# Patient Record
Sex: Female | Born: 1995
Health system: Southern US, Community
[De-identification: ages and names within clinical notes are randomized; demographics above are authoritative.]

## PROBLEM LIST (undated history)

## (undated) DIAGNOSIS — L509 Urticaria, unspecified: Secondary | ICD-10-CM

## (undated) DIAGNOSIS — Z91018 Allergy to other foods: Secondary | ICD-10-CM

## (undated) DIAGNOSIS — E739 Lactose intolerance, unspecified: Secondary | ICD-10-CM

## (undated) DIAGNOSIS — F419 Anxiety disorder, unspecified: Secondary | ICD-10-CM

## (undated) DIAGNOSIS — M549 Dorsalgia, unspecified: Secondary | ICD-10-CM

## (undated) DIAGNOSIS — L709 Acne, unspecified: Secondary | ICD-10-CM

## (undated) DIAGNOSIS — Z8349 Family history of other endocrine, nutritional and metabolic diseases: Secondary | ICD-10-CM

## (undated) DIAGNOSIS — Z803 Family history of malignant neoplasm of breast: Secondary | ICD-10-CM

## (undated) DIAGNOSIS — T783XXA Angioneurotic edema, initial encounter: Secondary | ICD-10-CM

## (undated) DIAGNOSIS — Z8489 Family history of other specified conditions: Secondary | ICD-10-CM

## (undated) DIAGNOSIS — G43909 Migraine, unspecified, not intractable, without status migrainosus: Secondary | ICD-10-CM

## (undated) DIAGNOSIS — Z800 Family history of malignant neoplasm of digestive organs: Secondary | ICD-10-CM

## (undated) DIAGNOSIS — K219 Gastro-esophageal reflux disease without esophagitis: Secondary | ICD-10-CM

## (undated) HISTORY — DX: Lactose intolerance, unspecified: E73.9

## (undated) HISTORY — PX: TONSILLECTOMY: SUR1361

## (undated) HISTORY — DX: Family history of malignant neoplasm of digestive organs: Z80.0

## (undated) HISTORY — DX: Anxiety disorder, unspecified: F41.9

## (undated) HISTORY — DX: Allergy to other foods: Z91.018

## (undated) HISTORY — DX: Dorsalgia, unspecified: M54.9

## (undated) HISTORY — DX: Urticaria, unspecified: L50.9

## (undated) HISTORY — DX: Migraine, unspecified, not intractable, without status migrainosus: G43.909

## (undated) HISTORY — DX: Acne, unspecified: L70.9

## (undated) HISTORY — DX: Family history of other endocrine, nutritional and metabolic diseases: Z83.49

## (undated) HISTORY — DX: Angioneurotic edema, initial encounter: T78.3XXA

## (undated) HISTORY — PX: NO PAST SURGERIES: SHX2092

## (undated) HISTORY — PX: WISDOM TOOTH EXTRACTION: SHX21

---
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
## 2017-01-17 ENCOUNTER — Ambulatory Visit (INDEPENDENT_AMBULATORY_CARE_PROVIDER_SITE_OTHER): Payer: Commercial Managed Care - PPO | Admitting: Physician Assistant

## 2017-01-17 ENCOUNTER — Encounter: Payer: Self-pay | Admitting: Physician Assistant

## 2017-01-17 VITALS — BP 119/76 | HR 103 | Ht 64.0 in | Wt 121.0 lb

## 2017-01-17 DIAGNOSIS — G44229 Chronic tension-type headache, not intractable: Secondary | ICD-10-CM | POA: Diagnosis not present

## 2017-01-17 DIAGNOSIS — H5462 Unqualified visual loss, left eye, normal vision right eye: Secondary | ICD-10-CM | POA: Insufficient documentation

## 2017-01-17 DIAGNOSIS — G43001 Migraine without aura, not intractable, with status migrainosus: Secondary | ICD-10-CM

## 2017-01-17 MED ORDER — RIZATRIPTAN BENZOATE 10 MG PO TABS: 10.0000 mg | ORAL_TABLET | ORAL | 0 refills | Status: DC | PRN

## 2017-01-17 NOTE — Progress Notes (Addendum)
Subjective:    Patient ID: Amanda [name], female    DOB: Dec 29, 1995, 21 y.o.   MRN: [id number]  Headache   Pertinent negatives include no eye pain, numbness, rhinorrhea, sinus pressure or weakness.    Patient is a 21yo female who presents to the clinic for recurrent and persistent headaches 7/7 days a week worsening of the past 1 month.  Patient states the pain is across her forehead and worse over both temples.  Patient denies a bandlike sensation. Patient states her headaches begin around 8am (hour after she wakes up) and continues until she goes to bed, worsening as the day goes on from a 2/10 to 9/10 pain.  She states she has tried ibuprofen and tylenol with minimal relief.  Patient states as the pain increases she experiences lightheadedness.  Patient denies waking up with a headache.  Patient states she has always had trouble sleeping and take melatonin occasionally, which does not seem to effect her headaches.   She states she has been experiencing nausea, no vomiting, with her daily headaches.  She said her appetite has decreased and she is not eating as much as she used to.  Patient denies any sinus congestion, rhinorrhea, PND, fever, or sinus pressure.  Patient states she has been becoming increasingly sensitive to noise and smells.  Denies photophobia.    Patient is concerned because 3 days ago when she was shopping she experienced an episode lasting 5 minutes where she experienced complete loss of vision in her left eye and blurred vision in her right eye.  The episode completely resolved and she felt dizzy for 30 minutes afterward.  Patient states this is the first time something like this has happened.          Review of Systems  HENT: Negative for congestion, postnasal drip, rhinorrhea, sinus pain and sinus pressure.   Eyes: Negative for pain.  Neurological: Positive for headaches. Negative for speech difficulty, weakness, light-headedness and numbness.  All other systems reviewed and  are negative.      Objective:   Physical Exam  Constitutional: She is oriented to person, place, and time. She appears well-developed and well-nourished.  HENT:  Head: Normocephalic and atraumatic.  Eyes: Conjunctivae and EOM are normal. Pupils are equal, round, and reactive to light. Right eye exhibits no discharge. Left eye exhibits no discharge.  Cardiovascular: Normal rate, regular rhythm and normal heart sounds.   Pulmonary/Chest: Effort normal and breath sounds normal.  Neurological: She is alert and oriented to person, place, and time. She has normal strength. She displays normal reflexes. No cranial nerve deficit or sensory deficit. Coordination normal.  Skin: Skin is warm and dry.  Psychiatric: She has a normal mood and affect. Her behavior is normal.  Nursing note and vitals reviewed.         Assessment & Plan:  Marland KitchenMarland KitchenLabarbara was seen today for headache.  Diagnoses and all orders for this visit:  Migraine without aura and with status migrainosus, not intractable -     rizatriptan (MAXALT) 10 MG tablet; Take 1 tablet (10 mg total) by mouth as needed for migraine. May repeat in 2 hours if needed  Chronic tension-type headache, not intractable -     rizatriptan (MAXALT) 10 MG tablet; Take 1 tablet (10 mg total) by mouth as needed for migraine. May repeat in 2 hours if needed    This patient will be referred to neurology due to the persistence of daily headaches and new monocular blindness episode.  Patient given a prescription for a rizatriptan 10mg  to take for severe headaches.  Instructed patient to not take these for daily tension headaches.  Side effects explained.    An order was made for brain MRI with and without contrast due to her persistent daily headaches and new temporary monocular blindness.  Patient can follow-up as needed.

## 2017-01-17 NOTE — Addendum Note (Signed)
Addended by: Donella Stade on: 01/17/2017 04:44 PM   Modules accepted: Orders, Level of Service

## 2017-01-29 ENCOUNTER — Ambulatory Visit (INDEPENDENT_AMBULATORY_CARE_PROVIDER_SITE_OTHER): Payer: Commercial Managed Care - PPO

## 2017-01-29 DIAGNOSIS — G43001 Migraine without aura, not intractable, with status migrainosus: Secondary | ICD-10-CM

## 2017-01-29 MED ORDER — GADOBENATE DIMEGLUMINE 529 MG/ML IV SOLN
10.0000 mL | Freq: Once | INTRAVENOUS | Status: AC | PRN
  Administered 2017-01-29: 10 mL via INTRAVENOUS

## 2017-01-29 NOTE — Progress Notes (Signed)
Call pt: MRI looks great. Do you have neurologist appt yet?

## 2017-02-09 ENCOUNTER — Encounter: Payer: Self-pay | Admitting: Neurology

## 2017-02-09 ENCOUNTER — Ambulatory Visit (INDEPENDENT_AMBULATORY_CARE_PROVIDER_SITE_OTHER): Payer: Commercial Managed Care - PPO | Admitting: Neurology

## 2017-02-09 VITALS — BP 114/66 | HR 93 | Resp 20 | Ht 64.0 in | Wt 121.0 lb

## 2017-02-09 DIAGNOSIS — R51 Headache: Secondary | ICD-10-CM | POA: Diagnosis not present

## 2017-02-09 DIAGNOSIS — G43709 Chronic migraine without aura, not intractable, without status migrainosus: Secondary | ICD-10-CM

## 2017-02-09 DIAGNOSIS — R519 Headache, unspecified: Secondary | ICD-10-CM

## 2017-02-09 MED ORDER — AMITRIPTYLINE HCL 10 MG PO TABS: 10.0000 mg | ORAL_TABLET | Freq: Every day | ORAL | 6 refills | Status: DC

## 2017-02-09 NOTE — Progress Notes (Signed)
[name]    Provider:  Dr Jaynee Eagles Referring Provider: Lavada Mesi Primary Care Physician:  Iran Planas, PA-C  CC: headache  HPI:  Amanda [name] is a 21 y.o. female here as a referral from Dr. Alden Hipp for headaches. She has had them for about a year. No inciting event or head trauma. Mother with chronic migraines. She wakes with headaches which are mild and get worse throughout the day. They are behind the eyes and in the frontal areas and radiates to the temples, shooting pain which is aggravated by activity. They are pulsating, throbbing, with light sensitivity and moreso sound sensitivity. Nause no vomiting. They last all day long. They have been daily for 3-4 months, worsened several months ago. She has tightness and neck pain. She has insomnia, troubling initiating sleep. She tries to drink water during the day but it is difficult because she is so busy. Stress makes the headaches worse. Ibuprofen and OTC meds don't work. No medication overuse. No other focal neurologic deficits, associated symptoms, inciting events or modifiable factors.  Reviewed notes, labs and imaging from outside physicians, which showed:  Personally reviewed MRI of the brain images, normal MRI of the brain with contrast.  Review of Systems: Patient complains of symptoms per HPI as well as the following symptoms: Weight loss,t pain, headache, dizziness, sleepiness,. Pertinent negatives per HPI. All others negative.   Social History   Social History  . Marital status: Single    Spouse name: N/A  . Number of children: N/A  . Years of education: N/A   Occupational History  . Not on file.   Social History Main Topics  . Smoking status: Never Smoker  . Smokeless tobacco: Never Used  . Alcohol use No  . Drug use: No  . Sexual activity: No   Other Topics Concern  . Not on file   Social History Narrative  . No narrative on file    Family History  Problem Relation Age of  Onset  . Migraines Mother   . Skin cancer Father   . Asthma Maternal Grandmother   . Hypertension Maternal Grandmother   . Thyroid disease Maternal Grandmother   . Hypertension Maternal Grandfather   . Hypertension Paternal Grandmother   . Diabetes Paternal Grandmother   . Hypertension Paternal Grandfather   . Diabetes Paternal Grandfather   . Thyroid disease Maternal Aunt     Past Medical History:  Diagnosis Date  . Migraine     Past Surgical History:  Procedure Laterality Date  . NO PAST SURGERIES      Current Outpatient Prescriptions  Medication Sig Dispense Refill  . Adapalene-Benzoyl Peroxide 0.1-2.5 % gel Apply one application to affected areas at bedtime. 45 g 1  . drospirenone-ethinyl estradiol (YAZ,GIANVI,LORYNA) 3-0.02 MG tablet Take 1 tablet by mouth daily. 3 Package 4  . rizatriptan (MAXALT) 10 MG tablet Take 1 tablet (10 mg total) by mouth as needed for migraine. May repeat in 2 hours if needed 10 tablet 0  . amitriptyline (ELAVIL) 10 MG tablet Take 1 tablet (10 mg total) by mouth at bedtime. 30 tablet 6   No current facility-administered medications for this visit.     Allergies as of 02/09/2017 - Review Complete 02/09/2017  Allergen Reaction Noted  . Imitrex [sumatriptan] Swelling 08/15/2016  . Penicillins  03/09/2011    Vitals: BP 114/66   Pulse 93   Resp 20   Ht 5\' 4"  (1.626 m)   Wt 54.9 kg (121 lb)  LMP 01/29/2017   BMI 20.77 kg/m  Last Weight:  Wt Readings from Last 1 Encounters:  02/09/17 54.9 kg (121 lb) (39 %, Z= -0.27)*   * Growth percentiles are based on CDC 2-20 Years data.   Last Height:   Ht Readings from Last 1 Encounters:  02/09/17 5\' 4"  (1.626 m) (46 %, Z= -0.11)*   * Growth percentiles are based on CDC 2-20 Years data.     Physical exam: Exam: Gen: NAD, conversant, well nourised, well groomed                     CV: RRR, no MRG. No Carotid Bruits. No peripheral edema, warm, nontender Eyes: Conjunctivae clear without  exudates or hemorrhage  Neuro: Detailed Neurologic Exam  Speech:    Speech is normal; fluent and spontaneous with normal comprehension.  Cognition:    The patient is oriented to person, place, and time;     recent and remote memory intact;     language fluent;     normal attention, concentration,     fund of knowledge Cranial Nerves:    The pupils are equal, round, and reactive to light. The fundi are normal and spontaneous venous pulsations are present. Visual fields are full to finger confrontation. Extraocular movements are intact. Trigeminal sensation is intact and the muscles of mastication are normal. The face is symmetric. The palate elevates in the midline. Hearing intact. Voice is normal. Shoulder shrug is normal. The tongue has normal motion without fasciculations.   Coordination:    Normal finger to nose and heel to shin. Normal rapid alternating movements.   Gait:    Heel-toe and tandem gait are normal.   Motor Observation:    No asymmetry, no atrophy, and no involuntary movements noted. Tone:    Normal muscle tone.    Posture:    Posture is normal. normal erect    Strength:    Strength is V/V in the upper and lower limbs.      Sensation: intact to LT     Reflex Exam:  DTR's:    Deep tendon reflexes in the upper and lower extremities are normal bilaterally.   Toes:    The toes are downgoing bilaterally.   Clonus:    Clonus is absent.     Assessment/Plan:  Patient with daily migraines and insomnia.   Remember to drink plenty of fluid, eat healthy meals and do not skip any meals. Try to eat protein with a every meal and eat a healthy snack such as fruit or nuts in between meals. Try to keep a regular sleep-wake schedule and try to exercise daily, particularly in the form of walking, 20-30 minutes a day, if you can.   As far as your medications are concerned, I would like to suggest: Amitriptyline at bedtime for migraines and insomnia. We can adjust,  instructed patient to email me in 4 weeks for progress.   Massage and heat for neck pain (she is in school to become a massage therapist)  As far as diagnostic testing: Labs today  Discussed the following:   To prevent or relieve headaches, try the following: Cool Compress. Lie down and place a cool compress on your head.  Avoid headache triggers. If certain foods or odors seem to have triggered your migraines in the past, avoid them. A headache diary might help you identify triggers.  Include physical activity in your daily routine. Try a daily walk or other moderate aerobic exercise.  Manage stress. Find healthy ways to cope with the stressors, such as delegating tasks on your to-do list.  Practice relaxation techniques. Try deep breathing, yoga, massage and visualization.  Eat regularly. Eating regularly scheduled meals and maintaining a healthy diet might help prevent headaches. Also, drink plenty of fluids.  Follow a regular sleep schedule. Sleep deprivation might contribute to headaches Consider biofeedback. With this mind-body technique, you learn to control certain bodily functions - such as muscle tension, heart rate and blood pressure - to prevent headaches or reduce headache pain.    Proceed to emergency room if you experience new or worsening symptoms or symptoms do not resolve, if you have new neurologic symptoms or if headache is severe, or for any concerning symptom.   Cc: Iran Planas, PA-C  Sarina Ill, MD  [facility] [address] [place], [place] [address]  Phone [phone] Fax [phone]

## 2017-02-09 NOTE — Patient Instructions (Signed)
Remember to drink plenty of fluid, eat healthy meals and do not skip any meals. Try to eat protein with a every meal and eat a healthy snack such as fruit or nuts in between meals. Try to keep a regular sleep-wake schedule and try to exercise daily, particularly in the form of walking, 20-30 minutes a day, if you can.   As far as your medications are concerned, I would like to suggest: Amitriptyline at bedtime  As far as diagnostic testing: Labs  I would like to see you back in 4 months, sooner if we need to. Please call us with any interim questions, concerns, problems, updates or refill requests.   Our phone number is [phone]. We also have an after hours call service for urgent matters and there is a physician on-call for urgent questions. For any emergencies you know to call 911 or go to the nearest emergency room  Amitriptyline tablets What is this medicine? AMITRIPTYLINE (a mee TRIP ti leen) is used to treat migraines and headache. This medicine may be used for other purposes; ask your health care provider or pharmacist if you have questions. COMMON BRAND NAME(S): Elavil, Vanatrip What should I tell my health care provider before I take this medicine? They need to know if you have any of these conditions: -an alcohol problem -asthma, difficulty breathing -bipolar disorder or schizophrenia -difficulty passing urine, prostate trouble -glaucoma -heart disease or previous heart attack -liver disease -over active thyroid -seizures -thoughts or plans of suicide, a previous suicide attempt, or family history of suicide attempt -an unusual or allergic reaction to amitriptyline, other medicines, foods, dyes, or preservatives -pregnant or trying to get pregnant -breast-feeding How should I use this medicine? Take this medicine by mouth with a drink of water. Follow the directions on the prescription label. You can take the tablets with or without food. Take your medicine at regular  intervals. Do not take it more often than directed. Do not stop taking this medicine suddenly except upon the advice of your doctor. Stopping this medicine too quickly may cause serious side effects or your condition may worsen. A special MedGuide will be given to you by the pharmacist with each prescription and refill. Be sure to read this information carefully each time. Talk to your pediatrician regarding the use of this medicine in children. Special care may be needed. Overdosage: If you think you have taken too much of this medicine contact a poison control center or emergency room at once. NOTE: This medicine is only for you. Do not share this medicine with others. What if I miss a dose? If you miss a dose, take it as soon as you can. If it is almost time for your next dose, take only that dose. Do not take double or extra doses. What may interact with this medicine? Do not take this medicine with any of the following medications: -arsenic trioxide -certain medicines used to regulate abnormal heartbeat or to treat other heart conditions -cisapride -droperidol -halofantrine -linezolid -MAOIs like Carbex, Eldepryl, Marplan, Nardil, and Parnate -methylene blue -other medicines for mental depression -phenothiazines like perphenazine, thioridazine and chlorpromazine -pimozide -probucol -procarbazine -sparfloxacin -St. John's Wort -ziprasidone This medicine may also interact with the following medications: -atropine and related drugs like hyoscyamine, scopolamine, tolterodine and others -barbiturate medicines for inducing sleep or treating seizures, like phenobarbital -cimetidine -disulfiram -ethchlorvynol -thyroid hormones such as levothyroxine This list may not describe all possible interactions. Give your health care provider a list of all the medicines,  herbs, non-prescription drugs, or dietary supplements you use. Also tell them if you smoke, drink alcohol, or use illegal drugs.  Some items may interact with your medicine. What should I watch for while using this medicine? Tell your doctor if your symptoms do not get better or if they get worse. Visit your doctor or health care professional for regular checks on your progress. Because it may take several weeks to see the full effects of this medicine, it is important to continue your treatment as prescribed by your doctor. Patients and their families should watch out for new or worsening thoughts of suicide or depression. Also watch out for sudden changes in feelings such as feeling anxious, agitated, panicky, irritable, hostile, aggressive, impulsive, severely restless, overly excited and hyperactive, or not being able to sleep. If this happens, especially at the beginning of treatment or after a change in dose, call your health care professional. Dennis Bast may get drowsy or dizzy. Do not drive, use machinery, or do anything that needs mental alertness until you know how this medicine affects you. Do not stand or sit up quickly, especially if you are an older patient. This reduces the risk of dizzy or fainting spells. Alcohol may interfere with the effect of this medicine. Avoid alcoholic drinks. Do not treat yourself for coughs, colds, or allergies without asking your doctor or health care professional for advice. Some ingredients can increase possible side effects. Your mouth may get dry. Chewing sugarless gum or sucking hard candy, and drinking plenty of water will help. Contact your doctor if the problem does not go away or is severe. This medicine may cause dry eyes and blurred vision. If you wear contact lenses you may feel some discomfort. Lubricating drops may help. See your eye doctor if the problem does not go away or is severe. This medicine can cause constipation. Try to have a bowel movement at least every 2 to 3 days. If you do not have a bowel movement for 3 days, call your doctor or health care professional. This medicine  can make you more sensitive to the sun. Keep out of the sun. If you cannot avoid being in the sun, wear protective clothing and use sunscreen. Do not use sun lamps or tanning beds/booths. What side effects may I notice from receiving this medicine? Side effects that you should report to your doctor or health care professional as soon as possible: -allergic reactions like skin rash, itching or hives, swelling of the face, lips, or tongue -anxious -breathing problems -changes in vision -confusion -elevated mood, decreased need for sleep, racing thoughts, impulsive behavior -eye pain -fast, irregular heartbeat -feeling faint or lightheaded, falls -feeling agitated, angry, or irritable -fever with increased sweating -hallucination, loss of contact with reality -seizures -stiff muscles -suicidal thoughts or other mood changes -tingling, pain, or numbness in the feet or hands -trouble passing urine or change in the amount of urine -trouble sleeping -unusually weak or tired -vomiting -yellowing of the eyes or skin Side effects that usually do not require medical attention (report to your doctor or health care professional if they continue or are bothersome): -change in sex drive or performance -change in appetite or weight -constipation -dizziness -dry mouth -nausea -tired -tremors -upset stomach This list may not describe all possible side effects. Call your doctor for medical advice about side effects. You may report side effects to FDA at 1-800-FDA-1088. Where should I keep my medicine? Keep out of the reach of children. Store at room temperature between  20 and 25 degrees C (68 and 77 degrees F). Throw away any unused medicine after the expiration date. NOTE: This sheet is a summary. It may not cover all possible information. If you have questions about this medicine, talk to your doctor, pharmacist, or health care provider.  2017 Elsevier/Gold Standard (2016-05-05 12:14:15)

## 2017-02-10 LAB — COMPREHENSIVE METABOLIC PANEL
A/G RATIO: 1.9 (ref 1.2–2.2)
ALT: 6 IU/L (ref 0–32)
AST: 12 IU/L (ref 0–40)
Albumin: 4.3 g/dL (ref 3.5–5.5)
Alkaline Phosphatase: 39 IU/L — ABNORMAL LOW (ref 43–101)
BUN/Creatinine Ratio: 15 (ref 9–23)
BUN: 10 mg/dL (ref 6–20)
Bilirubin Total: 0.4 mg/dL (ref 0.0–1.2)
CALCIUM: 9.3 mg/dL (ref 8.7–10.2)
CO2: 24 mmol/L (ref 18–29)
Chloride: 104 mmol/L (ref 96–106)
Creatinine, Ser: 0.68 mg/dL (ref 0.57–1.00)
GFR, EST AFRICAN AMERICAN: 148 mL/min/{1.73_m2} (ref >59)
GFR, EST NON AFRICAN AMERICAN: 128 mL/min/{1.73_m2} (ref >59)
GLOBULIN, TOTAL: 2.3 g/dL (ref 1.5–4.5)
Glucose: 78 mg/dL (ref 65–99)
POTASSIUM: 4.4 mmol/L (ref 3.5–5.2)
SODIUM: 142 mmol/L (ref 134–144)
TOTAL PROTEIN: 6.6 g/dL (ref 6.0–8.5)

## 2017-02-10 LAB — CBC
HEMOGLOBIN: 12.9 g/dL (ref 11.1–15.9)
Hematocrit: 38.7 % (ref 34.0–46.6)
MCH: 29.5 pg (ref 26.6–33.0)
MCHC: 33.3 g/dL (ref 31.5–35.7)
MCV: 88 fL (ref 79–97)
PLATELETS: 216 10*3/uL (ref 150–379)
RBC: 4.38 x10E6/uL (ref 3.77–5.28)
RDW: 13 % (ref 12.3–15.4)
WBC: 4.1 10*3/uL (ref 3.4–10.8)

## 2017-02-11 ENCOUNTER — Encounter: Payer: Self-pay | Admitting: Neurology

## 2017-02-11 DIAGNOSIS — G43709 Chronic migraine without aura, not intractable, without status migrainosus: Secondary | ICD-10-CM | POA: Insufficient documentation

## 2017-02-11 DIAGNOSIS — G43711 Chronic migraine without aura, intractable, with status migrainosus: Secondary | ICD-10-CM | POA: Insufficient documentation

## 2017-02-13 ENCOUNTER — Ambulatory Visit: Payer: Commercial Managed Care - PPO | Admitting: Neurology

## 2017-02-14 ENCOUNTER — Telehealth: Payer: Self-pay | Admitting: *Deleted

## 2017-02-14 NOTE — Telephone Encounter (Signed)
Per Dr Jaynee Eagles, LVM informing patient her labs look fine. Left number for any questions.

## 2017-04-16 ENCOUNTER — Encounter: Payer: Self-pay | Admitting: Physician Assistant

## 2017-04-16 ENCOUNTER — Ambulatory Visit (INDEPENDENT_AMBULATORY_CARE_PROVIDER_SITE_OTHER): Payer: Commercial Managed Care - PPO

## 2017-04-16 ENCOUNTER — Ambulatory Visit (INDEPENDENT_AMBULATORY_CARE_PROVIDER_SITE_OTHER): Payer: Commercial Managed Care - PPO | Admitting: Physician Assistant

## 2017-04-16 VITALS — BP 115/75 | HR 103 | Ht 64.0 in | Wt 128.0 lb

## 2017-04-16 DIAGNOSIS — R229 Localized swelling, mass and lump, unspecified: Secondary | ICD-10-CM

## 2017-04-16 DIAGNOSIS — R221 Localized swelling, mass and lump, neck: Secondary | ICD-10-CM

## 2017-04-16 NOTE — Progress Notes (Signed)
   Subjective:    Patient ID: Amanda [name], female    DOB: 21-Jun-1996, 21 y.o.   MRN: [id number]  HPI Pt is a 21 yo female who presents to the clinic with lump on the back of her neck left side. Noticed for last month. Seemed to be bigger when she first noticed it. Lately seems to be causing more pain with ROM of neck and laying down. No cough, fever, chills, n/v/d. Thought it was a pimple at first but then never came to a head.    Review of Systems  All other systems reviewed and are negative.      Objective:   Physical Exam  Constitutional: She appears well-developed and well-nourished.  Skin:  Pea sized nodule left side of neck just under hairline. Slightly tender with deep palpation.  No swelling, redness, warmth. Can not see lump only feel it right under the skin. Very mobile.   Psychiatric: She has a normal mood and affect. Her behavior is normal.          Assessment & Plan:  Marland KitchenMarland KitchenDiagnoses and all orders for this visit:  Skin nodule -     US SOFT TISSUE HEAD AND NECK; Future -     US SOFT TISSUE HEAD AND NECK   Unclear etiology lymph node vs cyst vs lipoma. Reassured patient I do not feel like anything pathological. Encouraged cool compresses, ibuprofen and keeping her hands off it.  Follow up as needed.

## 2017-05-10 ENCOUNTER — Ambulatory Visit (INDEPENDENT_AMBULATORY_CARE_PROVIDER_SITE_OTHER): Payer: Commercial Managed Care - PPO | Admitting: Neurology

## 2017-05-10 ENCOUNTER — Encounter: Payer: Self-pay | Admitting: Neurology

## 2017-05-10 VITALS — BP 103/56 | HR 88 | Ht 64.0 in | Wt 128.4 lb

## 2017-05-10 DIAGNOSIS — G43009 Migraine without aura, not intractable, without status migrainosus: Secondary | ICD-10-CM

## 2017-05-10 MED ORDER — ONDANSETRON 4 MG PO TBDP: 4.0000 mg | ORAL_TABLET | Freq: Three times a day (TID) | ORAL | 6 refills | Status: DC | PRN

## 2017-05-10 MED ORDER — AMITRIPTYLINE HCL 25 MG PO TABS: 25.0000 mg | ORAL_TABLET | Freq: Every day | ORAL | 12 refills | Status: DC

## 2017-05-10 MED ORDER — KETOROLAC TROMETHAMINE 60 MG/2ML IM SOLN
60.0000 mg | Freq: Once | INTRAMUSCULAR | Status: AC
  Administered 2017-05-10: 60 mg via INTRAMUSCULAR

## 2017-05-10 NOTE — Patient Instructions (Addendum)
Overall you are doing fairly well but I do want to suggest a few things today:   Remember to drink plenty of fluid, eat healthy meals and do not skip any meals. Try to eat protein with a every meal and eat a healthy snack such as fruit or nuts in between meals. Try to keep a regular sleep-wake schedule and try to exercise daily, particularly in the form of walking, 20-30 minutes a day, if you can.   As far as your medications are concerned, I would like to suggest:  Increase amitriptyline to 25 mg at bedtime At onset of headache take Maxalt and Zofran. May repeat once in 2 hours if needed.  Our phone number is [phone]. We also have an after hours call service for urgent matters and there is a physician on-call for urgent questions. For any emergencies you know to call 911 or go to the nearest emergency room Amitriptyline tablets What is this medicine? AMITRIPTYLINE (a mee TRIP ti leen) is used to treat depression. This medicine may be used for other purposes; ask your health care provider or pharmacist if you have questions. COMMON BRAND NAME(S): Elavil, Vanatrip What should I tell my health care provider before I take this medicine? They need to know if you have any of these conditions: -an alcohol problem -asthma, difficulty breathing -bipolar disorder or schizophrenia -difficulty passing urine, prostate trouble -glaucoma -heart disease or previous heart attack -liver disease -over active thyroid -seizures -thoughts or plans of suicide, a previous suicide attempt, or family history of suicide attempt -an unusual or allergic reaction to amitriptyline, other medicines, foods, dyes, or preservatives -pregnant or trying to get pregnant -breast-feeding How should I use this medicine? Take this medicine by mouth with a drink of water. Follow the directions on the prescription label. You can take the tablets with or without food. Take your medicine at regular intervals. Do not take it  more often than directed. Do not stop taking this medicine suddenly except upon the advice of your doctor. Stopping this medicine too quickly may cause serious side effects or your condition may worsen. A special MedGuide will be given to you by the pharmacist with each prescription and refill. Be sure to read this information carefully each time. Talk to your pediatrician regarding the use of this medicine in children. Special care may be needed. Overdosage: If you think you have taken too much of this medicine contact a poison control center or emergency room at once. NOTE: This medicine is only for you. Do not share this medicine with others. What if I miss a dose? If you miss a dose, take it as soon as you can. If it is almost time for your next dose, take only that dose. Do not take double or extra doses. What may interact with this medicine? Do not take this medicine with any of the following medications: -arsenic trioxide -certain medicines used to regulate abnormal heartbeat or to treat other heart conditions -cisapride -droperidol -halofantrine -linezolid -MAOIs like Carbex, Eldepryl, Marplan, Nardil, and Parnate -methylene blue -other medicines for mental depression -phenothiazines like perphenazine, thioridazine and chlorpromazine -pimozide -probucol -procarbazine -sparfloxacin -St. John's Wort -ziprasidone This medicine may also interact with the following medications: -atropine and related drugs like hyoscyamine, scopolamine, tolterodine and others -barbiturate medicines for inducing sleep or treating seizures, like phenobarbital -cimetidine -disulfiram -ethchlorvynol -thyroid hormones such as levothyroxine This list may not describe all possible interactions. Give your health care provider a list of all the medicines, herbs,  non-prescription drugs, or dietary supplements you use. Also tell them if you smoke, drink alcohol, or use illegal drugs. Some items may interact  with your medicine. What should I watch for while using this medicine? Tell your doctor if your symptoms do not get better or if they get worse. Visit your doctor or health care professional for regular checks on your progress. Because it may take several weeks to see the full effects of this medicine, it is important to continue your treatment as prescribed by your doctor. Patients and their families should watch out for new or worsening thoughts of suicide or depression. Also watch out for sudden changes in feelings such as feeling anxious, agitated, panicky, irritable, hostile, aggressive, impulsive, severely restless, overly excited and hyperactive, or not being able to sleep. If this happens, especially at the beginning of treatment or after a change in dose, call your health care professional. Dennis Bast may get drowsy or dizzy. Do not drive, use machinery, or do anything that needs mental alertness until you know how this medicine affects you. Do not stand or sit up quickly, especially if you are an older patient. This reduces the risk of dizzy or fainting spells. Alcohol may interfere with the effect of this medicine. Avoid alcoholic drinks. Do not treat yourself for coughs, colds, or allergies without asking your doctor or health care professional for advice. Some ingredients can increase possible side effects. Your mouth may get dry. Chewing sugarless gum or sucking hard candy, and drinking plenty of water will help. Contact your doctor if the problem does not go away or is severe. This medicine may cause dry eyes and blurred vision. If you wear contact lenses you may feel some discomfort. Lubricating drops may help. See your eye doctor if the problem does not go away or is severe. This medicine can cause constipation. Try to have a bowel movement at least every 2 to 3 days. If you do not have a bowel movement for 3 days, call your doctor or health care professional. This medicine can make you more  sensitive to the sun. Keep out of the sun. If you cannot avoid being in the sun, wear protective clothing and use sunscreen. Do not use sun lamps or tanning beds/booths. What side effects may I notice from receiving this medicine? Side effects that you should report to your doctor or health care professional as soon as possible: -allergic reactions like skin rash, itching or hives, swelling of the face, lips, or tongue -anxious -breathing problems -changes in vision -confusion -elevated mood, decreased need for sleep, racing thoughts, impulsive behavior -eye pain -fast, irregular heartbeat -feeling faint or lightheaded, falls -feeling agitated, angry, or irritable -fever with increased sweating -hallucination, loss of contact with reality -seizures -stiff muscles -suicidal thoughts or other mood changes -tingling, pain, or numbness in the feet or hands -trouble passing urine or change in the amount of urine -trouble sleeping -unusually weak or tired -vomiting -yellowing of the eyes or skin Side effects that usually do not require medical attention (report to your doctor or health care professional if they continue or are bothersome): -change in sex drive or performance -change in appetite or weight -constipation -dizziness -dry mouth -nausea -tired -tremors -upset stomach This list may not describe all possible side effects. Call your doctor for medical advice about side effects. You may report side effects to FDA at 1-800-FDA-1088. Where should I keep my medicine? Keep out of the reach of children. Store at room temperature between 20  and 25 degrees C (68 and 77 degrees F). Throw away any unused medicine after the expiration date. NOTE: This sheet is a summary. It may not cover all possible information. If you have questions about this medicine, talk to your doctor, pharmacist, or health care provider.  2018 [employer] Standard (2016-05-05 12:14:15)  Ondansetron  tablets What is this medicine? ONDANSETRON (on DAN se tron) is used to treat nausea and vomiting caused by chemotherapy. It is also used to prevent or treat nausea and vomiting after surgery. This medicine may be used for other purposes; ask your health care provider or pharmacist if you have questions. COMMON BRAND NAME(S): Zofran What should I tell my health care provider before I take this medicine? They need to know if you have any of these conditions: -heart disease -history of irregular heartbeat -liver disease -low levels of magnesium or potassium in the blood -an unusual or allergic reaction to ondansetron, granisetron, other medicines, foods, dyes, or preservatives -pregnant or trying to get pregnant -breast-feeding How should I use this medicine? Take this medicine by mouth with a glass of water. Follow the directions on your prescription label. Take your doses at regular intervals. Do not take your medicine more often than directed. Talk to your pediatrician regarding the use of this medicine in children. Special care may be needed. Overdosage: If you think you have taken too much of this medicine contact a poison control center or emergency room at once. NOTE: This medicine is only for you. Do not share this medicine with others. What if I miss a dose? If you miss a dose, take it as soon as you can. If it is almost time for your next dose, take only that dose. Do not take double or extra doses. What may interact with this medicine? Do not take this medicine with any of the following medications: -apomorphine -certain medicines for fungal infections like fluconazole, itraconazole, ketoconazole, posaconazole, voriconazole -cisapride -dofetilide -dronedarone -pimozide -thioridazine -ziprasidone This medicine may also interact with the following medications: -carbamazepine -certain medicines for depression, anxiety, or psychotic disturbances -fentanyl -linezolid -MAOIs  like Carbex, Eldepryl, Marplan, Nardil, and Parnate -methylene blue (injected into a vein) -other medicines that prolong the QT interval (cause an abnormal heart rhythm) -phenytoin -rifampicin -tramadol This list may not describe all possible interactions. Give your health care provider a list of all the medicines, herbs, non-prescription drugs, or dietary supplements you use. Also tell them if you smoke, drink alcohol, or use illegal drugs. Some items may interact with your medicine. What should I watch for while using this medicine? Check with your doctor or health care professional right away if you have any sign of an allergic reaction. What side effects may I notice from receiving this medicine? Side effects that you should report to your doctor or health care professional as soon as possible: -allergic reactions like skin rash, itching or hives, swelling of the face, lips or tongue -breathing problems -confusion -dizziness -fast or irregular heartbeat -feeling faint or lightheaded, falls -fever and chills -loss of balance or coordination -seizures -sweating -swelling of the hands or feet -tightness in the chest -tremors -unusually weak or tired Side effects that usually do not require medical attention (report to your doctor or health care professional if they continue or are bothersome): -constipation or diarrhea -headache This list may not describe all possible side effects. Call your doctor for medical advice about side effects. You may report side effects to FDA at 1-800-FDA-1088. Where should I keep  my medicine? Keep out of the reach of children. Store between 2 and 30 degrees C (36 and 86 degrees F). Throw away any unused medicine after the expiration date. NOTE: This sheet is a summary. It may not cover all possible information. If you have questions about this medicine, talk to your doctor, pharmacist, or health care provider.  2018 [employer]/Gold Standard (2013-09-10  16:27:45)  Rizatriptan tablets What is this medicine? RIZATRIPTAN (rye za TRIP tan) is used to treat migraines with or without aura. An aura is a strange feeling or visual disturbance that warns you of an attack. It is not used to prevent migraines. This medicine may be used for other purposes; ask your health care provider or pharmacist if you have questions. COMMON BRAND NAME(S): Maxalt What should I tell my health care provider before I take this medicine? They need to know if you have any of these conditions: -bowel disease or colitis -diabetes -family history of heart disease -fast or irregular heart beat -heart or blood vessel disease, angina (chest pain), or previous heart attack -high blood pressure -high cholesterol -history of stroke, transient ischemic attacks (TIAs or mini-strokes), or intracranial bleeding -kidney or liver disease -overweight -poor circulation -postmenopausal or surgical removal of uterus and ovaries -Raynaud's disease -seizure disorder -an unusual or allergic reaction to rizatriptan, other medicines, foods, dyes, or preservatives -pregnant or trying to get pregnant -breast-feeding How should I use this medicine? This medicine is taken by mouth with a glass of water. Follow the directions on the prescription label. This medicine is taken at the first symptoms of a migraine. It is not for everyday use. If your migraine headache returns after one dose, you can take another dose as directed. You must leave at least 2 hours between doses, and do not take more than 30 mg total in 24 hours. If there is no improvement at all after the first dose, do not take a second dose without talking to your doctor or health care professional. Do not take your medicine more often than directed. Talk to your pediatrician regarding the use of this medicine in children. While this drug may be prescribed for children as young as 6 years for selected conditions, precautions do  apply. Overdosage: If you think you have taken too much of this medicine contact a poison control center or emergency room at once. NOTE: This medicine is only for you. Do not share this medicine with others. What if I miss a dose? This does not apply; this medicine is not for regular use. What may interact with this medicine? Do not take this medicine with any of the following medicines: -amphetamine, dextroamphetamine or cocaine -dihydroergotamine, ergotamine, ergoloid mesylates, methysergide, or ergot-type medication - do not take within 24 hours of taking rizatriptan -feverfew -MAOIs like Carbex, Eldepryl, Marplan, Nardil, and Parnate - do not take rizatriptan within 2 weeks of stopping MAOI therapy. -other migraine medicines like almotriptan, eletriptan, naratriptan, sumatriptan, zolmitriptan - do not take within 24 hours of taking rizatriptan -tryptophan This medicine may also interact with the following medications: -medicines for mental depression, anxiety or mood problems -propranolol This list may not describe all possible interactions. Give your health care provider a list of all the medicines, herbs, non-prescription drugs, or dietary supplements you use. Also tell them if you smoke, drink alcohol, or use illegal drugs. Some items may interact with your medicine. What should I watch for while using this medicine? Only take this medicine for a migraine headache. Take it if  you get warning symptoms or at the start of a migraine attack. It is not for regular use to prevent migraine attacks. You may get drowsy or dizzy. Do not drive, use machinery, or do anything that needs mental alertness until you know how this medicine affects you. To reduce dizzy or fainting spells, do not sit or stand up quickly, especially if you are an older patient. Alcohol can increase drowsiness, dizziness and flushing. Avoid alcoholic drinks. Smoking cigarettes may increase the risk of heart-related side  effects from using this medicine. If you take migraine medicines for 10 or more days a month, your migraines may get worse. Keep a diary of headache days and medicine use. Contact your healthcare professional if your migraine attacks occur more frequently. What side effects may I notice from receiving this medicine? Side effects that you should report to your doctor or health care professional as soon as possible: -allergic reactions like skin rash, itching or hives, swelling of the face, lips, or tongue -fast, slow, or irregular heart beat -increased or decreased blood pressure -seizures -severe stomach pain and cramping, bloody diarrhea -signs and symptoms of a blood clot such as breathing problems; changes in vision; chest pain; severe, sudden headache; pain, swelling, warmth in the leg; trouble speaking; sudden numbness or weakness of the face, arm or leg -tingling, pain, or numbness in the face, hands, or feet Side effects that usually do not require medical attention (report to your doctor or health care professional if they continue or are bothersome): -drowsiness -dry mouth -feeling warm, flushing, or redness of the face -headache -muscle cramps, pain -nausea, vomiting -unusually weak or tired This list may not describe all possible side effects. Call your doctor for medical advice about side effects. You may report side effects to FDA at 1-800-FDA-1088. Where should I keep my medicine? Keep out of the reach of children. Store at room temperature between 15 and 30 degrees C (59 and 86 degrees F). Keep container tightly closed. Throw away any unused medicine after the expiration date. NOTE: This sheet is a summary. It may not cover all possible information. If you have questions about this medicine, talk to your doctor, pharmacist, or health care provider.  2018 [employer] Standard (2013-08-05 10:16:39)

## 2017-05-10 NOTE — Addendum Note (Signed)
Addended by: Monte Fantasia on: 05/10/2017 09:24 AM   Modules accepted: Orders

## 2017-05-10 NOTE — Progress Notes (Signed)
[name]    Provider:  Dr Jaynee Eagles Referring Provider: Lavada Mesi Primary Care Physician:  Donella Stade, PA-C  CC: headache  Interval history 05/10/2017: Patient is here for follow up.  She was started on amitriptyline at last appointment for migraines as well as insomnia.Amitriptyline helped her sleep. Got better in February and March. Back to daily headaches. Discussed increasing Amitriptyline. Discussed not to take OTC medication. She has daily headaches and 12 are migrainous. There is stress in her life per boyfriend. Happen more in the late afternoon. Discussed medication overuse, do not take Tylenol or ibuprofen or Maxalt more than 10 times in a month or 2 days in a week. Also discussed other treatment such as topiramate. At this point we'll increase the amitriptyline. Discussed lifestyle and nonpharmacologic methods of treating migraines.  HPI 02/09/2017:  Amanda [name] is a 21 y.o. female here as a referral from Dr. Alden Hipp for headaches. She has had them for about a year. No inciting event or head trauma. Mother with chronic migraines. She wakes with headaches which are mild and get worse throughout the day. They are behind the eyes and in the frontal areas and radiates to the temples, shooting pain which is aggravated by activity. They are pulsating, throbbing, with light sensitivity and moreso sound sensitivity. Nause no vomiting. They last all day long. They have been daily for 3-4 months, worsened several months ago. She has tightness and neck pain. She has insomnia, troubling initiating sleep. She tries to drink water during the day but it is difficult because she is so busy. Stress makes the headaches worse. Ibuprofen and OTC meds don't work. No medication overuse. No other focal neurologic deficits, associated symptoms, inciting events or modifiable factors.  Reviewed notes, labs and imaging from outside physicians, which showed:  Personally  reviewed MRI of the brain images, normal MRI of the brain with contrast.  Review of Systems: Patient complains of symptoms per HPI as well as the following symptoms: Weight loss,t pain, headache, dizziness, sleepiness,. Pertinent negatives per HPI. All others negative.    Social History   Social History  . Marital status: Single    Spouse name: N/A  . Number of children: N/A  . Years of education: N/A   Occupational History  . Not on file.   Social History Main Topics  . Smoking status: Never Smoker  . Smokeless tobacco: Never Used  . Alcohol use No  . Drug use: No  . Sexual activity: No   Other Topics Concern  . Not on file   Social History Narrative   Lives at home w/ her parents   Right-handed   Caffeine: occasional coffee    Family History  Problem Relation Age of Onset  . Migraines Mother   . Skin cancer Father   . Asthma Maternal Grandmother   . Hypertension Maternal Grandmother   . Thyroid disease Maternal Grandmother   . Hypertension Maternal Grandfather   . Hypertension Paternal Grandmother   . Diabetes Paternal Grandmother   . Hypertension Paternal Grandfather   . Diabetes Paternal Grandfather   . Thyroid disease Maternal Aunt     Past Medical History:  Diagnosis Date  . Migraine     Past Surgical History:  Procedure Laterality Date  . NO PAST SURGERIES      Current Outpatient Prescriptions  Medication Sig Dispense Refill  . Adapalene-Benzoyl Peroxide 0.1-2.5 % gel Apply one application to affected areas at bedtime. 45 g 1  .  amitriptyline (ELAVIL) 25 MG tablet Take 1 tablet (25 mg total) by mouth at bedtime. 30 tablet 12  . drospirenone-ethinyl estradiol (YAZ,GIANVI,LORYNA) 3-0.02 MG tablet Take 1 tablet by mouth daily. 3 Package 4  . ondansetron (ZOFRAN ODT) 4 MG disintegrating tablet Take 1 tablet (4 mg total) by mouth every 8 (eight) hours as needed for nausea or vomiting. 30 tablet 6  . rizatriptan (MAXALT) 10 MG tablet Take 1 tablet  (10 mg total) by mouth as needed for migraine. May repeat in 2 hours if needed (Patient not taking: Reported on 05/10/2017) 10 tablet 0   No current facility-administered medications for this visit.     Allergies as of 05/10/2017 - Review Complete 05/10/2017  Allergen Reaction Noted  . Imitrex [sumatriptan] Swelling 08/15/2016  . Penicillins  03/09/2011    Vitals: BP (!) 103/56   Pulse 88   Ht 5\' 4"  (1.626 m)   Wt 128 lb 6.4 oz (58.2 kg)   LMP  (LMP Unknown)   BMI 22.04 kg/m  Last Weight:  Wt Readings from Last 1 Encounters:  05/10/17 128 lb 6.4 oz (58.2 kg) (53 %, Z= 0.07)*   * Growth percentiles are based on CDC 2-20 Years data.   Last Height:   Ht Readings from Last 1 Encounters:  05/10/17 5\' 4"  (1.626 m) (46 %, Z= -0.11)*   * Growth percentiles are based on CDC 2-20 Years data.     Physical exam: Exam: Gen: NAD, conversant, well nourised, well groomed                     CV: RRR, no MRG. No Carotid Bruits. No peripheral edema, warm, nontender Eyes: Conjunctivae clear without exudates or hemorrhage  Neuro: Detailed Neurologic Exam  Speech:    Speech is normal; fluent and spontaneous with normal comprehension.  Cognition:    The patient is oriented to person, place, and time;     recent and remote memory intact;     language fluent;     normal attention, concentration,     fund of knowledge Cranial Nerves:    The pupils are equal, round, and reactive to light. The fundi are normal and spontaneous venous pulsations are present. Visual fields are full to finger confrontation. Extraocular movements are intact. Trigeminal sensation is intact and the muscles of mastication are normal. The face is symmetric. The palate elevates in the midline. Hearing intact. Voice is normal. Shoulder shrug is normal. The tongue has normal motion without fasciculations.   Coordination:    Normal finger to nose and heel to shin. Normal rapid alternating movements.   Gait:     Heel-toe and tandem gait are normal.   Motor Observation:    No asymmetry, no atrophy, and no involuntary movements noted. Tone:    Normal muscle tone.    Posture:    Posture is normal. normal erect    Strength:    Strength is V/V in the upper and lower limbs.      Sensation: intact to LT     Reflex Exam:  DTR's:    Deep tendon reflexes in the upper and lower extremities are normal bilaterally.   Toes:    The toes are downgoing bilaterally.   Clonus:    Clonus is absent.     Assessment/Plan:  Patient with daily migraines and insomnia. MRI of the brain was normal. Routine labs CBC and CMP unremarkable.  Remember to drink plenty of fluid, eat healthy meals and do  not skip any meals. Try to eat protein with a every meal and eat a healthy snack such as fruit or nuts in between meals. Try to keep a regular sleep-wake schedule and try to exercise daily, particularly in the form of walking, 20-30 minutes a day, if you can.   As far as your medications are concerned, I would like to suggest: Amitriptyline at bedtime for migraines and insomnia. We can adjust, instructed patient to email me in 4 weeks for progress. Increased to 25 mg at bedtime. We also discussed topiramate and other options..   Massage and heat for neck pain (she is in school to become a massage therapist)  Discussed: To prevent or relieve headaches, try the following: Cool Compress. Lie down and place a cool compress on your head.  Avoid headache triggers. If certain foods or odors seem to have triggered your migraines in the past, avoid them. A headache diary might help you identify triggers.  Include physical activity in your daily routine. Try a daily walk or other moderate aerobic exercise.  Manage stress. Find healthy ways to cope with the stressors, such as delegating tasks on your to-do list.  Practice relaxation techniques. Try deep breathing, yoga, massage and visualization.  Eat regularly. Eating  regularly scheduled meals and maintaining a healthy diet might help prevent headaches. Also, drink plenty of fluids.  Follow a regular sleep schedule. Sleep deprivation might contribute to headaches Consider biofeedback. With this mind-body technique, you learn to control certain bodily functions - such as muscle tension, heart rate and blood pressure - to prevent headaches or reduce headache pain.    Proceed to emergency room if you experience new or worsening symptoms or symptoms do not resolve, if you have new neurologic symptoms or if headache is severe, or for any concerning symptom.    Cc: Donella Stade, PA-C   Sarina Ill, MD  [facility] [address] [place], [place] [address]  Phone [phone] Fax [phone]  A total of 30 minutes was spent face-to-face with this patient. Over half this time was spent on counseling patient on the migraine diagnosis and different diagnostic and therapeutic options available.

## 2017-05-10 NOTE — Progress Notes (Signed)
Toradol 60 mg/2 ml administered IM to L deltoid using aseptic technique. Pt tolerated well.Bandaid applied.

## 2017-05-23 ENCOUNTER — Other Ambulatory Visit: Payer: Self-pay | Admitting: Physician Assistant

## 2017-05-25 ENCOUNTER — Encounter: Payer: Self-pay | Admitting: Neurology

## 2017-05-25 DIAGNOSIS — G43001 Migraine without aura, not intractable, with status migrainosus: Secondary | ICD-10-CM

## 2017-05-25 DIAGNOSIS — G44229 Chronic tension-type headache, not intractable: Secondary | ICD-10-CM

## 2017-05-25 MED ORDER — RIZATRIPTAN BENZOATE 10 MG PO TABS: 10.0000 mg | ORAL_TABLET | ORAL | 11 refills | Status: DC | PRN

## 2017-11-12 ENCOUNTER — Ambulatory Visit: Payer: Commercial Managed Care - PPO | Admitting: Neurology

## 2017-12-20 ENCOUNTER — Telehealth: Payer: Self-pay | Admitting: *Deleted

## 2017-12-20 ENCOUNTER — Ambulatory Visit: Payer: Commercial Managed Care - PPO | Admitting: Neurology

## 2017-12-20 DIAGNOSIS — Z0289 Encounter for other administrative examinations: Secondary | ICD-10-CM

## 2017-12-20 NOTE — Telephone Encounter (Signed)
Pt no show f/u appt on 12/20/2017 @ 08:30.

## 2017-12-21 ENCOUNTER — Encounter: Payer: Self-pay | Admitting: Neurology

## 2018-02-04 ENCOUNTER — Ambulatory Visit: Payer: Commercial Managed Care - PPO | Admitting: Neurology

## 2018-03-28 ENCOUNTER — Encounter: Payer: Self-pay | Admitting: Physician Assistant

## 2018-03-28 ENCOUNTER — Ambulatory Visit (INDEPENDENT_AMBULATORY_CARE_PROVIDER_SITE_OTHER): Payer: Commercial Managed Care - PPO | Admitting: Physician Assistant

## 2018-03-28 VITALS — BP 101/56 | HR 77 | Ht 64.0 in | Wt 140.0 lb

## 2018-03-28 DIAGNOSIS — N926 Irregular menstruation, unspecified: Secondary | ICD-10-CM | POA: Diagnosis not present

## 2018-03-28 DIAGNOSIS — G43001 Migraine without aura, not intractable, with status migrainosus: Secondary | ICD-10-CM | POA: Diagnosis not present

## 2018-03-28 DIAGNOSIS — Z113 Encounter for screening for infections with a predominantly sexual mode of transmission: Secondary | ICD-10-CM | POA: Diagnosis not present

## 2018-03-28 MED ORDER — RIZATRIPTAN BENZOATE 10 MG PO TABS
10.0000 mg | ORAL_TABLET | ORAL | 11 refills | Status: DC | PRN
Start: 2018-03-28 — End: 2019-05-20

## 2018-03-28 MED ORDER — NORGESTIM-ETH ESTRAD TRIPHASIC 0.18/0.215/0.25 MG-35 MCG PO TABS: 1.0000 | ORAL_TABLET | Freq: Every day | ORAL | 11 refills | Status: DC
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
## 2018-03-29 ENCOUNTER — Encounter: Payer: Self-pay | Admitting: Physician Assistant

## 2018-03-29 LAB — RPR: RPR Ser Ql: NONREACTIVE

## 2018-03-29 LAB — C. TRACHOMATIS/N. GONORRHOEAE RNA
C. TRACHOMATIS RNA, TMA: NOT DETECTED
N. gonorrhoeae RNA, TMA: NOT DETECTED

## 2018-03-29 LAB — HIV ANTIBODY (ROUTINE TESTING W REFLEX): HIV 1&2 Ab, 4th Generation: NONREACTIVE

## 2018-03-29 LAB — TSH: TSH: 1.06 mIU/L

## 2018-03-29 NOTE — Progress Notes (Signed)
Call pt:STD are negative.

## 2018-03-29 NOTE — Progress Notes (Signed)
Call pt: normal TSH.

## 2018-04-24 ENCOUNTER — Encounter: Payer: Self-pay | Admitting: Physician Assistant

## 2018-05-28 ENCOUNTER — Encounter: Payer: Self-pay | Admitting: Physician Assistant

## 2018-06-25 ENCOUNTER — Other Ambulatory Visit: Payer: Self-pay

## 2018-06-25 DIAGNOSIS — G43001 Migraine without aura, not intractable, with status migrainosus: Secondary | ICD-10-CM

## 2018-06-25 DIAGNOSIS — N926 Irregular menstruation, unspecified: Secondary | ICD-10-CM

## 2018-06-25 MED ORDER — NORGESTIM-ETH ESTRAD TRIPHASIC 0.18/0.215/0.25 MG-35 MCG PO TABS: 1.0000 | ORAL_TABLET | Freq: Every day | ORAL | 11 refills | Status: DC

## 2018-09-01 ENCOUNTER — Other Ambulatory Visit: Payer: Self-pay

## 2018-09-01 ENCOUNTER — Encounter: Payer: Self-pay | Admitting: Emergency Medicine

## 2018-09-01 ENCOUNTER — Emergency Department (INDEPENDENT_AMBULATORY_CARE_PROVIDER_SITE_OTHER)
Admission: EM | Admit: 2018-09-01 | Discharge: 2018-09-01 | Disposition: A | Discharge status: Home / Self Care | Payer: Commercial Managed Care - PPO | Source: Home / Self Care | Attending: Emergency Medicine | Admitting: Emergency Medicine

## 2018-09-01 DIAGNOSIS — J209 Acute bronchitis, unspecified: Secondary | ICD-10-CM

## 2018-09-01 DIAGNOSIS — J039 Acute tonsillitis, unspecified: Secondary | ICD-10-CM | POA: Diagnosis not present

## 2018-09-01 LAB — POCT RAPID STREP A (OFFICE): Rapid Strep A Screen: NEGATIVE

## 2018-09-01 MED ORDER — AZITHROMYCIN 250 MG PO TABS: ORAL_TABLET | ORAL | 0 refills | Status: DC

## 2018-09-01 MED ORDER — BENZONATATE 200 MG PO CAPS: ORAL_CAPSULE | ORAL | 0 refills | Status: DC

## 2018-09-01 NOTE — ED Triage Notes (Signed)
Patient reports sore throat, cough and raspy voice for past 2 days; no known fever; no OTC today.

## 2018-09-01 NOTE — Discharge Instructions (Addendum)
Although quick strep test negative, you have throat infection and mild tonsillitis.  Also bronchitis. We are sending off strep culture. 2 prescriptions already sent to your pharmacy, [facility]: -prescription for Zithromax Z-Pak -Tessalon Perles, 1 by mouth every 8 hours as needed for cough. Rest, push fluids. Note written to excuse from work tomorrow Follow-up with PCP if not improved in 5 to 7 days, sooner if worse or new symptoms.
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
## 2018-09-03 ENCOUNTER — Telehealth: Payer: Self-pay

## 2018-09-03 LAB — STREP A DNA PROBE: Group A Strep Probe: NOT DETECTED

## 2018-09-03 NOTE — Telephone Encounter (Signed)
Left msg with neg tcx results. Advised to call should she have any questions or concerns.

## 2018-09-16 ENCOUNTER — Emergency Department (INDEPENDENT_AMBULATORY_CARE_PROVIDER_SITE_OTHER)
Admission: EM | Admit: 2018-09-16 | Discharge: 2018-09-16 | Disposition: A | Discharge status: Home / Self Care | Payer: Commercial Managed Care - PPO | Source: Home / Self Care | Attending: Family Medicine | Admitting: Family Medicine

## 2018-09-16 ENCOUNTER — Other Ambulatory Visit: Payer: Self-pay

## 2018-09-16 ENCOUNTER — Other Ambulatory Visit (HOSPITAL_COMMUNITY)
Admission: RE | Admit: 2018-09-16 | Discharge: 2018-09-16 | Disposition: A | Discharge status: Home / Self Care | Payer: Commercial Managed Care - PPO | Source: Ambulatory Visit | Attending: Family Medicine | Admitting: Family Medicine

## 2018-09-16 DIAGNOSIS — N898 Other specified noninflammatory disorders of vagina: Secondary | ICD-10-CM | POA: Diagnosis present

## 2018-09-16 DIAGNOSIS — M545 Low back pain, unspecified: Secondary | ICD-10-CM

## 2018-09-16 LAB — POCT URINALYSIS DIP (MANUAL ENTRY)
Bilirubin, UA: NEGATIVE
Blood, UA: NEGATIVE
Glucose, UA: NEGATIVE mg/dL
Ketones, POC UA: NEGATIVE mg/dL
Leukocytes, UA: NEGATIVE
Nitrite, UA: NEGATIVE
Protein Ur, POC: NEGATIVE mg/dL
Spec Grav, UA: 1.02 (ref 1.010–1.025)
Urobilinogen, UA: 0.2 E.U./dL
pH, UA: 7 (ref 5.0–8.0)

## 2018-09-16 MED ORDER — FLUCONAZOLE 150 MG PO TABS: 150.0000 mg | ORAL_TABLET | Freq: Once | ORAL | 0 refills | Status: AC

## 2018-09-16 NOTE — ED Triage Notes (Signed)
Pt has had vaginal discharge for the last few days.  Last couple of weeks has had pain occasionally when urinating, as well as itching and odor.

## 2018-09-16 NOTE — Discharge Instructions (Signed)
°  You will be notified of your test results in 2 days. If additional medication is needed, it may be called into your preferred pharmacy.  Please follow up with family medicine or an OB/GYN if not improving in 1 week.

## 2018-09-16 NOTE — ED Provider Notes (Signed)
[facility]    CSN: [id number] Arrival date & time: 09/16/18  1755     History   Chief Complaint Chief Complaint  Patient presents with  . Vaginal odor and itching    HPI Amanda [name] is a 22 y.o. female.   HPI Amanda [name] is a 23 y.o. female presenting to UC with c/o vaginal discharge, itching and burning for a few days.  Occasional pain with urination the last few weeks and odor. Denies abdominal pain or back pain. Denies fever, chills, n/v/d. Not concerned for STIs but does not mind being checked for them today. Hx of yeast infections in the past but normally she has more discharge.    Past Medical History:  Diagnosis Date  . Migraine     Patient Active Problem List   Diagnosis Date Noted  . Chronic migraine without aura 02/11/2017  . Vision loss of left eye 01/17/2017  . Chronic tension-type headache, not intractable 08/29/2016  . Burn of face, first degree 08/29/2016  . Chronic frontal sinusitis 08/15/2016  . Migraine without aura and with status migrainosus, not intractable 05/17/2016  . Abscess, ear canal 12/06/2015  . Eustachian tube dysfunction 08/20/2015  . Acne 11/18/2014  . Irregular periods/menstrual cycles 11/18/2014  . Dysmenorrhea 11/18/2014  . Left peritonsillar abscess 01/02/2013    Past Surgical History:  Procedure Laterality Date  . NO PAST SURGERIES      OB History    Gravida  0   Para  0   Term  0   Preterm  0   AB  0   Living  0     SAB  0   TAB  0   Ectopic  0   Multiple  0   Live Births  0            Home Medications    Prior to Admission medications   Medication Sig Start Date End Date Taking? Authorizing Provider  azithromycin (ZITHROMAX Z-PAK) 250 MG tablet Take 2 tablets on day one, then 1 tablet daily on days 2 through 5 09/01/18   Jacqulyn Cane, MD  benzonatate (TESSALON) 200 MG capsule Take 1 every 8 hours as needed for cough. 09/01/18   Jacqulyn Cane, MD  Norgestimate-Ethinyl Estradiol  Triphasic 0.18/0.215/0.25 MG-35 MCG tablet Take 1 tablet by mouth daily. 06/25/18   Breeback, Jade L, PA-C  rizatriptan (MAXALT) 10 MG tablet Take 1 tablet (10 mg total) by mouth as needed for migraine. May repeat in 2 hours if needed 03/28/18   Donella Stade, PA-C    Family History Family History  Problem Relation Age of Onset  . Migraines Mother   . Skin cancer Father   . Asthma Maternal Grandmother   . Hypertension Maternal Grandmother   . Thyroid disease Maternal Grandmother   . Hypertension Maternal Grandfather   . Hypertension Paternal Grandmother   . Diabetes Paternal Grandmother   . Hypertension Paternal Grandfather   . Diabetes Paternal Grandfather   . Thyroid disease Maternal Aunt     Social History Social History   Tobacco Use  . Smoking status: Never Smoker  . Smokeless tobacco: Never Used  Substance Use Topics  . Alcohol use: No  . Drug use: No     Allergies   Imitrex [sumatriptan] and Penicillins   Review of Systems Review of Systems  Constitutional: Negative for chills and fever.  Gastrointestinal: Negative for abdominal pain, nausea and vomiting.  Genitourinary: Positive for dysuria, vaginal discharge and vaginal  pain. Negative for frequency, pelvic pain and urgency.  Musculoskeletal: Positive for back pain (Left lower). Negative for myalgias.     Physical Exam Triage Vital Signs ED Triage Vitals  Enc Vitals Group     BP 09/16/18 1821 107/70     Pulse Rate 09/16/18 1821 65     Resp --      Temp 09/16/18 1821 98.5 F (36.9 C)     Temp Source 09/16/18 1821 Oral     SpO2 09/16/18 1821 100 %     Weight 09/16/18 1822 133 lb (60.3 kg)     Height 09/16/18 1822 5\' 4"  (1.626 m)     Head Circumference --      Peak Flow --      Pain Score 09/16/18 1822 0     Pain Loc --      Pain Edu? --      Excl. in [place]? --    No data found.  Updated Vital Signs BP 107/70 (BP Location: Right Arm)   Pulse 65   Temp 98.5 F (36.9 C) (Oral)   Ht 5\' 4"  (1.626  m)   Wt 133 lb (60.3 kg)   LMP 09/09/2018   SpO2 100%   BMI 22.83 kg/m   Visual Acuity Right Eye Distance:   Left Eye Distance:   Bilateral Distance:    Right Eye Near:   Left Eye Near:    Bilateral Near:     Physical Exam  Constitutional: She is oriented to person, place, and time. She appears well-developed and well-nourished. No distress.  HENT:  Head: Normocephalic and atraumatic.  Eyes: EOM are normal.  Neck: Normal range of motion.  Cardiovascular: Normal rate and regular rhythm.  Pulmonary/Chest: Effort normal and breath sounds normal. No stridor. No respiratory distress. She has no wheezes. She has no rales.  Abdominal: Soft. She exhibits no distension. There is no tenderness. There is no CVA tenderness.  Genitourinary:  Genitourinary Comments: Chaperoned exam. Normal external genitalia. Vaginal canal: moderate amount of white-yellow thin discharge. No bleeding. No CMT or adnexal tenderness or masses.   Musculoskeletal: Normal range of motion.  Neurological: She is alert and oriented to person, place, and time.  Skin: Skin is warm and dry. She is not diaphoretic.  Psychiatric: She has a normal mood and affect. Her behavior is normal.  Nursing note and vitals reviewed.    UC Treatments / Results  Labs (all labs ordered are listed, but only abnormal results are displayed) Labs Reviewed  POCT URINALYSIS DIP (MANUAL ENTRY) - Abnormal; Notable for the following components:      Result Value   Clarity, UA cloudy (*)    All other components within normal limits  CERVICOVAGINAL ANCILLARY ONLY    EKG None  Radiology No results found.  Procedures Procedures (including critical care time)  Medications Ordered in UC Medications - No data to display  Initial Impression / Assessment and Plan / UC Course  I have reviewed the triage vital signs and the nursing notes.  Pertinent labs & imaging results that were available during my care of the patient were reviewed  by me and considered in my medical decision making (see chart for details).     Hx and exam c/w vaginal yeast infection Vaginal swab sent for definitive dx   Final Clinical Impressions(s) / UC Diagnoses   Final diagnoses:  Vaginal itching  Vaginal odor  Acute left-sided low back pain without sciatica     Discharge Instructions  You will be notified of your test results in 2 days. If additional medication is needed, it may be called into your preferred pharmacy.  Please follow up with family medicine or an OB/GYN if not improving in 1 week.     ED Prescriptions    Medication Sig Dispense Auth. Provider   fluconazole (DIFLUCAN) 150 MG tablet Take 1 tablet (150 mg total) by mouth once for 1 dose. 1 tablet Noe Gens, PA-C     Controlled Substance Prescriptions [place] Controlled Substance Registry consulted? Not Applicable   Tyrell Antonio 09/17/18 1138

## 2018-09-18 LAB — CERVICOVAGINAL ANCILLARY ONLY
Bacterial vaginitis: POSITIVE — AB
Candida vaginitis: NEGATIVE
Chlamydia: NEGATIVE
Neisseria Gonorrhea: NEGATIVE
Trichomonas: NEGATIVE

## 2018-09-19 ENCOUNTER — Encounter: Payer: Self-pay | Admitting: Physician Assistant

## 2018-09-19 ENCOUNTER — Telehealth: Payer: Self-pay

## 2018-09-19 MED ORDER — METRONIDAZOLE 500 MG PO TABS: 500.0000 mg | ORAL_TABLET | Freq: Two times a day (BID) | ORAL | 0 refills | Status: AC

## 2018-09-19 NOTE — Telephone Encounter (Signed)
Pt called regarding positive lab results.  Spoke with Dr. Assunta Found, Flagyl 500 mg 1 bidx 7 days called to [facility]

## 2018-10-25 ENCOUNTER — Ambulatory Visit (INDEPENDENT_AMBULATORY_CARE_PROVIDER_SITE_OTHER): Payer: Commercial Managed Care - PPO | Admitting: Family Medicine

## 2018-10-25 ENCOUNTER — Encounter: Payer: Self-pay | Admitting: Family Medicine

## 2018-10-25 VITALS — BP 130/73 | HR 95 | Ht 63.0 in | Wt 130.0 lb

## 2018-10-25 DIAGNOSIS — N926 Irregular menstruation, unspecified: Secondary | ICD-10-CM | POA: Diagnosis not present

## 2018-10-25 LAB — POCT URINE PREGNANCY: Preg Test, Ur: NEGATIVE

## 2018-10-25 MED ORDER — NORGESTIMATE-ETH ESTRADIOL 0.25-35 MG-MCG PO TABS: 1.0000 | ORAL_TABLET | Freq: Every day | ORAL | 3 refills | Status: DC

## 2018-10-25 NOTE — Progress Notes (Signed)
Irregular periods Aug28-31 Sept 9-12 Sept 20-23 OCt 14-26 HQN0- currently still on period.  Patient has been on Tri-Sprintec since Jan. 2019

## 2018-10-25 NOTE — Progress Notes (Signed)
   Subjective:    Patient ID: Amanda [name], female    DOB: [birth year], 22 y.o.   MRN: [id number]  HPI Patient having irregular bleeding on tri-sprintec. Has been taking daily with no missed pills. Currently on period that started Nov 3rd and is on second week of tabs. Other cycles in nursing notes, which I reviewed. Has been on tri-sprintec since January. Having some dizziness when massaging for long periods. Fatigued, mood swings.   Review of Systems     Objective:   Physical Exam  Constitutional: She is oriented to person, place, and time. She appears well-developed and well-nourished.  Cardiovascular: Normal rate and regular rhythm.  Pulmonary/Chest: Effort normal and breath sounds normal.  Abdominal: Soft. She exhibits no distension. There is no tenderness.  Neurological: She is alert and oriented to person, place, and time.  Skin: Skin is warm and dry.      Assessment & Plan:  1. Abnormal menstrual periods Check CBC. Likely secondary to triphasic OCPs. Change to monophasic - start with taper, then as prescribed. F/u in 3 months. Patient to call or message if any problems in interim. - POCT urine pregnancy - CBC

## 2018-10-25 NOTE — Patient Instructions (Signed)
For birth control taper:  Take 3 tabs daily x 3 days, then 2 tabs daily for 3 days, then 1 tab daily. Skip placebo week and start new pack

## 2018-10-26 LAB — CBC
Hematocrit: 39.3 % (ref 34.0–46.6)
Hemoglobin: 13.3 g/dL (ref 11.1–15.9)
MCH: 30 pg (ref 26.6–33.0)
MCHC: 33.8 g/dL (ref 31.5–35.7)
MCV: 89 fL (ref 79–97)
PLATELETS: 218 10*3/uL (ref 150–450)
RBC: 4.44 x10E6/uL (ref 3.77–5.28)
RDW: 11.7 % — AB (ref 12.3–15.4)
WBC: 3.7 10*3/uL (ref 3.4–10.8)

## 2018-11-26 ENCOUNTER — Emergency Department (INDEPENDENT_AMBULATORY_CARE_PROVIDER_SITE_OTHER)
Admission: EM | Admit: 2018-11-26 | Discharge: 2018-11-26 | Disposition: A | Discharge status: Home / Self Care | Payer: Commercial Managed Care - PPO | Source: Home / Self Care

## 2018-11-26 ENCOUNTER — Encounter: Payer: Self-pay | Admitting: Physician Assistant

## 2018-11-26 ENCOUNTER — Other Ambulatory Visit: Payer: Self-pay

## 2018-11-26 DIAGNOSIS — N3001 Acute cystitis with hematuria: Secondary | ICD-10-CM

## 2018-11-26 LAB — POCT URINALYSIS DIP (MANUAL ENTRY)
Bilirubin, UA: NEGATIVE
Glucose, UA: NEGATIVE mg/dL
Ketones, POC UA: NEGATIVE mg/dL
Nitrite, UA: POSITIVE — AB
Protein Ur, POC: 100 mg/dL — AB
Spec Grav, UA: 1.025 (ref 1.010–1.025)
Urobilinogen, UA: 0.2 E.U./dL
pH, UA: 7 (ref 5.0–8.0)

## 2018-11-26 MED ORDER — CEPHALEXIN 500 MG PO CAPS: 500.0000 mg | ORAL_CAPSULE | Freq: Two times a day (BID) | ORAL | 0 refills | Status: DC

## 2018-11-26 NOTE — ED Triage Notes (Signed)
Pt stated that she had some back pain last weekend, but burning and frequency started yesterday.  She stated that she is passing blood, and when she does, it is very painful.

## 2018-11-26 NOTE — ED Provider Notes (Signed)
[facility]    CSN: [id number] Arrival date & time: 11/26/18  1123     History   Chief Complaint Chief Complaint  Patient presents with  . Dysuria    HPI Amanda [name] is a 22 y.o. female.   HPI Amanda [name] is a 22 y.o. female presenting to UC with c/o low back pain that started 2-3 days ago, associated worsening dysuria with burning, frequency and hematuria since yesterday. She has not taken anything for her symptoms. Hx of UTIs, last one was 1.5 years ago, symptoms feel similar. Denies fever, chills, n/v/d.    Past Medical History:  Diagnosis Date  . Migraine     Patient Active Problem List   Diagnosis Date Noted  . Chronic migraine without aura 02/11/2017  . Vision loss of left eye 01/17/2017  . Chronic tension-type headache, not intractable 08/29/2016  . Burn of face, first degree 08/29/2016  . Chronic frontal sinusitis 08/15/2016  . Migraine without aura and with status migrainosus, not intractable 05/17/2016  . Abscess, ear canal 12/06/2015  . Eustachian tube dysfunction 08/20/2015  . Acne 11/18/2014  . Irregular periods/menstrual cycles 11/18/2014  . Dysmenorrhea 11/18/2014  . Left peritonsillar abscess 01/02/2013    Past Surgical History:  Procedure Laterality Date  . NO PAST SURGERIES      OB History    Gravida  0   Para  0   Term  0   Preterm  0   AB  0   Living  0     SAB  0   TAB  0   Ectopic  0   Multiple  0   Live Births  0            Home Medications    Prior to Admission medications   Medication Sig Start Date End Date Taking? Authorizing Provider  azithromycin (ZITHROMAX Z-PAK) 250 MG tablet Take 2 tablets on day one, then 1 tablet daily on days 2 through 5 Patient not taking: Reported on 10/25/2018 09/01/18   Jacqulyn Cane, MD  benzonatate (TESSALON) 200 MG capsule Take 1 every 8 hours as needed for cough. Patient not taking: Reported on 10/25/2018 09/01/18   Jacqulyn Cane, MD  cephALEXin (KEFLEX) 500 MG  capsule Take 1 capsule (500 mg total) by mouth 2 (two) times daily. 11/26/18   Noe Gens, PA-C  norgestimate-ethinyl estradiol (ORTHO-CYCLEN,SPRINTEC,PREVIFEM) 0.25-35 MG-MCG tablet Take 1 tablet by mouth daily. 10/25/18   Truett Mainland, DO  rizatriptan (MAXALT) 10 MG tablet Take 1 tablet (10 mg total) by mouth as needed for migraine. May repeat in 2 hours if needed Patient not taking: Reported on 10/25/2018 03/28/18   Donella Stade, PA-C    Family History Family History  Problem Relation Age of Onset  . Migraines Mother   . Skin cancer Father   . Asthma Maternal Grandmother   . Hypertension Maternal Grandmother   . Thyroid disease Maternal Grandmother   . Hypertension Maternal Grandfather   . Hypertension Paternal Grandmother   . Diabetes Paternal Grandmother   . Hypertension Paternal Grandfather   . Diabetes Paternal Grandfather   . Thyroid disease Maternal Aunt     Social History Social History   Tobacco Use  . Smoking status: Never Smoker  . Smokeless tobacco: Never Used  Substance Use Topics  . Alcohol use: No  . Drug use: No     Allergies   Imitrex [sumatriptan] and Penicillins   Review of Systems Review of Systems  Constitutional: Negative for chills and fever.  Gastrointestinal: Negative for diarrhea, nausea and vomiting.  Genitourinary: Positive for dysuria, frequency, hematuria and pelvic pain ( pressure). Negative for urgency.  Musculoskeletal: Negative for back pain.     Physical Exam Triage Vital Signs ED Triage Vitals  Enc Vitals Group     BP 11/26/18 1215 105/68     Pulse Rate 11/26/18 1215 86     Resp 11/26/18 1215 18     Temp 11/26/18 1215 98 F (36.7 C)     Temp Source 11/26/18 1215 Oral     SpO2 11/26/18 1215 100 %     Weight 11/26/18 1217 127 lb (57.6 kg)     Height 11/26/18 1217 5\' 4"  (1.626 m)     Head Circumference --      Peak Flow --      Pain Score 11/26/18 1216 8     Pain Loc --      Pain Edu? --      Excl. in [place]? --     No data found.  Updated Vital Signs BP 105/68 (BP Location: Right Arm)   Pulse 86   Temp 98 F (36.7 C) (Oral)   Resp 18   Ht 5\' 4"  (1.626 m)   Wt 127 lb (57.6 kg)   LMP 11/19/2018   SpO2 100%   BMI 21.80 kg/m   Visual Acuity Right Eye Distance:   Left Eye Distance:   Bilateral Distance:    Right Eye Near:   Left Eye Near:    Bilateral Near:     Physical Exam  Constitutional: She is oriented to person, place, and time. She appears well-developed and well-nourished.  HENT:  Head: Normocephalic and atraumatic.  Mouth/Throat: Oropharynx is clear and moist.  Eyes: EOM are normal.  Neck: Normal range of motion.  Cardiovascular: Normal rate and regular rhythm.  Pulmonary/Chest: Effort normal. No respiratory distress.  Abdominal: Soft. She exhibits no distension. There is no tenderness. There is no CVA tenderness.  Musculoskeletal: Normal range of motion.  Neurological: She is alert and oriented to person, place, and time.  Skin: Skin is warm and dry.  Psychiatric: She has a normal mood and affect. Her behavior is normal.  Nursing note and vitals reviewed.    UC Treatments / Results  Labs (all labs ordered are listed, but only abnormal results are displayed) Labs Reviewed  POCT URINALYSIS DIP (MANUAL ENTRY) - Abnormal; Notable for the following components:      Result Value   Clarity, UA cloudy (*)    Blood, UA large (*)    Protein Ur, POC =100 (*)    Nitrite, UA Positive (*)    Leukocytes, UA Small (1+) (*)    All other components within normal limits  URINE CULTURE    EKG None  Radiology No results found.  Procedures Procedures (including critical care time)  Medications Ordered in UC Medications - No data to display  Initial Impression / Assessment and Plan / UC Course  I have reviewed the triage vital signs and the nursing notes.  Pertinent labs & imaging results that were available during my care of the patient were reviewed by me and  considered in my medical decision making (see chart for details).     UA c/w UTI Culture sent Will start on keflex Home care info provided  Final Clinical Impressions(s) / UC Diagnoses   Final diagnoses:  Acute cystitis with hematuria     Discharge Instructions  You may try over the counter medication called Azo to help with bladder spasms.  This medication can make your urine orange, which is normal.    Please take antibiotics as prescribed and be sure to complete entire course even if you start to feel better to ensure infection does not come back.     ED Prescriptions    Medication Sig Dispense Auth. Provider   cephALEXin (KEFLEX) 500 MG capsule Take 1 capsule (500 mg total) by mouth 2 (two) times daily. 14 capsule Noe Gens, PA-C     Controlled Substance Prescriptions [place] Controlled Substance Registry consulted? Not Applicable   Noe Gens, PA-C 11/26/18 1356

## 2018-11-26 NOTE — Discharge Instructions (Signed)
°  You may try over the counter medication called Azo to help with bladder spasms.  This medication can make your urine orange, which is normal.    Please take antibiotics as prescribed and be sure to complete entire course even if you start to feel better to ensure infection does not come back.

## 2018-11-28 ENCOUNTER — Telehealth: Payer: Self-pay | Admitting: *Deleted

## 2018-11-28 LAB — URINE CULTURE
MICRO NUMBER:: 91477437
SPECIMEN QUALITY:: ADEQUATE

## 2018-11-28 NOTE — Telephone Encounter (Signed)
Spoke to pt given Ucx results and advised to call back if any questions or concerns. Charna Archer, LPN

## 2018-12-13 ENCOUNTER — Encounter: Payer: Self-pay | Admitting: Physician Assistant

## 2018-12-16 ENCOUNTER — Other Ambulatory Visit: Payer: Self-pay

## 2018-12-16 DIAGNOSIS — N926 Irregular menstruation, unspecified: Secondary | ICD-10-CM

## 2018-12-16 MED ORDER — NORGESTIMATE-ETH ESTRADIOL 0.25-35 MG-MCG PO TABS: 1.0000 | ORAL_TABLET | Freq: Every day | ORAL | 3 refills | Status: DC

## 2018-12-18 NOTE — L&D Delivery Note (Signed)
Delivery Note   Yanara, Schenk [id number]  At  a viable FEMALE adult was delivered via Vertex presentation at 2048.  APGAR:8, 9; weight 7 lbs 8 oz.   Placenta status: spon , mono-di twin.  Cord: 3V,  with the following complications:none "Kinsley"  Anesthesia:  Epidural Episiotomy:  none Lacerations:  2nd degree Suture Repair: 2.0 vicryl Est. Blood Loss (mL):  300 mL    Dalay, Venecia [id number]  At  a viable FEMALE adult was delivered via Breech Extraction at 2053.  After Twin A delivery examination revealed complete breech (exam and [place]).  Unable to rotate or vert to vertex.  Feet grasped and AROM done.  Gentle downward traction to level of axilla.  Arms swept across chest.  Head atraumatically delivered.  APGAR: 6,7 ; weight  4 lbs 2 oz.   Placenta status:spontaneous.  Cord:3V;  with the following complications: none.   Placenta to pathology.  Cords clamped to signify A (one) and B (two clamps) "Kailey"  Anesthesia:  Epidural Episiotomy:  None Lacerations: 25md degree  Suture Repair: 2.0 vicryl Est. Blood Loss (mL):  300 mL   Mom to postpartum.   Baby A to Couplet care / Skin to Skin.   Baby B to Special Care Nursery.  Hoyt Koch 12/06/2019, 9:17 PM

## 2019-01-09 ENCOUNTER — Ambulatory Visit: Payer: Commercial Managed Care - PPO | Admitting: Family Medicine

## 2019-01-15 ENCOUNTER — Ambulatory Visit (INDEPENDENT_AMBULATORY_CARE_PROVIDER_SITE_OTHER): Payer: Commercial Managed Care - PPO | Admitting: Obstetrics & Gynecology

## 2019-01-15 ENCOUNTER — Encounter: Payer: Self-pay | Admitting: Obstetrics & Gynecology

## 2019-01-15 VITALS — BP 134/71 | HR 88 | Ht 63.0 in | Wt 128.0 lb

## 2019-01-15 DIAGNOSIS — N921 Excessive and frequent menstruation with irregular cycle: Secondary | ICD-10-CM

## 2019-01-15 DIAGNOSIS — Z113 Encounter for screening for infections with a predominantly sexual mode of transmission: Secondary | ICD-10-CM | POA: Diagnosis not present

## 2019-01-15 MED ORDER — NORETHIN ACE-ETH ESTRAD-FE 1-20 MG-MCG(24) PO TABS: 1.0000 | ORAL_TABLET | Freq: Every day | ORAL | 11 refills | Status: DC

## 2019-01-15 NOTE — Progress Notes (Signed)
Patient reports bleeding for last three weeks. Patient states that bleeding does NOT happen throughout the day. She has various "big gush" of bleeding randomly. Kathrene Alu RN

## 2019-01-15 NOTE — Progress Notes (Signed)
History:  23 y.o. G0P0000 here today for eval of AUB. Pt reports cycles that were controlled with OCPs. She reports that she was on OCPs at [facility] and was taken off on the suspicion of atrophic endometrium . She is now on Sprinted and has been on it for 5 months and started bleeding irreg 1/6 to present. No new sexual partner. Pt reports cramping with the bleeding as well. Pt reports pain ONLY with menses. Pt was prev on TripSprintec. Was also prev on [place] (from 16 years on).        The following portions of the patient's history were reviewed and updated as appropriate: allergies, current medications, past family history, past medical history, past social history, past surgical history and problem list.  Review of Systems:  Pertinent items are noted in HPI.    Objective:  Physical Exam Blood pressure 134/71, pulse 88, height 5\' 3"  (1.6 m), weight 128 lb (58.1 kg).  CONSTITUTIONAL: Well-developed, well-nourished female in no acute distress.  HENT:  Normocephalic, atraumatic EYES: Conjunctivae and EOM are normal. No scleral icterus.  NECK: Normal range of motion SKIN: Skin is warm and dry. No rash noted. Not diaphoretic.No pallor. [place]: Alert and oriented to person, place, and time. Normal coordination.  Abd; soft, Nt, ND GU: EGBUS: no lesions Vagina: no blood in vault Cervix: no lesion; no mucopurulent d/c; no CMT Uterus: small, mobile Adnexa: no masses; sl tender bilaterally   Assessment & Plan:  Diagnoses and all orders for this visit:  Breakthrough bleeding on birth control pills -     Norethindrone Acetate-Ethinyl Estrad-FE (LOESTRIN 24 FE) 1-20 MG-MCG(24) tablet; Take 1 tablet by mouth daily. -     Cervicovaginal ancillary only( [place])  Screening for STD (sexually transmitted disease) -     Cervicovaginal ancillary only( )  f/u in 3 months or sooner prn Condom use at all times. Educated to latex free condoms.     Total face-to-face time with  patient was 30 min.  Greater than 50% was spent in counseling and coordination of care with the patient.   Sharia Averitt L. Harraway-Smith, M.D., Cherlynn June

## 2019-01-17 LAB — CERVICOVAGINAL ANCILLARY ONLY
CHLAMYDIA, DNA PROBE: NEGATIVE
NEISSERIA GONORRHEA: NEGATIVE
Trichomonas: NEGATIVE

## 2019-04-18 ENCOUNTER — Encounter

## 2019-05-13 ENCOUNTER — Encounter: Payer: Commercial Managed Care - PPO | Admitting: Advanced Practice Midwife

## 2019-05-20 ENCOUNTER — Ambulatory Visit (INDEPENDENT_AMBULATORY_CARE_PROVIDER_SITE_OTHER): Payer: Commercial Managed Care - PPO

## 2019-05-20 ENCOUNTER — Other Ambulatory Visit: Payer: Self-pay | Admitting: Obstetrics and Gynecology

## 2019-05-20 ENCOUNTER — Other Ambulatory Visit (HOSPITAL_COMMUNITY)
Admission: RE | Admit: 2019-05-20 | Discharge: 2019-05-20 | Disposition: A | Discharge status: Home / Self Care | Payer: Commercial Managed Care - PPO | Source: Ambulatory Visit | Attending: Obstetrics and Gynecology | Admitting: Obstetrics and Gynecology

## 2019-05-20 ENCOUNTER — Ambulatory Visit (INDEPENDENT_AMBULATORY_CARE_PROVIDER_SITE_OTHER): Payer: Commercial Managed Care - PPO | Admitting: Obstetrics and Gynecology

## 2019-05-20 ENCOUNTER — Other Ambulatory Visit: Payer: Self-pay

## 2019-05-20 ENCOUNTER — Encounter: Payer: Self-pay | Admitting: Obstetrics and Gynecology

## 2019-05-20 ENCOUNTER — Telehealth: Payer: Self-pay

## 2019-05-20 VITALS — BP 102/54 | Wt 128.0 lb

## 2019-05-20 DIAGNOSIS — Z3401 Encounter for supervision of normal first pregnancy, first trimester: Secondary | ICD-10-CM

## 2019-05-20 DIAGNOSIS — Z3A01 Less than 8 weeks gestation of pregnancy: Secondary | ICD-10-CM

## 2019-05-20 DIAGNOSIS — Z113 Encounter for screening for infections with a predominantly sexual mode of transmission: Secondary | ICD-10-CM

## 2019-05-20 DIAGNOSIS — Z124 Encounter for screening for malignant neoplasm of cervix: Secondary | ICD-10-CM

## 2019-05-20 DIAGNOSIS — O30031 Twin pregnancy, monochorionic/diamniotic, first trimester: Secondary | ICD-10-CM

## 2019-05-20 DIAGNOSIS — O30091 Twin pregnancy, unable to determine number of placenta and number of amniotic sacs, first trimester: Secondary | ICD-10-CM

## 2019-05-20 DIAGNOSIS — O0990 Supervision of high risk pregnancy, unspecified, unspecified trimester: Secondary | ICD-10-CM | POA: Insufficient documentation

## 2019-05-20 DIAGNOSIS — O30039 Twin pregnancy, monochorionic/diamniotic, unspecified trimester: Secondary | ICD-10-CM | POA: Insufficient documentation

## 2019-05-20 NOTE — Telephone Encounter (Signed)
Please schedule telephone visit. Lots to discuss

## 2019-05-20 NOTE — Telephone Encounter (Signed)
Please advise. Thank you

## 2019-05-20 NOTE — Progress Notes (Signed)
NOB C/o nausea, not vomiting just drive heaving. Possibly wanting genetic screening  Denies vb, no cramping   Got flu shot this season

## 2019-05-20 NOTE — Telephone Encounter (Signed)
Pt calling; had to rush out of appt this am; CRS wanted to talk to her about the shape of her uterus and the twins.  [phone]

## 2019-05-20 NOTE — Patient Instructions (Signed)
 Hello,  Given the current COVID-19 pandemic, our practice is making changes in how we are providing care to our patients. We are limiting in-person visits for the safety of all of our patients.   As a practice, we have met to discuss the best way to minimize visits, but still provide excellent care to our expecting mothers.  We have decided on the following visit structure for low-risk pregnancies.  Initial Pregnancy visit will be conducted as a telephone or web visit.  Between 10-14 weeks  there will be one in-person visit for an ultrasound, lab work, and genetic screening. 20 weeks in-person visit with an anatomy ultrasound  28 weeks in-person office visit for a 1-hour glucose test and a TDAP vaccination 32 weeks in-person office visit 34 weeks telephone visit 36 weeks in-person office visit for GBS, chlamydia, and gonorrhea testing 38 weeks in-person office visit 40 weeks in-person office visit  Understandably, some patients will require more visits than what is outlined above. Additional visits will be determined on a case-by-case basis.   We will, as always, be available for emergencies or to address concerns that might arise between in-person visits. We ask that you allow us the opportunity to address any concerns over the phone or through a virtual visit first. We will be available to return your phone calls throughout the day.   If you are able to purchase a scale, a blood pressure machine, and a home fetal doppler visits could be limited further. This will help decrease your exposure risks, but these purchases are not a necessity.   Things seem to change daily and there is the possibility that this structure could change, please be patient as we adapt to a new way of caring for patients.   Thank you for trusting us with your prenatal care. Our practice values you and looks forward to providing you with excellent care.   Sincerely,   [facility], [facility]      COVID-19 and Your Pregnancy FAQ  How can I prevent infection with COVID-19 during my pregnancy? Social distancing is key. Please limit any interactions in public. Try and work from home if possible. Frequently wash your hands after touching possibly contaminated surfaces. Avoid touching your face.  Minimize trips to the store. Consider online ordering when possible.   Should I wear a mask? YES. It is recommended by the CDC that all people wear a cloth mask or facial covering in public. You should wear a mask to your visits in the office. This will help reduce transmission as well as your risk or acquiring COVID-19. New studies are showing that even asymptomatic individuals can spread the virus from talking.   Where can I get a mask? [place] and the city of [place] are partnering to provide masks to community members. You can pick up a mask from several locations. This website also has instructions about how to make a mask by sewing or without sewing by using a t-shirt or bandana.  https://www.[place].gov/i-want-to/learn-about/covid-19-information-and-updates/covid-19-face-mask-project  Studies have shown that if you were a tube or nylon stocking from pantyhose over a cloth mask it makes the cloth mask almost as effective as a N95 mask.  https://www.npr.org/sections/goatsandsoda/2019/04/09/840146830/adding-a-nylon-stocking-layer-could-boost-protection-from-cloth-masks-study-find  What are the symptoms of COVID-19? Fever (greater than 100.4 F), dry cough, shortness of breath.  Am I more at risk for COVID-19 since I am pregnant? There is not currently data showing that pregnant women are more adversely impacted by COVID-19 than the general population. However,   we know that pregnant women tend to have worse respiratory complications from similar diseases such as the flu and SARS and for this reason should be considered an at-risk population.  What do I do if I am  experiencing the symptoms of COVID-19? Testing is being limited because of test availability. If you are experiencing symptoms you should quarantine yourself, and the members of your family, for at least 2 weeks at home.   Please visit this website for more information: https://www.cdc.gov/coronavirus/2019-ncov/if-you-are-sick/steps-when-sick.html  When should I go to the Emergency Room? Please go to the emergency room if you are experiencing ANY of these symptoms*:  1.    Difficulty breathing or shortness of breath 2.    Persistent pain or pressure in the chest 3.    Confusion or difficulty being aroused (or awakened) 4.    Bluish lips or face  *This list is not all inclusive. Please consult our office for any other symptoms that are severe or concerning.  What do I do if I am having difficulty breathing? You should go to the Emergency Room for evaluation. At this time they have a tent set up for evaluating patients with COVID-19 symptoms.   How will my prenatal care be different because of the COVID-19 pandemic? It has been recommended to reduce the frequency of face-to-face visits and use resources such as telephone and virtual visits when possible. Using a scale, blood pressure machine and fetal doppler at home can further help reduce face-to-face visits. You will be provided with additional information on this topic.  We ask that you come to your visits alone to minimize potential exposures to  COVID-19.  How can I receive childbirth education? At this time in-person classes have been cancelled. You can register for online childbirth education, breastfeeding, and newborn care classes.  Please visit:  [email or web address] for more information  How will my hospital birth experience be different? The hospital is currently limiting visitors. This means that while you are in labor you can only have one person at the hospital with you. Additional family members will not be allowed  to wait in the building or outside your room. Your one support person can be the father of the baby, a relative, a doula, or a friend. Once one support person is designated that person will wear a band. This band cannot be shared with multiple people.  Nitrous Gas is not being offered for pain relief since the tubing and filter for the machine can not be sanitized in a way to guarantee prevention of transmission of COVID-19.  Nasal cannula use of oxygen for fetal indications has also been discontinued.  Currently a clear plastic sheet is being hung between mom and the delivering provider during pushing and delivery to help prevent transmission of COVID-19.      How long will I stay in the hospital for after giving birth? It is also recommended that discharge home be expedited during the COVID-19 outbreak. This means staying for 1 day after a vaginal delivery and 2 days after a cesarean section. Patients who need to stay longer for medical reasons are allowed to do so, but the goal will be for expedited discharge home.   What if I have COVID-19 and I am in labor? We ask that you wear a mask while on labor and delivery. We will try and accommodate you being placed in a room that is capable of filtering the air. Please call ahead if you are in labor and on your   way to the hospital. The phone number for labor and delivery at [facility] is [phone].  If I have COVID-19 when my baby is born how can I prevent my baby from contracting COVID-19? This is an issue that will have to be discussed on a case-by-case basis. Current recommendations suggest providing separate isolation rooms for both the mother and new infant as well as limiting visitors. However, there are practical challenges to this recommendation. The situation will assuredly change and decisions will be influenced by the desires of the mother and availability of space.  Some suggestions are the use of a curtain or  physical barrier between mom and infant, hand hygiene, mom wearing a mask, or 6 feet of spacing between a mom and infant.   Can I breastfeed during the COVID-19 pandemic?   Yes, breastfeeding is encouraged.  Can I breastfeed if I have COVID-19? Yes. Covid-19 has not been found in breast milk. This means you cannot give COVID-19 to your child through breast milk. Breast feeding will also help pass antibodies to fight infection to your baby.   What precautions should I take when breastfeeding if I have COVID-19? If a mother and newborn do room-in and the mother wishes to feed at the breast, she should put on a facemask and practice hand hygiene before each feeding.  What precautions should I take when pumping if I have COVID-19? Prior to expressing breast milk, mothers should practice hand hygiene. After each pumping session, all parts that come into contact with breast milk should be thoroughly washed and the entire pump should be appropriately disinfected per the manufacturer's instructions. This expressed breast milk should be fed to the newborn by a healthy caregiver.  What if I am pregnant and work in healthcare? Based on limited data regarding COVID-19 and pregnancy, ACOG currently does not propose creating additional restrictions on pregnant health care personnel because of COVID-19 alone. Pregnant women do not appear to be at higher risk of severe disease related to COVID-19. Pregnant health care personnel should follow CDC risk assessment and infection control guidelines for health care personnel exposed to patients with suspected or confirmed COVID-19. Adherence to recommended infection prevention and control practices is an important part of protecting all health care personnel in health care settings.    Information on COVID-19 in pregnancy is very limited; however, facilities may want to consider limiting exposure of pregnant health care personnel to patients with confirmed or suspected  COVID-19 infection, especially during higher-risk procedures (eg, aerosol-generating procedures), if feasible, based on staffing availability.    

## 2019-05-20 NOTE — Progress Notes (Signed)
05/20/2019    Chief Complaint: Missed period  Transfer of Care Patient: no  History of Present Illness: Amanda [name] is a 23 y.o. G1P0000 [redacted]w[redacted]d based on Patient's last menstrual period was 04/04/2019 (exact date). with an Estimated Date of Delivery: 01/09/20, with the above CC.   Her periods were: regular periods every 28 days She was using no method when she conceived.  She has Positive signs or symptoms of nausea/vomiting of pregnancy. She has Negative signs or symptoms of miscarriage or preterm labor She was not taking different medications around the time she conceived/early pregnancy. Since her LMP, she has not used alcohol Since her LMP, she has not used tobacco products Since her LMP, she has not used illegal drugs.    She claims she has gained   3-4 pounds since the start of her pregnancy.  Current or past history of domestic violence. no  Infection History:  1. Since her LMP, she has not had a viral illness.  2. She denies close contact with children on a regular basis  no  3. She denies a history of chicken pox, or vaccination for chicken pox in the past. 4. Patient or partner has history of genital herpes  no 5. History of STI (GC, CT, HPV, syphilis, HIV)  no   6.  She does not live with someone with TB or TB exposed. 7. History of recent travel :  no 8. She identifies Negative Zika risk factors for her and her partner 56. There are cats in the home in the home  no  She understands that while pregnant she should not change cat litter.   Genetic Screening Questions: (Includes patient, baby's father, or anyone in either family)   1. Patient's age >/= 21 at [facility]  no 2. Thalassemia ([place], [place], [place], or Asian background): AVW<97  not applicable 3. Neural tube defect (meningomyelocele, spina bifida, anencephaly)  no 4. Congenital heart defect  no  5. Down syndrome  no 6. Tay-Sachs (Jewish, [place])  no 7. Canavan's Disease  no 8. Sickle cell disease or  trait (African)  no  9. Hemophilia or other blood disorders  no  10. Muscular dystrophy  no  11. Cystic fibrosis  no  12. Huntington's Chorea  no  13. Mental retardation/autism  no 14. Other inherited genetic or chromosomal disorder  no 15. Maternal metabolic disorder (DM, PKU, etc)  no 16. Patient or FOB with a child with a birth defect not listed above no  16a. Patient or FOB with a birth defect themselves no 17. Recurrent pregnancy loss, or stillbirth  no  18. Any medications since LMP other than prenatal vitamins (include vitamins, supplements, OTC meds, drugs, alcohol)  no 19. Any other genetic/environmental exposure to discuss  no  ROS:  ROS  OBGYN History: As per HPI. OB History  Gravida Para Term Preterm AB Living  1 0 0 0 0 0  SAB TAB Ectopic Multiple Live Births  0 0 0 0 0    # Outcome Date GA Lbr Len/2nd Weight Sex Delivery Anes PTL Lv  1 Current             Any issues with any prior pregnancies: no Any prior children are healthy, doing well, without any problems or issues: yes  Past Medical History: Past Medical History:  Diagnosis Date  . Migraine     Past Surgical History: Past Surgical History:  Procedure Laterality Date  . NO PAST SURGERIES  Family History:  Family History  Problem Relation Age of Onset  . Migraines Mother   . Skin cancer Father   . Asthma Maternal Grandmother   . Hypertension Maternal Grandmother   . Thyroid disease Maternal Grandmother   . Hypertension Maternal Grandfather   . Hypertension Paternal Grandmother   . Diabetes Paternal Grandmother   . Hypertension Paternal Grandfather   . Diabetes Paternal Grandfather   . Thyroid disease Maternal Aunt    She denies any female cancers, bleeding or blood clotting disorders.  She denies any history of mental retardation, birth defects or genetic disorders in her or the FOB's history  Social History:  Social History   Socioeconomic History  . Marital status: Significant  Other    Spouse name: Not on file  . Number of children: Not on file  . Years of education: Not on file  . Highest education level: Not on file  Occupational History  . Not on file  Social Needs  . Financial resource strain: Not on file  . Food insecurity:    Worry: Not on file    Inability: Not on file  . Transportation needs:    Medical: Not on file    Non-medical: Not on file  Tobacco Use  . Smoking status: Never Smoker  . Smokeless tobacco: Never Used  Substance and Sexual Activity  . Alcohol use: No  . Drug use: No  . Sexual activity: Yes    Birth control/protection: None  Lifestyle  . Physical activity:    Days per week: Not on file    Minutes per session: Not on file  . Stress: Not on file  Relationships  . Social connections:    Talks on phone: Not on file    Gets together: Not on file    Attends religious service: Not on file    Active member of club or organization: Not on file    Attends meetings of clubs or organizations: Not on file    Relationship status: Not on file  . Intimate partner violence:    Fear of current or ex partner: Not on file    Emotionally abused: Not on file    Physically abused: Not on file    Forced sexual activity: Not on file  Other Topics Concern  . Not on file  Social History Narrative   Lives at home w/ her parents   Right-handed   Caffeine: occasional coffee    Allergy: Allergies  Allergen Reactions  . Imitrex [Sumatriptan] Swelling    Facial swelling and burning lips  . Penicillins Rash    Current Outpatient Medications:  Current Outpatient Medications:  .  Prenatal MV & Min w/FA-DHA (ONE A DAY PRENATAL) 0.4-25 MG CHEW, Chew by mouth., Disp: , Rfl:    Physical Exam: Physical Exam could not be performed. Because of the COVID-19 outbreak this visit was performed over the phone and not in person.    Assessment: Amanda [name] is a 23 y.o. G1P0000 [redacted]w[redacted]d based on Patient's last menstrual period was 04/04/2019 (exact  date). with an Estimated Date of Delivery: 01/09/20,  for prenatal care.  Plan:  1) Avoid alcoholic beverages. 2) Patient encouraged not to smoke.  3) Discontinue the use of all non-medicinal drugs and chemicals.  4) Take prenatal vitamins daily.  5) Seatbelt use advised 6) Nutrition, food safety (fish, cheese advisories, and high nitrite foods) and exercise discussed. 7) Hospital and practice style delivering at [facility] discussed  8) Patient is asked about  travel to areas at risk for the Benjamin virus, and counseled to avoid travel and exposure to mosquitoes or sexual partners who may have themselves been exposed to the virus. Testing is discussed, and will be ordered as appropriate.  9) Childbirth classes at [facility] advised 10) Genetic Screening, such as with 1st Trimester Screening, cell free fetal DNA, AFP testing, and Ultrasound, as well as with amniocentesis and CVS as appropriate, is discussed with patient. She plans to have genetic testing this pregnancy.    Found to have a monochorionic pregnancy. Not able to determine amnionisity today. Will need to follow up with [place] in 1-2 weeks.  Arcuate versus subseptate uterus, discussed with patient that sometimes this can be associated with increased risk of miscarriage or preterm delivery.     Problem list reviewed and updated.  I discussed the assessment and treatment plan with the patient. The patient was provided an opportunity to ask questions and all were answered. The patient agreed with the plan and demonstrated an understanding of the instructions.   The patient was advised to call back or seek an in-person evaluation if the symptoms worsen or if the condition fails to improve as anticipated.  I provided 60 minutes of  face-to-face time during this encounter.  Adrian Prows MD [facility], [facility] 05/20/2019 9:39 AM

## 2019-05-21 ENCOUNTER — Encounter: Payer: Commercial Managed Care - PPO | Admitting: Obstetrics and Gynecology

## 2019-05-21 ENCOUNTER — Other Ambulatory Visit: Payer: Self-pay | Admitting: Obstetrics and Gynecology

## 2019-05-21 DIAGNOSIS — O210 Mild hyperemesis gravidarum: Secondary | ICD-10-CM

## 2019-05-21 LAB — RPR+RH+ABO+RUB AB+AB SCR+CB...
Antibody Screen: NEGATIVE
HIV Screen 4th Generation wRfx: NONREACTIVE
Hematocrit: 40.9 % (ref 34.0–46.6)
Hemoglobin: 13.9 g/dL (ref 11.1–15.9)
Hepatitis B Surface Ag: NEGATIVE
MCH: 29.7 pg (ref 26.6–33.0)
MCHC: 34 g/dL (ref 31.5–35.7)
MCV: 87 fL (ref 79–97)
Platelets: 221 10*3/uL (ref 150–450)
RBC: 4.68 x10E6/uL (ref 3.77–5.28)
RDW: 12.3 % (ref 11.7–15.4)
RPR Ser Ql: NONREACTIVE
Rh Factor: POSITIVE
Rubella Antibodies, IGG: 12.1 index (ref >0.99)
Varicella zoster IgG: 610 index (ref >165)
WBC: 6.5 10*3/uL (ref 3.4–10.8)

## 2019-05-21 LAB — MONITOR DRUG PROFILE 10(MW)
Amphetamine Scrn, Ur: NEGATIVE ng/mL
BARBITURATE SCREEN URINE: NEGATIVE ng/mL
BENZODIAZEPINE SCREEN, URINE: NEGATIVE ng/mL
CANNABINOIDS UR QL SCN: NEGATIVE ng/mL
Cocaine (Metab) Scrn, Ur: NEGATIVE ng/mL
Creatinine(Crt), U: 70.9 mg/dL (ref 20.0–300.0)
Methadone Screen, Urine: NEGATIVE ng/mL
OXYCODONE+OXYMORPHONE UR QL SCN: NEGATIVE ng/mL
Opiate Scrn, Ur: NEGATIVE ng/mL
Ph of Urine: 6.4 (ref 4.5–8.9)
Phencyclidine Qn, Ur: NEGATIVE ng/mL
Propoxyphene Scrn, Ur: NEGATIVE ng/mL

## 2019-05-21 MED ORDER — DOXYLAMINE-PYRIDOXINE 10-10 MG PO TBEC: 2.0000 | DELAYED_RELEASE_TABLET | Freq: Every day | ORAL | 5 refills | Status: DC

## 2019-05-21 NOTE — Progress Notes (Signed)
Normal, released to mychart

## 2019-05-22 LAB — URINE CULTURE: Organism ID, Bacteria: NO GROWTH

## 2019-05-23 LAB — CERVICOVAGINAL ANCILLARY ONLY
Chlamydia: NEGATIVE
Neisseria Gonorrhea: NEGATIVE

## 2019-05-26 LAB — CYTOLOGY - PAP
Diagnosis: NEGATIVE
Diagnosis: REACTIVE

## 2019-05-26 NOTE — Progress Notes (Signed)
NIL

## 2019-05-27 ENCOUNTER — Ambulatory Visit (INDEPENDENT_AMBULATORY_CARE_PROVIDER_SITE_OTHER): Payer: Commercial Managed Care - PPO | Admitting: Obstetrics and Gynecology

## 2019-05-27 ENCOUNTER — Ambulatory Visit (INDEPENDENT_AMBULATORY_CARE_PROVIDER_SITE_OTHER): Payer: Commercial Managed Care - PPO

## 2019-05-27 ENCOUNTER — Other Ambulatory Visit: Payer: Self-pay

## 2019-05-27 ENCOUNTER — Encounter: Payer: Self-pay | Admitting: Obstetrics and Gynecology

## 2019-05-27 ENCOUNTER — Other Ambulatory Visit: Payer: Self-pay | Admitting: Obstetrics and Gynecology

## 2019-05-27 VITALS — BP 114/70 | Wt 129.0 lb

## 2019-05-27 DIAGNOSIS — Z3401 Encounter for supervision of normal first pregnancy, first trimester: Secondary | ICD-10-CM

## 2019-05-27 DIAGNOSIS — O30031 Twin pregnancy, monochorionic/diamniotic, first trimester: Secondary | ICD-10-CM | POA: Diagnosis not present

## 2019-05-27 DIAGNOSIS — O30091 Twin pregnancy, unable to determine number of placenta and number of amniotic sacs, first trimester: Secondary | ICD-10-CM

## 2019-05-27 DIAGNOSIS — O3481 Maternal care for other abnormalities of pelvic organs, first trimester: Secondary | ICD-10-CM | POA: Diagnosis not present

## 2019-05-27 DIAGNOSIS — O0990 Supervision of high risk pregnancy, unspecified, unspecified trimester: Secondary | ICD-10-CM

## 2019-05-27 DIAGNOSIS — Z3A01 Less than 8 weeks gestation of pregnancy: Secondary | ICD-10-CM

## 2019-05-27 NOTE — Progress Notes (Signed)
You called it a singleton gestation in your auto phrase
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
## 2019-05-27 NOTE — Patient Instructions (Addendum)
For nausea (these may be purchased over-the-counter): -Vitamin B6 (pyridoxine):  25 mg three times each day (may buy 100 mg tablet and take twice per day or try to cut into 4 equal pieces and take 1 piece three times each day).  - doxylamine (found in [employer] and other sleep agents that can be bought in the store): take 25 - 50 mg at bedtime.  May take up to 25 mg three time each day.  However, keep in mind that this might make you sleepy.

## 2019-06-03 ENCOUNTER — Encounter: Payer: Self-pay | Admitting: Obstetrics and Gynecology

## 2019-06-16 ENCOUNTER — Other Ambulatory Visit: Payer: Self-pay

## 2019-06-16 ENCOUNTER — Emergency Department
Admission: EM | Admit: 2019-06-16 | Discharge: 2019-06-17 | Disposition: A | Discharge status: Home / Self Care | Payer: Commercial Managed Care - PPO | Attending: Emergency Medicine | Admitting: Emergency Medicine

## 2019-06-16 DIAGNOSIS — N949 Unspecified condition associated with female genital organs and menstrual cycle: Secondary | ICD-10-CM

## 2019-06-16 DIAGNOSIS — O30031 Twin pregnancy, monochorionic/diamniotic, first trimester: Secondary | ICD-10-CM | POA: Insufficient documentation

## 2019-06-16 DIAGNOSIS — O26891 Other specified pregnancy related conditions, first trimester: Secondary | ICD-10-CM | POA: Diagnosis not present

## 2019-06-16 DIAGNOSIS — Z3A10 10 weeks gestation of pregnancy: Secondary | ICD-10-CM | POA: Diagnosis not present

## 2019-06-16 DIAGNOSIS — R102 Pelvic and perineal pain: Secondary | ICD-10-CM | POA: Diagnosis not present

## 2019-06-16 DIAGNOSIS — O9989 Other specified diseases and conditions complicating pregnancy, childbirth and the puerperium: Secondary | ICD-10-CM | POA: Diagnosis present

## 2019-06-16 LAB — URINALYSIS, ROUTINE W REFLEX MICROSCOPIC
Bilirubin Urine: NEGATIVE
Glucose, UA: NEGATIVE mg/dL
Hgb urine dipstick: NEGATIVE
Ketones, ur: NEGATIVE mg/dL
Leukocytes,Ua: NEGATIVE
Nitrite: NEGATIVE
Protein, ur: NEGATIVE mg/dL
Specific Gravity, Urine: 1.005 (ref 1.005–1.030)
pH: 7 (ref 5.0–8.0)

## 2019-06-16 LAB — COMPREHENSIVE METABOLIC PANEL
ALT: 8 U/L (ref 0–44)
AST: 14 U/L — ABNORMAL LOW (ref 15–41)
Albumin: 4 g/dL (ref 3.5–5.0)
Alkaline Phosphatase: 37 U/L — ABNORMAL LOW (ref 38–126)
Anion gap: 6 (ref 5–15)
BUN: 8 mg/dL (ref 6–20)
CO2: 26 mmol/L (ref 22–32)
Calcium: 8.6 mg/dL — ABNORMAL LOW (ref 8.9–10.3)
Chloride: 104 mmol/L (ref 98–111)
Creatinine, Ser: 0.57 mg/dL (ref 0.44–1.00)
GFR calc Af Amer: >60 mL/min (ref >60)
GFR calc non Af Amer: >60 mL/min (ref >60)
Glucose, Bld: 117 mg/dL — ABNORMAL HIGH (ref 70–99)
Potassium: 3.2 mmol/L — ABNORMAL LOW (ref 3.5–5.1)
Sodium: 136 mmol/L (ref 135–145)
Total Bilirubin: 0.6 mg/dL (ref 0.3–1.2)
Total Protein: 6.9 g/dL (ref 6.5–8.1)

## 2019-06-16 LAB — CBC
HCT: 36.1 % (ref 36.0–46.0)
Hemoglobin: 12.6 g/dL (ref 12.0–15.0)
MCH: 30.1 pg (ref 26.0–34.0)
MCHC: 34.9 g/dL (ref 30.0–36.0)
MCV: 86.2 fL (ref 80.0–100.0)
Platelets: 225 10*3/uL (ref 150–400)
RBC: 4.19 MIL/uL (ref 3.87–5.11)
RDW: 11.6 % (ref 11.5–15.5)
WBC: 8.3 10*3/uL (ref 4.0–10.5)
nRBC: 0 % (ref 0.0–0.2)

## 2019-06-16 LAB — HCG, QUANTITATIVE, PREGNANCY: hCG, Beta Chain, Quant, S: 184293 m[IU]/mL — ABNORMAL HIGH (ref <5)

## 2019-06-16 LAB — PREGNANCY, URINE: Preg Test, Ur: POSITIVE — AB

## 2019-06-16 NOTE — ED Triage Notes (Signed)
Pt c/o pelvic pain that is intermittent and shooting. Pt states she has had this pain for 2 wks. Pt called OB/GYN and was told to come to ED if she could not tolerate the pain. Pt states she thought the pain was r/t returning to work. 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 Pt denies fluid leaking or vaginal bleeding. Pt is G1P0A0. Pt has taken no meds for pain, states she was unsure of how much or what she could take.

## 2019-06-17 ENCOUNTER — Encounter: Payer: Self-pay | Admitting: Obstetrics and Gynecology

## 2019-06-17 ENCOUNTER — Ambulatory Visit (INDEPENDENT_AMBULATORY_CARE_PROVIDER_SITE_OTHER): Payer: Commercial Managed Care - PPO | Admitting: Obstetrics and Gynecology

## 2019-06-17 VITALS — BP 110/70 | Wt 126.0 lb

## 2019-06-17 DIAGNOSIS — O0991 Supervision of high risk pregnancy, unspecified, first trimester: Secondary | ICD-10-CM

## 2019-06-17 DIAGNOSIS — O30031 Twin pregnancy, monochorionic/diamniotic, first trimester: Secondary | ICD-10-CM

## 2019-06-17 DIAGNOSIS — N926 Irregular menstruation, unspecified: Secondary | ICD-10-CM

## 2019-06-17 DIAGNOSIS — Z3A10 10 weeks gestation of pregnancy: Secondary | ICD-10-CM

## 2019-06-17 DIAGNOSIS — O0990 Supervision of high risk pregnancy, unspecified, unspecified trimester: Secondary | ICD-10-CM

## 2019-06-17 DIAGNOSIS — O210 Mild hyperemesis gravidarum: Secondary | ICD-10-CM

## 2019-06-17 DIAGNOSIS — Z1379 Encounter for other screening for genetic and chromosomal anomalies: Secondary | ICD-10-CM

## 2019-06-17 DIAGNOSIS — Z5320 Procedure and treatment not carried out because of patient's decision for unspecified reasons: Secondary | ICD-10-CM

## 2019-06-17 MED ORDER — PROMETHAZINE HCL 25 MG PO TABS: 25.0000 mg | ORAL_TABLET | Freq: Four times a day (QID) | ORAL | 6 refills | Status: DC | PRN

## 2019-06-17 MED ORDER — MECLIZINE HCL 25 MG PO TABS: 25.0000 mg | ORAL_TABLET | Freq: Four times a day (QID) | ORAL | 6 refills | Status: DC

## 2019-06-17 NOTE — Discharge Instructions (Signed)
Take tylenol 650mg  every 4 hours, or 1000mg  every 6-8 hours as needed for pain.

## 2019-06-17 NOTE — Progress Notes (Signed)
ROB C/o nausea and no appetite
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
## 2019-06-17 NOTE — Patient Instructions (Signed)
Monochorionic Diamniotic Twin Pregnancy Plan  Mom should take 60-120mg  of elemental iron a day as well as 1mg  of folic acid  47-34 wks: Evaluate for early growth discordance between  16 wks: Begin Ultrasounds for TTTS checks every 2 weeks (monitor MVP* for each twin and visualize the bladder) 18-20 wks:  Anatomy Ultrasound 22 wks: Fetal Echo 20-Delivery: Growth Ultrasounds every 2-4 weeks** 32 wks: Initiate NSTs  Delivery No complications: 34 0/7 - 37 6/7 Isolated IUGR: 32 0/7- 34 6/7  Genetic screening offered with first trimester screen (11-14 wks) or CVS sampling (10-12 wks)
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
## 2019-06-22 LAB — MATERNIT 21 PLUS CORE, BLOOD
Fetal Fraction: 14
Result (T21): NEGATIVE
Trisomy 13 (Patau syndrome): NEGATIVE
Trisomy 18 (Edwards syndrome): NEGATIVE
Trisomy 21 (Down syndrome): NEGATIVE

## 2019-07-01 ENCOUNTER — Ambulatory Visit (INDEPENDENT_AMBULATORY_CARE_PROVIDER_SITE_OTHER): Payer: Medicaid Other

## 2019-07-01 ENCOUNTER — Ambulatory Visit (INDEPENDENT_AMBULATORY_CARE_PROVIDER_SITE_OTHER): Payer: Medicaid Other | Admitting: Maternal Newborn

## 2019-07-01 ENCOUNTER — Encounter: Payer: Self-pay | Admitting: Maternal Newborn

## 2019-07-01 ENCOUNTER — Other Ambulatory Visit: Payer: Self-pay

## 2019-07-01 ENCOUNTER — Other Ambulatory Visit: Payer: Self-pay | Admitting: Maternal Newborn

## 2019-07-01 VITALS — BP 96/54 | Wt 127.0 lb

## 2019-07-01 DIAGNOSIS — Z1379 Encounter for other screening for genetic and chromosomal anomalies: Secondary | ICD-10-CM

## 2019-07-01 DIAGNOSIS — Z3682 Encounter for antenatal screening for nuchal translucency: Secondary | ICD-10-CM | POA: Diagnosis not present

## 2019-07-01 DIAGNOSIS — O0990 Supervision of high risk pregnancy, unspecified, unspecified trimester: Secondary | ICD-10-CM

## 2019-07-01 DIAGNOSIS — O30031 Twin pregnancy, monochorionic/diamniotic, first trimester: Secondary | ICD-10-CM

## 2019-07-01 DIAGNOSIS — O30009 Twin pregnancy, unspecified number of placenta and unspecified number of amniotic sacs, unspecified trimester: Secondary | ICD-10-CM

## 2019-07-01 DIAGNOSIS — Z3A12 12 weeks gestation of pregnancy: Secondary | ICD-10-CM

## 2019-07-01 DIAGNOSIS — O0991 Supervision of high risk pregnancy, unspecified, first trimester: Secondary | ICD-10-CM

## 2019-07-01 DIAGNOSIS — Z5320 Procedure and treatment not carried out because of patient's decision for unspecified reasons: Secondary | ICD-10-CM
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
## 2019-07-01 NOTE — Progress Notes (Signed)
ROB Ultrasound

## 2019-07-01 NOTE — Patient Instructions (Signed)
First Trimester of Pregnancy The first trimester of pregnancy is from week 1 until the end of week 13 (months 1 through 3). A week after a sperm fertilizes an egg, the egg will implant on the wall of the uterus. This embryo will begin to develop into a baby. Genes from you and your partner will form the baby. The female genes will determine whether the baby will be a boy or a girl. At 6-8 weeks, the eyes and face will be formed, and the heartbeat can be seen on ultrasound. At the end of 12 weeks, all the baby's organs will be formed. Now that you are pregnant, you will want to do everything you can to have a healthy baby. Two of the most important things are to get good prenatal care and to follow your health care provider's instructions. Prenatal care is all the medical care you receive before the baby's birth. This care will help prevent, find, and treat any problems during the pregnancy and childbirth. Body changes during your first trimester Your body goes through many changes during pregnancy. The changes vary from woman to woman.  You may gain or lose a couple of pounds at first.  You may feel sick to your stomach (nauseous) and you may throw up (vomit). If the vomiting is uncontrollable, call your health care provider.  You may tire easily.  You may develop headaches that can be relieved by medicines. All medicines should be approved by your health care provider.  You may urinate more often. Painful urination may mean you have a bladder infection.  You may develop heartburn as a result of your pregnancy.  You may develop constipation because certain hormones are causing the muscles that push stool through your intestines to slow down.  You may develop hemorrhoids or swollen veins (varicose veins).  Your breasts may begin to grow larger and become tender. Your nipples may stick out more, and the tissue that surrounds them (areola) may become darker.  Your gums may bleed and may be  sensitive to brushing and flossing.  Dark spots or blotches (chloasma, mask of pregnancy) may develop on your face. This will likely fade after the baby is born.  Your menstrual periods will stop.  You may have a loss of appetite.  You may develop cravings for certain kinds of food.  You may have changes in your emotions from day to day, such as being excited to be pregnant or being concerned that something may go wrong with the pregnancy and baby.  You may have more vivid and strange dreams.  You may have changes in your hair. These can include thickening of your hair, rapid growth, and changes in texture. Some women also have hair loss during or after pregnancy, or hair that feels dry or thin. Your hair will most likely return to normal after your baby is born. What to expect at prenatal visits During a routine prenatal visit:  You will be weighed to make sure you and the baby are growing normally.  Your blood pressure will be taken.  Your abdomen will be measured to track your baby's growth.  The fetal heartbeat will be listened to between weeks 10 and 14 of your pregnancy.  Test results from any previous visits will be discussed. Your health care provider may ask you:  How you are feeling.  If you are feeling the baby move.  If you have had any abnormal symptoms, such as leaking fluid, bleeding, severe headaches, or abdominal   cramping.  If you are using any tobacco products, including cigarettes, chewing tobacco, and electronic cigarettes.  If you have any questions. Other tests that may be performed during your first trimester include:  Blood tests to find your blood type and to check for the presence of any previous infections. The tests will also be used to check for low iron levels (anemia) and protein on red blood cells (Rh antibodies). Depending on your risk factors, or if you previously had diabetes during pregnancy, you may have tests to check for high blood sugar  that affects pregnant women (gestational diabetes).  Urine tests to check for infections, diabetes, or protein in the urine.  An ultrasound to confirm the proper growth and development of the baby.  Fetal screens for spinal cord problems (spina bifida) and Down syndrome.  HIV (human immunodeficiency virus) testing. Routine prenatal testing includes screening for HIV, unless you choose not to have this test.  You may need other tests to make sure you and the baby are doing well. Follow these instructions at home: Medicines  Follow your health care provider's instructions regarding medicine use. Specific medicines may be either safe or unsafe to take during pregnancy.  Take a prenatal vitamin that contains at least 600 micrograms (mcg) of folic acid.  If you develop constipation, try taking a stool softener if your health care provider approves. Eating and drinking   Eat a balanced diet that includes fresh fruits and vegetables, whole grains, good sources of protein such as meat, eggs, or tofu, and low-fat dairy. Your health care provider will help you determine the amount of weight gain that is right for you.  Avoid raw meat and uncooked cheese. These carry germs that can cause birth defects in the baby.  Eating four or five small meals rather than three large meals a day may help relieve nausea and vomiting. If you start to feel nauseous, eating a few soda crackers can be helpful. Drinking liquids between meals, instead of during meals, also seems to help ease nausea and vomiting.  Limit foods that are high in fat and processed sugars, such as fried and sweet foods.  To prevent constipation: ? Eat foods that are high in fiber, such as fresh fruits and vegetables, whole grains, and beans. ? Drink enough fluid to keep your urine clear or pale yellow. Activity  Exercise only as directed by your health care provider. Most women can continue their usual exercise routine during  pregnancy. Try to exercise for 30 minutes at least 5 days a week. Exercising will help you: ? Control your weight. ? Stay in shape. ? Be prepared for labor and delivery.  Experiencing pain or cramping in the lower abdomen or lower back is a good sign that you should stop exercising. Check with your health care provider before continuing with normal exercises.  Try to avoid standing for long periods of time. Move your legs often if you must stand in one place for a long time.  Avoid heavy lifting.  Wear low-heeled shoes and practice good posture.  You may continue to have sex unless your health care provider tells you not to. Relieving pain and discomfort  Wear a good support bra to relieve breast tenderness.  Take warm sitz baths to soothe any pain or discomfort caused by hemorrhoids. Use hemorrhoid cream if your health care provider approves.  Rest with your legs elevated if you have leg cramps or low back pain.  If you develop varicose veins in   your legs, wear support hose. Elevate your feet for 15 minutes, 3-4 times a day. Limit salt in your diet. Prenatal care  Schedule your prenatal visits by the twelfth week of pregnancy. They are usually scheduled monthly at first, then more often in the last 2 months before delivery.  Write down your questions. Take them to your prenatal visits.  Keep all your prenatal visits as told by your health care provider. This is important. Safety  Wear your seat belt at all times when driving.  Make a list of emergency phone numbers, including numbers for family, friends, the hospital, and police and fire departments. General instructions  Ask your health care provider for a referral to a local prenatal education class. Begin classes no later than the beginning of month 6 of your pregnancy.  Ask for help if you have counseling or nutritional needs during pregnancy. Your health care provider can offer advice or refer you to specialists for help  with various needs.  Do not use hot tubs, steam rooms, or saunas.  Do not douche or use tampons or scented sanitary pads.  Do not cross your legs for long periods of time.  Avoid cat litter boxes and soil used by cats. These carry germs that can cause birth defects in the baby and possibly loss of the fetus by miscarriage or stillbirth.  Avoid all smoking, herbs, alcohol, and medicines not prescribed by your health care provider. Chemicals in these products affect the formation and growth of the baby.  Do not use any products that contain nicotine or tobacco, such as cigarettes and e-cigarettes. If you need help quitting, ask your health care provider. You may receive counseling support and other resources to help you quit.  Schedule a dentist appointment. At home, brush your teeth with a soft toothbrush and be gentle when you floss. Contact a health care provider if:  You have dizziness.  You have mild pelvic cramps, pelvic pressure, or nagging pain in the abdominal area.  You have persistent nausea, vomiting, or diarrhea.  You have a bad smelling vaginal discharge.  You have pain when you urinate.  You notice increased swelling in your face, hands, legs, or ankles.  You are exposed to fifth disease or chickenpox.  You are exposed to German measles (rubella) and have never had it. Get help right away if:  You have a fever.  You are leaking fluid from your vagina.  You have spotting or bleeding from your vagina.  You have severe abdominal cramping or pain.  You have rapid weight gain or loss.  You vomit blood or material that looks like coffee grounds.  You develop a severe headache.  You have shortness of breath.  You have any kind of trauma, such as from a fall or a car accident. Summary  The first trimester of pregnancy is from week 1 until the end of week 13 (months 1 through 3).  Your body goes through many changes during pregnancy. The changes vary from  woman to woman.  You will have routine prenatal visits. During those visits, your health care provider will examine you, discuss any test results you may have, and talk with you about how you are feeling. This information is not intended to replace advice given to you by your health care provider. Make sure you discuss any questions you have with your health care provider. Document Released: 11/28/2001 Document Revised: 11/16/2017 Document Reviewed: 11/15/2016 Elsevier Patient Education  2020 [employer]  

## 2019-07-03 ENCOUNTER — Telehealth: Payer: Self-pay

## 2019-07-03 LAB — FIRST TRIMESTER SCREEN W/NT
CRL: 62.1 mm
Crown Rump Length Twin B: 61.8 mm
DIA MoM: 2.07
DIA Value: 544.8 pg/mL
Gest Age-Collect: 12.4 weeks
Maternal Age At EDD: 21.9 yr
NT MoM Twin B: 0.97
NT Twin B: 1.4 mm
Nuchal Translucency MoM: 1.31
Nuchal Translucency: 1.9 mm
Number of Fetuses: 2
PAPP-A MoM: 0.96
PAPP-A Value: 1203 ng/mL
Sonographer ID#: 83416
Weight: 127 [lb_av]
hCG MoM: 1.37
hCG Value: 142.3 IU/mL

## 2019-07-03 NOTE — Telephone Encounter (Signed)
Vivien Rota from [facility] calling for ultrasonographers NTQR or MFM number.  Mardene Celeste aware Tye Savoy MFM # [phone].  They will get this added and get report of First Trimester Screen to [place].

## 2019-07-07 ENCOUNTER — Other Ambulatory Visit: Payer: Self-pay

## 2019-07-07 ENCOUNTER — Other Ambulatory Visit: Payer: Self-pay | Admitting: Maternal & Fetal Medicine

## 2019-07-07 DIAGNOSIS — O30031 Twin pregnancy, monochorionic/diamniotic, first trimester: Secondary | ICD-10-CM

## 2019-07-10 ENCOUNTER — Ambulatory Visit: Payer: Medicaid Other

## 2019-07-10 ENCOUNTER — Inpatient Hospital Stay
Admission: RE | Admit: 2019-07-10 | Discharge: 2019-07-10 | Disposition: A | Discharge status: Home / Self Care | Payer: Medicaid Other | Source: Ambulatory Visit

## 2019-07-22 ENCOUNTER — Encounter: Payer: Self-pay | Admitting: Obstetrics and Gynecology

## 2019-07-22 ENCOUNTER — Other Ambulatory Visit: Payer: Self-pay

## 2019-07-22 ENCOUNTER — Ambulatory Visit (INDEPENDENT_AMBULATORY_CARE_PROVIDER_SITE_OTHER): Payer: Medicaid Other | Admitting: Obstetrics and Gynecology

## 2019-07-22 VITALS — BP 104/74 | Wt 134.0 lb

## 2019-07-22 DIAGNOSIS — Z3A15 15 weeks gestation of pregnancy: Secondary | ICD-10-CM

## 2019-07-22 DIAGNOSIS — R300 Dysuria: Secondary | ICD-10-CM

## 2019-07-22 DIAGNOSIS — O30032 Twin pregnancy, monochorionic/diamniotic, second trimester: Secondary | ICD-10-CM

## 2019-07-22 DIAGNOSIS — O9989 Other specified diseases and conditions complicating pregnancy, childbirth and the puerperium: Secondary | ICD-10-CM

## 2019-07-22 DIAGNOSIS — O0990 Supervision of high risk pregnancy, unspecified, unspecified trimester: Secondary | ICD-10-CM

## 2019-07-22 LAB — POCT URINALYSIS DIPSTICK
Bilirubin, UA: NEGATIVE
Blood, UA: NEGATIVE
Glucose, UA: NEGATIVE
Ketones, UA: NEGATIVE
Nitrite, UA: NEGATIVE
Protein, UA: NEGATIVE
Spec Grav, UA: 1.025 (ref 1.010–1.025)
Urobilinogen, UA: NEGATIVE E.U./dL — AB
pH, UA: 6.5 (ref 5.0–8.0)
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
## 2019-07-23 ENCOUNTER — Other Ambulatory Visit: Payer: Self-pay | Admitting: Obstetrics and Gynecology

## 2019-07-23 DIAGNOSIS — N3000 Acute cystitis without hematuria: Secondary | ICD-10-CM

## 2019-07-23 MED ORDER — NITROFURANTOIN MONOHYD MACRO 100 MG PO CAPS: 100.0000 mg | ORAL_CAPSULE | Freq: Two times a day (BID) | ORAL | 0 refills | Status: DC

## 2019-07-23 NOTE — Telephone Encounter (Signed)
Just saw this now

## 2019-07-24 ENCOUNTER — Other Ambulatory Visit: Payer: Self-pay

## 2019-07-24 DIAGNOSIS — O30031 Twin pregnancy, monochorionic/diamniotic, first trimester: Secondary | ICD-10-CM

## 2019-07-24 LAB — URINE CULTURE

## 2019-07-28 ENCOUNTER — Other Ambulatory Visit: Payer: Self-pay | Admitting: Obstetrics and Gynecology

## 2019-07-28 ENCOUNTER — Ambulatory Visit
Admission: RE | Admit: 2019-07-28 | Discharge: 2019-07-28 | Disposition: A | Discharge status: Home / Self Care | Payer: Medicaid Other | Source: Ambulatory Visit | Attending: Maternal & Fetal Medicine | Admitting: Maternal & Fetal Medicine

## 2019-07-28 ENCOUNTER — Other Ambulatory Visit: Payer: Self-pay

## 2019-07-28 DIAGNOSIS — Z3A16 16 weeks gestation of pregnancy: Secondary | ICD-10-CM | POA: Diagnosis not present

## 2019-07-28 DIAGNOSIS — O30031 Twin pregnancy, monochorionic/diamniotic, first trimester: Secondary | ICD-10-CM

## 2019-07-31 ENCOUNTER — Other Ambulatory Visit: Payer: Self-pay

## 2019-08-04 ENCOUNTER — Encounter: Payer: Self-pay | Admitting: Maternal Newborn

## 2019-08-04 ENCOUNTER — Other Ambulatory Visit: Payer: Self-pay

## 2019-08-04 ENCOUNTER — Ambulatory Visit (INDEPENDENT_AMBULATORY_CARE_PROVIDER_SITE_OTHER): Payer: Medicaid Other | Admitting: Maternal Newborn

## 2019-08-04 VITALS — BP 100/60 | Wt 133.0 lb

## 2019-08-04 DIAGNOSIS — O26892 Other specified pregnancy related conditions, second trimester: Secondary | ICD-10-CM

## 2019-08-04 DIAGNOSIS — Z3A17 17 weeks gestation of pregnancy: Secondary | ICD-10-CM

## 2019-08-04 DIAGNOSIS — R519 Headache, unspecified: Secondary | ICD-10-CM

## 2019-08-04 DIAGNOSIS — R51 Headache: Secondary | ICD-10-CM

## 2019-08-04 DIAGNOSIS — O26899 Other specified pregnancy related conditions, unspecified trimester: Secondary | ICD-10-CM

## 2019-08-04 MED ORDER — PROCHLORPERAZINE MALEATE 10 MG PO TABS: 10.0000 mg | ORAL_TABLET | Freq: Four times a day (QID) | ORAL | 0 refills | Status: DC | PRN
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
## 2019-08-11 ENCOUNTER — Telehealth: Payer: Self-pay

## 2019-08-11 NOTE — Telephone Encounter (Signed)
Patient inquiring if Avel Sensor, CNM had sent in rx for headaches. [phone]

## 2019-08-11 NOTE — Telephone Encounter (Signed)
Notified patient per chart review rx was sent & received by pharmacy on date she was seen 08/04/2019

## 2019-08-13 ENCOUNTER — Encounter: Payer: Self-pay | Admitting: Obstetrics and Gynecology

## 2019-08-13 ENCOUNTER — Ambulatory Visit (INDEPENDENT_AMBULATORY_CARE_PROVIDER_SITE_OTHER): Payer: Medicaid Other | Admitting: Obstetrics and Gynecology

## 2019-08-13 ENCOUNTER — Other Ambulatory Visit: Payer: Self-pay

## 2019-08-13 VITALS — BP 108/60 | Wt 138.0 lb

## 2019-08-13 DIAGNOSIS — O0990 Supervision of high risk pregnancy, unspecified, unspecified trimester: Secondary | ICD-10-CM

## 2019-08-13 DIAGNOSIS — Z3A18 18 weeks gestation of pregnancy: Secondary | ICD-10-CM

## 2019-08-13 DIAGNOSIS — Z23 Encounter for immunization: Secondary | ICD-10-CM | POA: Diagnosis not present

## 2019-08-13 DIAGNOSIS — O30032 Twin pregnancy, monochorionic/diamniotic, second trimester: Secondary | ICD-10-CM
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
## 2019-08-14 ENCOUNTER — Other Ambulatory Visit: Payer: Self-pay

## 2019-08-14 DIAGNOSIS — O30031 Twin pregnancy, monochorionic/diamniotic, first trimester: Secondary | ICD-10-CM

## 2019-08-18 ENCOUNTER — Other Ambulatory Visit: Payer: Self-pay

## 2019-08-18 ENCOUNTER — Ambulatory Visit
Admission: RE | Admit: 2019-08-18 | Discharge: 2019-08-18 | Disposition: A | Discharge status: Home / Self Care | Payer: Medicaid Other | Source: Ambulatory Visit | Attending: Obstetrics and Gynecology | Admitting: Obstetrics and Gynecology

## 2019-08-18 DIAGNOSIS — O30031 Twin pregnancy, monochorionic/diamniotic, first trimester: Secondary | ICD-10-CM

## 2019-08-18 DIAGNOSIS — Z3A19 19 weeks gestation of pregnancy: Secondary | ICD-10-CM | POA: Diagnosis not present

## 2019-08-18 DIAGNOSIS — O30032 Twin pregnancy, monochorionic/diamniotic, second trimester: Secondary | ICD-10-CM | POA: Insufficient documentation

## 2019-08-28 ENCOUNTER — Other Ambulatory Visit: Payer: Self-pay

## 2019-08-28 DIAGNOSIS — O30032 Twin pregnancy, monochorionic/diamniotic, second trimester: Secondary | ICD-10-CM

## 2019-09-01 ENCOUNTER — Ambulatory Visit
Admission: RE | Admit: 2019-09-01 | Discharge: 2019-09-01 | Disposition: A | Discharge status: Home / Self Care | Payer: Medicaid Other | Source: Ambulatory Visit | Attending: Maternal & Fetal Medicine | Admitting: Maternal & Fetal Medicine

## 2019-09-01 ENCOUNTER — Other Ambulatory Visit: Payer: Self-pay

## 2019-09-01 DIAGNOSIS — O321XX1 Maternal care for breech presentation, fetus 1: Secondary | ICD-10-CM | POA: Diagnosis not present

## 2019-09-01 DIAGNOSIS — O30032 Twin pregnancy, monochorionic/diamniotic, second trimester: Secondary | ICD-10-CM | POA: Insufficient documentation

## 2019-09-01 DIAGNOSIS — O43022 Fetus-to-fetus placental transfusion syndrome, second trimester: Secondary | ICD-10-CM | POA: Insufficient documentation

## 2019-09-01 DIAGNOSIS — Z3A21 21 weeks gestation of pregnancy: Secondary | ICD-10-CM | POA: Diagnosis not present

## 2019-09-01 DIAGNOSIS — O321XX2 Maternal care for breech presentation, fetus 2: Secondary | ICD-10-CM | POA: Insufficient documentation

## 2019-09-08 DIAGNOSIS — O30032 Twin pregnancy, monochorionic/diamniotic, second trimester: Secondary | ICD-10-CM | POA: Diagnosis not present

## 2019-09-08 DIAGNOSIS — O359XX2 Maternal care for (suspected) fetal abnormality and damage, unspecified, fetus 2: Secondary | ICD-10-CM | POA: Diagnosis not present

## 2019-09-08 DIAGNOSIS — Z3A22 22 weeks gestation of pregnancy: Secondary | ICD-10-CM | POA: Diagnosis not present

## 2019-09-08 DIAGNOSIS — O359XX1 Maternal care for (suspected) fetal abnormality and damage, unspecified, fetus 1: Secondary | ICD-10-CM | POA: Diagnosis not present

## 2019-09-08 DIAGNOSIS — Z3689 Encounter for other specified antenatal screening: Secondary | ICD-10-CM | POA: Diagnosis not present

## 2019-09-09 ENCOUNTER — Other Ambulatory Visit: Payer: Self-pay

## 2019-09-09 ENCOUNTER — Encounter: Payer: Self-pay | Admitting: Obstetrics and Gynecology

## 2019-09-09 ENCOUNTER — Ambulatory Visit (INDEPENDENT_AMBULATORY_CARE_PROVIDER_SITE_OTHER): Payer: Medicaid Other | Admitting: Obstetrics and Gynecology

## 2019-09-09 VITALS — BP 110/64 | Wt 144.0 lb

## 2019-09-09 DIAGNOSIS — Z23 Encounter for immunization: Secondary | ICD-10-CM

## 2019-09-09 DIAGNOSIS — O0990 Supervision of high risk pregnancy, unspecified, unspecified trimester: Secondary | ICD-10-CM

## 2019-09-09 DIAGNOSIS — Z3A22 22 weeks gestation of pregnancy: Secondary | ICD-10-CM

## 2019-09-09 DIAGNOSIS — Z131 Encounter for screening for diabetes mellitus: Secondary | ICD-10-CM

## 2019-09-09 DIAGNOSIS — Z113 Encounter for screening for infections with a predominantly sexual mode of transmission: Secondary | ICD-10-CM

## 2019-09-09 DIAGNOSIS — O30032 Twin pregnancy, monochorionic/diamniotic, second trimester: Secondary | ICD-10-CM
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
## 2019-09-11 ENCOUNTER — Other Ambulatory Visit: Payer: Self-pay

## 2019-09-11 DIAGNOSIS — O30032 Twin pregnancy, monochorionic/diamniotic, second trimester: Secondary | ICD-10-CM

## 2019-09-15 ENCOUNTER — Ambulatory Visit
Admission: RE | Admit: 2019-09-15 | Discharge: 2019-09-15 | Disposition: A | Discharge status: Home / Self Care | Payer: Medicaid Other | Source: Ambulatory Visit | Attending: Maternal & Fetal Medicine | Admitting: Maternal & Fetal Medicine

## 2019-09-15 ENCOUNTER — Other Ambulatory Visit: Payer: Self-pay

## 2019-09-15 DIAGNOSIS — Z3A23 23 weeks gestation of pregnancy: Secondary | ICD-10-CM | POA: Insufficient documentation

## 2019-09-15 DIAGNOSIS — O30032 Twin pregnancy, monochorionic/diamniotic, second trimester: Secondary | ICD-10-CM | POA: Diagnosis present

## 2019-09-25 ENCOUNTER — Other Ambulatory Visit: Payer: Self-pay

## 2019-09-25 ENCOUNTER — Other Ambulatory Visit: Payer: Self-pay | Admitting: Maternal & Fetal Medicine

## 2019-09-25 DIAGNOSIS — O30032 Twin pregnancy, monochorionic/diamniotic, second trimester: Secondary | ICD-10-CM

## 2019-09-29 ENCOUNTER — Ambulatory Visit
Admission: RE | Admit: 2019-09-29 | Discharge: 2019-09-29 | Disposition: A | Discharge status: Home / Self Care | Payer: Medicaid Other | Source: Ambulatory Visit | Attending: Obstetrics and Gynecology | Admitting: Obstetrics and Gynecology

## 2019-09-29 ENCOUNTER — Other Ambulatory Visit: Payer: Self-pay

## 2019-09-29 DIAGNOSIS — Z3A25 25 weeks gestation of pregnancy: Secondary | ICD-10-CM | POA: Diagnosis not present

## 2019-09-29 DIAGNOSIS — O30032 Twin pregnancy, monochorionic/diamniotic, second trimester: Secondary | ICD-10-CM | POA: Diagnosis not present

## 2019-10-07 ENCOUNTER — Other Ambulatory Visit: Payer: Medicaid Other

## 2019-10-07 ENCOUNTER — Ambulatory Visit (INDEPENDENT_AMBULATORY_CARE_PROVIDER_SITE_OTHER): Payer: Medicaid Other | Admitting: Obstetrics and Gynecology

## 2019-10-07 ENCOUNTER — Other Ambulatory Visit: Payer: Self-pay

## 2019-10-07 VITALS — BP 122/62 | Wt 151.0 lb

## 2019-10-07 DIAGNOSIS — Z131 Encounter for screening for diabetes mellitus: Secondary | ICD-10-CM | POA: Diagnosis not present

## 2019-10-07 DIAGNOSIS — Z113 Encounter for screening for infections with a predominantly sexual mode of transmission: Secondary | ICD-10-CM | POA: Diagnosis not present

## 2019-10-07 DIAGNOSIS — Z3A26 26 weeks gestation of pregnancy: Secondary | ICD-10-CM

## 2019-10-07 DIAGNOSIS — O0992 Supervision of high risk pregnancy, unspecified, second trimester: Secondary | ICD-10-CM

## 2019-10-07 DIAGNOSIS — O0990 Supervision of high risk pregnancy, unspecified, unspecified trimester: Secondary | ICD-10-CM

## 2019-10-07 DIAGNOSIS — O30032 Twin pregnancy, monochorionic/diamniotic, second trimester: Secondary | ICD-10-CM

## 2019-10-07 LAB — POCT URINALYSIS DIPSTICK OB
Glucose, UA: NEGATIVE
POC,PROTEIN,UA: NEGATIVE

## 2019-10-07 NOTE — Progress Notes (Signed)
ROB GTT today
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
## 2019-10-08 LAB — 28 WEEK RH+PANEL
Basophils Absolute: 0 10*3/uL (ref 0.0–0.2)
Basos: 0 %
EOS (ABSOLUTE): 0.1 10*3/uL (ref 0.0–0.4)
Eos: 1 %
Gestational Diabetes Screen: 91 mg/dL (ref 65–139)
HIV Screen 4th Generation wRfx: NONREACTIVE
Hematocrit: 36.2 % (ref 34.0–46.6)
Hemoglobin: 12.3 g/dL (ref 11.1–15.9)
Immature Grans (Abs): 0.1 10*3/uL (ref 0.0–0.1)
Immature Granulocytes: 1 %
Lymphocytes Absolute: 1.5 10*3/uL (ref 0.7–3.1)
Lymphs: 17 %
MCH: 30.7 pg (ref 26.6–33.0)
MCHC: 34 g/dL (ref 31.5–35.7)
MCV: 90 fL (ref 79–97)
Monocytes Absolute: 0.6 10*3/uL (ref 0.1–0.9)
Monocytes: 7 %
Neutrophils Absolute: 6.2 10*3/uL (ref 1.4–7.0)
Neutrophils: 74 %
Platelets: 193 10*3/uL (ref 150–450)
RBC: 4.01 x10E6/uL (ref 3.77–5.28)
RDW: 12.5 % (ref 11.7–15.4)
RPR Ser Ql: NONREACTIVE
WBC: 8.4 10*3/uL (ref 3.4–10.8)

## 2019-10-09 ENCOUNTER — Other Ambulatory Visit: Payer: Self-pay

## 2019-10-09 DIAGNOSIS — O30033 Twin pregnancy, monochorionic/diamniotic, third trimester: Secondary | ICD-10-CM

## 2019-10-13 ENCOUNTER — Other Ambulatory Visit: Payer: Self-pay

## 2019-10-13 ENCOUNTER — Ambulatory Visit
Admission: RE | Admit: 2019-10-13 | Discharge: 2019-10-13 | Disposition: A | Discharge status: Home / Self Care | Payer: Medicaid Other | Source: Ambulatory Visit | Attending: Obstetrics and Gynecology | Admitting: Obstetrics and Gynecology

## 2019-10-13 DIAGNOSIS — Z3A27 27 weeks gestation of pregnancy: Secondary | ICD-10-CM | POA: Insufficient documentation

## 2019-10-13 DIAGNOSIS — O30039 Twin pregnancy, monochorionic/diamniotic, unspecified trimester: Secondary | ICD-10-CM | POA: Diagnosis present

## 2019-10-13 DIAGNOSIS — O30033 Twin pregnancy, monochorionic/diamniotic, third trimester: Secondary | ICD-10-CM | POA: Diagnosis not present

## 2019-10-13 NOTE — Progress Notes (Signed)
See ultrasound reports appropriately grown at 27 w3d  F/u 2 weeks TTTS check  4 weeks growth  Then weekly NST AFI or BPP until delivery around 37 weeks   Gatha Mayer

## 2019-10-17 ENCOUNTER — Ambulatory Visit (INDEPENDENT_AMBULATORY_CARE_PROVIDER_SITE_OTHER): Payer: Medicaid Other

## 2019-10-17 ENCOUNTER — Telehealth: Payer: Self-pay

## 2019-10-17 ENCOUNTER — Other Ambulatory Visit: Payer: Self-pay

## 2019-10-17 DIAGNOSIS — R300 Dysuria: Secondary | ICD-10-CM | POA: Diagnosis not present

## 2019-10-17 LAB — POCT URINALYSIS DIPSTICK
Bilirubin, UA: NEGATIVE
Blood, UA: NEGATIVE
Glucose, UA: NEGATIVE
Ketones, UA: NEGATIVE
Leukocytes, UA: NEGATIVE
Nitrite, UA: NEGATIVE
Protein, UA: NEGATIVE
Spec Grav, UA: 1.02 (ref 1.010–1.025)
Urobilinogen, UA: NEGATIVE E.U./dL — AB
pH, UA: 6.5 (ref 5.0–8.0)

## 2019-10-17 NOTE — Telephone Encounter (Signed)
Pt aware that urine looked normal, but I am sending for a culture. Once results come back I will let her know.

## 2019-10-19 LAB — URINE CULTURE

## 2019-10-21 ENCOUNTER — Ambulatory Visit (INDEPENDENT_AMBULATORY_CARE_PROVIDER_SITE_OTHER): Payer: Medicaid Other | Admitting: Maternal Newborn

## 2019-10-21 ENCOUNTER — Encounter: Payer: Self-pay | Admitting: Maternal Newborn

## 2019-10-21 ENCOUNTER — Other Ambulatory Visit: Payer: Self-pay

## 2019-10-21 VITALS — BP 104/68 | Wt 153.0 lb

## 2019-10-21 DIAGNOSIS — O30032 Twin pregnancy, monochorionic/diamniotic, second trimester: Secondary | ICD-10-CM

## 2019-10-21 DIAGNOSIS — O0993 Supervision of high risk pregnancy, unspecified, third trimester: Secondary | ICD-10-CM

## 2019-10-21 DIAGNOSIS — Z23 Encounter for immunization: Secondary | ICD-10-CM

## 2019-10-21 DIAGNOSIS — Z3A28 28 weeks gestation of pregnancy: Secondary | ICD-10-CM

## 2019-10-21 DIAGNOSIS — O30033 Twin pregnancy, monochorionic/diamniotic, third trimester: Secondary | ICD-10-CM

## 2019-10-21 DIAGNOSIS — O0990 Supervision of high risk pregnancy, unspecified, unspecified trimester: Secondary | ICD-10-CM

## 2019-10-21 NOTE — Progress Notes (Signed)
ROB Tdap/BT consent today  C/o some braxton hicks  Denies lof, no vb Good FM in both

## 2019-10-21 NOTE — Patient Instructions (Signed)
Third Trimester of Pregnancy The third trimester is from week 28 through week 40 (months 7 through 9). The third trimester is a time when the unborn baby (fetus) is growing rapidly. At the end of the ninth month, the fetus is about 20 inches in length and weighs 6-10 pounds. Body changes during your third trimester Your body will continue to go through many changes during pregnancy. The changes vary from woman to woman. During the third trimester:  Your weight will continue to increase. You can expect to gain 25-35 pounds (11-16 kg) by the end of the pregnancy.  You may begin to get stretch marks on your hips, abdomen, and breasts.  You may urinate more often because the fetus is moving lower into your pelvis and pressing on your bladder.  You may develop or continue to have heartburn. This is caused by increased hormones that slow down muscles in the digestive tract.  You may develop or continue to have constipation because increased hormones slow digestion and cause the muscles that push waste through your intestines to relax.  You may develop hemorrhoids. These are swollen veins (varicose veins) in the rectum that can itch or be painful.  You may develop swollen, bulging veins (varicose veins) in your legs.  You may have increased body aches in the pelvis, back, or thighs. This is due to weight gain and increased hormones that are relaxing your joints.  You may have changes in your hair. These can include thickening of your hair, rapid growth, and changes in texture. Some women also have hair loss during or after pregnancy, or hair that feels dry or thin. Your hair will most likely return to normal after your baby is born.  Your breasts will continue to grow and they will continue to become tender. A yellow fluid (colostrum) may leak from your breasts. This is the first milk you are producing for your baby.  Your belly button may stick out.  You may notice more swelling in your hands,  face, or ankles.  You may have increased tingling or numbness in your hands, arms, and legs. The skin on your belly may also feel numb.  You may feel short of breath because of your expanding uterus.  You may have more problems sleeping. This can be caused by the size of your belly, increased need to urinate, and an increase in your body's metabolism.  You may notice the fetus "dropping," or moving lower in your abdomen (lightening).  You may have increased vaginal discharge.  You may notice your joints feel loose and you may have pain around your pelvic bone. What to expect at prenatal visits You will have prenatal exams every 2 weeks until week 36. Then you will have weekly prenatal exams. During a routine prenatal visit:  You will be weighed to make sure you and the baby are growing normally.  Your blood pressure will be taken.  Your abdomen will be measured to track your baby's growth.  The fetal heartbeat will be listened to.  Any test results from the previous visit will be discussed.  You may have a cervical check near your due date to see if your cervix has softened or thinned (effaced).  You will be tested for Group B streptococcus. This happens between 35 and 37 weeks. Your health care provider may ask you:  What your birth plan is.  How you are feeling.  If you are feeling the baby move.  If you have had any abnormal   symptoms, such as leaking fluid, bleeding, severe headaches, or abdominal cramping.  If you are using any tobacco products, including cigarettes, chewing tobacco, and electronic cigarettes.  If you have any questions. Other tests or screenings that may be performed during your third trimester include:  Blood tests that check for low iron levels (anemia).  Fetal testing to check the health, activity level, and growth of the fetus. Testing is done if you have certain medical conditions or if there are problems during the pregnancy.  Nonstress test  (NST). This test checks the health of your baby to make sure there are no signs of problems, such as the baby not getting enough oxygen. During this test, a belt is placed around your belly. The baby is made to move, and its heart rate is monitored during movement. What is false labor? False labor is a condition in which you feel small, irregular tightenings of the muscles in the womb (contractions) that usually go away with rest, changing position, or drinking water. These are called Braxton Hicks contractions. Contractions may last for hours, days, or even weeks before true labor sets in. If contractions come at regular intervals, become more frequent, increase in intensity, or become painful, you should see your health care provider. What are the signs of labor?  Abdominal cramps.  Regular contractions that start at 10 minutes apart and become stronger and more frequent with time.  Contractions that start on the top of the uterus and spread down to the lower abdomen and back.  Increased pelvic pressure and dull back pain.  A watery or bloody mucus discharge that comes from the vagina.  Leaking of amniotic fluid. This is also known as your "water breaking." It could be a slow trickle or a gush. Let your health care provider know if it has a color or strange odor. If you have any of these signs, call your health care provider right away, even if it is before your due date. Follow these instructions at home: Medicines  Follow your health care provider's instructions regarding medicine use. Specific medicines may be either safe or unsafe to take during pregnancy.  Take a prenatal vitamin that contains at least 600 micrograms (mcg) of folic acid.  If you develop constipation, try taking a stool softener if your health care provider approves. Eating and drinking   Eat a balanced diet that includes fresh fruits and vegetables, whole grains, good sources of protein such as meat, eggs, or tofu,  and low-fat dairy. Your health care provider will help you determine the amount of weight gain that is right for you.  Avoid raw meat and uncooked cheese. These carry germs that can cause birth defects in the baby.  If you have low calcium intake from food, talk to your health care provider about whether you should take a daily calcium supplement.  Eat four or five small meals rather than three large meals a day.  Limit foods that are high in fat and processed sugars, such as fried and sweet foods.  To prevent constipation: ? Drink enough fluid to keep your urine clear or pale yellow. ? Eat foods that are high in fiber, such as fresh fruits and vegetables, whole grains, and beans. Activity  Exercise only as directed by your health care provider. Most women can continue their usual exercise routine during pregnancy. Try to exercise for 30 minutes at least 5 days a week. Stop exercising if you experience uterine contractions.  Avoid heavy lifting.  Do   not exercise in extreme heat or humidity, or at high altitudes.  Wear low-heel, comfortable shoes.  Practice good posture.  You may continue to have sex unless your health care provider tells you otherwise. Relieving pain and discomfort  Take frequent breaks and rest with your legs elevated if you have leg cramps or low back pain.  Take warm sitz baths to soothe any pain or discomfort caused by hemorrhoids. Use hemorrhoid cream if your health care provider approves.  Wear a good support bra to prevent discomfort from breast tenderness.  If you develop varicose veins: ? Wear support pantyhose or compression stockings as told by your healthcare provider. ? Elevate your feet for 15 minutes, 3-4 times a day. Prenatal care  Write down your questions. Take them to your prenatal visits.  Keep all your prenatal visits as told by your health care provider. This is important. Safety  Wear your seat belt at all times when driving.  Make  a list of emergency phone numbers, including numbers for family, friends, the hospital, and police and fire departments. General instructions  Avoid cat litter boxes and soil used by cats. These carry germs that can cause birth defects in the baby. If you have a cat, ask someone to clean the litter box for you.  Do not travel far distances unless it is absolutely necessary and only with the approval of your health care provider.  Do not use hot tubs, steam rooms, or saunas.  Do not drink alcohol.  Do not use any products that contain nicotine or tobacco, such as cigarettes and e-cigarettes. If you need help quitting, ask your health care provider.  Do not use any medicinal herbs or unprescribed drugs. These chemicals affect the formation and growth of the baby.  Do not douche or use tampons or scented sanitary pads.  Do not cross your legs for long periods of time.  To prepare for the arrival of your baby: ? Take prenatal classes to understand, practice, and ask questions about labor and delivery. ? Make a trial run to the hospital. ? Visit the hospital and tour the maternity area. ? Arrange for maternity or paternity leave through employers. ? Arrange for family and friends to take care of pets while you are in the hospital. ? Purchase a rear-facing car seat and make sure you know how to install it in your car. ? Pack your hospital bag. ? Prepare the baby's nursery. Make sure to remove all pillows and stuffed animals from the baby's crib to prevent suffocation.  Visit your dentist if you have not gone during your pregnancy. Use a soft toothbrush to brush your teeth and be gentle when you floss. Contact a health care provider if:  You are unsure if you are in labor or if your water has broken.  You become dizzy.  You have mild pelvic cramps, pelvic pressure, or nagging pain in your abdominal area.  You have lower back pain.  You have persistent nausea, vomiting, or diarrhea.   You have an unusual or bad smelling vaginal discharge.  You have pain when you urinate. Get help right away if:  Your water breaks before 37 weeks.  You have regular contractions less than 5 minutes apart before 37 weeks.  You have a fever.  You are leaking fluid from your vagina.  You have spotting or bleeding from your vagina.  You have severe abdominal pain or cramping.  You have rapid weight loss or weight gain.  You have  shortness of breath with chest pain.  You notice sudden or extreme swelling of your face, hands, ankles, feet, or legs.  Your baby makes fewer than 10 movements in 2 hours.  You have severe headaches that do not go away when you take medicine.  You have vision changes. Summary  The third trimester is from week 28 through week 40, months 7 through 9. The third trimester is a time when the unborn baby (fetus) is growing rapidly.  During the third trimester, your discomfort may increase as you and your baby continue to gain weight. You may have abdominal, leg, and back pain, sleeping problems, and an increased need to urinate.  During the third trimester your breasts will keep growing and they will continue to become tender. A yellow fluid (colostrum) may leak from your breasts. This is the first milk you are producing for your baby.  False labor is a condition in which you feel small, irregular tightenings of the muscles in the womb (contractions) that eventually go away. These are called Braxton Hicks contractions. Contractions may last for hours, days, or even weeks before true labor sets in.  Signs of labor can include: abdominal cramps; regular contractions that start at 10 minutes apart and become stronger and more frequent with time; watery or bloody mucus discharge that comes from the vagina; increased pelvic pressure and dull back pain; and leaking of amniotic fluid. This information is not intended to replace advice given to you by your health  care provider. Make sure you discuss any questions you have with your health care provider. Document Released: 11/28/2001 Document Revised: 03/27/2019 Document Reviewed: 01/09/2017 Elsevier Patient Education  2020 [employer]  
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
## 2019-10-23 ENCOUNTER — Other Ambulatory Visit: Payer: Self-pay

## 2019-10-23 DIAGNOSIS — O30033 Twin pregnancy, monochorionic/diamniotic, third trimester: Secondary | ICD-10-CM

## 2019-10-27 ENCOUNTER — Ambulatory Visit
Admission: RE | Admit: 2019-10-27 | Discharge: 2019-10-27 | Disposition: A | Discharge status: Home / Self Care | Payer: Medicaid Other | Source: Ambulatory Visit | Attending: Maternal & Fetal Medicine | Admitting: Maternal & Fetal Medicine

## 2019-10-27 ENCOUNTER — Other Ambulatory Visit: Payer: Self-pay

## 2019-10-27 DIAGNOSIS — O30033 Twin pregnancy, monochorionic/diamniotic, third trimester: Secondary | ICD-10-CM | POA: Diagnosis not present

## 2019-10-27 DIAGNOSIS — Z3A29 29 weeks gestation of pregnancy: Secondary | ICD-10-CM | POA: Insufficient documentation

## 2019-10-28 ENCOUNTER — Other Ambulatory Visit: Payer: Self-pay | Admitting: Obstetrics and Gynecology

## 2019-10-28 DIAGNOSIS — O30033 Twin pregnancy, monochorionic/diamniotic, third trimester: Secondary | ICD-10-CM

## 2019-11-04 ENCOUNTER — Ambulatory Visit (INDEPENDENT_AMBULATORY_CARE_PROVIDER_SITE_OTHER): Payer: Medicaid Other | Admitting: Obstetrics and Gynecology

## 2019-11-04 ENCOUNTER — Other Ambulatory Visit: Payer: Self-pay

## 2019-11-04 VITALS — BP 118/58 | Wt 159.0 lb

## 2019-11-04 DIAGNOSIS — Z3A30 30 weeks gestation of pregnancy: Secondary | ICD-10-CM

## 2019-11-04 DIAGNOSIS — O30032 Twin pregnancy, monochorionic/diamniotic, second trimester: Secondary | ICD-10-CM

## 2019-11-04 DIAGNOSIS — O30033 Twin pregnancy, monochorionic/diamniotic, third trimester: Secondary | ICD-10-CM

## 2019-11-04 DIAGNOSIS — O0990 Supervision of high risk pregnancy, unspecified, unspecified trimester: Secondary | ICD-10-CM

## 2019-11-04 LAB — POCT URINALYSIS DIPSTICK OB
Glucose, UA: NEGATIVE
POC,PROTEIN,UA: NEGATIVE

## 2019-11-04 NOTE — Progress Notes (Signed)
ROB

## 2019-11-04 NOTE — Telephone Encounter (Signed)
Pt has been seen since
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
## 2019-11-10 ENCOUNTER — Other Ambulatory Visit: Payer: Self-pay

## 2019-11-10 ENCOUNTER — Ambulatory Visit
Admission: RE | Admit: 2019-11-10 | Discharge: 2019-11-10 | Disposition: A | Discharge status: Home / Self Care | Payer: Medicaid Other | Source: Ambulatory Visit | Attending: Obstetrics and Gynecology | Admitting: Obstetrics and Gynecology

## 2019-11-10 VITALS — BP 122/78 | HR 100 | Temp 98.8°F | Wt 159.0 lb

## 2019-11-10 DIAGNOSIS — Z3A31 31 weeks gestation of pregnancy: Secondary | ICD-10-CM | POA: Diagnosis not present

## 2019-11-10 DIAGNOSIS — O0990 Supervision of high risk pregnancy, unspecified, unspecified trimester: Secondary | ICD-10-CM

## 2019-11-10 DIAGNOSIS — O30033 Twin pregnancy, monochorionic/diamniotic, third trimester: Secondary | ICD-10-CM | POA: Diagnosis not present

## 2019-11-10 DIAGNOSIS — O30039 Twin pregnancy, monochorionic/diamniotic, unspecified trimester: Secondary | ICD-10-CM | POA: Diagnosis present

## 2019-11-10 DIAGNOSIS — O365990 Maternal care for other known or suspected poor fetal growth, unspecified trimester, not applicable or unspecified: Secondary | ICD-10-CM

## 2019-11-10 DIAGNOSIS — IMO0002 Reserved for concepts with insufficient information to code with codable children: Secondary | ICD-10-CM | POA: Diagnosis present

## 2019-11-10 NOTE — Progress Notes (Signed)
See u/s note in [facility]  Twin B with lagging [facility]  Started twice weekly testing with Weekly dopplers .  Growth in 3 weeks   Gatha Mayer

## 2019-11-11 ENCOUNTER — Other Ambulatory Visit: Payer: Self-pay | Admitting: Obstetrics & Gynecology

## 2019-11-11 ENCOUNTER — Encounter: Payer: Medicaid Other | Admitting: Obstetrics and Gynecology

## 2019-11-11 NOTE — Progress Notes (Signed)
Discussed w pt recent results of Growth [place] at [place] twice weekly NST and weekly BPP, continued growth scans every 3 weeks, Dopplers. Pt has 11/30 appt for BPP/Dopplers at [facility] Will schedule for NST of twins this Friday at L&D (due to holiday and office closure). All questions answered, reassured  Barnett Applebaum, MD, [facility], [facility] 11/11/2019  1:48 PM

## 2019-11-11 NOTE — Telephone Encounter (Signed)
I called and d/w pt.  She will have NST on L&D Friday.

## 2019-11-14 ENCOUNTER — Observation Stay
Admission: RE | Admit: 2019-11-14 | Discharge: 2019-11-14 | Disposition: A | Discharge status: Home / Self Care | Payer: Medicaid Other | Attending: Obstetrics and Gynecology | Admitting: Obstetrics and Gynecology

## 2019-11-14 ENCOUNTER — Other Ambulatory Visit: Payer: Self-pay

## 2019-11-14 ENCOUNTER — Encounter: Payer: Self-pay | Admitting: *Deleted

## 2019-11-14 DIAGNOSIS — O30033 Twin pregnancy, monochorionic/diamniotic, third trimester: Secondary | ICD-10-CM | POA: Diagnosis not present

## 2019-11-14 DIAGNOSIS — O365932 Maternal care for other known or suspected poor fetal growth, third trimester, fetus 2: Secondary | ICD-10-CM | POA: Diagnosis not present

## 2019-11-14 DIAGNOSIS — O30039 Twin pregnancy, monochorionic/diamniotic, unspecified trimester: Secondary | ICD-10-CM | POA: Diagnosis present

## 2019-11-14 DIAGNOSIS — O365920 Maternal care for other known or suspected poor fetal growth, second trimester, not applicable or unspecified: Secondary | ICD-10-CM | POA: Diagnosis not present

## 2019-11-14 DIAGNOSIS — O0990 Supervision of high risk pregnancy, unspecified, unspecified trimester: Secondary | ICD-10-CM

## 2019-11-14 DIAGNOSIS — O30032 Twin pregnancy, monochorionic/diamniotic, second trimester: Secondary | ICD-10-CM | POA: Insufficient documentation

## 2019-11-14 DIAGNOSIS — Z3A31 31 weeks gestation of pregnancy: Secondary | ICD-10-CM | POA: Diagnosis not present

## 2019-11-14 DIAGNOSIS — IMO0002 Reserved for concepts with insufficient information to code with codable children: Secondary | ICD-10-CM | POA: Diagnosis present

## 2019-11-14 NOTE — Discharge Summary (Signed)
See final progress note. 

## 2019-11-14 NOTE — Final Progress Note (Signed)
Subjective: 23 y.o. G53P0000 female presents for NST for mono-di twins due to growth restriction of twin B (AC 9th%ile).  She notes +FM x 2, denies LOF and vaginal bleeding. Notes occasional ctx.   Objective: BP 121/73 (BP Location: Left Arm)   Pulse (!) 136   Temp 98.7 F (37.1 C) (Oral)   Resp 20   Ht 5\' 4"  (1.626 m)   Wt 72.1 kg   LMP 04/04/2019 (Exact Date)   BMI 27.29 kg/m   Physical Exam Constitutional:      General: She is not in acute distress.    Appearance: Normal appearance.  HENT:     Head: Normocephalic and atraumatic.  Eyes:     General: No scleral icterus.    Conjunctiva/sclera: Conjunctivae normal.  Neurological:     General: No focal deficit present.     Mental Status: She is alert and oriented to person, place, and time.     Cranial Nerves: No cranial nerve deficit.  Psychiatric:        Mood and Affect: Mood normal.        Behavior: Behavior normal.        Judgment: Judgment normal.    NST Baseline FHR A: 135 beats/min Variability: moderate Accelerations: present Decelerations: absent  Baseline FHR B: 140 beats/min Variability: moderate Accelerations: present Decelerations: absent  Tocometry: rare  Interpretation:  INDICATIONS: multiple gestation and fetal growth restriction of fetus B  RESULTS:  A NST procedure was performed with FHR monitoring and a normal baseline established, appropriate time of 20-40 minutes of evaluation, and accels >2 seen w 15x15 characteristics.  Results show a REACTIVE NST.    A/P: 23 y.o. G50P0000 female with mono-di twins here for NST Reactive NST x 2 Follow up 11/30 for BPP/AFI with [facility] Follow up 12/2 for ROB Follow up 12/3 at 0900 for NST on L&D (0900), scheduled today.  Prentice Docker, MD, [facility], [facility] 11/14/2019 10:54 AM

## 2019-11-14 NOTE — OB Triage Note (Signed)
NST for IUGR, mono/di twins. Amanda [name]

## 2019-11-17 ENCOUNTER — Ambulatory Visit
Admission: RE | Admit: 2019-11-17 | Discharge: 2019-11-17 | Disposition: A | Discharge status: Home / Self Care | Payer: Medicaid Other | Source: Ambulatory Visit

## 2019-11-17 ENCOUNTER — Other Ambulatory Visit: Payer: Self-pay | Admitting: Maternal & Fetal Medicine

## 2019-11-17 ENCOUNTER — Other Ambulatory Visit: Payer: Self-pay

## 2019-11-17 ENCOUNTER — Ambulatory Visit
Admission: RE | Admit: 2019-11-17 | Discharge: 2019-11-17 | Disposition: A | Discharge status: Home / Self Care | Payer: Medicaid Other | Source: Ambulatory Visit | Attending: Maternal & Fetal Medicine | Admitting: Maternal & Fetal Medicine

## 2019-11-17 ENCOUNTER — Other Ambulatory Visit: Payer: Self-pay | Admitting: Obstetrics and Gynecology

## 2019-11-17 DIAGNOSIS — O30033 Twin pregnancy, monochorionic/diamniotic, third trimester: Secondary | ICD-10-CM

## 2019-11-17 DIAGNOSIS — O30032 Twin pregnancy, monochorionic/diamniotic, second trimester: Secondary | ICD-10-CM

## 2019-11-17 DIAGNOSIS — Z3A32 32 weeks gestation of pregnancy: Secondary | ICD-10-CM | POA: Diagnosis not present

## 2019-11-17 DIAGNOSIS — O0990 Supervision of high risk pregnancy, unspecified, unspecified trimester: Secondary | ICD-10-CM

## 2019-11-19 ENCOUNTER — Ambulatory Visit (INDEPENDENT_AMBULATORY_CARE_PROVIDER_SITE_OTHER): Payer: Medicaid Other | Admitting: Obstetrics and Gynecology

## 2019-11-19 ENCOUNTER — Encounter: Payer: Self-pay | Admitting: Obstetrics and Gynecology

## 2019-11-19 ENCOUNTER — Other Ambulatory Visit: Payer: Self-pay

## 2019-11-19 VITALS — BP 127/77 | Wt 162.0 lb

## 2019-11-19 DIAGNOSIS — O30033 Twin pregnancy, monochorionic/diamniotic, third trimester: Secondary | ICD-10-CM

## 2019-11-19 DIAGNOSIS — O0993 Supervision of high risk pregnancy, unspecified, third trimester: Secondary | ICD-10-CM

## 2019-11-19 DIAGNOSIS — Z3A32 32 weeks gestation of pregnancy: Secondary | ICD-10-CM

## 2019-11-19 DIAGNOSIS — IMO0002 Reserved for concepts with insufficient information to code with codable children: Secondary | ICD-10-CM

## 2019-11-19 DIAGNOSIS — O0990 Supervision of high risk pregnancy, unspecified, unspecified trimester: Secondary | ICD-10-CM
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
## 2019-11-20 ENCOUNTER — Observation Stay
Admission: RE | Admit: 2019-11-20 | Discharge: 2019-11-20 | Disposition: A | Discharge status: Home / Self Care | Payer: Medicaid Other | Attending: Obstetrics & Gynecology | Admitting: Obstetrics & Gynecology

## 2019-11-20 ENCOUNTER — Other Ambulatory Visit: Payer: Self-pay

## 2019-11-20 ENCOUNTER — Telehealth: Payer: Self-pay | Admitting: Obstetrics and Gynecology

## 2019-11-20 DIAGNOSIS — Z880 Allergy status to penicillin: Secondary | ICD-10-CM | POA: Diagnosis not present

## 2019-11-20 DIAGNOSIS — O30033 Twin pregnancy, monochorionic/diamniotic, third trimester: Secondary | ICD-10-CM

## 2019-11-20 DIAGNOSIS — Z3A32 32 weeks gestation of pregnancy: Secondary | ICD-10-CM | POA: Insufficient documentation

## 2019-11-20 DIAGNOSIS — Z888 Allergy status to other drugs, medicaments and biological substances status: Secondary | ICD-10-CM | POA: Insufficient documentation

## 2019-11-20 NOTE — Telephone Encounter (Signed)
Called and left voice mail for patient to call back to be schedule °

## 2019-11-20 NOTE — Telephone Encounter (Signed)
-----   Message from Will Bonnet, MD sent at 11/19/2019 10:15 AM EST ----- Regarding: Schedule surgery Surgery Booking Request Patient Full Name:  Amanda [name]  MRN: [id number]  DOB: 07-23-1996  Surgeon: Prentice Docker, MD  Requested Surgery Date and Time: 12/05/2019 : 12:30 PM Primary Diagnosis AND Code:  1) Fetal growth restriction (P05.9) 2) monochorionic-diamniotic twin gestation (O30.033) 3) supervision high risk pregnancy (O09.90)  Secondary Diagnosis and Code:  Surgical Procedure: Cesarean Section L&D Notification: Yes Admission Status: surgery admit Length of Surgery: 60 min Special Case Needs: No H&P: Yes Phone Interview???:  Yes Interpreter: No Language:  Medical Clearance:  No Special Scheduling Instructions: None (patient to arrive at L&D at 0730 on 12/18) Any known health/anesthesia issues, diabetes, sleep apnea, latex allergy, defibrillator/pacemaker?: No Acuity: P2   (P1 highest, P2 delay may cause harm, P3 low, elective gyn, P4 lowest)

## 2019-11-20 NOTE — Telephone Encounter (Signed)
Patient scheduled.

## 2019-11-20 NOTE — Telephone Encounter (Signed)
Patient is aware of H&P at [facility] on 12/01/19 @ 8:50am w/ Dr. Glennon Mac, Pre-admit testing to be scheduled, COVID testing on 12/16, and OR on 12/05/19. Patient is aware to quarantine after COVID testing.

## 2019-11-20 NOTE — OB Triage Note (Addendum)
NST Twin Girls

## 2019-11-20 NOTE — Discharge Summary (Signed)
Physician Final Progress Note  Patient ID: Amanda [name] MRN: [id number] DOB/AGE: 21/04/1996 21 y.o.  Admit date: 11/20/2019 Admitting provider: Gae Dry, MD Discharge date: 11/20/2019   Admission Diagnoses: Monochorionic Diamniotic twin gestation with growth restriction  Discharge Diagnoses:  Active Problems:   Monochorionic diamniotic twin gestation in third trimester IUP at 32 weeks Reactive NST  History of Present Illness: The patient is a 23 y.o. female G1P0000 at [redacted]w[redacted]d who presents for NST of twin gestation.   Past Medical History:  Diagnosis Date  . Migraine     Past Surgical History:  Procedure Laterality Date  . NO PAST SURGERIES    . WISDOM TOOTH EXTRACTION      No current facility-administered medications on file prior to encounter.    Current Outpatient Medications on File Prior to Encounter  Medication Sig Dispense Refill  . acetaminophen (TYLENOL) 500 MG tablet Take 1,000 mg by mouth every 6 (six) hours as needed for headache.    . doxylamine, Sleep, (UNISOM) 25 MG tablet Take 25 mg by mouth at bedtime as needed. Nausea    . Prenatal MV & Min w/FA-DHA (ONE A DAY PRENATAL) 0.4-25 MG CHEW Chew by mouth.      Allergies  Allergen Reactions  . Imitrex [Sumatriptan] Swelling    Facial swelling and burning lips  . Penicillins Hives    Social History   Socioeconomic History  . Marital status: Significant Other    Spouse name: [name]  . Number of children: Not on file  . Years of education: Not on file  . Highest education level: Not on file  Occupational History  . Occupation: Massage Therapy  Social Needs  . Financial resource strain: Not on file  . Food insecurity    Worry: Not on file    Inability: Not on file  . Transportation needs    Medical: Not on file    Non-medical: Not on file  Tobacco Use  . Smoking status: Never Smoker  . Smokeless tobacco: Never Used  Substance and Sexual Activity  . Alcohol use: Never    Frequency:  Never  . Drug use: No  . Sexual activity: Yes    Birth control/protection: Pill  Lifestyle  . Physical activity    Days per week: Not on file    Minutes per session: Not on file  . Stress: Not on file  Relationships  . Social Herbalist on phone: Not on file    Gets together: Not on file    Attends religious service: Not on file    Active member of club or organization: Not on file    Attends meetings of clubs or organizations: Not on file    Relationship status: Not on file  . Intimate partner violence    Fear of current or ex partner: Not on file    Emotionally abused: Not on file    Physically abused: Not on file    Forced sexual activity: Not on file  Other Topics Concern  . Not on file  Social History Narrative      Right-handed   Caffeine: occasional coffee    Family History  Problem Relation Age of Onset  . Migraines Mother   . Skin cancer Father   . Asthma Maternal Grandmother   . Hypertension Maternal Grandmother   . Thyroid disease Maternal Grandmother   . Hypertension Maternal Grandfather   . Pancreatic cancer Maternal Grandfather 47  . Hypertension Paternal Grandmother   .  Diabetes Paternal Grandmother   . Hypertension Paternal Grandfather   . Diabetes Paternal Grandfather   . Thyroid disease Maternal Aunt   . Breast cancer Maternal Aunt 45     Review of Systems  Constitutional: Negative.   HENT: Negative.   Eyes: Negative.   Respiratory: Negative.   Cardiovascular: Negative.   Gastrointestinal: Negative.   Genitourinary: Negative.   Musculoskeletal: Negative.   Skin: Negative.   Neurological: Negative.   Endo/Heme/Allergies: Negative.   Psychiatric/Behavioral: Negative.      Physical Exam: BP 117/75 (BP Location: Right Arm)   Pulse 80   Temp 98.2 F (36.8 C) (Oral)   Resp 12   LMP 04/04/2019 (Exact Date)   Constitutional: Well nourished, well developed female in no acute distress.  HEENT: normal Skin: Warm and dry.   Cardiovascular: Regular rate and rhythm.   Extremity: no edema  Respiratory: Clear to auscultation bilateral. Normal respiratory effort Abdomen: FHT present Back: no CVAT Neuro: DTRs 2+, Cranial nerves grossly intact Psych: Alert and Oriented x3. No memory deficits. Normal mood and affect.  MS: normal gait, normal bilateral lower extremity ROM/strength/stability.  Toco: uterine irritability with occasional contraction Fetal well being: Twin A: 145 bpm baseline, moderate variability, + 10x10 accelerations, -decelerations Twin B: 145 bpm baseline, moderate variability, + 15x15 accelerations, -decelerations  Cervix not checked   Consults: None  Significant Findings/ Diagnostic Studies: none  Procedures: NST  Hospital Course: The patient was admitted to Labor and Delivery Triage for NST.  Discharge Condition: good  Disposition: Discharge disposition: 01-Home or Self Care  Diet: Regular diet  Discharge Activity: Activity as tolerated  Discharge Instructions    Discharge activity:  No Restrictions   Complete by: As directed    Discharge diet:  No restrictions   Complete by: As directed    Fetal Kick Count:  Lie on our left side for one hour after a meal, and count the number of times your baby kicks.  If it is less than 5 times, get up, move around and drink some juice.  Repeat the test 30 minutes later.  If it is still less than 5 kicks in an hour, notify your doctor.   Complete by: As directed    No sexual activity restrictions   Complete by: As directed    Notify physician for a general feeling that "something is not right"   Complete by: As directed    Notify physician for increase or change in vaginal discharge   Complete by: As directed    Notify physician for intestinal cramps, with or without diarrhea, sometimes described as "gas pain"   Complete by: As directed    Notify physician for leaking of fluid   Complete by: As directed    Notify physician for low, dull  backache, unrelieved by heat or Tylenol   Complete by: As directed    Notify physician for menstrual like cramps   Complete by: As directed    Notify physician for pelvic pressure   Complete by: As directed    Notify physician for uterine contractions.  These may be painless and feel like the uterus is tightening or the baby is  "balling up"   Complete by: As directed    Notify physician for vaginal bleeding   Complete by: As directed    PRETERM LABOR:  Includes any of the follwing symptoms that occur between 20 - [redacted] weeks gestation.  If these symptoms are not stopped, preterm labor can result in preterm delivery,  placing your baby at risk   Complete by: As directed      Allergies as of 11/20/2019      Reactions   Imitrex [sumatriptan] Swelling   Facial swelling and burning lips   Penicillins Hives      Medication List    TAKE these medications   acetaminophen 500 MG tablet Commonly known as: TYLENOL Take 1,000 mg by mouth every 6 (six) hours as needed for headache.   doxylamine (Sleep) 25 MG tablet Commonly known as: UNISOM Take 25 mg by mouth at bedtime as needed. Nausea   One A Day Prenatal 0.4-25 MG Chew Chew by mouth.      [place]. Go to.   Specialty: Obstetrics and Gynecology Why: regular scheduled appointment Contact information: [address] [place] [id number] [phone]          Total time spent taking care of this patient: 15 minutes  Signed: Rod Can, CNM  11/20/2019, 10:04 AM

## 2019-11-20 NOTE — Telephone Encounter (Signed)
-----   Message from Will Bonnet, MD sent at 11/19/2019 10:13 AM EST ----- Regarding: Schedule appt next week with me Please call this patient to schedule an appointment with me next week for GBS swab.  It can be any day I'm in clinic and time that works for her.  You can double or over book for this as she is just having a swab performed and leaving (so a very short appointment). She is aware you'll be calling. Thank you! Prentice Docker, MD

## 2019-11-21 ENCOUNTER — Encounter: Payer: Self-pay | Admitting: Obstetrics and Gynecology

## 2019-11-21 ENCOUNTER — Ambulatory Visit (INDEPENDENT_AMBULATORY_CARE_PROVIDER_SITE_OTHER): Payer: Medicaid Other | Admitting: Obstetrics and Gynecology

## 2019-11-21 ENCOUNTER — Other Ambulatory Visit (HOSPITAL_COMMUNITY)
Admission: RE | Admit: 2019-11-21 | Discharge: 2019-11-21 | Disposition: A | Discharge status: Home / Self Care | Payer: Medicaid Other | Source: Ambulatory Visit | Attending: Obstetrics and Gynecology | Admitting: Obstetrics and Gynecology

## 2019-11-21 ENCOUNTER — Other Ambulatory Visit: Payer: Self-pay

## 2019-11-21 VITALS — BP 122/84 | Wt 161.0 lb

## 2019-11-21 DIAGNOSIS — Z3A33 33 weeks gestation of pregnancy: Secondary | ICD-10-CM | POA: Insufficient documentation

## 2019-11-21 DIAGNOSIS — O30033 Twin pregnancy, monochorionic/diamniotic, third trimester: Secondary | ICD-10-CM

## 2019-11-21 DIAGNOSIS — O365930 Maternal care for other known or suspected poor fetal growth, third trimester, not applicable or unspecified: Secondary | ICD-10-CM

## 2019-11-21 DIAGNOSIS — O0990 Supervision of high risk pregnancy, unspecified, unspecified trimester: Secondary | ICD-10-CM

## 2019-11-21 DIAGNOSIS — IMO0002 Reserved for concepts with insufficient information to code with codable children: Secondary | ICD-10-CM

## 2019-11-21 DIAGNOSIS — O0993 Supervision of high risk pregnancy, unspecified, third trimester: Secondary | ICD-10-CM
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
## 2019-11-24 ENCOUNTER — Other Ambulatory Visit: Payer: Self-pay

## 2019-11-24 ENCOUNTER — Ambulatory Visit
Admission: RE | Admit: 2019-11-24 | Discharge: 2019-11-24 | Disposition: A | Discharge status: Home / Self Care | Payer: Medicaid Other | Source: Ambulatory Visit | Attending: Maternal & Fetal Medicine | Admitting: Maternal & Fetal Medicine

## 2019-11-24 DIAGNOSIS — IMO0002 Reserved for concepts with insufficient information to code with codable children: Secondary | ICD-10-CM

## 2019-11-24 DIAGNOSIS — O30033 Twin pregnancy, monochorionic/diamniotic, third trimester: Secondary | ICD-10-CM

## 2019-11-24 DIAGNOSIS — O0990 Supervision of high risk pregnancy, unspecified, unspecified trimester: Secondary | ICD-10-CM

## 2019-11-24 DIAGNOSIS — Z3A33 33 weeks gestation of pregnancy: Secondary | ICD-10-CM | POA: Diagnosis not present

## 2019-11-25 LAB — STREP GP B SUSCEPTIBILITY

## 2019-11-25 LAB — CERVICOVAGINAL ANCILLARY ONLY
Chlamydia: NEGATIVE
Comment: NEGATIVE
Comment: NORMAL
Neisseria Gonorrhea: NEGATIVE

## 2019-11-25 LAB — STREP GP B CULTURE+RFLX: Strep Gp B Culture+Rflx: POSITIVE — AB

## 2019-11-27 ENCOUNTER — Encounter: Payer: Self-pay | Admitting: Anesthesiology

## 2019-11-27 ENCOUNTER — Other Ambulatory Visit: Payer: Self-pay

## 2019-11-27 ENCOUNTER — Encounter: Payer: Self-pay | Admitting: Obstetrics and Gynecology

## 2019-11-27 ENCOUNTER — Inpatient Hospital Stay
Admission: RE | Admit: 2019-11-27 | Discharge: 2019-11-27 | Disposition: A | Discharge status: Home / Self Care | Payer: Medicaid Other | Source: Ambulatory Visit | Attending: Obstetrics and Gynecology | Admitting: Obstetrics and Gynecology

## 2019-11-27 DIAGNOSIS — Z3A33 33 weeks gestation of pregnancy: Secondary | ICD-10-CM | POA: Insufficient documentation

## 2019-11-27 DIAGNOSIS — O30033 Twin pregnancy, monochorionic/diamniotic, third trimester: Secondary | ICD-10-CM | POA: Diagnosis not present

## 2019-11-27 DIAGNOSIS — O0990 Supervision of high risk pregnancy, unspecified, unspecified trimester: Secondary | ICD-10-CM

## 2019-11-27 DIAGNOSIS — O365932 Maternal care for other known or suspected poor fetal growth, third trimester, fetus 2: Secondary | ICD-10-CM | POA: Insufficient documentation

## 2019-11-27 DIAGNOSIS — IMO0002 Reserved for concepts with insufficient information to code with codable children: Secondary | ICD-10-CM

## 2019-11-27 DIAGNOSIS — Z3A34 34 weeks gestation of pregnancy: Secondary | ICD-10-CM | POA: Diagnosis not present

## 2019-11-27 NOTE — Procedures (Signed)
  23 year old G1 P0 with known monochorionic diamniotic twins at 33wk 6d presents for a NST. Babies are active. Last growth scan revealed mild growth restriction of Twin B (AC in the 9th%) Normal Dopplers and 8/8 BPPs x2 on 11/24/2019   Twin A: on maternal right: baseline FHR 130-140 with 15 x 15 accelerations to 160s to 170, moderate variability  Twin B: on maternal left, baseline 140 with 15x15 accelerations to 160s to 170, with moderate variability.   A: Twin gestation with reactive NSTs x 2  P: Continue weekly Dopplers and BPPs on 14 December at [facility]. Growth scan also scheduled for 14 December Return to L&D 16 and 17 December for betamethasone in preparation for possible delivery/IOL on 18 December (discussed with Dr Lehman Prom today). NST scheduled for 17 December.in L&D  Dalia Heading, CNM

## 2019-11-27 NOTE — Progress Notes (Signed)
Consulted with DR Lehman Prom MFM per [facility] CNM about betamethasone administration. MFM recommended BM administration next week Wednesday/Thursday for anticipated deliver that Friday at 35 weeks.

## 2019-11-27 NOTE — Discharge Summary (Signed)
Patient discharged home, discharge instructions given, patient states understanding. Patient left floor in stable condition, denies any other needs at this time. Patient to keep next scheduled appointments.

## 2019-11-27 NOTE — Discharge Instructions (Signed)
Call your provider for any other concerns.

## 2019-11-27 NOTE — OB Triage Note (Signed)
Scheduled NST for mono-di twins. Pt states babies are moving well. Denies any other complaints at this time.

## 2019-12-01 ENCOUNTER — Other Ambulatory Visit: Payer: Self-pay

## 2019-12-01 ENCOUNTER — Ambulatory Visit (INDEPENDENT_AMBULATORY_CARE_PROVIDER_SITE_OTHER): Payer: Medicaid Other | Admitting: Obstetrics and Gynecology

## 2019-12-01 ENCOUNTER — Encounter: Payer: Medicaid Other | Admitting: Obstetrics and Gynecology

## 2019-12-01 ENCOUNTER — Encounter: Payer: Self-pay | Admitting: Obstetrics and Gynecology

## 2019-12-01 ENCOUNTER — Ambulatory Visit
Admission: RE | Admit: 2019-12-01 | Discharge: 2019-12-01 | Disposition: A | Discharge status: Home / Self Care | Payer: Medicaid Other | Source: Ambulatory Visit | Attending: Maternal & Fetal Medicine | Admitting: Maternal & Fetal Medicine

## 2019-12-01 VITALS — BP 118/74 | Ht 64.0 in | Wt 166.0 lb

## 2019-12-01 DIAGNOSIS — O30033 Twin pregnancy, monochorionic/diamniotic, third trimester: Secondary | ICD-10-CM | POA: Insufficient documentation

## 2019-12-01 DIAGNOSIS — IMO0002 Reserved for concepts with insufficient information to code with codable children: Secondary | ICD-10-CM

## 2019-12-01 DIAGNOSIS — O0990 Supervision of high risk pregnancy, unspecified, unspecified trimester: Secondary | ICD-10-CM

## 2019-12-01 DIAGNOSIS — O0993 Supervision of high risk pregnancy, unspecified, third trimester: Secondary | ICD-10-CM

## 2019-12-01 DIAGNOSIS — Z3A34 34 weeks gestation of pregnancy: Secondary | ICD-10-CM | POA: Diagnosis not present

## 2019-12-01 NOTE — Progress Notes (Signed)
OB History & Physical   History of Present Illness:  Chief Complaint: Preoperative and pre-induction visit for twin gestation  HPI:  Amanda [name] is a 23 y.o. G1P0000 female at [redacted]w[redacted]d dated by L=6.  Her pregnancy has been complicated by monochorionic, diamniotic twin gestation, fetal growth restriction (twin B, mild).    She denies contractions.   She denies leakage of fluid.   She denies vaginal bleeding.   She reports fetal movement.    Total weight gain for pregnancy: 40 lb 8 oz (18.4 kg)   Obstetrical Problem List: Pregnancy #1 Problems (from 04/04/19 to present)    Problem Noted Resolved   Poor fetal growth 11/10/2019 by Gatha Mayer, MD No   Overview Signed 11/10/2019  2:29 PM by Gatha Mayer, MD    Twin B AC 9th %ile - Started twice weekly testing with weekly doppler AFI       Monochorionic diamniotic twin gestation 05/20/2019 by Homero Fellers, MD No   Overview Addendum 09/09/2019  4:05 PM by Will Bonnet, MD    Monochorionic/diamniotic at 7 week u/s [x]  referral to [facility] for co-management. [x]  ECHO  Monochorionic Diamniotic Twin Pregnancy Plan  Mom should take 60-120mg  of elemental iron a day as well as 1mg  of folic acid  0000000 wks: Evaluate for early growth discordance between  16 wks: Begin Ultrasounds for TTTS checks every 2 weeks (monitor MVP* for each twin and visualize the bladder) (done) 18-20 wks:  Anatomy Ultrasound 22 wks: Fetal Echo - ordered 20-Delivery: Growth Ultrasounds every 2-4 weeks** 32 wks: Initiate NSTs  Delivery No complications: 34 0/7 - 37 6/7 Isolated IUGR: 32 0/7- 34 6/7  Genetic screening offered with first trimester screen (11-14 wks) or CVS sampling (10-12 wks)  Risks: TTTS complicates up to 123XX123 of monochorionic pregnancies TAPS complicates [id number] of monochorionic pregnancies TRAP complicates 1% of monochorionic pregnancies A fetal demise is one twin results in a 40-50% risk of death of neurologic  handicap in the surviving twin There is a ninefold increased risk of congenital heart defects, 4-5%. Normal risk in 0.5%. Perinatal loss and handicap rate is 3-5 times higher than a dichorionic pregnancy  *MVP for either twin > 8cm or < 2cm should result in MFM referral Weekly [place] if issues with MVP arise  **IUGR or Growth discordance > 20% should prompt MFM consultation CALCULATOR: http://perinatology.com/calculators/Twin%20Discordance.htm       Supervision of high risk pregnancy, antepartum 05/20/2019 by Homero Fellers, MD No   Overview Addendum 10/22/2019 10:25 AM by Rexene Agent, [place] Prenatal Labs  Dating L/6 Blood type: O/Positive/-- (06/02 1109)   Genetic Screen NIPS: Diploid x 2 Antibody:Negative (06/02 1109)  Anatomic [place] Normal for both Rubella: 12.10 (06/02 1109)  Varicella: Immune  GTT 28 wk: 91    RPR: Non Reactive (06/02 1109)   Rhogam N/A HBsAg: Negative (06/02 1109)   TDaP vaccine 10/21/2019                      HIV: Non Reactive (06/02 1109)   Flu Shot                                GBS:   Contraception  Pap: NILM 05/20/2019  CBB     CS/VBAC    Baby Food    Support Person  Maternal Medical History:   Past Medical History:  Diagnosis Date  . Migraine     Past Surgical History:  Procedure Laterality Date  . NO PAST SURGERIES    . WISDOM TOOTH EXTRACTION      Allergies  Allergen Reactions  . Imitrex [Sumatriptan] Swelling    Facial swelling and burning lips  . Penicillins Hives    Did it involve swelling of the face/tongue/throat, SOB, or low BP? No Did it involve sudden or severe rash/hives, skin peeling, or any reaction on the inside of your mouth or nose? Yes Did you need to seek medical attention at a hospital or doctor's office? Yes When did it last happen?childhood allergy If all above answers are "NO", may proceed with cephalosporin use.    Prior to Admission medications   Medication Sig  Start Date End Date Taking? Authorizing Provider  acetaminophen (TYLENOL) 500 MG tablet Take 1,000 mg by mouth every 8 (eight) hours as needed for headache.    Yes [provider]  doxylamine, Sleep, (UNISOM) 25 MG tablet Take 25 mg by mouth at bedtime.    Yes [provider]  FIBER ADULT GUMMIES PO Take 2-3 capsules by mouth daily as needed (constipation).   Yes [provider]  Prenatal MV & Min w/FA-DHA (ONE A DAY PRENATAL) 0.4-25 MG CHEW Chew 1 tablet by mouth every evening.    Yes [provider]    OB History  Gravida Para Term Preterm AB Living  1 0 0 0 0 0  SAB TAB Ectopic Multiple Live Births  0 0 0 0 0    # Outcome Date GA Lbr Len/2nd Weight Sex Delivery Anes PTL Lv  1 Current             Prenatal care site: [facility]  Social History: She  reports that she has never smoked. She has never used smokeless tobacco. She reports that she does not drink alcohol or use drugs.  Family History: family history includes Asthma in her maternal grandmother; Breast cancer (age of onset: 12) in her maternal aunt; Diabetes in her paternal grandfather and paternal grandmother; Hypertension in her maternal grandfather, maternal grandmother, paternal grandfather, and paternal grandmother; Migraines in her mother; Pancreatic cancer (age of onset: 101) in her maternal grandfather; Skin cancer in her father; Thyroid disease in her maternal aunt and maternal grandmother.   Review of Systems  Constitutional: Negative.   HENT: Negative.   Eyes: Negative.   Respiratory: Negative.   Cardiovascular: Negative.   Gastrointestinal: Negative.   Genitourinary: Negative.   Musculoskeletal: Negative.   Skin: Negative.   Neurological: Negative.   Psychiatric/Behavioral: Negative.      Physical Exam:  BP 118/74   Ht 5\' 4"  (1.626 m)   Wt 166 lb (75.3 kg)   LMP 04/04/2019 (Exact Date)   BMI 28.49 kg/m   Physical Exam Constitutional:      General: She is not in  acute distress.    Appearance: Normal appearance. She is well-developed.  HENT:     Head: Normocephalic and atraumatic.  Eyes:     General: No scleral icterus.    Conjunctiva/sclera: Conjunctivae normal.  Cardiovascular:     Rate and Rhythm: Normal rate and regular rhythm.     Heart sounds: No murmur. No friction rub. No gallop.   Pulmonary:     Effort: Pulmonary effort is normal. No respiratory distress.     Breath sounds: Normal breath sounds. No wheezing or rales.  Abdominal:  General: Bowel sounds are normal. There is no distension.     Palpations: Abdomen is soft. There is no mass.     Tenderness: There is no abdominal tenderness. There is no guarding or rebound.  Musculoskeletal:        General: Normal range of motion.     Cervical back: Normal range of motion and neck supple.  Neurological:     General: No focal deficit present.     Mental Status: She is alert and oriented to person, place, and time.     Cranial Nerves: No cranial nerve deficit.  Skin:    General: Skin is warm and dry.     Findings: No erythema.  Psychiatric:        Mood and Affect: Mood normal.        Behavior: Behavior normal.        Judgment: Judgment normal.      Pertinent Results:    No results found for: SARSCOV2NAA]  Assessment:  Amanda [name] is a 23 y.o. G59P0000 female at [redacted]w[redacted]d with mono-di twins with mild fetal growth restriction of twin B.  Ultrasound today by [facility] confirms mild growth restriction of twin B.  Normal fluid, normal S/D ratio.  BPP 8/8 x 2 babies.  I spoke with Dr. Diamantina Monks this morning and she agrees with the plan for IOL vs C-section 12/18 depending on fetal presentation and maternal choice.    Plan:  1. Admit to Labor & Delivery  2. CBC, T&S, NPO, IVF 3. GBS positive - clinda resistant,  Abx per CDC 4. BMTZ 12/16 and 12/17. NST 12/17 at [facility]. 5. 12/18 present to L&D for presentation scan.  Consider IOL for cephalic presentation of twin A, c-section if non-cephalic.   Consents today. Covid swab on 12/16.    Prentice Docker, MD 12/01/2019 1:19 PM

## 2019-12-03 ENCOUNTER — Other Ambulatory Visit: Payer: Self-pay

## 2019-12-03 ENCOUNTER — Ambulatory Visit
Admission: RE | Admit: 2019-12-03 | Discharge: 2019-12-03 | Disposition: A | Discharge status: Home / Self Care | Payer: Medicaid Other | Attending: Certified Nurse Midwife | Admitting: Certified Nurse Midwife

## 2019-12-03 ENCOUNTER — Encounter
Admission: RE | Admit: 2019-12-03 | Discharge: 2019-12-03 | Disposition: A | Discharge status: Home / Self Care | Payer: Medicaid Other | Source: Ambulatory Visit | Attending: Obstetrics and Gynecology | Admitting: Obstetrics and Gynecology

## 2019-12-03 DIAGNOSIS — Z01812 Encounter for preprocedural laboratory examination: Secondary | ICD-10-CM | POA: Diagnosis present

## 2019-12-03 DIAGNOSIS — Z20828 Contact with and (suspected) exposure to other viral communicable diseases: Secondary | ICD-10-CM | POA: Insufficient documentation

## 2019-12-03 DIAGNOSIS — O0993 Supervision of high risk pregnancy, unspecified, third trimester: Secondary | ICD-10-CM | POA: Insufficient documentation

## 2019-12-03 DIAGNOSIS — Z01818 Encounter for other preprocedural examination: Secondary | ICD-10-CM | POA: Diagnosis not present

## 2019-12-03 DIAGNOSIS — O30033 Twin pregnancy, monochorionic/diamniotic, third trimester: Secondary | ICD-10-CM | POA: Insufficient documentation

## 2019-12-03 HISTORY — DX: Family history of other specified conditions: Z84.89

## 2019-12-03 HISTORY — DX: Gastro-esophageal reflux disease without esophagitis: K21.9

## 2019-12-03 MED ORDER — BETAMETHASONE SOD PHOS & ACET 6 (3-3) MG/ML IJ SUSP
INTRAMUSCULAR | Status: AC
  Administered 2019-12-03: 12 mg via INTRAMUSCULAR
  Filled 2019-12-03: qty 5

## 2019-12-03 MED ORDER — BETAMETHASONE SOD PHOS & ACET 6 (3-3) MG/ML IJ SUSP: 12.0000 mg | Freq: Once | INTRAMUSCULAR | Status: AC

## 2019-12-03 NOTE — Progress Notes (Signed)
Patient arrived fr betamethasone, received injection, no complaints. RN also obtained covid swab and CHG wipes per peri op which she did over the phone this AM. Patient left floor in stable condition and will return tomorrow for second BMZ dose and NST.

## 2019-12-03 NOTE — Patient Instructions (Addendum)
Your procedure is scheduled on: 12/05/2019 Fri at 10:30 am Call, phone # [phone], Labor and Delivery on arrival to the [facility] and someone will cone and escort you to L&D.     Remember: Instructions that are not followed completely may result in serious medical risk, up to and including death, or upon the discretion of your surgeon and anesthesiologist your surgery may need to be rescheduled.    _x___ 1. Do not eat food after midnight the night before your procedure. You may drink clear liquids up to 2 hours before you are scheduled to arrive at the hospital for your procedure.  Do not drink clear liquids within 2 hours of your scheduled arrival to the hospital.  Clear liquids include  --Water or Apple juice without pulp  --Clear carbohydrate beverage such as ClearFast or Gatorade  --Black Coffee or Clear Tea (No milk, no creamers, do not add anything to                  the coffee or Tea Type 1 and type 2 diabetics should only drink water.   ____Ensure clear carbohydrate drink on the way to the hospital for bariatric patients  ____Ensure clear carbohydrate drink 3 hours before surgery.   No gum chewing or hard candies.     __x__ 2. No Alcohol for 24 hours before or after surgery.   __x__3. No Smoking or e-cigarettes for 24 prior to surgery.  Do not use any chewable tobacco products for at least 6 hour prior to surgery   ____  4. Bring all medications with you on the day of surgery if instructed.    __x__ 5. Notify your doctor if there is any change in your medical condition     (cold, fever, infections).    x___6. On the morning of surgery brush your teeth with toothpaste and water.  You may rinse your mouth with mouth wash if you wish.  Do not swallow any toothpaste or mouthwash.   Do not wear jewelry, make-up, hairpins, clips or nail polish.  Do not wear lotions, powders, or perfumes. You may wear deodorant.  Do not shave 48 hours prior to surgery. Men may shave face  and neck.  Do not bring valuables to the hospital.    [facility] is not responsible for any belongings or valuables.               Contacts, dentures or bridgework may not be worn into surgery.  Leave your suitcase in the car. After surgery it may be brought to your room.  For patients admitted to the hospital, discharge time is determined by your                       treatment team.  _  Patients discharged the day of surgery will not be allowed to drive home.  You will need someone to drive you home and stay with you the night of your procedure.    Please read over the following fact sheets that you were given:   [facility] Preparing for Surgery and or MRSA Information   _x___ Take anti-hypertensive listed below, cardiac, seizure, asthma,     anti-reflux and psychiatric medicines. These include:  1. None  2.  3.  4.  5.  6.  ____Fleets enema or Magnesium Citrate as directed.   _x___ Use CHG Soap or sage wipes as directed on instruction sheet   ____ Use inhalers on the  day of surgery and bring to hospital day of surgery  ____ Stop Metformin and Janumet 2 days prior to surgery.    ____ Take 1/2 of usual insulin dose the night before surgery and none on the morning     surgery.   _x___ Follow recommendations from Cardiologist, Pulmonologist or PCP regarding          stopping Aspirin, Coumadin, Plavix ,Eliquis, Effient, or Pradaxa, and Pletal.  X____Stop Anti-inflammatories such as Advil, Aleve, Ibuprofen, Motrin, Naproxen, Naprosyn, Goodies powders or aspirin products. OK to take Tylenol and                          Celebrex.   _x___ Stop supplements until after surgery.  But may continue Vitamin D, Vitamin B,       and multivitamin.   ____ Bring C-Pap to the hospital.

## 2019-12-04 ENCOUNTER — Other Ambulatory Visit: Payer: Self-pay

## 2019-12-04 ENCOUNTER — Inpatient Hospital Stay
Admission: RE | Admit: 2019-12-04 | Discharge: 2019-12-04 | Disposition: A | Discharge status: Home / Self Care | Payer: Medicaid Other | Source: Home / Self Care | Attending: Obstetrics & Gynecology | Admitting: Obstetrics & Gynecology

## 2019-12-04 ENCOUNTER — Encounter: Payer: Self-pay | Admitting: Obstetrics & Gynecology

## 2019-12-04 ENCOUNTER — Encounter: Payer: Medicaid Other | Admitting: Obstetrics and Gynecology

## 2019-12-04 DIAGNOSIS — O0990 Supervision of high risk pregnancy, unspecified, unspecified trimester: Secondary | ICD-10-CM

## 2019-12-04 DIAGNOSIS — O321XX2 Maternal care for breech presentation, fetus 2: Secondary | ICD-10-CM | POA: Insufficient documentation

## 2019-12-04 DIAGNOSIS — O365932 Maternal care for other known or suspected poor fetal growth, third trimester, fetus 2: Secondary | ICD-10-CM | POA: Insufficient documentation

## 2019-12-04 DIAGNOSIS — O30033 Twin pregnancy, monochorionic/diamniotic, third trimester: Secondary | ICD-10-CM

## 2019-12-04 DIAGNOSIS — IMO0002 Reserved for concepts with insufficient information to code with codable children: Secondary | ICD-10-CM

## 2019-12-04 DIAGNOSIS — Z3A35 35 weeks gestation of pregnancy: Secondary | ICD-10-CM | POA: Insufficient documentation

## 2019-12-04 DIAGNOSIS — Z3A34 34 weeks gestation of pregnancy: Secondary | ICD-10-CM | POA: Diagnosis not present

## 2019-12-04 LAB — TYPE AND SCREEN
ABO/RH(D): O POS
Antibody Screen: NEGATIVE

## 2019-12-04 LAB — CBC WITH DIFFERENTIAL/PLATELET
Abs Immature Granulocytes: 0.12 10*3/uL — ABNORMAL HIGH (ref 0.00–0.07)
Basophils Absolute: 0 10*3/uL (ref 0.0–0.1)
Basophils Relative: 0 %
Eosinophils Absolute: 0 10*3/uL (ref 0.0–0.5)
Eosinophils Relative: 0 %
HCT: 36.3 % (ref 36.0–46.0)
Hemoglobin: 12.8 g/dL (ref 12.0–15.0)
Immature Granulocytes: 1 %
Lymphocytes Relative: 13 %
Lymphs Abs: 1.6 10*3/uL (ref 0.7–4.0)
MCH: 29.4 pg (ref 26.0–34.0)
MCHC: 35.3 g/dL (ref 30.0–36.0)
MCV: 83.4 fL (ref 80.0–100.0)
Monocytes Absolute: 1.1 10*3/uL — ABNORMAL HIGH (ref 0.1–1.0)
Monocytes Relative: 9 %
Neutro Abs: 9.4 10*3/uL — ABNORMAL HIGH (ref 1.7–7.7)
Neutrophils Relative %: 77 %
Platelets: 229 10*3/uL (ref 150–400)
RBC: 4.35 MIL/uL (ref 3.87–5.11)
RDW: 12.3 % (ref 11.5–15.5)
WBC: 12.2 10*3/uL — ABNORMAL HIGH (ref 4.0–10.5)
nRBC: 0 % (ref 0.0–0.2)

## 2019-12-04 LAB — NOVEL CORONAVIRUS, NAA (HOSP ORDER, SEND-OUT TO REF LAB; TAT 18-24 HRS): SARS-CoV-2, NAA: NOT DETECTED

## 2019-12-04 MED ORDER — LACTATED RINGERS IV BOLUS
1000.0000 mL | Freq: Once | INTRAVENOUS | Status: AC
  Administered 2019-12-04: 1000 mL via INTRAVENOUS

## 2019-12-04 MED ORDER — BETAMETHASONE SOD PHOS & ACET 6 (3-3) MG/ML IJ SUSP
12.0000 mg | Freq: Once | INTRAMUSCULAR | Status: AC
  Administered 2019-12-04: 12 mg via INTRAMUSCULAR
  Filled 2019-12-04: qty 5

## 2019-12-04 NOTE — Discharge Summary (Signed)
  See FPN

## 2019-12-04 NOTE — Progress Notes (Signed)
Harris MD preformed bedside US to assist with finding fetal HRs. Baby A vertex, Baby B is breech. MD discussed options and plan of care. Pt verbalizes understanding.
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
## 2019-12-04 NOTE — OB Triage Note (Signed)
Patient arrived for schedule NST and second dose of BMZ. Pt does complain of having  pain last night and being uncomfortable and it was hard to breath like baby was in her ribs. Pt states both babies are moving well, no bleeding or leaking of fluid. Pt unsure about contractions but did have one of "those pains" right before she got here.

## 2019-12-05 ENCOUNTER — Encounter
Admission: RE | Disposition: A | Discharge status: Home / Self Care | Payer: Self-pay | Source: Home / Self Care | Attending: Obstetrics & Gynecology

## 2019-12-05 ENCOUNTER — Inpatient Hospital Stay
Admission: RE | Admit: 2019-12-05 | Discharge: 2019-12-08 | DRG: 807 | Disposition: A | Discharge status: Home / Self Care | Payer: Medicaid Other | Attending: Obstetrics & Gynecology | Admitting: Obstetrics & Gynecology

## 2019-12-05 ENCOUNTER — Encounter: Payer: Self-pay | Admitting: Obstetrics and Gynecology

## 2019-12-05 DIAGNOSIS — O30039 Twin pregnancy, monochorionic/diamniotic, unspecified trimester: Secondary | ICD-10-CM | POA: Diagnosis present

## 2019-12-05 DIAGNOSIS — Z3A35 35 weeks gestation of pregnancy: Secondary | ICD-10-CM | POA: Diagnosis not present

## 2019-12-05 DIAGNOSIS — O30033 Twin pregnancy, monochorionic/diamniotic, third trimester: Secondary | ICD-10-CM | POA: Diagnosis not present

## 2019-12-05 DIAGNOSIS — O0990 Supervision of high risk pregnancy, unspecified, unspecified trimester: Secondary | ICD-10-CM | POA: Diagnosis not present

## 2019-12-05 DIAGNOSIS — Z880 Allergy status to penicillin: Secondary | ICD-10-CM

## 2019-12-05 DIAGNOSIS — O365932 Maternal care for other known or suspected poor fetal growth, third trimester, fetus 2: Secondary | ICD-10-CM | POA: Diagnosis not present

## 2019-12-05 DIAGNOSIS — O99824 Streptococcus B carrier state complicating childbirth: Secondary | ICD-10-CM | POA: Diagnosis not present

## 2019-12-05 DIAGNOSIS — Z13220 Encounter for screening for lipoid disorders: Secondary | ICD-10-CM

## 2019-12-05 DIAGNOSIS — O321XX2 Maternal care for breech presentation, fetus 2: Secondary | ICD-10-CM | POA: Diagnosis present

## 2019-12-05 DIAGNOSIS — Z3490 Encounter for supervision of normal pregnancy, unspecified, unspecified trimester: Secondary | ICD-10-CM | POA: Diagnosis present

## 2019-12-05 DIAGNOSIS — IMO0002 Reserved for concepts with insufficient information to code with codable children: Secondary | ICD-10-CM

## 2019-12-05 LAB — TYPE AND SCREEN
ABO/RH(D): O POS
Antibody Screen: NEGATIVE

## 2019-12-05 LAB — CBC
HCT: 34.7 % — ABNORMAL LOW (ref 36.0–46.0)
Hemoglobin: 12.1 g/dL (ref 12.0–15.0)
MCH: 30.3 pg (ref 26.0–34.0)
MCHC: 34.9 g/dL (ref 30.0–36.0)
MCV: 87 fL (ref 80.0–100.0)
Platelets: 216 10*3/uL (ref 150–400)
RBC: 3.99 MIL/uL (ref 3.87–5.11)
RDW: 12.2 % (ref 11.5–15.5)
WBC: 11.7 10*3/uL — ABNORMAL HIGH (ref 4.0–10.5)
nRBC: 0 % (ref 0.0–0.2)

## 2019-12-05 LAB — CREATININE, SERUM
Creatinine, Ser: 0.69 mg/dL (ref 0.44–1.00)
GFR calc Af Amer: >60 mL/min (ref >60)
GFR calc non Af Amer: >60 mL/min (ref >60)

## 2019-12-05 LAB — RPR: RPR Ser Ql: NONREACTIVE

## 2019-12-05 SURGERY — Surgical Case
Anesthesia: Epidural

## 2019-12-05 MED ORDER — LACTATED RINGERS IV SOLN: INTRAVENOUS | Status: DC

## 2019-12-05 MED ORDER — ONDANSETRON HCL 4 MG/2ML IJ SOLN: 4.0000 mg | Freq: Four times a day (QID) | INTRAMUSCULAR | Status: DC | PRN

## 2019-12-05 MED ORDER — MISOPROSTOL 25 MCG QUARTER TABLET
ORAL_TABLET | ORAL | Status: AC
  Filled 2019-12-05: qty 2

## 2019-12-05 MED ORDER — MISOPROSTOL 200 MCG PO TABS
ORAL_TABLET | ORAL | Status: AC
  Filled 2019-12-05: qty 5

## 2019-12-05 MED ORDER — LIDOCAINE HCL (PF) 1 % IJ SOLN
INTRAMUSCULAR | Status: AC
  Filled 2019-12-05: qty 30

## 2019-12-05 MED ORDER — VANCOMYCIN HCL IN DEXTROSE 1-5 GM/200ML-% IV SOLN
1000.0000 mg | Freq: Two times a day (BID) | INTRAVENOUS | Status: DC
  Administered 2019-12-05 – 2019-12-06 (×3): 1000 mg via INTRAVENOUS
  Filled 2019-12-05 (×6): qty 200

## 2019-12-05 MED ORDER — MISOPROSTOL 25 MCG QUARTER TABLET
25.0000 ug | ORAL_TABLET | ORAL | Status: DC | PRN
  Administered 2019-12-05 (×3): 25 ug via VAGINAL
  Filled 2019-12-05 (×2): qty 1

## 2019-12-05 MED ORDER — OXYTOCIN 10 UNIT/ML IJ SOLN
INTRAMUSCULAR | Status: AC
  Filled 2019-12-05: qty 2

## 2019-12-05 MED ORDER — TERBUTALINE SULFATE 1 MG/ML IJ SOLN: 0.2500 mg | Freq: Once | INTRAMUSCULAR | Status: DC | PRN

## 2019-12-05 MED ORDER — AMMONIA AROMATIC IN INHA: 0.3000 mL | Freq: Once | RESPIRATORY_TRACT | Status: DC | PRN

## 2019-12-05 MED ORDER — OXYTOCIN 40 UNITS IN NORMAL SALINE INFUSION - SIMPLE MED
2.5000 [IU]/h | INTRAVENOUS | Status: DC
  Administered 2019-12-06: 2.5 [IU]/h via INTRAVENOUS
  Filled 2019-12-05: qty 1000

## 2019-12-05 MED ORDER — LACTATED RINGERS IV SOLN: 500.0000 mL | INTRAVENOUS | Status: DC | PRN

## 2019-12-05 MED ORDER — CARBOPROST TROMETHAMINE 250 MCG/ML IM SOLN
INTRAMUSCULAR | Status: AC
  Filled 2019-12-05: qty 1

## 2019-12-05 MED ORDER — BUTORPHANOL TARTRATE 1 MG/ML IJ SOLN
1.0000 mg | INTRAMUSCULAR | Status: DC | PRN
  Administered 2019-12-05 – 2019-12-06 (×2): 1 mg via INTRAVENOUS
  Filled 2019-12-05 (×2): qty 1

## 2019-12-05 MED ORDER — LIDOCAINE HCL (PF) 1 % IJ SOLN: 30.0000 mL | INTRAMUSCULAR | Status: DC | PRN

## 2019-12-05 MED ORDER — MISOPROSTOL 200 MCG PO TABS
ORAL_TABLET | ORAL | Status: AC
  Filled 2019-12-05: qty 4

## 2019-12-05 MED ORDER — MISOPROSTOL 200 MCG PO TABS: 800.0000 ug | ORAL_TABLET | Freq: Once | ORAL | Status: DC | PRN

## 2019-12-05 MED ORDER — OXYTOCIN BOLUS FROM INFUSION
500.0000 mL | Freq: Once | INTRAVENOUS | Status: AC
  Administered 2019-12-06: 500 mL via INTRAVENOUS

## 2019-12-05 MED ORDER — METHYLERGONOVINE MALEATE 0.2 MG/ML IJ SOLN
INTRAMUSCULAR | Status: AC
  Filled 2019-12-05: qty 1

## 2019-12-05 MED ORDER — AMMONIA AROMATIC IN INHA
RESPIRATORY_TRACT | Status: AC
  Filled 2019-12-05: qty 10

## 2019-12-05 SURGICAL SUPPLY — 30 items
CANISTER SUCT 3000ML PPV (MISCELLANEOUS) ×3 IMPLANT
CATH KIT ON-Q SILVERSOAK 5IN (CATHETERS) ×6 IMPLANT
CLOSURE WOUND 1/2 X4 (GAUZE/BANDAGES/DRESSINGS) ×1
COVER WAND RF STERILE (DRAPES) ×3 IMPLANT
DERMABOND ADVANCED (GAUZE/BANDAGES/DRESSINGS) ×2
DERMABOND ADVANCED .7 DNX12 (GAUZE/BANDAGES/DRESSINGS) ×1 IMPLANT
DRSG OPSITE POSTOP 4X10 (GAUZE/BANDAGES/DRESSINGS) ×3 IMPLANT
DRSG TELFA 3X8 NADH (GAUZE/BANDAGES/DRESSINGS) ×3 IMPLANT
ELECT CAUTERY BLADE 6.4 (BLADE) ×3 IMPLANT
ELECT REM PT RETURN 9FT ADLT (ELECTROSURGICAL) ×3
ELECTRODE REM PT RTRN 9FT ADLT (ELECTROSURGICAL) ×1 IMPLANT
GAUZE SPONGE 4X4 12PLY STRL (GAUZE/BANDAGES/DRESSINGS) ×3 IMPLANT
GLOVE BIO SURGEON STRL SZ7 (GLOVE) ×3 IMPLANT
GLOVE INDICATOR 7.5 STRL GRN (GLOVE) ×3 IMPLANT
GOWN STRL REUS W/ TWL LRG LVL3 (GOWN DISPOSABLE) ×3 IMPLANT
GOWN STRL REUS W/TWL LRG LVL3 (GOWN DISPOSABLE) ×6
NS IRRIG 1000ML POUR BTL (IV SOLUTION) ×3 IMPLANT
PACK C SECTION AR (MISCELLANEOUS) ×3 IMPLANT
PAD OB MATERNITY 4.3X12.25 (PERSONAL CARE ITEMS) ×6 IMPLANT
PAD PREP 24X41 OB/GYN DISP (PERSONAL CARE ITEMS) ×3 IMPLANT
PENCIL SMOKE ULTRAEVAC 22 CON (MISCELLANEOUS) ×3 IMPLANT
STRIP CLOSURE SKIN 1/2X4 (GAUZE/BANDAGES/DRESSINGS) ×2 IMPLANT
SUT MNCRL 4-0 (SUTURE) ×2
SUT MNCRL 4-0 27XMFL (SUTURE) ×1
SUT PDS AB 1 TP1 96 (SUTURE) ×3 IMPLANT
SUT PLAIN GUT 0 (SUTURE) IMPLANT
SUT VIC AB 0 CTX 36 (SUTURE) ×4
SUT VIC AB 0 CTX36XBRD ANBCTRL (SUTURE) ×2 IMPLANT
SUTURE MNCRL 4-0 27XMF (SUTURE) ×1 IMPLANT
SWABSTK COMLB BENZOIN TINCTURE (MISCELLANEOUS) ×3 IMPLANT

## 2019-12-05 NOTE — H&P (Signed)
Date of Initial H&P: 01 December 2019  History reviewed, patient examined, no change in status, stable for induction   Amanda [name] is a 23 y.o. G54P0000 female with EDC=01/09/2020 at 35 weeks dated by L=6.  Her pregnancy has been complicated by monochorionic, diamniotic twin gestation, fetal growth restriction (twin B, mild).  She is admitted today for IOL. Baby A (maternal right) is cephalic and Twin B (maternal left) is breech per Dr Marisue Brooklyn ultrasound this AM. She has been counseled on risks / benefits of IOL vs Cesarean section for delivery of the twins on several occasions. BMZ x2 given this week. She desires vaginal delivery.  Twin A EFW on 12/14 was 4#14oz (25th%) and Twin B EFW 4#8ooz (10th%). Dopplers on twins 12/14 were normal (previously Dopples on twin B were elevated) and NSTs have been reactive.   Her care at [facility] in consultation with [place] has also been remarkable for the following: Clinic [facility] Prenatal Labs  Dating L/6 Blood type: O/Positive/-- (06/02 1109)   Genetic Screen NIPS: Diploid x 2 Antibody:Negative (06/02 1109)  Anatomic [place] Normal for both Rubella: 12.10 (06/02 1109)  Varicella: Immune  GTT 28 wk: 91    RPR: Non Reactive (06/02 1109)   Rhogam N/A HBsAg: Negative (06/02 1109)   TDaP vaccine 10/21/2019                      HIV: Non Reactive (06/02 1109)   Flu Shot                                GBS: positive resistant to Clindamycin  Contraception minipill Pap: NILM 05/20/2019  CBB     CS/VBAC    Baby Food Breast   Support Person      Allergies:  Allergies  Allergen Reactions  . Imitrex [Sumatriptan] Swelling    Facial swelling and burning lips  . Penicillins Hives    Did it involve swelling of the face/tongue/throat, SOB, or low BP? No Did it involve sudden or severe rash/hives, skin peeling, or any reaction on the inside of your mouth or nose? Yes Did you need to seek medical attention at a hospital or doctor's office? Yes When did it  last happen?childhood allergy If all above answers are "NO", may proceed with cephalosporin use.   Dore is allergic to penicillin and the GBS is positive and is resistant to clindamycin, so will need to cover with Vancomycin when making some progress.   Exam:  General: gravid female appears comfortable Vital signs: 122/76 Pulse 109  FHR: Twin A: baseline 130 with accelerations to 160s, moderate variability Twin B: baseline 140 with accelerations to 160s, moderate variability Toco: contractions every 6- 9 minutes apart  Cervix: closed 50%/-3  A: Mono-di twins at 35 weeks for IOL due to concerns for IUGR with Twin B GBS positive Bishop score 3 Reassuring  Fetal heart tracing x 2  P: Reviewed risks of induction including hyperstimulation, fetal intolerance, Cesarean section and risks of breech delivery with Twin B. Also discussed possibility of needing a Cesarean section for delivery of twin B after twin A is delivered.  Plan delivery in the OR Cytotec 25 mcg inserted to start induction GBS positive: plan Vancomycin for GBS PPX O POS/ RI/ VI Breast TDAP-given 10/21/2019 Plans epidural for pain control Contraception: minipill Labs CBC, RPR, T&S Continuous fetal monitoring    Dalia Heading, CNM

## 2019-12-05 NOTE — Progress Notes (Signed)
Labor Check  Subj:  Complaints: feels cramping, more uncomfortable   Obj:  BP 117/70   Pulse 85   Temp 99.3 F (37.4 C) (Oral)   Resp 16   Ht 5\' 5"  (1.651 m)   Wt 74.8 kg   LMP 04/04/2019 (Exact Date)   SpO2 99%   BMI 27.46 kg/m     Cervix: Dilation: Fingertip / Effacement (%): 81 / Station: -3   Foley balloon placed under direct visualization, balloon filled with 40 mL sterile saline  Baby A:  Baseline FHR: 140 beats/min   Variability: moderate   Accelerations: present   Decelerations: absent  Baby B:  Baseline FHR: 140 beats/min   Variability: moderate   Accelerations: present   Decelerations: absent  Contractions: present frequency: 4-5 q 10 min Overall assessment: cat 1 x 2  A/P: 23 y.o. G1P0000 female at [redacted]w[redacted]d with mo/di twin, fetal growth restriction baby B.  1.  Labor: continue misoprostol, foley balloon added at this check  2.  FWB: reassuring x 2, Overall assessment: category 1 for A and B  3.  GBS positive - vancomycin  4.  Pain: prn 5.  Recheck: prn   Prentice Docker, MD, [facility], [facility] 12/05/2019 9:16 PM

## 2019-12-06 ENCOUNTER — Inpatient Hospital Stay: Payer: Medicaid Other | Admitting: Anesthesiology

## 2019-12-06 ENCOUNTER — Encounter: Payer: Self-pay | Admitting: Obstetrics and Gynecology

## 2019-12-06 DIAGNOSIS — O365932 Maternal care for other known or suspected poor fetal growth, third trimester, fetus 2: Secondary | ICD-10-CM

## 2019-12-06 DIAGNOSIS — O321XX2 Maternal care for breech presentation, fetus 2: Secondary | ICD-10-CM

## 2019-12-06 MED ORDER — SODIUM CHLORIDE 0.9 % IV SOLN
INTRAVENOUS | Status: DC | PRN
  Administered 2019-12-06 (×2): 5 mL via EPIDURAL

## 2019-12-06 MED ORDER — LIDOCAINE HCL 2 % IJ SOLN
INTRAMUSCULAR | Status: AC
  Filled 2019-12-06: qty 10

## 2019-12-06 MED ORDER — EPHEDRINE 5 MG/ML INJ: 10.0000 mg | INTRAVENOUS | Status: DC | PRN

## 2019-12-06 MED ORDER — SIMETHICONE 80 MG PO CHEW: 80.0000 mg | CHEWABLE_TABLET | ORAL | Status: DC | PRN

## 2019-12-06 MED ORDER — IBUPROFEN 600 MG PO TABS
600.0000 mg | ORAL_TABLET | Freq: Four times a day (QID) | ORAL | Status: DC
  Administered 2019-12-07 – 2019-12-08 (×6): 600 mg via ORAL
  Filled 2019-12-06 (×7): qty 1

## 2019-12-06 MED ORDER — WITCH HAZEL-GLYCERIN EX PADS
1.0000 "application " | MEDICATED_PAD | CUTANEOUS | Status: DC | PRN
  Administered 2019-12-07: 1 via TOPICAL
  Filled 2019-12-06: qty 100

## 2019-12-06 MED ORDER — TERBUTALINE SULFATE 1 MG/ML IJ SOLN: 0.2500 mg | Freq: Once | INTRAMUSCULAR | Status: DC | PRN

## 2019-12-06 MED ORDER — OXYCODONE-ACETAMINOPHEN 5-325 MG PO TABS: 2.0000 | ORAL_TABLET | ORAL | Status: DC | PRN

## 2019-12-06 MED ORDER — SENNOSIDES-DOCUSATE SODIUM 8.6-50 MG PO TABS
2.0000 | ORAL_TABLET | ORAL | Status: DC
  Administered 2019-12-07: 2 via ORAL
  Filled 2019-12-06: qty 2

## 2019-12-06 MED ORDER — SODIUM CHLORIDE 0.9 % IV SOLN: 250.0000 mL | INTRAVENOUS | Status: DC | PRN

## 2019-12-06 MED ORDER — SODIUM CHLORIDE 0.9% FLUSH: 3.0000 mL | INTRAVENOUS | Status: DC | PRN

## 2019-12-06 MED ORDER — ONDANSETRON HCL 4 MG PO TABS: 4.0000 mg | ORAL_TABLET | ORAL | Status: DC | PRN

## 2019-12-06 MED ORDER — DIPHENHYDRAMINE HCL 25 MG PO CAPS: 25.0000 mg | ORAL_CAPSULE | Freq: Four times a day (QID) | ORAL | Status: DC | PRN

## 2019-12-06 MED ORDER — COCONUT OIL OIL
1.0000 "application " | TOPICAL_OIL | Status: DC | PRN
  Administered 2019-12-07: 1 via TOPICAL
  Filled 2019-12-06: qty 120

## 2019-12-06 MED ORDER — LIDOCAINE HCL (PF) 1 % IJ SOLN
INTRAMUSCULAR | Status: DC | PRN
  Administered 2019-12-06: 3 mL via SUBCUTANEOUS

## 2019-12-06 MED ORDER — PHENYLEPHRINE 40 MCG/ML (10ML) SYRINGE FOR IV PUSH (FOR BLOOD PRESSURE SUPPORT): 80.0000 ug | PREFILLED_SYRINGE | INTRAVENOUS | Status: DC | PRN

## 2019-12-06 MED ORDER — FENTANYL 2.5 MCG/ML W/ROPIVACAINE 0.15% IN NS 100 ML EPIDURAL (ARMC)
12.0000 mL/h | EPIDURAL | Status: DC
  Administered 2019-12-06: 12 mL/h via EPIDURAL

## 2019-12-06 MED ORDER — OXYCODONE-ACETAMINOPHEN 5-325 MG PO TABS
1.0000 | ORAL_TABLET | ORAL | Status: DC | PRN
  Administered 2019-12-07: 1 via ORAL
  Filled 2019-12-06: qty 1

## 2019-12-06 MED ORDER — DIBUCAINE (PERIANAL) 1 % EX OINT
1.0000 "application " | TOPICAL_OINTMENT | CUTANEOUS | Status: DC | PRN
  Administered 2019-12-07: 1 via RECTAL
  Filled 2019-12-06: qty 28

## 2019-12-06 MED ORDER — BENZOCAINE-MENTHOL 20-0.5 % EX AERO
1.0000 "application " | INHALATION_SPRAY | CUTANEOUS | Status: DC | PRN
  Filled 2019-12-06: qty 56

## 2019-12-06 MED ORDER — FENTANYL 2.5 MCG/ML W/ROPIVACAINE 0.15% IN NS 100 ML EPIDURAL (ARMC)
EPIDURAL | Status: AC
  Filled 2019-12-06: qty 100

## 2019-12-06 MED ORDER — ZOLPIDEM TARTRATE 5 MG PO TABS: 5.0000 mg | ORAL_TABLET | Freq: Every evening | ORAL | Status: DC | PRN

## 2019-12-06 MED ORDER — LIDOCAINE-EPINEPHRINE (PF) 1.5 %-1:200000 IJ SOLN
INTRAMUSCULAR | Status: DC | PRN
  Administered 2019-12-06: 3 mL via EPIDURAL

## 2019-12-06 MED ORDER — OXYTOCIN 40 UNITS IN NORMAL SALINE INFUSION - SIMPLE MED
1.0000 m[IU]/min | INTRAVENOUS | Status: DC
  Administered 2019-12-06: 1 m[IU]/min via INTRAVENOUS

## 2019-12-06 MED ORDER — ONDANSETRON HCL 4 MG/2ML IJ SOLN: 4.0000 mg | INTRAMUSCULAR | Status: DC | PRN

## 2019-12-06 MED ORDER — ACETAMINOPHEN 325 MG PO TABS
650.0000 mg | ORAL_TABLET | ORAL | Status: DC | PRN
  Administered 2019-12-07 – 2019-12-08 (×3): 650 mg via ORAL
  Filled 2019-12-06 (×3): qty 2

## 2019-12-06 MED ORDER — SODIUM BICARBONATE 8.4 % IV SOLN
INTRAVENOUS | Status: AC
  Filled 2019-12-06: qty 50

## 2019-12-06 MED ORDER — DIPHENHYDRAMINE HCL 50 MG/ML IJ SOLN: 12.5000 mg | INTRAMUSCULAR | Status: DC | PRN

## 2019-12-06 MED ORDER — LACTATED RINGERS IV SOLN
500.0000 mL | Freq: Once | INTRAVENOUS | Status: AC
  Administered 2019-12-06: 500 mL via INTRAVENOUS

## 2019-12-06 MED ORDER — SODIUM CHLORIDE 0.9% FLUSH: 3.0000 mL | Freq: Two times a day (BID) | INTRAVENOUS | Status: DC

## 2019-12-06 NOTE — Progress Notes (Signed)
  Labor Progress Note   23 y.o. G1P0000 @ [redacted]w[redacted]d , admitted for Twin IOL (Mo-Di IUGR twin B)  Subjective:  Mild ctx pains Cytotec x2 Foley bulb last night but came out early this am  Objective:  BP 116/71 (BP Location: Right Arm)   Pulse 72   Temp (!) 97.4 F (36.3 C) (Oral)   Resp 18   Ht 5\' 5"  (1.651 m)   Wt 74.8 kg   LMP 04/04/2019 (Exact Date)   SpO2 99%   BMI 27.46 kg/m  Abd: gravid, ND, FHT present, mild tenderness on exam Extr: trace to 1+ bilateral pedal edema SVE: CERVIX: 3 cm dilated, 65 effaced, -3 station, presenting part Vtx MEMBRANES: intact  EFM: FHR: 130 x2 bpm, variability: moderate,  accelerations:  Present,  decelerations:  Absent Toco: Frequency: Every 3-4 minutes Labs: I have reviewed the patient's lab results.   Assessment & Plan:  G1P0000 @ [redacted]w[redacted]d, admitted for  Pregnancy and Labor/Delivery Management  1. Pain management: none. 2. FWB: FHT category 1.  3. ID: GBS positive 4. Labor management: Will shift to Pitocin Discussed labor management and delivery planning w twins; double set up; risks for CS of one or both twins; breech extraction; recovery.  All discussed with patient, see orders  Barnett Applebaum, MD, [facility], [facility] 12/06/2019  8:46 AM

## 2019-12-06 NOTE — Discharge Instructions (Signed)

## 2019-12-06 NOTE — Discharge Summary (Signed)
OB Discharge Summary     Patient Name: Amanda [name] DOB: 1996/04/06 MRN: [id number]  Date of admission: 12/05/2019 Delivering MD: Hoyt Koch, MD  Date of Delivery: 12/06/2019  Date of discharge: 12/08/2019  Admitting diagnosis: Encounter for induction of labor [Z34.90] Intrauterine pregnancy: [redacted]w[redacted]d     Secondary diagnosis: Mono-Di Twins, IUGR     Discharge diagnosis: Preterm Pregnancy Delivered, Twins and IUGR                         Hospital course:  Induction of Labor With Vaginal Delivery   23 y.o. yo G1P0102 at [redacted]w[redacted]d was admitted to the hospital 12/05/2019 for induction of labor.  Indication for induction: IUGR, Twins.  Patient had an uncomplicated labor course as follows: Membrane Rupture Time/Date: 12/06/19. Patient had delivery of  Viable infants. Twin A vaginal delivery by pushing efforts.  Twin B by breech extraction. Details of delivery can be found in separate delivery note.  Patient had a routine postpartum course. She is pumping and getting colostrum. Patient is discharged home 12/08/19.                                                                 Post partum procedures:none  Complications: None  Physical exam on 12/08/2019: Vitals:   12/07/19 1929 12/07/19 2337 12/08/19 0352 12/08/19 0805  BP: (!) 143/99 (!) 125/96 125/83 (!) 133/96  Pulse: 70 70 70 97  Resp: 20 18 18 16   Temp: 98.5 F (36.9 C) 98 F (36.7 C) 98.5 F (36.9 C) 98.8 F (37.1 C)  TempSrc: Oral  Oral Oral  SpO2: 100% 100% 99% 98%  Weight:      Height:       General: alert, cooperative and no distress Lochia: appropriate Uterine Fundus: firm Incision: N/A DVT Evaluation: No evidence of DVT seen on physical exam.  Labs: Lab Results  Component Value Date   WBC 15.1 (H) 12/07/2019   HGB 10.3 (L) 12/07/2019   HCT 29.2 (L) 12/07/2019   MCV 83.7 12/07/2019   PLT 170 12/07/2019   CMP Latest Ref Rng & Units 12/05/2019  Glucose 70 - 99 mg/dL -  BUN 6 - 20 mg/dL -  Creatinine  0.44 - 1.00 mg/dL 0.69  Sodium 135 - 145 mmol/L -  Potassium 3.5 - 5.1 mmol/L -  Chloride 98 - 111 mmol/L -  CO2 22 - 32 mmol/L -  Calcium 8.9 - 10.3 mg/dL -  Total Protein 6.5 - 8.1 g/dL -  Total Bilirubin 0.3 - 1.2 mg/dL -  Alkaline Phos 38 - 126 U/L -  AST 15 - 41 U/L -  ALT 0 - 44 U/L -    Discharge instruction: per After Visit Summary.  Medications:  Allergies as of 12/08/2019      Reactions   Imitrex [sumatriptan] Swelling   Facial swelling and burning lips   Penicillins Hives   Did it involve swelling of the face/tongue/throat, SOB, or low BP? No Did it involve sudden or severe rash/hives, skin peeling, or any reaction on the inside of your mouth or nose? Yes Did you need to seek medical attention at a hospital or doctor's office? Yes When did it last happen?childhood allergy If all above answers are "  NO", may proceed with cephalosporin use.      Medication List    STOP taking these medications   doxylamine (Sleep) 25 MG tablet Commonly known as: UNISOM     TAKE these medications   acetaminophen 500 MG tablet Commonly known as: TYLENOL Take 1,000 mg by mouth every 8 (eight) hours as needed for headache.   FIBER ADULT GUMMIES PO Take 2-3 capsules by mouth daily as needed (constipation).   norethindrone 0.35 MG tablet Commonly known as: MICRONOR Take 1 tablet (0.35 mg total) by mouth daily. Start taking on: December 28, 2019   One A Day Prenatal 0.4-25 MG Chew Chew 1 tablet by mouth every evening.       Diet: routine diet  Activity: Advance as tolerated. Pelvic rest for 6 weeks.   Outpatient follow up: Follow-up Information    Gae Dry, MD. Schedule an appointment as soon as possible for a visit in 6 week(s).   Specialty: Obstetrics and Gynecology Contact information: [address] [place] [address] [phone]             Postpartum contraception: Progesterone only pills, fiance plans vasectomy Rhogam Given  postpartum: no Rubella vaccine given postpartum: no Varicella vaccine given postpartum: no TDaP given antepartum or postpartum: Yes  Newborn Data:   Ahri, Stegenga [id number]  Live born female: Drema Pry Birth Weight: 2030 g  APGAR: 106, 9  Newborn Delivery   Birth date/time: 12/06/2019 20:48:00 Delivery type:        Amely, Breitbarth [id number]  Live born female: Kirke Shaggy Birth Weight: 1860 g  APGAR: 6, 7  Newborn Delivery   Birth date/time: 12/06/2019 20:53:00 Delivery type:        Baby Feeding: Breast  Disposition:NICU  SIGNED:  Rod Can, CNM 12/08/2019 10:34 AM

## 2019-12-06 NOTE — Anesthesia Preprocedure Evaluation (Signed)
Anesthesia Evaluation  Patient identified by MRN, date of birth, ID band Patient awake    Reviewed: Allergy & Precautions, H&P , NPO status , Patient's Chart, lab work & pertinent test results, reviewed documented beta blocker date and time   History of Anesthesia Complications (+) Family history of anesthesia reaction and history of anesthetic complications  Airway Mallampati: II  TM Distance: >3 FB Neck ROM: full    Dental no notable dental hx.    Pulmonary neg pulmonary ROS,    Pulmonary exam normal        Cardiovascular Exercise Tolerance: Good negative cardio ROS Normal cardiovascular exam     Neuro/Psych  Headaches, neg Seizures negative psych ROS   GI/Hepatic Neg liver ROS, GERD  ,  Endo/Other  negative endocrine ROS  Renal/GU negative Renal ROS  negative genitourinary   Musculoskeletal   Abdominal   Peds  Hematology negative hematology ROS (+)   Anesthesia Other Findings Past Medical History: No date: Family history of adverse reaction to anesthesia     Comment:  heart condition No date: GERD (gastroesophageal reflux disease) No date: Migraine   Reproductive/Obstetrics negative OB ROS                             Anesthesia Physical Anesthesia Plan  ASA: II  Anesthesia Plan: Epidural   Post-op Pain Management:    Induction:   PONV Risk Score and Plan:   Airway Management Planned:   Additional Equipment:   Intra-op Plan:   Post-operative Plan:   Informed Consent: I have reviewed the patients History and Physical, chart, labs and discussed the procedure including the risks, benefits and alternatives for the proposed anesthesia with the patient or authorized representative who has indicated his/her understanding and acceptance.     Dental Advisory Given  Plan Discussed with: Anesthesiologist, CRNA and Surgeon  Anesthesia Plan Comments:          Anesthesia Quick Evaluation

## 2019-12-06 NOTE — Progress Notes (Signed)
  Labor Progress Note   23 y.o. G1P0000 @ [redacted]w[redacted]d , admitted for  Pregnancy, Labor Management.   Subjective:  Now complete Epidural helps w pain  Objective:  BP 126/65 (BP Location: Right Arm)   Pulse 71   Temp 99.8 F (37.7 C) (Oral)   Resp 18   Ht 5\' 5"  (1.651 m)   Wt 74.8 kg   LMP 04/04/2019 (Exact Date)   SpO2 98%   BMI 27.46 kg/m  Abd: gravid, ND, FHT present, mild tenderness on exam Extr: trace to 1+ bilateral pedal edema SVE: CERVIX: 10 cm dilated, 100 effaced, +1 station  EFM: FHR: 140/140 bpm, variability: moderate,  accelerations:  Present,  decelerations:  Absent Toco: Frequency: Every 2-4 minutes Labs: I have reviewed the patient's lab results.   Assessment & Plan:  G1P0000 @ [redacted]w[redacted]d, admitted for  Pregnancy and Labor/Delivery Management  1. Pain management: epidural. 2. FWB: FHT category 1.  3. ID: GBS positive 4. Labor management: Second stage as double set up preparations at this time  All discussed with patient, see orders  Barnett Applebaum, MD, [facility], [facility] 12/06/2019  8:05 PM

## 2019-12-06 NOTE — Anesthesia Procedure Notes (Signed)
Epidural Patient location during procedure: OB Start time: 12/06/2019 1:15 PM End time: 12/06/2019 1:25 PM  Staffing Anesthesiologist: Martha Clan, MD Performed: anesthesiologist   Preanesthetic Checklist Completed: patient identified, IV checked, site marked, risks and benefits discussed, surgical consent, monitors and equipment checked, pre-op evaluation and timeout performed  Epidural Patient position: sitting Prep: ChloraPrep Patient monitoring: heart rate, continuous pulse ox and blood pressure Approach: midline Location: L3-L4 Injection technique: LOR saline  Needle:  Needle type: Tuohy  Needle gauge: 17 G Needle length: 9 cm and 9 Needle insertion depth: 4 cm Catheter type: closed end flexible Catheter size: 19 Gauge Catheter at skin depth: 9 cm Test dose: negative and 1.5% lidocaine with Epi 1:200 K  Assessment Sensory level: T10 Events: blood not aspirated, injection not painful, no injection resistance, no paresthesia and negative IV test  Additional Notes 1st attempt Pt. Evaluated and documentation done after procedure finished. Patient identified. Risks/Benefits/Options discussed with patient including but not limited to bleeding, infection, nerve damage, paralysis, failed block, incomplete pain control, headache, blood pressure changes, nausea, vomiting, reactions to medication both or allergic, itching and postpartum back pain. Confirmed with bedside nurse the patient's most recent platelet count. Confirmed with patient that they are not currently taking any anticoagulation, have any bleeding history or any family history of bleeding disorders. Patient expressed understanding and wished to proceed. All questions were answered. Sterile technique was used throughout the entire procedure. Please see nursing notes for vital signs. Test dose was given through epidural catheter and negative prior to continuing to dose epidural or start infusion. Warning signs of high  block given to the patient including shortness of breath, tingling/numbness in hands, complete motor block, or any concerning symptoms with instructions to call for help. Patient was given instructions on fall risk and not to get out of bed. All questions and concerns addressed with instructions to call with any issues or inadequate analgesia.   Patient tolerated the insertion well without immediate complications.Reason for block:procedure for pain

## 2019-12-07 LAB — CBC
HCT: 29.2 % — ABNORMAL LOW (ref 36.0–46.0)
Hemoglobin: 10.3 g/dL — ABNORMAL LOW (ref 12.0–15.0)
MCH: 29.5 pg (ref 26.0–34.0)
MCHC: 35.3 g/dL (ref 30.0–36.0)
MCV: 83.7 fL (ref 80.0–100.0)
Platelets: 170 10*3/uL (ref 150–400)
RBC: 3.49 MIL/uL — ABNORMAL LOW (ref 3.87–5.11)
RDW: 12 % (ref 11.5–15.5)
WBC: 15.1 10*3/uL — ABNORMAL HIGH (ref 4.0–10.5)
nRBC: 0 % (ref 0.0–0.2)

## 2019-12-07 NOTE — Anesthesia Postprocedure Evaluation (Signed)
Anesthesia Post Note  Patient: Amanda [name]  Procedure(s) Performed: CESAREAN SECTION (N/A )  Patient location during evaluation: Mother Baby Anesthesia Type: Epidural Level of consciousness: awake and alert Pain management: pain level controlled Vital Signs Assessment: post-procedure vital signs reviewed and stable Respiratory status: spontaneous breathing, nonlabored ventilation and respiratory function stable Cardiovascular status: stable Postop Assessment: no headache, no backache and epidural receding Anesthetic complications: no     Last Vitals:  Vitals:   12/07/19 1330 12/07/19 1700  BP: (!) 124/95 (!) 128/99  Pulse: 71 68  Resp: 18 18  Temp: 36.7 C 36.8 C  SpO2: 99% 99%    Last Pain:  Vitals:   12/07/19 1700  TempSrc: Oral  PainSc:                  [name]

## 2019-12-07 NOTE — Lactation Note (Signed)
This note was copied from a baby's chart. Lactation Consultation Note  Patient Name: Amanda [name] Girl Today's Date: 12/07/2019   This is mom's third pumping since initiation.  She has gotten at least 5 ml with each pumping.  Mom purchased hands free pumping bra.  Praised mom for her commitment to pump every three hours for [place] in [facility].  Mom wants to breast feed when twins are ready to go to the breast.  Maternal Data    Feeding Feeding Type: Donor Breast Milk Nipple Type: Slow - flow  LATCH Score                   Interventions    Lactation Tools Discussed/Used     Consult Status      Jarold Motto 12/07/2019, 10:04 PM

## 2019-12-07 NOTE — Progress Notes (Signed)
Admit Date: 12/05/2019 Today's Date: 12/07/2019  Post Partum Day 1 s/p vaginal delivery of twins including breech extraction twin B; after IOL at 36 weeks for IUGR, small degree of TTTS noted by pediatrics after the fact in SCN; infants doing well with feeding assisatnce  Subjective:  no complaints, up ad lib, voiding and tolerating PO  Objective: Temp:  [97.9 F (36.6 C)-99.8 F (37.7 C)] 98.4 F (36.9 C) (12/20 0310) Pulse Rate:  [65-96] 78 (12/20 0310) Resp:  [18-20] 18 (12/20 0310) BP: (102-160)/(61-117) 116/71 (12/20 0310) SpO2:  [97 %-100 %] 97 % (12/20 0310)  Physical Exam:  General: alert, cooperative and no distress Lochia: appropriate Uterine Fundus: firm Incision: none DVT Evaluation: No evidence of DVT seen on physical exam.  Recent Labs    12/05/19 1000 12/07/19 0701  HGB 12.1 10.3*  HCT 34.7* 29.2*    Assessment/Plan: Lactation consult  Monitor for s/sx anemia Contraception discussion prior to discharge Infants in SCN   LOS: 2 days   [place] 12/07/2019, 1:21 PM

## 2019-12-07 NOTE — Lactation Note (Addendum)
This note was copied from a baby's chart. Lactation Consultation Note  Patient Name: Amanda [name] Girl Today's Date: 12/07/2019   Mom was late to pump initially because through the night she expressed she was too tired.  Mom spent all morning in SCN with the twins.  Since mom initiated pumping she has been willing to try and pump around every 3 hours.  Symphony DEBP set up in room with instructions in massaging, hand expressing, pumping, collection, storage, cleaning, labeling, handling and transporting expressed milk.  Coconut oil given with instructions in use.  Mom has a Lansinoh pump at home.  Lactation name and number written on white board and encouraged to call with any questions, concerns or assistance.  Maternal Data    Feeding Feeding Type: Donor Breast Milk  LATCH Score                   Interventions    Lactation Tools Discussed/Used Tools: 64F feeding tube / Syringe;Pump   Consult Status      Jarold Motto 12/07/2019, 5:38 PM

## 2019-12-07 NOTE — Plan of Care (Signed)
  Problem: Education: Goal: Knowledge of condition will improve Outcome: Progressing Goal: Individualized Educational Video(s) Outcome: Progressing Goal: Individualized Newborn Educational Video(s) Outcome: Progressing   Problem: Activity: Goal: Will verbalize the importance of balancing activity with adequate rest periods Outcome: Progressing Goal: Ability to tolerate increased activity will improve Outcome: Progressing   Problem: Coping: Goal: Ability to identify and utilize available resources and services will improve Outcome: Progressing   Problem: Life Cycle: Goal: Chance of risk for complications during the postpartum period will decrease Outcome: Progressing   Problem: Role Relationship: Goal: Ability to demonstrate positive interaction with newborn will improve Outcome: Progressing   Problem: Skin Integrity: Goal: Demonstration of wound healing without infection will improve Outcome: Progressing   

## 2019-12-08 ENCOUNTER — Encounter: Payer: Medicaid Other | Admitting: Obstetrics and Gynecology

## 2019-12-08 DIAGNOSIS — Z13220 Encounter for screening for lipoid disorders: Secondary | ICD-10-CM

## 2019-12-08 MED ORDER — BREAST MILK/FORMULA (FOR LABEL PRINTING ONLY): ORAL | Status: DC

## 2019-12-08 MED ORDER — NORETHINDRONE 0.35 MG PO TABS: 1.0000 | ORAL_TABLET | Freq: Every day | ORAL | 4 refills | Status: DC

## 2019-12-08 NOTE — Progress Notes (Signed)
DC to home.  To car after visiting babies in SCN.

## 2019-12-08 NOTE — Progress Notes (Signed)
DC inst reviewed with pt. Verb u/o of care for self at home.  Verb u/o of pumping and  Milk labels will be given to [facility] nurse when mom returns tonight.

## 2019-12-08 NOTE — Lactation Note (Signed)
This note was copied from a baby's chart. Lactation Consultation Note  Patient Name: Amanda [name] Girl Today's Date: 12/08/2019   [facility] spoke with mom before her discharge today. Twin girls remain in SCN, born at 23 weeks. Mom has been pumping every 3 hours, starting to get some milk expressed, smaller volumes. Twins are given donor breast milk. When in SCN following up with mom, SCN RN let me know they just finished a lick and learn with Kirke Shaggy, with a successful latch and 3-4 good sucks, before falling asleep.  Mom has her own DEBP for when she is home, reiterated the importance of frequent breast stimulation and milk removal, sticking to every 3 hour schedule. Also discussed with mom pumping bedside when here to visit her twins, to help encourage onset of adequate milk production, and access to [facility] support anytime she is here visiting, and pursing lick and learns with the twins. Outpatient LC number given for questions while at home, as well as community breastfeeding resources and support.  Maternal Data    Feeding Feeding Type: Donor Breast Milk  LATCH Score                   Interventions    Lactation Tools Discussed/Used Tools: 78F feeding tube / Syringe   Consult Status      Amanda [name] 12/08/2019, 12:09 PM

## 2019-12-10 LAB — SURGICAL PATHOLOGY

## 2019-12-11 ENCOUNTER — Ambulatory Visit: Payer: Self-pay

## 2019-12-11 NOTE — Lactation Note (Signed)
This note was copied from a baby's chart. Lactation Consultation Note  Patient Name: Amanda [name] [id number] Date: 12/11/2019 Reason for consult: Initial assessment   Maternal Data    Feeding Feeding Type: Breast Fed  LATCH Score Latch: Grasps breast easily, tongue down, lips flanged, rhythmical sucking.  Audible Swallowing: A few with stimulation  Type of Nipple: Everted at rest and after stimulation  Comfort (Breast/Nipple): Soft / non-tender  Hold (Positioning): Assistance needed to correctly position infant at breast and maintain latch.  LATCH Score: 8  Interventions Interventions: Assisted with latch;Adjust position;Support pillows  Lactation Tools Discussed/Used     Consult Status  LC assisted with latch and position of infant for breastfeeding. Mother is comfortable with positioning infant and is using the football hold. Mother's milk has increased in volume and infant was able to latch on to the breast. Infant gets sleepy and mother was educated to stumulate by rubbing cheeks but also pacing infant.     Elvera Lennox [id number], 12:43 PM

## 2019-12-12 ENCOUNTER — Ambulatory Visit: Payer: Self-pay

## 2019-12-12 NOTE — Lactation Note (Signed)
This note was copied from a baby's chart. Lactation Consultation Note  Patient Name: Amanda [name] [id number] Date: 12/12/2019   Spoke with twins' parents today.  Mom reports putting babies skin to skin when visiting. Lick and learn sessions going well with them latching and taking some strong sucks.  Mom reports trying to pump every 3 hours.  She was getting 15 to 20 ml per breast, but today when visiting she expressed 35 ml from each breast.  Mom still confident that her Lansinoh pump is working well for her at home. Kirke Shaggy is getting 37 ml and Drema Pry is getting 41 ml of breast milk or DBM via bottle or tube/gavage.  Praised mom for her commitment to pump and put twins to the breast.  Encouraged mom to call lactation with any questions, concerns or assistance.  Maternal Data    Feeding Feeding Type: Breast Milk Nipple Type: Extra Slow Flow  LATCH Score                   Interventions    Lactation Tools Discussed/Used Tools: Bottle;7F feeding tube / Syringe;Pump   Consult Status      Jarold Motto 12/12/2019, 5:00 PM

## 2019-12-16 ENCOUNTER — Ambulatory Visit: Payer: Self-pay

## 2019-12-16 NOTE — Lactation Note (Signed)
This note was copied from a baby's chart. Lactation Consultation Note  Patient Name: Amanda [name] [id number] Date: 12/16/2019 Reason for consult: Follow-up assessment  LC called in to assist mom with breastfeeding of Twins A and B. Twin A took several attempts, and repositioning in football before baby was eager and sustained latch. Once latched, baby did well with strong deep rhythmic sucking and spontaneous swallowing throughout duration of 5-6 minutes at breast; once release milk evident around the mouth and baby appearing content.  Mom was able to independently get Twin B to breast in football hold on opposite breast, LC did adjust position slightly pulling baby back where nose was opposite of nipple as to not compromise the neck. Baby grasped breast easily and sustained latch with strong rhythmic sucking and audible swallows.  Mom reports pumping to still be going well, no pain/discomfort, and pumping routinely. Encouraged to call out for [facility] support as needed.  Maternal Data    Feeding Feeding Type: Breast Fed  LATCH Score Latch: Repeated attempts needed to sustain latch, nipple held in mouth throughout feeding, stimulation needed to elicit sucking reflex.  Audible Swallowing: Spontaneous and intermittent  Type of Nipple: Everted at rest and after stimulation  Comfort (Breast/Nipple): Soft / non-tender  Hold (Positioning): No assistance needed to correctly position infant at breast.  LATCH Score: 9  Interventions Interventions: Breast feeding basics reviewed;Assisted with latch;Support pillows  Lactation Tools Discussed/Used     Consult Status Consult Status: PRN Date: 12/16/19 Follow-up type: Call as needed    Lavonia Drafts 12/16/2019, 11:59 AM

## 2019-12-19 ENCOUNTER — Ambulatory Visit: Payer: Self-pay

## 2019-12-19 NOTE — Lactation Note (Signed)
This note was copied from a baby's chart. Lactation Consultation Note  Patient Name: Randi Girl A Semo Today's Date: 12/19/2019     Maternal Data  Mom nursing baby A on left breast, latches easily per mom Pumping is going well for mom, good supply per mom Feeding Feeding Type: Breast Fed Nursing well, approx. 10 min.  LATCH Score Latch: Grasps breast easily, tongue down, lips flanged, rhythmical sucking.  Audible Swallowing: Spontaneous and intermittent  Type of Nipple: Everted at rest and after stimulation  Comfort (Breast/Nipple): Soft / non-tender  Hold (Positioning): No assistance needed to correctly position infant at breast.  LATCH Score: 10  Interventions    Lactation Tools Discussed/Used Tools: Bottle;55F feeding tube / Syringe;Pump   Consult Status      Ferol Luz 12/19/2019, 5:43 PM

## 2020-01-14 ENCOUNTER — Encounter: Payer: Self-pay | Admitting: Obstetrics & Gynecology

## 2020-01-14 ENCOUNTER — Ambulatory Visit (INDEPENDENT_AMBULATORY_CARE_PROVIDER_SITE_OTHER): Payer: Medicaid Other | Admitting: Obstetrics & Gynecology

## 2020-01-14 ENCOUNTER — Other Ambulatory Visit: Payer: Self-pay

## 2020-01-14 DIAGNOSIS — Z1332 Encounter for screening for maternal depression: Secondary | ICD-10-CM

## 2020-01-14 MED ORDER — DROSPIRENONE-ETHINYL ESTRADIOL 3-0.02 MG PO TABS: 1.0000 | ORAL_TABLET | Freq: Every day | ORAL | 11 refills | Status: DC

## 2020-01-14 MED ORDER — SERTRALINE HCL 50 MG PO TABS: 50.0000 mg | ORAL_TABLET | Freq: Every day | ORAL | 6 refills | Status: DC

## 2020-01-14 NOTE — Progress Notes (Signed)
  OBSTETRICS POSTPARTUM CLINIC PROGRESS NOTE  Subjective:     Amanda [name] is a 24 y.o. [id number] female who presents for a postpartum visit. She is 6 weeks postpartum following a Preterm pregnancy <37 weeks or Multifetal pregnancy and delivery by Vaginal, no problems at delivery.  I have fully reviewed the prenatal and intrapartum course. Anesthesia: epidural.  Postpartum course has been complicated by uncomplicated.  Baby is feeding by Bottle.  Bleeding: patient has not  resumed menses.  Bowel function is normal. Bladder function is normal.  Patient is not sexually active. Contraception method desired is oral progesterone-only contraceptive.  Postpartum depression screening: positive. Edinburgh 19.  The following portions of the patient's history were reviewed and updated as appropriate: allergies, current medications, past family history, past medical history, past social history, past surgical history and problem list.  Review of Systems Pertinent items are noted in HPI.  Objective:    BP 120/80   Ht 5\' 4"  (1.626 m)   Wt 136 lb (61.7 kg)   LMP 04/04/2019 (Exact Date)   BMI 23.34 kg/m   General:  alert and no distress   Breasts:  inspection negative, no nipple discharge or bleeding, no masses or nodularity palpable  Lungs: clear to auscultation bilaterally  Heart:  regular rate and rhythm, S1, S2 normal, no murmur, click, rub or gallop  Abdomen: soft, non-tender; bowel sounds normal; no masses,  no organomegaly.     Vulva:  normal  Vagina: normal vagina, no discharge, exudate, lesion, or erythema  Cervix:  no cervical motion tenderness and no lesions  Corpus: normal size, contour, position, consistency, mobility, non-tender  Adnexa:  normal adnexa and no mass, fullness, tenderness  Rectal Exam: Not performed.          Assessment:  Post Partum Care visit 1. Postpartum care following vaginal delivery  Plan:  See orders and Patient Instructions Contraceptive counseling for  oral contraceptives (estrogen/progesterone) Resume all normal activities Follow up in: 6 months or as needed.    Zoloft for PPD, s/sx to watch for discussed Plan 6 mos therapy Will adjust dose as needed  Barnett Applebaum, MD, [facility], [facility] 01/14/2020  1:25 PM

## 2020-01-14 NOTE — Patient Instructions (Signed)
Sertraline tablets What is this medicine? SERTRALINE (SER tra leen) is used to treat depression. It may also be used to treat obsessive compulsive disorder, panic disorder, post-trauma stress, premenstrual dysphoric disorder (PMDD) or social anxiety. This medicine may be used for other purposes; ask your health care provider or pharmacist if you have questions. COMMON BRAND NAME(S): Zoloft What should I tell my health care provider before I take this medicine? They need to know if you have any of these conditions:  bleeding disorders  bipolar disorder or a family history of bipolar disorder  glaucoma  heart disease  high blood pressure  history of irregular heartbeat  history of low levels of calcium, magnesium, or potassium in the blood  if you often drink alcohol  liver disease  receiving electroconvulsive therapy  seizures  suicidal thoughts, plans, or attempt; a previous suicide attempt by you or a family member  take medicines that treat or prevent blood clots  thyroid disease  an unusual or allergic reaction to sertraline, other medicines, foods, dyes, or preservatives  pregnant or trying to get pregnant  breast-feeding How should I use this medicine? Take this medicine by mouth with a glass of water. Follow the directions on the prescription label. You can take it with or without food. Take your medicine at regular intervals. Do not take your medicine more often than directed. Do not stop taking this medicine suddenly except upon the advice of your doctor. Stopping this medicine too quickly may cause serious side effects or your condition may worsen. A special MedGuide will be given to you by the pharmacist with each prescription and refill. Be sure to read this information carefully each time. Talk to your pediatrician regarding the use of this medicine in children. While this drug may be prescribed for children as young as 7 years for selected conditions,  precautions do apply. Overdosage: If you think you have taken too much of this medicine contact a poison control center or emergency room at once. NOTE: This medicine is only for you. Do not share this medicine with others. What if I miss a dose? If you miss a dose, take it as soon as you can. If it is almost time for your next dose, take only that dose. Do not take double or extra doses. What may interact with this medicine? Do not take this medicine with any of the following medications:  cisapride  dronedarone  linezolid  MAOIs like Carbex, Eldepryl, Marplan, Nardil, and Parnate  methylene blue (injected into a vein)  pimozide  thioridazine This medicine may also interact with the following medications:  alcohol  amphetamines  aspirin and aspirin-like medicines  certain medicines for depression, anxiety, or psychotic disturbances  certain medicines for fungal infections like ketoconazole, fluconazole, posaconazole, and itraconazole  certain medicines for irregular heart beat like flecainide, quinidine, propafenone  certain medicines for migraine headaches like almotriptan, eletriptan, frovatriptan, naratriptan, rizatriptan, sumatriptan, zolmitriptan  certain medicines for sleep  certain medicines for seizures like carbamazepine, valproic acid, phenytoin  certain medicines that treat or prevent blood clots like warfarin, enoxaparin, dalteparin  cimetidine  digoxin  diuretics  fentanyl  isoniazid  lithium  NSAIDs, medicines for pain and inflammation, like ibuprofen or naproxen  other medicines that prolong the QT interval (cause an abnormal heart rhythm) like dofetilide  rasagiline  safinamide  supplements like St. John's wort, kava kava, valerian  tolbutamide  tramadol  tryptophan This list may not describe all possible interactions. Give your health care provider  a list of all the medicines, herbs, non-prescription drugs, or dietary supplements  you use. Also tell them if you smoke, drink alcohol, or use illegal drugs. Some items may interact with your medicine. What should I watch for while using this medicine? Tell your doctor if your symptoms do not get better or if they get worse. Visit your doctor or health care professional for regular checks on your progress. Because it may take several weeks to see the full effects of this medicine, it is important to continue your treatment as prescribed by your doctor. Patients and their families should watch out for new or worsening thoughts of suicide or depression. Also watch out for sudden changes in feelings such as feeling anxious, agitated, panicky, irritable, hostile, aggressive, impulsive, severely restless, overly excited and hyperactive, or not being able to sleep. If this happens, especially at the beginning of treatment or after a change in dose, call your health care professional. You may get drowsy or dizzy. Do not drive, use machinery, or do anything that needs mental alertness until you know how this medicine affects you. Do not stand or sit up quickly, especially if you are an older patient. This reduces the risk of dizzy or fainting spells. Alcohol may interfere with the effect of this medicine. Avoid alcoholic drinks. Your mouth may get dry. Chewing sugarless gum or sucking hard candy, and drinking plenty of water may help. Contact your doctor if the problem does not go away or is severe. What side effects may I notice from receiving this medicine? Side effects that you should report to your doctor or health care professional as soon as possible:  allergic reactions like skin rash, itching or hives, swelling of the face, lips, or tongue  anxious  black, tarry stools  changes in vision  confusion  elevated mood, decreased need for sleep, racing thoughts, impulsive behavior  eye pain  fast, irregular heartbeat  feeling faint or lightheaded, falls  feeling agitated,  angry, or irritable  hallucination, loss of contact with reality  loss of balance or coordination  loss of memory  painful or prolonged erections  restlessness, pacing, inability to keep still  seizures  stiff muscles  suicidal thoughts or other mood changes  trouble sleeping  unusual bleeding or bruising  unusually weak or tired  vomiting Side effects that usually do not require medical attention (report to your doctor or health care professional if they continue or are bothersome):  change in appetite or weight  change in sex drive or performance  diarrhea  increased sweating  indigestion, nausea  tremors This list may not describe all possible side effects. Call your doctor for medical advice about side effects. You may report side effects to FDA at 1-800-FDA-1088. Where should I keep my medicine? Keep out of the reach of children. Store at room temperature between 15 and 30 degrees C (59 and 86 degrees F). Throw away any unused medicine after the expiration date. NOTE: This sheet is a summary. It may not cover all possible information. If you have questions about this medicine, talk to your doctor, pharmacist, or health care provider.  2020 Elsevier/Gold Standard (2018-11-26 10:09:27)  

## 2020-01-27 NOTE — Telephone Encounter (Signed)
Please advise 

## 2020-02-09 ENCOUNTER — Other Ambulatory Visit: Payer: Self-pay | Admitting: Obstetrics & Gynecology

## 2020-02-09 DIAGNOSIS — N939 Abnormal uterine and vaginal bleeding, unspecified: Secondary | ICD-10-CM

## 2020-02-09 NOTE — Telephone Encounter (Signed)
[facility] [place] and f/u PH

## 2020-02-09 NOTE — Telephone Encounter (Signed)
Patient is schedule for 03/11/20 at 1:30 for ultrasound and 2:30 with [facility]

## 2020-02-12 ENCOUNTER — Ambulatory Visit (INDEPENDENT_AMBULATORY_CARE_PROVIDER_SITE_OTHER): Payer: Medicaid Other

## 2020-02-12 ENCOUNTER — Other Ambulatory Visit: Payer: Self-pay

## 2020-02-12 ENCOUNTER — Ambulatory Visit (INDEPENDENT_AMBULATORY_CARE_PROVIDER_SITE_OTHER): Payer: Medicaid Other | Admitting: Obstetrics & Gynecology

## 2020-02-12 ENCOUNTER — Encounter: Payer: Self-pay | Admitting: Obstetrics & Gynecology

## 2020-02-12 VITALS — BP 120/80 | Ht 64.0 in | Wt 137.0 lb

## 2020-02-12 DIAGNOSIS — N939 Abnormal uterine and vaginal bleeding, unspecified: Secondary | ICD-10-CM

## 2020-02-12 DIAGNOSIS — N938 Other specified abnormal uterine and vaginal bleeding: Secondary | ICD-10-CM

## 2020-02-12 NOTE — Progress Notes (Signed)
  HPI: Dysfunctional Uterine Bleeding Patient complains of irregular menses. She had been bleeding irregularly since delivery of twins and adjusting to OCPs. She has tried restarting pills and different types, and finally feels she is stopping today. She did pass what looked like to her tissue a week ago (small strand).  She is on week 2 of new pack of pills. Ultrasound demonstrates no masses seen, scant amt of fluid in endometrial canal. These findings are Pelvis normal  PMHx: She  has a past medical history of Family history of adverse reaction to anesthesia, GERD (gastroesophageal reflux disease), and Migraine. Also,  has a past surgical history that includes No past surgeries and Wisdom tooth extraction., family history includes Asthma in her maternal grandmother; Breast cancer (age of onset: 74) in her maternal aunt; Diabetes in her paternal grandfather and paternal grandmother; Hypertension in her maternal grandfather, maternal grandmother, paternal grandfather, and paternal grandmother; Migraines in her mother; Pancreatic cancer (age of onset: 42) in her maternal grandfather; Skin cancer in her father; Thyroid disease in her maternal aunt and maternal grandmother.,  reports that she has never smoked. She has never used smokeless tobacco. She reports that she does not drink alcohol or use drugs.  She has a current medication list which includes the following prescription(s): acetaminophen, drospirenone-ethinyl estradiol, fiber, one a day prenatal, and sertraline. Also, is allergic to imitrex [sumatriptan] and penicillins.  Review of Systems  All other systems reviewed and are negative.   Objective: There were no vitals taken for this visit.  Physical examination Constitutional NAD, Conversant  Skin No rashes, lesions or ulceration.   Extremities: Moves all appropriately.  Normal ROM for age. No lymphadenopathy.  Neuro: Grossly intact  Psych: Oriented to PPT.  Normal mood. Normal affect.     US PELVIC COMPLETE WITH TRANSVAGINAL  Result Date: 02/12/2020 Patient Name: Amanda [name] DOB: 07/21/1996 MRN: [id number] ULTRASOUND REPORT Location: [facility] Date of Service: 02/12/2020 Indications:Abnormal Uterine Bleeding Findings: The uterus is anteverted and measures 7.7 x 6.9 x 4.1 cm. Echo texture is homogenous without evidence of focal masses. The Endometrium measures 2.6 mm. There is 3.2 mm AP of fluid throughout the endometrial canal. Right Ovary measures 3.4 x 2.0 x 1.6  cm. It is normal in appearance. Left Ovary measures 2.7 x 1.9 x 1.3 cm. It is normal in appearance. Survey of the adnexa demonstrates no adnexal masses. There is no free fluid in the cul de sac. Impression: 1. Normal pelvic ultrasound. Recommendations: 1.Clinical correlation with the patient's History and Physical Exam. Gweneth Dimitri, RT Review of ULTRASOUND.    I have personally reviewed images and report of recent ultrasound done at [facility].    Plan of management to be discussed with patient. Barnett Applebaum, MD, [facility], [facility] 02/12/2020  2:02 PM   Assessment:  Abnormal uterine bleeding (AUB) Cont OCPs per regular use.  No need for D&C or other evaluations.  Expect norma; pattern for menses again soon. Options for surgery, other prgesterone meds, change in pill discussed.  A total of 20 minutes were spent face-to-face with the patient as well as preparation, review, communication, and documentation during this encounter.   Barnett Applebaum, MD, [facility], [facility] 02/12/2020  2:14 PM

## 2020-06-02 ENCOUNTER — Ambulatory Visit: Payer: Medicaid Other | Admitting: Adult Health

## 2020-06-04 ENCOUNTER — Ambulatory Visit: Payer: Medicaid Other | Admitting: Adult Health

## 2020-06-17 DIAGNOSIS — J019 Acute sinusitis, unspecified: Secondary | ICD-10-CM | POA: Diagnosis not present

## 2020-06-25 NOTE — Progress Notes (Signed)
New patient visit   Patient: Amanda [name]   DOB: September 30, 1996   24 y.o. Female  MRN: [id number] Visit Date: 06/28/2020  Today's healthcare provider: Marcille Buffy, FNP   Chief Complaint  Patient presents with  . [place] as a scribe for [place], FNP.,have documented all relevant documentation on the behalf of Marcille Buffy, FNP,as directed by  Marcille Buffy, FNP while in the presence of Marcille Buffy, [place].  Subjective    Amanda [name] is a 24 y.o. female who presents today as a new patient to establish care.  HPI  Patient would like to discuss migraine headaches. She states she was diagnosed as a teenager. She previously was being treated by Neurology.  She has twins, She 12/06/2019 vaginal birth. Not breastfeeding.  She had a MRI on 01/2017 was normal.    No LMP recorded. LMP 05/27/2020.   She has had worsening of headaches, tylenol, Excedrin does not help her. She feels like her migraines got worse after child birth. She has some nausea with them. amitriptyline daily (25 mg at bedtime) as well as Maxalt and Zofran at the onset of a headache. It looks like you should have refills on the amitriptyline and the nausea medicine (Zofran). Saw Dr. Jaynee Eagles at [facility] in the past.  She reports daily headaches, drinking plenty of water. No sodas. Made diet changes.   She use to have black out spells and that is why she got the MRI 2018. She knows she needs new glasses no new eye exam.    Multiple sinus infections. Most recently treated with doxycycline in past week. Doxycycline not helping. Posterior lymph node she reports still enlarged but better than when she had scan done previously. She describes frontal and sinus pressure over eyes. Mildly enlarged tonsils. History of recurrent strep infections.   She has also had a lump on the bottom of her neck that had a CT scan done . Ultrasound she  reports  was also done. Was most likely benign lymph node in past.   Patient  denies any fever, body aches,chills, rash, chest pain, shortness of breath, vomiting, or diarrhea.   Denies dizziness, lightheadedness, pre syncopal or syncopal episodes.     Past Medical History:  Diagnosis Date  . Family history of adverse reaction to anesthesia    heart condition  . GERD (gastroesophageal reflux disease)   . Migraine    Past Surgical History:  Procedure Laterality Date  . NO PAST SURGERIES    . WISDOM TOOTH EXTRACTION     Family Status  Relation Name Status  . Mother  Alive  . Father  Alive  . MGM  Alive  . MGF  Alive  . PGM  Alive  . PGF  Alive  . Mat Aunt  (Not Specified)  . Brother  Alive  . Daughter  Alive  . Daughter  Alive   Family History  Problem Relation Age of Onset  . Migraines Mother   . Skin cancer Father   . Asthma Maternal Grandmother   . Hypertension Maternal Grandmother   . Thyroid disease Maternal Grandmother   . Hypertension Maternal Grandfather   . Pancreatic cancer Maternal Grandfather 69  . Hypertension Paternal Grandmother   . Diabetes Paternal Grandmother   . Hypertension Paternal Grandfather   . Diabetes Paternal Grandfather   . Thyroid disease Maternal Aunt   . Breast cancer Maternal Aunt 75  . Allergies Brother  Social History   Socioeconomic History  . Marital status: Significant Other    Spouse name: [name]  . Number of children: Not on file  . Years of education: Not on file  . Highest education level: Not on file  Occupational History  . Occupation: Massage Therapy  Tobacco Use  . Smoking status: Never Smoker  . Smokeless tobacco: Never Used  Vaping Use  . Vaping Use: Never used  Substance and Sexual Activity  . Alcohol use: Never  . Drug use: No  . Sexual activity: Yes  Other Topics Concern  . Not on file  Social History Narrative      Right-handed   Caffeine: occasional coffee   Social Determinants of  Health   Financial Resource Strain:   . Difficulty of Paying Living Expenses:   Food Insecurity:   . Worried About Charity fundraiser in the Last Year:   . Arboriculturist in the Last Year:   Transportation Needs:   . Film/video editor (Medical):   Marland Kitchen Lack of Transportation (Non-Medical):   Physical Activity:   . Days of Exercise per Week:   . Minutes of Exercise per Session:   Stress:   . Feeling of Stress :   Social Connections:   . Frequency of Communication with Friends and Family:   . Frequency of Social Gatherings with Friends and Family:   . Attends Religious Services:   . Active Member of Clubs or Organizations:   . Attends Archivist Meetings:   Marland Kitchen Marital Status:    Outpatient Medications Prior to Visit  Medication Sig  . acetaminophen (TYLENOL) 500 MG tablet Take 1,000 mg by mouth every 8 (eight) hours as needed for headache.   Marland Kitchen FIBER ADULT GUMMIES PO Take 2-3 capsules by mouth daily as needed (constipation).  . sertraline (ZOLOFT) 50 MG tablet Take 1 tablet (50 mg total) by mouth daily.  . [DISCONTINUED] drospirenone-ethinyl estradiol (GIANVI) 3-0.02 MG tablet Take 1 tablet by mouth daily.  . [DISCONTINUED] Prenatal MV & Min w/FA-DHA (ONE A DAY PRENATAL) 0.4-25 MG CHEW Chew 1 tablet by mouth every evening.    No facility-administered medications prior to visit.   Allergies  Allergen Reactions  . Imitrex [Sumatriptan] Swelling    Facial swelling and burning lips  . Penicillins Hives    Did it involve swelling of the face/tongue/throat, SOB, or low BP? No Did it involve sudden or severe rash/hives, skin peeling, or any reaction on the inside of your mouth or nose? Yes Did you need to seek medical attention at a hospital or doctor's office? Yes When did it last happen?childhood allergy If all above answers are "NO", may proceed with cephalosporin use.    Immunization History  Administered Date(s) Administered  . DTaP 04/15/1998, 06/10/1998,  08/12/1998, 02/17/1999, 05/15/2003  . HPV Quadrivalent 09/23/2009, 11/24/2009, 09/27/2010  . Hepatitis A 08/20/2007, 08/28/2008  . Hepatitis B 04/15/1998, 06/10/1998, 07/21/1999  . HiB (PRP-OMP) 04/15/1998, 06/10/1998, 07/21/1999  . IPV 04/15/1998, 06/10/1998, 02/17/1999, 05/15/2003  . Influenza Split 12/31/2005, 10/27/2008, 09/06/2012  . Influenza Whole 09/30/2009  . Influenza,inj,Quad PF,6+ Mos 09/11/2013, 09/04/2014, 08/20/2015, 08/29/2016, 10/04/2017, 08/20/2018, 08/13/2019  . Influenza-Unspecified 09/06/2012  . MMR 07/21/1999, 05/15/2003  . Meningococcal Conjugate 08/20/2015  . Meningococcal Polysaccharide 09/23/2009  . PPD Test 03/13/2016, 03/20/2016  . Td 08/28/2008  . Tdap 08/28/2008, 08/22/2018, 10/21/2019  . Varicella 02/17/1999, 08/20/2007    Health Maintenance  Topic Date Due  . Hepatitis C Screening  Never done  .  COVID-19 Vaccine (1) Never done  . INFLUENZA VACCINE  07/18/2020  . PAP-Cervical Cytology Screening  05/19/2022  . PAP SMEAR-Modifier  05/19/2022  . TETANUS/TDAP  10/20/2029  . HIV Screening  Completed    Patient Care Team: Shresta Risden, [name], FNP as PCP - General (Family Medicine)  Review of Systems  Constitutional: Negative.   HENT: Positive for congestion, postnasal drip and sinus pressure.   Eyes: Positive for photophobia (with migraines. ).  Respiratory: Negative.   Cardiovascular: Negative.   Gastrointestinal: Positive for nausea. Negative for abdominal distention, abdominal pain, anal bleeding, blood in stool, constipation, diarrhea, rectal pain and vomiting.  Endocrine: Negative.   Genitourinary: Negative.   Musculoskeletal: Negative.   Skin: Negative.   Allergic/Immunologic: Negative.   Neurological: Positive for headaches.  Hematological: Negative.   Psychiatric/Behavioral: Negative.       Objective    BP 104/65 (BP Location: Right Arm, Patient Position: Sitting, Cuff Size: Normal)   Pulse (!) 101   Temp (!) 97.5 F (36.4 C)  (Temporal)   Ht 5' 4"  (1.626 m)   Wt 147 lb 6.4 oz (66.9 kg)   BMI 25.30 kg/m  Physical Exam Constitutional:      Appearance: Normal appearance.  HENT:     Head: Normocephalic and atraumatic.     Right Ear: Tympanic membrane, ear canal and external ear normal. There is no impacted cerumen.     Left Ear: Tympanic membrane, ear canal and external ear normal. There is no impacted cerumen.     Nose: Rhinorrhea present.     Right Turbinates: Swollen.     Left Turbinates: Swollen.     Mouth/Throat:     Mouth: Mucous membranes are moist.     Tongue: No lesions. Tongue does not deviate from midline.     Palate: No mass and lesions.     Pharynx: Oropharynx is clear. Uvula midline.     Tonsils: No tonsillar abscesses. 2+ on the right. 2+ on the left.  Eyes:     General: No scleral icterus.       Right eye: No discharge.        Left eye: No discharge.     Conjunctiva/sclera: Conjunctivae normal.     Pupils: Pupils are equal, round, and reactive to light.  Cardiovascular:     Rate and Rhythm: Normal rate and regular rhythm.     Pulses: Normal pulses.     Heart sounds: Normal heart sounds. No murmur heard.  No friction rub. No gallop.   Pulmonary:     Effort: Pulmonary effort is normal.     Breath sounds: Normal breath sounds.  Abdominal:     General: Abdomen is flat. There is no distension.     Palpations: There is no mass.     Tenderness: There is no abdominal tenderness. There is no right CVA tenderness, left CVA tenderness, guarding or rebound.     Hernia: No hernia is present.  Skin:    General: Skin is warm and dry.     Capillary Refill: Capillary refill takes less than 2 seconds.  Neurological:     General: No focal deficit present.     Mental Status: She is alert and oriented to person, place, and time. Mental status is at baseline.     Cranial Nerves: No cranial nerve deficit.     Sensory: No sensory deficit.     Motor: No weakness.     Coordination: Coordination normal.  Gait: Gait normal.     Deep Tendon Reflexes: Reflexes normal.  Psychiatric:        Mood and Affect: Mood normal.        Behavior: Behavior normal.        Thought Content: Thought content normal.        Judgment: Judgment normal.       Depression Screen PHQ 2/9 Scores 06/28/2020 12/01/2019 11/24/2019 11/17/2019  PHQ - 2 Score 0 0 0 0   Results for orders placed or performed in visit on 06/28/20  POCT Urinalysis Dipstick  Result Value Ref Range   Color, UA dark yellow    Clarity, UA clear    Glucose, UA Negative Negative   Bilirubin, UA negative    Ketones, UA negative    Spec Grav, UA >=1.030 (A) 1.010 - 1.025   Blood, UA negative    pH, UA 6.0 5.0 - 8.0   Protein, UA Negative Negative   Urobilinogen, UA 0.2 0.2 or 1.0 E.U./dL   Nitrite, UA negative    Leukocytes, UA Negative Negative   Appearance     Odor      Assessment & Plan       The primary encounter diagnosis was Chronic migraine without aura with status migrainosus, not intractable. Diagnoses of Vitamin D deficiency, Screening cholesterol level, Sinus pressure, History of chronic sinusitis, and Enlarged tonsils were also pertinent to this visit. Return in about 1 month (around 07/29/2020), or if symptoms worsen or fail to improve, for at any time for any worsening symptoms, Go to Emergency room/ urgent care if worse.       Meds ordered this encounter  Medications  . ondansetron (ZOFRAN) 4 MG tablet    Sig: Take 1 tablet (4 mg total) by mouth every 8 (eight) hours as needed for nausea or vomiting.    Dispense:  20 tablet    Refill:  0  . predniSONE (STERAPRED UNI-PAK 21 TAB) 10 MG (21) TBPK tablet    Sig: PO: Take 6 tablets on day 1:Take 5 tablets day 2:Take 4 tablets day 3: Take 3 tablets day 4:Take 2 tablets day five: 5 Take 1 tablet day 6    Dispense:  21 tablet    Refill:  0  . cetirizine (ZYRTEC) 10 MG tablet    Sig: Take 1 tablet (10 mg total) by mouth daily.    Dispense:  30 tablet    Refill:  1   . fluticasone (FLONASE) 50 MCG/ACT nasal spray    Sig: Place 2 sprays into both nostrils daily.    Dispense:  16 g    Refill:  2   Suspect sinus pressure frontal, not taking Zyrtec or flonase advised to start. Currently on Doxycycline- finish - gave Steroid dose pack.   8 tablets of Nurtec Provided see sample book to try if not cleared with sinusitis treatment. Also recommended nasal saline rinse.   Red Flags discussed. The patient was given clear instructions to go to ER or return to medical center if any red flags develop, symptoms do not improve, worsen or new problems develop. They verbalized understanding.  Fasting labs.    Orders Placed This Encounter  Procedures  . TSH  . Comprehensive Metabolic Panel (CMET)  . CBC with Differential/Platelet  . Lipid Panel w/o Chol/HDL Ratio  . VITAMIN D 25 Hydroxy (Vit-D Deficiency, Fractures)  . Ambulatory referral to Neurology    Referral Priority:   Routine    Referral Type:  Consultation    Referral Reason:   Specialty Services Required    Requested Specialty:   Neurology    Number of Visits Requested:   1  . Ambulatory referral to ENT    Referral Priority:   Routine    Referral Type:   Consultation    Referral Reason:   Specialty Services Required    Requested Specialty:   Otolaryngology    Number of Visits Requested:   1  . POCT Urinalysis Dipstick     Advised patient call the office or your primary care doctor for an appointment if no improvement within 72 hours or if any symptoms change or worsen at any time  Advised ER or urgent Care if after hours or on weekend. Call 911 for emergency symptoms at any time.Patinet verbalized understanding of all instructions given/reviewed and treatment plan and has no further questions or concerns at this time.    CPE to schedule. Follow up if not improving. Should hear from referrals within 2 weeks call if no [place]  call.  IWellington Hampshire Rebel Willcutt, FNP, have reviewed all documentation for  this visit. The documentation on 06/28/20 for the exam, diagnosis, procedures, and orders are all accurate and complete.   Marcille Buffy, [place] [phone] (phone) [phone] (fax)  [place]

## 2020-06-28 ENCOUNTER — Encounter: Payer: Self-pay | Admitting: Adult Health

## 2020-06-28 ENCOUNTER — Ambulatory Visit (INDEPENDENT_AMBULATORY_CARE_PROVIDER_SITE_OTHER): Payer: Medicaid Other | Admitting: Adult Health

## 2020-06-28 ENCOUNTER — Other Ambulatory Visit: Payer: Self-pay

## 2020-06-28 VITALS — BP 104/65 | HR 101 | Temp 97.5°F | Ht 64.0 in | Wt 147.4 lb

## 2020-06-28 DIAGNOSIS — G43701 Chronic migraine without aura, not intractable, with status migrainosus: Secondary | ICD-10-CM | POA: Diagnosis not present

## 2020-06-28 DIAGNOSIS — Z13220 Encounter for screening for lipoid disorders: Secondary | ICD-10-CM | POA: Diagnosis not present

## 2020-06-28 DIAGNOSIS — J351 Hypertrophy of tonsils: Secondary | ICD-10-CM

## 2020-06-28 DIAGNOSIS — Z8709 Personal history of other diseases of the respiratory system: Secondary | ICD-10-CM | POA: Diagnosis not present

## 2020-06-28 DIAGNOSIS — E559 Vitamin D deficiency, unspecified: Secondary | ICD-10-CM | POA: Insufficient documentation

## 2020-06-28 DIAGNOSIS — J3489 Other specified disorders of nose and nasal sinuses: Secondary | ICD-10-CM

## 2020-06-28 LAB — POCT URINALYSIS DIPSTICK
Bilirubin, UA: NEGATIVE
Blood, UA: NEGATIVE
Glucose, UA: NEGATIVE
Ketones, UA: NEGATIVE
Leukocytes, UA: NEGATIVE
Nitrite, UA: NEGATIVE
Protein, UA: NEGATIVE
Spec Grav, UA: >=1.03 — AB (ref 1.010–1.025)
Urobilinogen, UA: 0.2 E.U./dL
pH, UA: 6 (ref 5.0–8.0)

## 2020-06-28 MED ORDER — FLUTICASONE PROPIONATE 50 MCG/ACT NA SUSP: 2.0000 | Freq: Every day | NASAL | 2 refills | Status: DC

## 2020-06-28 MED ORDER — PREDNISONE 10 MG (21) PO TBPK: ORAL_TABLET | ORAL | 0 refills | Status: DC

## 2020-06-28 MED ORDER — ONDANSETRON HCL 4 MG PO TABS: 4.0000 mg | ORAL_TABLET | Freq: Three times a day (TID) | ORAL | 0 refills | Status: DC | PRN

## 2020-06-28 MED ORDER — CETIRIZINE HCL 10 MG PO TABS: 10.0000 mg | ORAL_TABLET | Freq: Every day | ORAL | 1 refills | Status: DC

## 2020-06-28 NOTE — Patient Instructions (Signed)
Complete Doxycycline. Try Prednisone dose pack. Zofran for headaches if nauseated. May consider Nurtec if not clearing  with treatment for sinusitis.  Start Zyrtec 10 mg once by mouth daily and Flonase nasal spray as directed.     Health Maintenance, Female Adopting a healthy lifestyle and getting preventive care are important in promoting health and wellness. Ask your health care provider about:  The right schedule for you to have regular tests and exams.  Things you can do on your own to prevent diseases and keep yourself healthy. What should I know about diet, weight, and exercise? Eat a healthy diet   Eat a diet that includes plenty of vegetables, fruits, low-fat dairy products, and lean protein.  Do not eat a lot of foods that are high in solid fats, added sugars, or sodium. Maintain a healthy weight Body mass index (BMI) is used to identify weight problems. It estimates body fat based on height and weight. Your health care provider can help determine your BMI and help you achieve or maintain a healthy weight. Get regular exercise Get regular exercise. This is one of the most important things you can do for your health. Most adults should:  Exercise for at least 150 minutes each week. The exercise should increase your heart rate and make you sweat (moderate-intensity exercise).  Do strengthening exercises at least twice a week. This is in addition to the moderate-intensity exercise.  Spend less time sitting. Even light physical activity can be beneficial. Watch cholesterol and blood lipids Have your blood tested for lipids and cholesterol at 22 years of age, then have this test every 5 years. Have your cholesterol levels checked more often if:  Your lipid or cholesterol levels are high.  You are older than 22 years of age.  You are at high risk for heart disease. What should I know about cancer screening? Depending on your health history and family history, you may need to  have cancer screening at various ages. This may include screening for:  Breast cancer.  Cervical cancer.  Colorectal cancer.  Skin cancer.  Lung cancer. What should I know about heart disease, diabetes, and high blood pressure? Blood pressure and heart disease  High blood pressure causes heart disease and increases the risk of stroke. This is more likely to develop in people who have high blood pressure readings, are of African descent, or are overweight.  Have your blood pressure checked: ? Every 3-5 years if you are 2-20 years of age. ? Every year if you are 47 years old or older. Diabetes Have regular diabetes screenings. This checks your fasting blood sugar level. Have the screening done:  Once every three years after age 46 if you are at a normal weight and have a low risk for diabetes.  More often and at a younger age if you are overweight or have a high risk for diabetes. What should I know about preventing infection? Hepatitis B If you have a higher risk for hepatitis B, you should be screened for this virus. Talk with your health care provider to find out if you are at risk for hepatitis B infection. Hepatitis C Testing is recommended for:  Everyone born from [birth year] through 1965.  Anyone with known risk factors for hepatitis C. Sexually transmitted infections (STIs)  Get screened for STIs, including gonorrhea and chlamydia, if: ? You are sexually active and are younger than 22 years of age. ? You are older than 22 years of age and your health  care provider tells you that you are at risk for this type of infection. ? Your sexual activity has changed since you were last screened, and you are at increased risk for chlamydia or gonorrhea. Ask your health care provider if you are at risk.  Ask your health care provider about whether you are at high risk for HIV. Your health care provider may recommend a prescription medicine to help prevent HIV infection. If you choose to  take medicine to prevent HIV, you should first get tested for HIV. You should then be tested every 3 months for as long as you are taking the medicine. Pregnancy  If you are about to stop having your period (premenopausal) and you may become pregnant, seek counseling before you get pregnant.  Take 400 to 800 micrograms (mcg) of folic acid every day if you become pregnant.  Ask for birth control (contraception) if you want to prevent pregnancy. Osteoporosis and menopause Osteoporosis is a disease in which the bones lose minerals and strength with aging. This can result in bone fractures. If you are 74 years old or older, or if you are at risk for osteoporosis and fractures, ask your health care provider if you should:  Be screened for bone loss.  Take a calcium or vitamin D supplement to lower your risk of fractures.  Be given hormone replacement therapy (HRT) to treat symptoms of menopause. Follow these instructions at home: Lifestyle  Do not use any products that contain nicotine or tobacco, such as cigarettes, e-cigarettes, and chewing tobacco. If you need help quitting, ask your health care provider.  Do not use street drugs.  Do not share needles.  Ask your health care provider for help if you need support or information about quitting drugs. Alcohol use  Do not drink alcohol if: ? Your health care provider tells you not to drink. ? You are pregnant, may be pregnant, or are planning to become pregnant.  If you drink alcohol: ? Limit how much you use to 0-1 drink a day. ? Limit intake if you are breastfeeding.  Be aware of how much alcohol is in your drink. In the [place] one drink equals one 12 oz bottle of beer (355 mL), one 5 oz glass of wine (148 mL), or one 1 oz glass of hard liquor (44 mL). General instructions  Schedule regular health, dental, and eye exams.  Stay current with your vaccines.  Tell your health care provider if: ? You often feel depressed. ? You  have ever been abused or do not feel safe at home. Summary  Adopting a healthy lifestyle and getting preventive care are important in promoting health and wellness.  Follow your health care provider's instructions about healthy diet, exercising, and getting tested or screened for diseases.  Follow your health care provider's instructions on monitoring your cholesterol and blood pressure. This information is not intended to replace advice given to you by your health care provider. Make sure you discuss any questions you have with your health care provider. Document Revised: 11/27/2018 Document Reviewed: 11/27/2018 Elsevier Patient Education  [place]. Sinusitis, Adult Sinusitis is inflammation of your sinuses. Sinuses are hollow spaces in the bones around your face. Your sinuses are located:  Around your eyes.  In the middle of your forehead.  Behind your nose.  In your cheekbones. Mucus normally drains out of your sinuses. When your nasal tissues become inflamed or swollen, mucus can become trapped or blocked. This allows bacteria, viruses, and fungi  to grow, which leads to infection. Most infections of the sinuses are caused by a virus. Sinusitis can develop quickly. It can last for up to 4 weeks (acute) or for more than 12 weeks (chronic). Sinusitis often develops after a cold. What are the causes? This condition is caused by anything that creates swelling in the sinuses or stops mucus from draining. This includes:  Allergies.  Asthma.  Infection from bacteria or viruses.  Deformities or blockages in your nose or sinuses.  Abnormal growths in the nose (nasal polyps).  Pollutants, such as chemicals or irritants in the air.  Infection from fungi (rare). What increases the risk? You are more likely to develop this condition if you:  Have a weak body defense system (immune system).  Do a lot of swimming or diving.  Overuse nasal sprays.  Smoke. What are the  signs or symptoms? The main symptoms of this condition are pain and a feeling of pressure around the affected sinuses. Other symptoms include:  Stuffy nose or congestion.  Thick drainage from your nose.  Swelling and warmth over the affected sinuses.  Headache.  Upper toothache.  A cough that may get worse at night.  Extra mucus that collects in the throat or the back of the nose (postnasal drip).  Decreased sense of smell and taste.  Fatigue.  A fever.  Sore throat.  Bad breath. How is this diagnosed? This condition is diagnosed based on:  Your symptoms.  Your medical history.  A physical exam.  Tests to find out if your condition is acute or chronic. This may include: ? Checking your nose for nasal polyps. ? Viewing your sinuses using a device that has a light (endoscope). ? Testing for allergies or bacteria. ? Imaging tests, such as an MRI or CT scan. In rare cases, a bone biopsy may be done to rule out more serious types of fungal sinus disease. How is this treated? Treatment for sinusitis depends on the cause and whether your condition is chronic or acute.  If caused by a virus, your symptoms should go away on their own within 10 days. You may be given medicines to relieve symptoms. They include: ? Medicines that shrink swollen nasal passages (topical intranasal decongestants). ? Medicines that treat allergies (antihistamines). ? A spray that eases inflammation of the nostrils (topical intranasal corticosteroids). ? Rinses that help get rid of thick mucus in your nose (nasal saline washes).  If caused by bacteria, your health care provider may recommend waiting to see if your symptoms improve. Most bacterial infections will get better without antibiotic medicine. You may be given antibiotics if you have: ? A severe infection. ? A weak immune system.  If caused by narrow nasal passages or nasal polyps, you may need to have surgery. Follow these instructions  at home: Medicines  Take, use, or apply over-the-counter and prescription medicines only as told by your health care provider. These may include nasal sprays.  If you were prescribed an antibiotic medicine, take it as told by your health care provider. Do not stop taking the antibiotic even if you start to feel better. Hydrate and humidify   Drink enough fluid to keep your urine pale yellow. Staying hydrated will help to thin your mucus.  Use a cool mist humidifier to keep the humidity level in your home above 50%.  Inhale steam for 10-15 minutes, 3-4 times a day, or as told by your health care provider. You can do this in the bathroom while  a hot shower is running.  Limit your exposure to cool or dry air. Rest  Rest as much as possible.  Sleep with your head raised (elevated).  Make sure you get enough sleep each night. General instructions   Apply a warm, moist washcloth to your face 3-4 times a day or as told by your health care provider. This will help with discomfort.  Wash your hands often with soap and water to reduce your exposure to germs. If soap and water are not available, use hand sanitizer.  Do not smoke. Avoid being around people who are smoking (secondhand smoke).  Keep all follow-up visits as told by your health care provider. This is important. Contact a health care provider if:  You have a fever.  Your symptoms get worse.  Your symptoms do not improve within 10 days. Get help right away if:  You have a severe headache.  You have persistent vomiting.  You have severe pain or swelling around your face or eyes.  You have vision problems.  You develop confusion.  Your neck is stiff.  You have trouble breathing. Summary  Sinusitis is soreness and inflammation of your sinuses. Sinuses are hollow spaces in the bones around your face.  This condition is caused by nasal tissues that become inflamed or swollen. The swelling traps or blocks the flow  of mucus. This allows bacteria, viruses, and fungi to grow, which leads to infection.  If you were prescribed an antibiotic medicine, take it as told by your health care provider. Do not stop taking the antibiotic even if you start to feel better.  Keep all follow-up visits as told by your health care provider. This is important. This information is not intended to replace advice given to you by your health care provider. Make sure you discuss any questions you have with your health care provider. Document Revised: 05/06/2018 Document Reviewed: 05/06/2018 Elsevier Patient Education  [place].

## 2020-06-29 LAB — CBC WITH DIFFERENTIAL/PLATELET
Basophils Absolute: 0 10*3/uL (ref 0.0–0.2)
Basos: 0 %
EOS (ABSOLUTE): 0 10*3/uL (ref 0.0–0.4)
Eos: 1 %
Hematocrit: 40.8 % (ref 34.0–46.6)
Hemoglobin: 13 g/dL (ref 11.1–15.9)
Immature Grans (Abs): 0 10*3/uL (ref 0.0–0.1)
Immature Granulocytes: 0 %
Lymphocytes Absolute: 1.5 10*3/uL (ref 0.7–3.1)
Lymphs: 33 %
MCH: 27.8 pg (ref 26.6–33.0)
MCHC: 31.9 g/dL (ref 31.5–35.7)
MCV: 87 fL (ref 79–97)
Monocytes Absolute: 0.4 10*3/uL (ref 0.1–0.9)
Monocytes: 8 %
Neutrophils Absolute: 2.7 10*3/uL (ref 1.4–7.0)
Neutrophils: 58 %
Platelets: 272 10*3/uL (ref 150–450)
RBC: 4.68 x10E6/uL (ref 3.77–5.28)
RDW: 11.8 % (ref 11.7–15.4)
WBC: 4.6 10*3/uL (ref 3.4–10.8)

## 2020-06-29 LAB — COMPREHENSIVE METABOLIC PANEL
ALT: 8 IU/L (ref 0–32)
AST: 15 IU/L (ref 0–40)
Albumin/Globulin Ratio: 1.8 (ref 1.2–2.2)
Albumin: 4.7 g/dL (ref 3.9–5.0)
Alkaline Phosphatase: 67 IU/L (ref 48–121)
BUN/Creatinine Ratio: 19 (ref 9–23)
BUN: 14 mg/dL (ref 6–20)
Bilirubin Total: 0.7 mg/dL (ref 0.0–1.2)
CO2: 23 mmol/L (ref 20–29)
Calcium: 9.2 mg/dL (ref 8.7–10.2)
Chloride: 103 mmol/L (ref 96–106)
Creatinine, Ser: 0.75 mg/dL (ref 0.57–1.00)
GFR calc Af Amer: 131 mL/min/{1.73_m2} (ref >59)
GFR calc non Af Amer: 114 mL/min/{1.73_m2} (ref >59)
Globulin, Total: 2.6 g/dL (ref 1.5–4.5)
Glucose: 86 mg/dL (ref 65–99)
Potassium: 3.9 mmol/L (ref 3.5–5.2)
Sodium: 139 mmol/L (ref 134–144)
Total Protein: 7.3 g/dL (ref 6.0–8.5)

## 2020-06-29 LAB — LIPID PANEL W/O CHOL/HDL RATIO
Cholesterol, Total: 135 mg/dL (ref 100–199)
HDL: 54 mg/dL (ref >39)
LDL Chol Calc (NIH): 69 mg/dL (ref 0–99)
Triglycerides: 54 mg/dL (ref 0–149)
VLDL Cholesterol Cal: 12 mg/dL (ref 5–40)

## 2020-06-29 LAB — TSH: TSH: 0.749 u[IU]/mL (ref 0.450–4.500)

## 2020-06-29 LAB — VITAMIN D 25 HYDROXY (VIT D DEFICIENCY, FRACTURES): Vit D, 25-Hydroxy: 27.3 ng/mL — ABNORMAL LOW (ref 30.0–100.0)

## 2020-06-29 NOTE — Progress Notes (Signed)
TSH for thyroid low end normal , CBC within normal limits, no anemia or signs of infection.  Cholesterol within normal limits.  CMP within normal limits electrolytes and liver and kidney function.   Vitamin D is low, recommend prescription Vitamin D for 8 weeks. Take one tablet Vitamin D 50,0000 international units once weekly. On the 9th week start over the counter vitamin D 3 at 4,000 international units once daily by mouth.   Recheck TSH and Vitamin D lab in 3 months .

## 2020-07-05 DIAGNOSIS — J019 Acute sinusitis, unspecified: Secondary | ICD-10-CM | POA: Diagnosis not present

## 2020-07-05 DIAGNOSIS — J328 Other chronic sinusitis: Secondary | ICD-10-CM | POA: Diagnosis not present

## 2020-07-06 ENCOUNTER — Ambulatory Visit: Payer: Medicaid Other | Admitting: Adult Health

## 2020-07-13 ENCOUNTER — Encounter: Payer: Self-pay | Admitting: Obstetrics & Gynecology

## 2020-07-13 ENCOUNTER — Other Ambulatory Visit (HOSPITAL_COMMUNITY)
Admission: RE | Admit: 2020-07-13 | Discharge: 2020-07-13 | Disposition: A | Discharge status: Home / Self Care | Payer: Medicaid Other | Source: Ambulatory Visit | Attending: Obstetrics & Gynecology | Admitting: Obstetrics & Gynecology

## 2020-07-13 ENCOUNTER — Ambulatory Visit (INDEPENDENT_AMBULATORY_CARE_PROVIDER_SITE_OTHER): Payer: Medicaid Other | Admitting: Obstetrics & Gynecology

## 2020-07-13 ENCOUNTER — Other Ambulatory Visit: Payer: Self-pay

## 2020-07-13 VITALS — BP 100/60 | Ht 64.0 in | Wt 141.0 lb

## 2020-07-13 DIAGNOSIS — Z124 Encounter for screening for malignant neoplasm of cervix: Secondary | ICD-10-CM

## 2020-07-13 DIAGNOSIS — Z01419 Encounter for gynecological examination (general) (routine) without abnormal findings: Secondary | ICD-10-CM | POA: Diagnosis not present

## 2020-07-13 NOTE — Progress Notes (Signed)
HPI:      [name] is a 24 y.o. G1P0102 who LMP was Patient's last menstrual period was 06/26/2020., she presents today for her annual examination. The patient has no complaints today. The patient is sexually active. Her last pap: was normal. The patient does perform self breast exams.  There is no notable family history of breast or ovarian cancer in her family.  The patient has regular exercise: yes.  The patient denies current symptoms of depression.  On Zoloft for PPD, doing well.  GYN History: Contraception: vasectomy  Twins last year, thriving  PMHx: Past Medical History:  Diagnosis Date  . Family history of adverse reaction to anesthesia    heart condition  . GERD (gastroesophageal reflux disease)   . Migraine    Past Surgical History:  Procedure Laterality Date  . NO PAST SURGERIES    . WISDOM TOOTH EXTRACTION     Family History  Problem Relation Age of Onset  . Migraines Mother   . Skin cancer Father   . Asthma Maternal Grandmother   . Hypertension Maternal Grandmother   . Thyroid disease Maternal Grandmother   . Hypertension Maternal Grandfather   . Pancreatic cancer Maternal Grandfather 68  . Hypertension Paternal Grandmother   . Diabetes Paternal Grandmother   . Hypertension Paternal Grandfather   . Diabetes Paternal Grandfather   . Thyroid disease Maternal Aunt   . Breast cancer Maternal Aunt 49  . Allergies Brother    Social History   Tobacco Use  . Smoking status: Never Smoker  . Smokeless tobacco: Never Used  Vaping Use  . Vaping Use: Never used  Substance Use Topics  . Alcohol use: Never  . Drug use: No    Current Outpatient Medications:  .  cetirizine (ZYRTEC) 10 MG tablet, Take 1 tablet (10 mg total) by mouth daily., Disp: 30 tablet, Rfl: 1 Allergies: Imitrex [sumatriptan] and Penicillins  Review of Systems  Constitutional: Negative for chills, fever and malaise/fatigue.  HENT: Negative for congestion, sinus pain and sore throat.     Eyes: Negative for blurred vision and pain.  Respiratory: Negative for cough and wheezing.   Cardiovascular: Negative for chest pain and leg swelling.  Gastrointestinal: Negative for abdominal pain, constipation, diarrhea, heartburn, nausea and vomiting.  Genitourinary: Negative for dysuria, frequency, hematuria and urgency.  Musculoskeletal: Negative for back pain, joint pain, myalgias and neck pain.  Skin: Negative for itching and rash.  Neurological: Negative for dizziness, tremors and weakness.  Endo/Heme/Allergies: Does not bruise/bleed easily.  Psychiatric/Behavioral: Negative for depression. The patient is not nervous/anxious and does not have insomnia.     Objective: BP (!) 100/60   Ht 5\' 4"  (1.626 m)   Wt 141 lb (64 kg)   LMP 06/26/2020   BMI 24.20 kg/m   Filed Weights   07/13/20 1517  Weight: 141 lb (64 kg)   Body mass index is 24.2 kg/m. Physical Exam Constitutional:      General: She is not in acute distress.    Appearance: She is well-developed.  Genitourinary:     Pelvic exam was performed with patient supine.     Vagina, uterus and rectum normal.     No lesions in the vagina.     No vaginal bleeding.     No cervical motion tenderness, friability, lesion or polyp.     Uterus is mobile.     Uterus is not enlarged.     No uterine mass detected.    Uterus  is midaxial.     No right or left adnexal mass present.     Right adnexa not tender.     Left adnexa not tender.  HENT:     Head: Normocephalic and atraumatic. No laceration.     Right Ear: Hearing normal.     Left Ear: Hearing normal.     Mouth/Throat:     Pharynx: Uvula midline.  Eyes:     Pupils: Pupils are equal, round, and reactive to light.  Neck:     Thyroid: No thyromegaly.  Cardiovascular:     Rate and Rhythm: Normal rate and regular rhythm.     Heart sounds: No murmur heard.  No friction rub. No gallop.   Pulmonary:     Effort: Pulmonary effort is normal. No respiratory distress.      Breath sounds: Normal breath sounds. No wheezing.  Chest:     Breasts:        Right: No mass, skin change or tenderness.        Left: No mass, skin change or tenderness.  Abdominal:     General: Bowel sounds are normal. There is no distension.     Palpations: Abdomen is soft.     Tenderness: There is no abdominal tenderness. There is no rebound.  Musculoskeletal:        General: Normal range of motion.     Cervical back: Normal range of motion and neck supple.  Neurological:     Mental Status: She is alert and oriented to person, place, and time.     Cranial Nerves: No cranial nerve deficit.  Skin:    General: Skin is warm and dry.  Psychiatric:        Judgment: Judgment normal.  Vitals reviewed.     Assessment:  ANNUAL EXAM 1. Screening for cervical cancer   2. Women's annual routine gynecological examination      Screening Plan:            1.  Cervical Screening-  Pap smear done today  2. Breast screening- Exam annually and mammogram>40 planned   3. Labs not needed  4. Counseling for contraception: vasectomy   5. Taper off of Zoloft at this time. Counseled on risks.      F/U  Return in about 1 year (around 07/13/2021) for Annual.  Barnett Applebaum, MD, [facility], [facility] 07/13/2020  3:46 PM

## 2020-07-13 NOTE — Patient Instructions (Signed)
Health Maintenance, Female  Taper off of Zoloft  Adopting a healthy lifestyle and getting preventive care are important in promoting health and wellness. Ask your health care provider about:  The right schedule for you to have regular tests and exams.  Things you can do on your own to prevent diseases and keep yourself healthy. What should I know about diet, weight, and exercise? Eat a healthy diet   Eat a diet that includes plenty of vegetables, fruits, low-fat dairy products, and lean protein.  Do not eat a lot of foods that are high in solid fats, added sugars, or sodium. Maintain a healthy weight Body mass index (BMI) is used to identify weight problems. It estimates body fat based on height and weight. Your health care provider can help determine your BMI and help you achieve or maintain a healthy weight. Get regular exercise Get regular exercise. This is one of the most important things you can do for your health. Most adults should:  Exercise for at least 150 minutes each week. The exercise should increase your heart rate and make you sweat (moderate-intensity exercise).  Do strengthening exercises at least twice a week. This is in addition to the moderate-intensity exercise.  Spend less time sitting. Even light physical activity can be beneficial. Watch cholesterol and blood lipids Have your blood tested for lipids and cholesterol at 22 years of age, then have this test every 5 years. Have your cholesterol levels checked more often if:  Your lipid or cholesterol levels are high.  You are older than 22 years of age.  You are at high risk for heart disease. What should I know about cancer screening? Depending on your health history and family history, you may need to have cancer screening at various ages. This may include screening for:  Breast cancer.  Cervical cancer.  Colorectal cancer.  Skin cancer.  Lung cancer. What should I know about heart disease,  diabetes, and high blood pressure? Blood pressure and heart disease  High blood pressure causes heart disease and increases the risk of stroke. This is more likely to develop in people who have high blood pressure readings, are of African descent, or are overweight.  Have your blood pressure checked: ? Every 3-5 years if you are 75-31 years of age. ? Every year if you are 28 years old or older. Diabetes Have regular diabetes screenings. This checks your fasting blood sugar level. Have the screening done:  Once every three years after age 36 if you are at a normal weight and have a low risk for diabetes.  More often and at a younger age if you are overweight or have a high risk for diabetes. What should I know about preventing infection? Hepatitis B If you have a higher risk for hepatitis B, you should be screened for this virus. Talk with your health care provider to find out if you are at risk for hepatitis B infection. Hepatitis C Testing is recommended for:  Everyone born from [birth year] through 1965.  Anyone with known risk factors for hepatitis C. Sexually transmitted infections (STIs)  Get screened for STIs, including gonorrhea and chlamydia, if: ? You are sexually active and are younger than 22 years of age. ? You are older than 22 years of age and your health care provider tells you that you are at risk for this type of infection. ? Your sexual activity has changed since you were last screened, and you are at increased risk for chlamydia or gonorrhea.  Ask your health care provider if you are at risk.  Ask your health care provider about whether you are at high risk for HIV. Your health care provider may recommend a prescription medicine to help prevent HIV infection. If you choose to take medicine to prevent HIV, you should first get tested for HIV. You should then be tested every 3 months for as long as you are taking the medicine. Pregnancy  If you are about to stop having your  period (premenopausal) and you may become pregnant, seek counseling before you get pregnant.  Take 400 to 800 micrograms (mcg) of folic acid every day if you become pregnant.  Ask for birth control (contraception) if you want to prevent pregnancy. Osteoporosis and menopause Osteoporosis is a disease in which the bones lose minerals and strength with aging. This can result in bone fractures. If you are 22 years old or older, or if you are at risk for osteoporosis and fractures, ask your health care provider if you should:  Be screened for bone loss.  Take a calcium or vitamin D supplement to lower your risk of fractures.  Be given hormone replacement therapy (HRT) to treat symptoms of menopause. Follow these instructions at home: Lifestyle  Do not use any products that contain nicotine or tobacco, such as cigarettes, e-cigarettes, and chewing tobacco. If you need help quitting, ask your health care provider.  Do not use street drugs.  Do not share needles.  Ask your health care provider for help if you need support or information about quitting drugs. Alcohol use  Do not drink alcohol if: ? Your health care provider tells you not to drink. ? You are pregnant, may be pregnant, or are planning to become pregnant.  If you drink alcohol: ? Limit how much you use to 0-1 drink a day. ? Limit intake if you are breastfeeding.  Be aware of how much alcohol is in your drink. In the [place] one drink equals one 12 oz bottle of beer (355 mL), one 5 oz glass of wine (148 mL), or one 1 oz glass of hard liquor (44 mL). General instructions  Schedule regular health, dental, and eye exams.  Stay current with your vaccines.  Tell your health care provider if: ? You often feel depressed. ? You have ever been abused or do not feel safe at home. Summary  Adopting a healthy lifestyle and getting preventive care are important in promoting health and wellness.  Follow your health care provider's  instructions about healthy diet, exercising, and getting tested or screened for diseases.  Follow your health care provider's instructions on monitoring your cholesterol and blood pressure. This information is not intended to replace advice given to you by your health care provider. Make sure you discuss any questions you have with your health care provider. Document Revised: 11/27/2018 Document Reviewed: 11/27/2018 Elsevier Patient Education  2020 [employer].

## 2020-07-15 DIAGNOSIS — J019 Acute sinusitis, unspecified: Secondary | ICD-10-CM | POA: Diagnosis not present

## 2020-07-16 LAB — CYTOLOGY - PAP
Chlamydia: NEGATIVE
Comment: NEGATIVE
Comment: NORMAL
Neisseria Gonorrhea: NEGATIVE

## 2020-07-17 NOTE — Telephone Encounter (Signed)
[facility] as able

## 2020-07-19 NOTE — Telephone Encounter (Signed)
Voicemail is full unable to leave message

## 2020-07-20 ENCOUNTER — Other Ambulatory Visit: Payer: Self-pay | Admitting: Obstetrics & Gynecology

## 2020-07-20 MED ORDER — SERTRALINE HCL 50 MG PO TABS: 50.0000 mg | ORAL_TABLET | Freq: Every day | ORAL | 11 refills | Status: DC

## 2020-08-12 ENCOUNTER — Encounter: Payer: Self-pay | Admitting: Adult Health

## 2020-08-12 MED ORDER — NURTEC 75 MG PO TBDP: ORAL_TABLET | ORAL | 11 refills | Status: DC

## 2020-08-15 ENCOUNTER — Encounter: Payer: Self-pay | Admitting: Adult Health

## 2020-08-16 ENCOUNTER — Ambulatory Visit (INDEPENDENT_AMBULATORY_CARE_PROVIDER_SITE_OTHER): Payer: Medicaid Other | Admitting: Obstetrics & Gynecology

## 2020-08-16 ENCOUNTER — Encounter: Payer: Self-pay | Admitting: Obstetrics & Gynecology

## 2020-08-16 ENCOUNTER — Other Ambulatory Visit (HOSPITAL_COMMUNITY)
Admission: RE | Admit: 2020-08-16 | Discharge: 2020-08-16 | Disposition: A | Discharge status: Home / Self Care | Payer: Medicaid Other | Source: Ambulatory Visit | Attending: Obstetrics & Gynecology | Admitting: Obstetrics & Gynecology

## 2020-08-16 ENCOUNTER — Other Ambulatory Visit: Payer: Self-pay

## 2020-08-16 VITALS — BP 120/70 | Ht 64.0 in | Wt 152.0 lb

## 2020-08-16 DIAGNOSIS — R87612 Low grade squamous intraepithelial lesion on cytologic smear of cervix (LGSIL): Secondary | ICD-10-CM

## 2020-08-16 NOTE — Progress Notes (Signed)
HPI:  Amanda [name] is a 24 y.o.  [phone]  who presents today for evaluation and management of abnormal cervical cytology.    Dysplasia History:  LGSIL recent PAP  ROS:  Pertinent items are noted in HPI.  OB History  Gravida Para Term Preterm AB Living  1 1 0 1 0 2  SAB TAB Ectopic Multiple Live Births  0 0 0 1 2    # Outcome Date GA Lbr Len/2nd Weight Sex Delivery Anes PTL Lv  1A Preterm 12/06/19 [redacted]w[redacted]d / 00:46 4 lb 7.6 oz (2.03 kg) F Vag-Spont EPI  LIV     Birth Comments: no anomalies noted at delivery  1B Preterm 12/06/19 [redacted]w[redacted]d / 00:51 4 lb 1.6 oz (1.86 kg) F Vag-Breech EPI  LIV    Past Medical History:  Diagnosis Date  . Family history of adverse reaction to anesthesia    heart condition  . GERD (gastroesophageal reflux disease)   . Migraine     Past Surgical History:  Procedure Laterality Date  . NO PAST SURGERIES    . WISDOM TOOTH EXTRACTION      SOCIAL HISTORY: Social History   Substance and Sexual Activity  Alcohol Use Never   Social History   Substance and Sexual Activity  Drug Use No     Family History  Problem Relation Age of Onset  . Migraines Mother   . Skin cancer Father   . Asthma Maternal Grandmother   . Hypertension Maternal Grandmother   . Thyroid disease Maternal Grandmother   . Hypertension Maternal Grandfather   . Pancreatic cancer Maternal Grandfather 86  . Hypertension Paternal Grandmother   . Diabetes Paternal Grandmother   . Hypertension Paternal Grandfather   . Diabetes Paternal Grandfather   . Thyroid disease Maternal Aunt   . Breast cancer Maternal Aunt 48  . Allergies Brother     ALLERGIES:  Imitrex [sumatriptan] and Penicillins  Current Outpatient Medications on File Prior to Visit  Medication Sig Dispense Refill  . cetirizine (ZYRTEC) 10 MG tablet Take 1 tablet (10 mg total) by mouth daily. 30 tablet 1  . Rimegepant Sulfate (NURTEC) 75 MG TBDP Take one tablet as needed for headache. 8 tablet 11  . sertraline (ZOLOFT)  50 MG tablet Take 1 tablet (50 mg total) by mouth daily. 30 tablet 11   No current facility-administered medications on file prior to visit.    Physical Exam: -Vitals:  BP 120/70   Ht 5\' 4"  (1.626 m)   Wt 152 lb (68.9 kg)   LMP 08/02/2020   BMI 26.09 kg/m  GEN: WD, WN, NAD.  A+ O x 3, good mood and affect. ABD:  NT, ND.  Soft, no masses.  No hernias noted.   Pelvic:   Vulva: Normal appearance.  No lesions.  Vagina: No lesions or abnormalities noted.  Support: Normal pelvic support.  Urethra No masses tenderness or scarring.  Meatus Normal size without lesions or prolapse.  Cervix: See below.  Anus: Normal exam.  No lesions.  Perineum: Normal exam.  No lesions.        Bimanual   Uterus: Normal size.  Non-tender.  Mobile.  AV.  Adnexae: No masses.  Non-tender to palpation.  Cul-de-sac: Negative for abnormality.   PROCEDURE: 1.  Urine Pregnancy Test:  not done 2.  Colposcopy performed with 4% acetic acid after verbal consent obtained                                         -  Aceto-white Lesions Location(s): TZ at os w white changes all around              -Biopsy performed at 6, 12 o'clock               -ECC indicated and performed: Yes.       -Biopsy sites made hemostatic with pressure, AgNO3, and/or Monsel's solution   -Satisfactory colposcopy: Yes.      -Evidence of Invasive cervical CA :  NO  ASSESSMENT:  Amanda [name] is a 24 y.o. [id number] here for  1. LGSIL on Pap smear of cervix   .  PLAN: 1.  I discussed the grading system of pap smears and HPV high risk viral types.  We will discuss and base management after colpo results return. 2. Follow up PAP 6 months, vs intervention if high grade dysplasia identified     Amanda Applebaum, MD, [facility] 08/16/2020  10:39 AM

## 2020-08-16 NOTE — Patient Instructions (Signed)

## 2020-08-16 NOTE — Addendum Note (Signed)
Addended by: Gae Dry on: 08/16/2020 10:51 AM   Modules accepted: Orders

## 2020-08-17 LAB — SURGICAL PATHOLOGY

## 2020-08-18 ENCOUNTER — Encounter: Payer: Self-pay | Admitting: Obstetrics and Gynecology

## 2020-08-24 NOTE — Telephone Encounter (Signed)
No opening with any provider at this time . Please advise

## 2020-08-24 NOTE — Telephone Encounter (Signed)
Voicemail box is full - unable to leave message

## 2020-08-24 NOTE — Telephone Encounter (Signed)
Patient is schedule for for 09/02/20 with R[facility]. Patient lives 30 minutes away and can't come today. Patient would like to know what to do for her pain until then. Please advise

## 2020-08-24 NOTE — Telephone Encounter (Signed)
Does RPH have anything tomorrow?

## 2020-08-25 ENCOUNTER — Ambulatory Visit (INDEPENDENT_AMBULATORY_CARE_PROVIDER_SITE_OTHER): Payer: Medicaid Other

## 2020-08-25 ENCOUNTER — Other Ambulatory Visit: Payer: Self-pay

## 2020-08-25 DIAGNOSIS — Z809 Family history of malignant neoplasm, unspecified: Secondary | ICD-10-CM

## 2020-08-25 NOTE — Progress Notes (Signed)
Pt came in for [facility] blood draw.  I had the pt fill out the paperwork and TS drew the blood.  Will get PH to select order and then will send specimen out.

## 2020-08-31 ENCOUNTER — Encounter: Payer: Self-pay | Admitting: Adult Health
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
## 2020-09-01 ENCOUNTER — Encounter: Payer: Self-pay | Admitting: Neurology

## 2020-09-01 ENCOUNTER — Telehealth (INDEPENDENT_AMBULATORY_CARE_PROVIDER_SITE_OTHER): Payer: Medicaid Other | Admitting: Neurology

## 2020-09-01 DIAGNOSIS — G43711 Chronic migraine without aura, intractable, with status migrainosus: Secondary | ICD-10-CM | POA: Diagnosis not present

## 2020-09-01 MED ORDER — NURTEC 75 MG PO TBDP: 75.0000 mg | ORAL_TABLET | ORAL | 11 refills | Status: DC

## 2020-09-01 MED ORDER — NURTEC 75 MG PO TBDP: 75.0000 mg | ORAL_TABLET | ORAL | 0 refills | Status: DC

## 2020-09-01 NOTE — Patient Instructions (Addendum)
Nurtec every other day   Rimegepant oral dissolving tablet What is this medicine? RIMEGEPANT (ri ME je pant) is used to treat migraine headaches with or without aura. An aura is a strange feeling or visual disturbance that warns you of an attack. It is not used to prevent migraines. This medicine may be used for other purposes; ask your health care provider or pharmacist if you have questions. COMMON BRAND NAME(S): NURTEC ODT What should I tell my health care provider before I take this medicine? They need to know if you have any of these conditions:  kidney disease  liver disease  an unusual or allergic reaction to rimegepant, other medicines, foods, dyes, or preservatives  pregnant or trying to get pregnant  breast-feeding How should I use this medicine? Take the medicine by mouth. Follow the directions on the prescription label. Leave the tablet in the sealed blister pack until you are ready to take it. With dry hands, open the blister and gently remove the tablet. If the tablet breaks or crumbles, throw it away and take a new tablet out of the blister pack. Place the tablet in the mouth and allow it to dissolve, and then swallow. Do not cut, crush, or chew this medicine. You do not need water to take this medicine. Talk to your pediatrician about the use of this medicine in children. Special care may be needed. Overdosage: If you think you have taken too much of this medicine contact a poison control center or emergency room at once. NOTE: This medicine is only for you. Do not share this medicine with others. What if I miss a dose? This does not apply. This medicine is not for regular use. What may interact with this medicine? This medicine may interact with the following medications:  certain medicines for fungal infections like fluconazole, itraconazole  rifampin This list may not describe all possible interactions. Give your health care provider a list of all the medicines,  herbs, non-prescription drugs, or dietary supplements you use. Also tell them if you smoke, drink alcohol, or use illegal drugs. Some items may interact with your medicine. What should I watch for while using this medicine? Visit your health care professional for regular checks on your progress. Tell your health care professional if your symptoms do not start to get better or if they get worse. What side effects may I notice from receiving this medicine? Side effects that you should report to your doctor or health care professional as soon as possible:  allergic reactions like skin rash, itching or hives; swelling of the face, lips, or tongue Side effects that usually do not require medical attention (report these to your doctor or health care professional if they continue or are bothersome):  nausea This list may not describe all possible side effects. Call your doctor for medical advice about side effects. You may report side effects to FDA at 1-800-FDA-1088. Where should I keep my medicine? Keep out of the reach of children. Store at room temperature between 15 and 30 degrees C (59 and 86 degrees F). Throw away any unused medicine after the expiration date. NOTE: This sheet is a summary. It may not cover all possible information. If you have questions about this medicine, talk to your doctor, pharmacist, or health care provider.  2020 Elsevier/Gold Standard (2019-02-17 00:21:31)

## 2020-09-02 ENCOUNTER — Ambulatory Visit: Payer: Medicaid Other | Admitting: Obstetrics & Gynecology

## 2020-09-06 ENCOUNTER — Other Ambulatory Visit: Payer: Self-pay | Admitting: Adult Health

## 2020-09-06 DIAGNOSIS — G43701 Chronic migraine without aura, not intractable, with status migrainosus: Secondary | ICD-10-CM

## 2020-09-09 ENCOUNTER — Encounter: Payer: Self-pay | Admitting: Neurology

## 2020-09-09 ENCOUNTER — Telehealth: Payer: Self-pay

## 2020-09-09 NOTE — Telephone Encounter (Signed)
Copied from [place] [phone]. Topic: General - Other >> Sep 08, 2020  3:45 PM Leonides Schanz, Ja-Kwan wrote: Reason for CRM: Pt stated she had a missed call from the office so she was just returning the call.

## 2020-09-15 ENCOUNTER — Ambulatory Visit (INDEPENDENT_AMBULATORY_CARE_PROVIDER_SITE_OTHER): Payer: Medicaid Other | Admitting: Obstetrics & Gynecology

## 2020-09-15 ENCOUNTER — Other Ambulatory Visit: Payer: Self-pay

## 2020-09-15 ENCOUNTER — Encounter: Payer: Self-pay | Admitting: Obstetrics & Gynecology

## 2020-09-15 VITALS — BP 120/80 | Ht 64.0 in | Wt 153.0 lb

## 2020-09-15 DIAGNOSIS — R102 Pelvic and perineal pain: Secondary | ICD-10-CM

## 2020-09-15 NOTE — Progress Notes (Signed)
Gynecology Pelvic Pain Evaluation   Chief Complaint:  Chief Complaint  Patient presents with  . Abdominal Pain  . Back Pain    History of Present Illness:   Patient is a 24 y.o. [id number] who LMP was Patient's last menstrual period was 08/23/2020., presents today for a problem visit.  She complains of pain.   Her pain is localized to the suprapubic and deep pelvis area, described as intermittent, sharp and stabbing, began several months ago and its severity is described as moderate. The pain radiates to the  back. She has these associated symptoms which include none. Patient has these modifiers which include relaxation and pain medication that make it better and pre and post menstrual (and during) that make it worse.  Context includes: menstrual.    Previous evaluation: none. Prior Diagnosis: none. Previous Treatment: none.  PMHx: She  has a past medical history of Family history of adverse reaction to anesthesia, Family history of breast cancer, Family history of pancreatic cancer, GERD (gastroesophageal reflux disease), and Migraine. Also,  has a past surgical history that includes No past surgeries and Wisdom tooth extraction., family history includes Allergies in her brother; Asthma in her maternal grandmother; Breast cancer (age of onset: 30) in her maternal aunt; Colon cancer (age of onset: 34) in her maternal grandfather; Colon cancer (age of onset: 61) in her paternal grandfather; Diabetes in her paternal grandfather and paternal grandmother; Hypertension in her maternal grandfather, maternal grandmother, paternal grandfather, and paternal grandmother; Migraines in her mother; Pancreatic cancer (age of onset: 29) in her maternal grandfather; Skin cancer in her father; Thyroid disease in her maternal aunt and maternal grandmother.,  reports that she has never smoked. She has never used smokeless tobacco. She reports that she does not drink alcohol and does not use drugs.  She has a  current medication list which includes the following prescription(s): cetirizine, nurtec, sertraline, nurtec, and nurtec. Also, is allergic to imitrex [sumatriptan] and penicillins.  Review of Systems  Constitutional: Positive for malaise/fatigue. Negative for chills and fever.  HENT: Negative for congestion, sinus pain and sore throat.   Eyes: Negative for blurred vision and pain.  Respiratory: Negative for cough and wheezing.   Cardiovascular: Negative for chest pain and leg swelling.  Gastrointestinal: Negative for abdominal pain, constipation, diarrhea, heartburn, nausea and vomiting.  Genitourinary: Negative for dysuria, frequency, hematuria and urgency.  Musculoskeletal: Negative for back pain, joint pain, myalgias and neck pain.  Skin: Negative for itching and rash.  Neurological: Positive for headaches. Negative for dizziness, tremors and weakness.  Endo/Heme/Allergies: Does not bruise/bleed easily.  Psychiatric/Behavioral: Positive for depression. The patient is not nervous/anxious and does not have insomnia.     Objective: BP 120/80   Ht 5\' 4"  (1.626 m)   Wt 153 lb (69.4 kg)   LMP 08/23/2020   BMI 26.26 kg/m  Physical Exam Constitutional:      General: She is not in acute distress.    Appearance: She is well-developed.  Genitourinary:     Pelvic exam was performed with patient supine.     Vagina and uterus normal.     No vaginal erythema or bleeding.     No cervical motion tenderness, discharge, polyp or nabothian cyst.     Uterus is mobile.     Uterus is not enlarged.     No uterine mass detected.    Uterus is midaxial.     No right or left adnexal mass present.     Right  adnexa not tender.     Left adnexa not tender.     Genitourinary Comments: No mass  HENT:     Head: Normocephalic and atraumatic.     Nose: Nose normal.  Abdominal:     General: There is no distension.     Palpations: Abdomen is soft.     Tenderness: There is no abdominal tenderness.    Musculoskeletal:        General: Normal range of motion.  Neurological:     Mental Status: She is alert and oriented to person, place, and time.     Cranial Nerves: No cranial nerve deficit.  Skin:    General: Skin is warm and dry.  Psychiatric:        Attention and Perception: Attention normal.        Mood and Affect: Mood and affect normal.        Speech: Speech normal.        Behavior: Behavior normal.        Thought Content: Thought content normal.        Judgment: Judgment normal.     Female chaperone present for pelvic portion of the physical exam  Assessment: 24 y.o. [id number] with pelvic pain related to uterus s/p twin vag delivery 9 mos ago.  Will examine w [place] to assess for additional etiology.  Consider hormones, ablation for management of periods thus pain..  - US PELVIC COMPLETE WITH TRANSVAGINAL; Future  A total of 20 minutes were spent face-to-face with the patient as well as preparation, review, communication, and documentation during this encounter.    Barnett Applebaum, MD, [facility], [facility] 09/15/2020  9:45 AM

## 2020-09-17 DIAGNOSIS — Z803 Family history of malignant neoplasm of breast: Secondary | ICD-10-CM

## 2020-09-17 DIAGNOSIS — Z1371 Encounter for nonprocreative screening for genetic disease carrier status: Secondary | ICD-10-CM

## 2020-09-17 HISTORY — DX: Family history of malignant neoplasm of breast: Z80.3

## 2020-09-17 HISTORY — DX: Encounter for nonprocreative screening for genetic disease carrier status: Z13.71

## 2020-09-28 ENCOUNTER — Ambulatory Visit (INDEPENDENT_AMBULATORY_CARE_PROVIDER_SITE_OTHER): Payer: Medicaid Other | Admitting: Obstetrics & Gynecology

## 2020-09-28 ENCOUNTER — Encounter: Payer: Self-pay | Admitting: Adult Health

## 2020-09-28 ENCOUNTER — Ambulatory Visit (INDEPENDENT_AMBULATORY_CARE_PROVIDER_SITE_OTHER): Payer: Medicaid Other

## 2020-09-28 ENCOUNTER — Other Ambulatory Visit: Payer: Self-pay

## 2020-09-28 ENCOUNTER — Other Ambulatory Visit: Payer: Self-pay | Admitting: Obstetrics & Gynecology

## 2020-09-28 ENCOUNTER — Telehealth (INDEPENDENT_AMBULATORY_CARE_PROVIDER_SITE_OTHER): Payer: Medicaid Other | Admitting: Adult Health

## 2020-09-28 ENCOUNTER — Encounter: Payer: Self-pay | Admitting: Obstetrics & Gynecology

## 2020-09-28 VITALS — BP 100/60 | Ht 64.0 in | Wt 153.0 lb

## 2020-09-28 DIAGNOSIS — R102 Pelvic and perineal pain: Secondary | ICD-10-CM

## 2020-09-28 DIAGNOSIS — N920 Excessive and frequent menstruation with regular cycle: Secondary | ICD-10-CM

## 2020-09-28 DIAGNOSIS — T148XXA Other injury of unspecified body region, initial encounter: Secondary | ICD-10-CM

## 2020-09-28 DIAGNOSIS — S6991XA Unspecified injury of right wrist, hand and finger(s), initial encounter: Secondary | ICD-10-CM | POA: Diagnosis not present

## 2020-09-28 MED ORDER — IBUPROFEN 600 MG PO TABS: 600.0000 mg | ORAL_TABLET | Freq: Three times a day (TID) | ORAL | 0 refills | Status: DC | PRN

## 2020-09-28 MED ORDER — NORETHINDRONE 0.35 MG PO TABS: 1.0000 | ORAL_TABLET | Freq: Every day | ORAL | 6 refills | Status: DC
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
## 2020-09-28 NOTE — Progress Notes (Signed)
HPI: Pt has been having pain w periods, and heavy periods.  She is s/p twins, husband is s/p vasec.  Her pain is localized to the suprapubic and deep pelvis area, described as intermittent, sharp and stabbing, began several months ago and its severity is described as moderate. The pain radiates to the  back. She has these associated symptoms which include none. Patient has these modifiers which include relaxation and pain medication that make it better and pre and post menstrual (and during) that make it worse.  Context includes: menstrual.   Ultrasound demonstrates no masses seen These findings are Pelvis normal  PMHx: She  has a past medical history of Family history of adverse reaction to anesthesia, Family history of breast cancer, Family history of pancreatic cancer, GERD (gastroesophageal reflux disease), and Migraine. Also,  has a past surgical history that includes No past surgeries and Wisdom tooth extraction., family history includes Allergies in her brother; Asthma in her maternal grandmother; Breast cancer (age of onset: 40) in her maternal aunt; Colon cancer (age of onset: 79) in her maternal grandfather; Colon cancer (age of onset: 38) in her paternal grandfather; Diabetes in her paternal grandfather and paternal grandmother; Hypertension in her maternal grandfather, maternal grandmother, paternal grandfather, and paternal grandmother; Migraines in her mother; Pancreatic cancer (age of onset: 36) in her maternal grandfather; Skin cancer in her father; Thyroid disease in her maternal aunt and maternal grandmother.,  reports that she has never smoked. She has never used smokeless tobacco. She reports that she does not drink alcohol and does not use drugs.  She has a current medication list which includes the following prescription(s): cetirizine, ibuprofen, nurtec, nurtec, nurtec, sertraline, and norethindrone. Also, is allergic to imitrex [sumatriptan] and penicillins.  ROS  Objective: BP  100/60   Ht 5\' 4"  (1.626 m)   Wt 153 lb (69.4 kg)   LMP 08/27/2020   BMI 26.26 kg/m   Physical examination Constitutional NAD, Conversant  Skin No rashes, lesions or ulceration.   Extremities: Moves all appropriately.  Normal ROM for age. No lymphadenopathy.  Neuro: Grossly intact  Psych: Oriented to PPT.  Normal mood. Normal affect.   US PELVIS TRANSVAGINAL NON-OB (TV ONLY)  Result Date: 09/28/2020 Patient Name: Amanda [name] DOB: Jan 21, 1996 MRN: [id number] ULTRASOUND REPORT Location: [facility] Date of Service: 09/28/2020 Indications:Pelvic Pain Findings: The uterus is anteverted and measures 7.8 x 5.7 x 3.8 cm. Echo texture is homogenous without evidence of focal masses. The Endometrium measures 9.6 mm. Right Ovary measures 3.7 x 2.4 x 2.2 cm. It is normal in appearance. Left Ovary measures 3.3 x 2.2 x 1.5 cm. It is normal in appearance. Survey of the adnexa demonstrates no adnexal masses. There is no free fluid in the cul de sac. Impression: 1. Normal pelvic ultrasound. Recommendations: 1.Clinical correlation with the patient's History and Physical Exam. Gweneth Dimitri, RT Review of ULTRASOUND.    I have personally reviewed images and report of recent ultrasound done at [facility].    Plan of management to be discussed with patient. Barnett Applebaum, MD, [facility], [facility] 09/28/2020  3:41 PM   Assessment:  Pelvic pain in female  Menorrhagia with regular cycle  Plan POP (as she is a migraine sufferer) and options of Nexplanon, Depo, And Mirena discussed Pros and cons, side effects counseled to pt Rx sent F/u 3-4 mos  A total of 20 minutes were spent face-to-face with the patient as well as preparation, review, communication, and documentation during  this encounter.   Barnett Applebaum, MD, [facility], [facility] 09/28/2020  3:50 PM

## 2020-09-29 DIAGNOSIS — T148XXA Other injury of unspecified body region, initial encounter: Secondary | ICD-10-CM | POA: Insufficient documentation

## 2020-09-29 DIAGNOSIS — S6991XA Unspecified injury of right wrist, hand and finger(s), initial encounter: Secondary | ICD-10-CM | POA: Insufficient documentation

## 2020-09-29 NOTE — Patient Instructions (Signed)
Contusion A contusion is a deep bruise. This is a result of an injury that causes bleeding under the skin. Symptoms of bruising include pain, swelling, and discolored skin. The skin may turn blue, purple, or yellow. Follow these instructions at home: Managing pain, stiffness, and swelling You may use RICE. This stands for:  Resting.  Icing.  Compression, or putting pressure.  Elevating, or raising the injured area. To follow this method, do these actions:  Rest the injured area.  If told, put ice on the injured area. ? Put ice in a plastic bag. ? Place a towel between your skin and the bag. ? Leave the ice on for 20 minutes, 2-3 times per day.  If told, put light pressure (compression) on the injured area using an elastic bandage. Make sure the bandage is not too tight. If the area tingles or becomes numb, remove it and put it back on as told by your doctor.  If possible, raise (elevate) the injured area above the level of your heart while you are sitting or lying down.  General instructions  Take over-the-counter and prescription medicines only as told by your doctor.  Keep all follow-up visits as told by your doctor. This is important. Contact a doctor if:  Your symptoms do not get better after several days of treatment.  Your symptoms get worse.  You have trouble moving the injured area. Get help right away if:  You have very bad pain.  You have a loss of feeling (numbness) in a hand or foot.  Your hand or foot turns pale or cold. Summary  A contusion is a deep bruise. This is a result of an injury that causes bleeding under the skin.  Symptoms of bruising include pain, swelling, and discolored skin. The skin may turn blue, purple, or yellow.  This condition is treated with rest, ice, compression, and elevation. This is also called RICE. You may be given over-the-counter medicines for pain.  Contact a doctor if you do not feel better, or you feel worse. Get  help right away if you have very bad pain, have lost feeling in a hand or foot, or the area turns pale or cold. This information is not intended to replace advice given to you by your health care provider. Make sure you discuss any questions you have with your health care provider. Document Revised: 07/26/2018 Document Reviewed: 07/26/2018 Elsevier Patient Education  [place] [facility] Many things can cause hand pain. Some common causes are:  An injury.  Repeating the same movement with your hand over and over (overuse).  Osteoporosis.  Arthritis.  Lumps in the tendons or joints of the hand and wrist (ganglion cysts).  Nerve compression syndromes (carpal tunnel syndrome).  Inflammation of the tendons (tendinitis).  Infection. Follow these instructions at home: Pay attention to any changes in your symptoms. Take these actions to help with your discomfort: Managing pain, stiffness, and swelling   Take over-the-counter and prescription medicines only as told by your health care provider.  Wear a hand splint or support as told by your health care provider.  If directed, put ice on the affected area: ? Put ice in a plastic bag. ? Place a towel between your skin and the bag. ? Leave the ice on for 20 minutes, 2-3 times a day. Activity  Take breaks from repetitive activity often.  Avoid activities that make your pain worse.  Minimize stress on your hands and wrists as much as possible.  Do stretches or exercises as told by your health care provider.  Do not do activities that make your pain worse. Contact a health care provider if:  Your pain does not get better after a few days of self-care.  Your pain gets worse.  Your pain affects your ability to do your daily activities. Get help right away if:  Your hand becomes warm, red, or swollen.  Your hand is numb or tingling.  Your hand is extremely swollen or deformed.  Your hand or fingers turn white or  blue.  You cannot move your hand, wrist, or fingers. Summary  Many things can cause hand pain.  Contact your health care provider if your pain does not get better after a few days of self care.  Minimize stress on your hands and wrists as much as possible.  Do not do activities that make your pain worse. This information is not intended to replace advice given to you by your health care provider. Make sure you discuss any questions you have with your health care provider. Document Revised: 08/30/2018 Document Reviewed: 08/30/2018 Elsevier Patient Education  [place].

## 2020-09-30 DIAGNOSIS — H5213 Myopia, bilateral: Secondary | ICD-10-CM | POA: Diagnosis not present

## 2020-10-10 ENCOUNTER — Encounter: Payer: Self-pay | Admitting: Adult Health

## 2020-10-11 ENCOUNTER — Encounter: Payer: Self-pay | Admitting: *Deleted

## 2020-10-11 ENCOUNTER — Ambulatory Visit
Admission: EM | Admit: 2020-10-11 | Discharge: 2020-10-11 | Disposition: A | Discharge status: Home / Self Care | Payer: Medicaid Other | Attending: Family Medicine | Admitting: Family Medicine

## 2020-10-11 ENCOUNTER — Ambulatory Visit: Payer: Self-pay

## 2020-10-11 DIAGNOSIS — K529 Noninfective gastroenteritis and colitis, unspecified: Secondary | ICD-10-CM

## 2020-10-11 DIAGNOSIS — R197 Diarrhea, unspecified: Secondary | ICD-10-CM

## 2020-10-11 DIAGNOSIS — J0100 Acute maxillary sinusitis, unspecified: Secondary | ICD-10-CM | POA: Diagnosis not present

## 2020-10-11 DIAGNOSIS — R6883 Chills (without fever): Secondary | ICD-10-CM | POA: Diagnosis not present

## 2020-10-11 DIAGNOSIS — R112 Nausea with vomiting, unspecified: Secondary | ICD-10-CM | POA: Diagnosis not present

## 2020-10-11 DIAGNOSIS — Z1152 Encounter for screening for COVID-19: Secondary | ICD-10-CM

## 2020-10-11 DIAGNOSIS — R52 Pain, unspecified: Secondary | ICD-10-CM

## 2020-10-11 DIAGNOSIS — R509 Fever, unspecified: Secondary | ICD-10-CM

## 2020-10-11 MED ORDER — DICYCLOMINE HCL 20 MG PO TABS: 20.0000 mg | ORAL_TABLET | Freq: Two times a day (BID) | ORAL | 0 refills | Status: DC

## 2020-10-11 MED ORDER — AZITHROMYCIN 250 MG PO TABS: 250.0000 mg | ORAL_TABLET | Freq: Every day | ORAL | 0 refills | Status: DC

## 2020-10-11 MED ORDER — ONDANSETRON HCL 4 MG PO TABS: 4.0000 mg | ORAL_TABLET | Freq: Four times a day (QID) | ORAL | 0 refills | Status: DC

## 2020-10-11 NOTE — Discharge Instructions (Addendum)
I have sent in azithromycin for you to take. Take 2 tablets today, then one tablet daily for the next 4 days.  I have sent in zofran for you to take one tablet every 6 hours as needed for nausea  I have also sent in dicyclomine for you to take twice a day as needed for abdominal cramping  Your COVID, flu and RSV tests are pending.  You should self quarantine until the test result is back.    Take Tylenol as needed for fever or discomfort.  Rest and keep yourself hydrated.    Go to the emergency department if you develop acute worsening symptoms.

## 2020-10-11 NOTE — ED Triage Notes (Signed)
Patient reports diarrhea, lower abdominal discomfort and vomiting on Saturday night--diarrhea has continued.   Patient also reports nasal congestion x 1.5 weeks.

## 2020-10-11 NOTE — ED Provider Notes (Signed)
[place]   [id number] 10/11/20 Arrival Time: 1054  CC: ABDOMINAL PAIN  SUBJECTIVE:  Amanda [name] is a 24 y.o. female who presents with complaint of abdominal discomfort that began about 3 days ago. Reports nasal congestion for the last 1.5 weeks. Denies a precipitating event, trauma, close contacts with similar symptoms, recent travel or antibiotic use. Has not attempted OTC treatment. Has not taken OTC medications for this. Denies alleviating or aggravating factors. Denies similar symptoms in the past. Last BM today.    Denies weight changes, chest pain, SOB, constipation, hematochezia, melena, dysuria, difficulty urinating, increased frequency or urgency, flank pain, loss of bowel or bladder function, vaginal discharge, vaginal odor, vaginal bleeding, dyspareunia, pelvic pain.     ROS: As per HPI.  All other pertinent ROS negative.     Past Medical History:  Diagnosis Date  . Family history of adverse reaction to anesthesia    heart condition  . Family history of breast cancer   . Family history of pancreatic cancer    8/21 cancer genetic testing letter sent  . GERD (gastroesophageal reflux disease)   . Migraine    Past Surgical History:  Procedure Laterality Date  . NO PAST SURGERIES    . WISDOM TOOTH EXTRACTION     Allergies  Allergen Reactions  . Imitrex [Sumatriptan] Swelling    Facial swelling and burning lips  . Penicillins Hives    Did it involve swelling of the face/tongue/throat, SOB, or low BP? No Did it involve sudden or severe rash/hives, skin peeling, or any reaction on the inside of your mouth or nose? Yes Did you need to seek medical attention at a hospital or doctor's office? Yes When did it last happen?childhood allergy If all above answers are "NO", may proceed with cephalosporin use.   No current facility-administered medications on file prior to encounter.   Current Outpatient Medications on File Prior to Encounter  Medication Sig  Dispense Refill  . cetirizine (ZYRTEC) 10 MG tablet Take 1 tablet (10 mg total) by mouth daily. 30 tablet 1  . ibuprofen (ADVIL) 600 MG tablet Take 1 tablet (600 mg total) by mouth every 8 (eight) hours as needed. 30 tablet 0  . norethindrone (MICRONOR) 0.35 MG tablet Take 1 tablet (0.35 mg total) by mouth daily. 30 tablet 6  . Rimegepant Sulfate (NURTEC) 75 MG TBDP Take one tablet as needed for headache. 8 tablet 11  . Rimegepant Sulfate (NURTEC) 75 MG TBDP Take 75 mg by mouth every other day. 16 tablet 11  . Rimegepant Sulfate (NURTEC) 75 MG TBDP Take 75 mg by mouth every other day. THESE ARE SAMPLES 16 tablet 0  . sertraline (ZOLOFT) 50 MG tablet Take 1 tablet (50 mg total) by mouth daily. 30 tablet 11   Social History   Socioeconomic History  . Marital status: Single    Spouse name: [name]  . Number of children: Not on file  . Years of education: Not on file  . Highest education level: Not on file  Occupational History  . Occupation: Massage Therapy  Tobacco Use  . Smoking status: Never Smoker  . Smokeless tobacco: Never Used  Vaping Use  . Vaping Use: Never used  Substance and Sexual Activity  . Alcohol use: Never  . Drug use: No  . Sexual activity: Yes  Other Topics Concern  . Not on file  Social History Narrative      Right-handed   Caffeine: occasional coffee   Social Determinants of  Health   Financial Resource Strain:   . Difficulty of Paying Living Expenses: Not on file  Food Insecurity:   . Worried About Charity fundraiser in the Last Year: Not on file  . Ran Out of Food in the Last Year: Not on file  Transportation Needs:   . Lack of Transportation (Medical): Not on file  . Lack of Transportation (Non-Medical): Not on file  Physical Activity:   . Days of Exercise per Week: Not on file  . Minutes of Exercise per Session: Not on file  Stress:   . Feeling of Stress : Not on file  Social Connections:   . Frequency of Communication with Friends and  Family: Not on file  . Frequency of Social Gatherings with Friends and Family: Not on file  . Attends Religious Services: Not on file  . Active Member of Clubs or Organizations: Not on file  . Attends Archivist Meetings: Not on file  . Marital Status: Not on file  Intimate Partner Violence:   . Fear of Current or Ex-Partner: Not on file  . Emotionally Abused: Not on file  . Physically Abused: Not on file  . Sexually Abused: Not on file   Family History  Problem Relation Age of Onset  . Migraines Mother   . Skin cancer Father   . Asthma Maternal Grandmother   . Hypertension Maternal Grandmother   . Thyroid disease Maternal Grandmother   . Hypertension Maternal Grandfather   . Pancreatic cancer Maternal Grandfather 60  . Colon cancer Maternal Grandfather 7  . Hypertension Paternal Grandmother   . Diabetes Paternal Grandmother   . Hypertension Paternal Grandfather   . Diabetes Paternal Grandfather   . Colon cancer Paternal Grandfather 54  . Thyroid disease Maternal Aunt   . Breast cancer Maternal Aunt 65  . Allergies Brother      OBJECTIVE:  Vitals:   10/11/20 1156  BP: 100/86  Pulse: 100  Resp: 16  Temp: 98.8 F (37.1 C)  TempSrc: Oral  SpO2: 99%    General appearance: Alert; NAD HEENT: NCAT.  Oropharynx erythematous, cobblestoning present. Sinuses: maxillary sinus tenderness Lungs: clear to auscultation bilaterally without adventitious breath sounds Heart: regular rate and rhythm.  Radial pulses 2+ symmetrical bilaterally Abdomen: soft, non-distended; hyperactive bowel sounds; generalized tenderness to deep palpation; nontender at McBurney's point; negative Murphy's sign; negative rebound; no guarding Back: no CVA tenderness Extremities: no edema; symmetrical with no gross deformities Skin: warm and dry Neurologic: normal gait Psychological: alert and cooperative; normal mood and affect  LABS: No results found for this or any previous visit (from  the past 24 hour(s)).  DIAGNOSTIC STUDIES: No results found.   ASSESSMENT & PLAN:  1. Acute non-recurrent maxillary sinusitis   2. Gastroenteritis   3. Nausea vomiting and diarrhea   4. Chills   5. Fever, unspecified fever cause   6. Encounter for screening for COVID-19   7. Aches     Meds ordered this encounter  Medications  . azithromycin (ZITHROMAX) 250 MG tablet    Sig: Take 1 tablet (250 mg total) by mouth daily. Take first 2 tablets together, then 1 every day until finished.    Dispense:  6 tablet    Refill:  0    Order Specific Question:   Supervising Provider    Answer:   Chase Picket [id number]  . ondansetron (ZOFRAN) 4 MG tablet    Sig: Take 1 tablet (4 mg total) by mouth every  6 (six) hours.    Dispense:  12 tablet    Refill:  0    Order Specific Question:   Supervising Provider    Answer:   Chase Picket [id number]  . dicyclomine (BENTYL) 20 MG tablet    Sig: Take 1 tablet (20 mg total) by mouth 2 (two) times daily.    Dispense:  20 tablet    Refill:  0    Order Specific Question:   Supervising Provider    Answer:   Chase Picket [id number]    Prescribed azithromycin Get rest and drink fluids Zofran prescribed.  Take as directed.   Dicyclomine prescribed Viral illness vs foodborne illness Covid and flu are also in differential DIET Instructions:  30 minutes after taking nausea medicine, begin with sips of clear liquids. If able to hold down 2 - 4 ounces for 30 minutes, begin drinking more. Increase your fluid intake to replace losses. Clear liquids only for 24 hours (water, tea, sport drinks, clear flat ginger ale or cola and juices, broth, jello, popsicles, ect). Advance to bland foods, applesauce, rice, baked or boiled chicken, ect. Avoid milk, greasy foods and anything that doesn't agree with you.  Covid, flu, and RSV swab obtained in office today.  Patient instructed to quarantine until results are back and negative.  If results are  negative, patient may resume daily schedule as tolerated once they are fever free for 24 hours without the use of antipyretic medications.  If results are positive, patient instructed to quarantine 10 days from today.  Patient instructed to follow-up with primary care with this office as needed.  Patient instructed to follow-up in the ER for trouble swallowing, trouble breathing, other concerning symptoms.  Reviewed expectations re: course of current medical issues. Questions answered. Outlined signs and symptoms indicating need for more acute intervention. Patient verbalized understanding. After Visit Summary given.   Faustino Congress, NP 10/11/20 1212

## 2020-10-12 ENCOUNTER — Encounter: Payer: Self-pay | Admitting: Adult Health

## 2020-10-12 LAB — COVID-19, FLU A+B AND RSV
Influenza A, NAA: NOT DETECTED
Influenza B, NAA: NOT DETECTED
RSV, NAA: NOT DETECTED
SARS-CoV-2, NAA: NOT DETECTED

## 2020-10-15 ENCOUNTER — Telehealth: Payer: Self-pay | Admitting: Obstetrics & Gynecology

## 2020-10-15 NOTE — Telephone Encounter (Signed)
Patient is scheduled for Monday, 10/18/20 with [facility] at 3:10

## 2020-10-15 NOTE — Telephone Encounter (Signed)
-----   Message from Gae Dry, MD sent at 10/15/2020  1:45 PM EDT ----- Regarding: Sch Virtual visit for lab results discussion w [facility]

## 2020-10-18 ENCOUNTER — Ambulatory Visit: Payer: Self-pay

## 2020-10-18 ENCOUNTER — Ambulatory Visit (INDEPENDENT_AMBULATORY_CARE_PROVIDER_SITE_OTHER): Payer: Medicaid Other | Admitting: Obstetrics & Gynecology

## 2020-10-18 ENCOUNTER — Encounter: Payer: Self-pay | Admitting: Obstetrics and Gynecology

## 2020-10-18 ENCOUNTER — Other Ambulatory Visit: Payer: Self-pay

## 2020-10-18 ENCOUNTER — Encounter: Payer: Self-pay | Admitting: Adult Health

## 2020-10-18 ENCOUNTER — Other Ambulatory Visit: Payer: Self-pay | Admitting: Obstetrics and Gynecology

## 2020-10-18 VITALS — Ht 64.0 in | Wt 150.0 lb

## 2020-10-18 DIAGNOSIS — Z809 Family history of malignant neoplasm, unspecified: Secondary | ICD-10-CM | POA: Diagnosis not present

## 2020-10-18 DIAGNOSIS — Z803 Family history of malignant neoplasm of breast: Secondary | ICD-10-CM

## 2020-10-18 NOTE — Telephone Encounter (Signed)
Patient called stating that she has been vomiting for several weeks.  She has been COVID-19 tested as negative.  She feels that she is no pregnant. Her partner has had vasectomy.  She was give medication at [facility] Monday last week which has not helped.  She has not eaten.  She does try to get fluids like water and gingerale.  She feels weak when she stands. She has vomited 2 times today.  She has diarrhea at the same time.   She voided about 4 hours ago. She will go to ER for evaluation. Care advice read to patient. She verbalized understanding. Reason for Disposition . [1] Drinking very little AND [2] dehydration suspected (e.g., no urine > 12 hours, very dry mouth, very lightheaded)  Answer Assessment - Initial Assessment Questions 1. VOMITING SEVERITY: "How many times have you vomited in the past 24 hours?"     - MILD:  1 - 2 times/day    - MODERATE: 3 - 5 times/day, decreased oral intake without significant weight loss or symptoms of dehydration    - SEVERE: 6 or more times/day, vomits everything or nearly everything, with significant weight loss, symptoms of dehydration      2 times 2. ONSET: "When did the vomiting begin?"      2 weeks ago 3. FLUIDS: "What fluids or food have you vomited up today?" "Have you been able to keep any fluids down?"    No food water and gingerale 4. ABDOMINAL PAIN: "Are your having any abdominal pain?" If yes : "How bad is it and what does it feel like?" (e.g., crampy, dull, intermittent, constant)     8-9 cramping stays 5. DIARRHEA: "Is there any diarrhea?" If Yes, ask: "How many times today?"     diarrhea 6. CONTACTS: "Is there anyone else in the family with the same symptoms?"      no 7. CAUSE: "What do you think is causing your vomiting?"    unsure 8. HYDRATION STATUS: "Any signs of dehydration?" (e.g., dry mouth [not only dry lips], too weak to stand) "When did you last urinate?"     4 hours ago barely standing up 9. OTHER SYMPTOMS: "Do you have any other  symptoms?" (e.g., fever, headache, vertigo, vomiting blood or coffee grounds, recent head injury)     Headache dizziness 10. PREGNANCY: "Is there any chance you are pregnant?" "When was your last menstrual period?"       No periods 4 weeks ago  Protocols used: [facility]

## 2020-10-18 NOTE — Progress Notes (Signed)
MyRisk testing done 9/21

## 2020-10-18 NOTE — Progress Notes (Signed)
Virtual Visit via Telephone Note  I connected with Amanda [name] on 10/18/20 at  3:10 PM EDT by telephone and verified that I am speaking with the correct person using two identifiers.  Location: Patient: Home Provider: Office   I discussed the limitations, risks, security and privacy concerns of performing an evaluation and management service by telephone and the availability of in person appointments. I also discussed with the patient that there may be a patient responsible charge related to this service. The patient expressed understanding and agreed to proceed.   History of Present Illness:   Amanda [name] is a 24 y.o. who recently underwent labwork for genetic cancer risk screening.  She presents for discussion of results and plan of management. Results revealed negative results and TC model 15% for breast cancer risk.Marland Kitchen  PMHx: She  has a past medical history of BRCA negative (09/2020), Family history of adverse reaction to anesthesia, Family history of breast cancer (09/2020), Family history of pancreatic cancer, GERD (gastroesophageal reflux disease), and Migraine. Also,  has a past surgical history that includes No past surgeries and Wisdom tooth extraction., family history includes Allergies in her brother; Asthma in her maternal grandmother; Breast cancer (age of onset: 14) in her maternal aunt; Colon cancer (age of onset: 8) in her maternal grandfather; Colon cancer (age of onset: 49) in her paternal grandfather; Diabetes in her paternal grandfather and paternal grandmother; Hypertension in her maternal grandfather, maternal grandmother, paternal grandfather, and paternal grandmother; Migraines in her mother; Pancreatic cancer (age of onset: 28) in her maternal grandfather; Skin cancer in her father; Thyroid disease in her maternal aunt and maternal grandmother.,  reports that she has never smoked. She has never used smokeless tobacco. She reports that she does not drink alcohol and does not  use drugs. Current Meds  Medication Sig  . azithromycin (ZITHROMAX) 250 MG tablet Take 1 tablet (250 mg total) by mouth daily. Take first 2 tablets together, then 1 every day until finished.  . cetirizine (ZYRTEC) 10 MG tablet Take 1 tablet (10 mg total) by mouth daily.  Marland Kitchen dicyclomine (BENTYL) 20 MG tablet Take 1 tablet (20 mg total) by mouth 2 (two) times daily.  Marland Kitchen ibuprofen (ADVIL) 600 MG tablet Take 1 tablet (600 mg total) by mouth every 8 (eight) hours as needed.  . norethindrone (MICRONOR) 0.35 MG tablet Take 1 tablet (0.35 mg total) by mouth daily.  . ondansetron (ZOFRAN) 4 MG tablet Take 1 tablet (4 mg total) by mouth every 6 (six) hours.  . Rimegepant Sulfate (NURTEC) 75 MG TBDP Take one tablet as needed for headache.  . Rimegepant Sulfate (NURTEC) 75 MG TBDP Take 75 mg by mouth every other day.  . Rimegepant Sulfate (NURTEC) 75 MG TBDP Take 75 mg by mouth every other day. THESE ARE SAMPLES  . sertraline (ZOLOFT) 50 MG tablet Take 1 tablet (50 mg total) by mouth daily.  . Also, is allergic to imitrex [sumatriptan] and penicillins..  Review of Systems  All other systems reviewed and are negative.  Observations/Objective: No exam today, due to telephone eVisit due to [facility] virus restriction on elective visits and procedures.  Prior visits reviewed along with ultrasounds/labs as indicated.  Assessment and Plan: 1. Family history of cancer Follow up discussion regarding recent genetic testing due to familial high risk factors.  Her lab genetic panel was negative.  Reassurance provided and counseling as to the pros and cons of testing was done today.  Since the current test is not perfect,  it is possible that there may be a gene mutation that current testing cannot detect, but that chance is small. It is possible that a different genetic factor, which has not yet been discovered or is not on this panel, is responsible for the cancer diagnoses in the family. Again, the likelihood of  this is low.   Most cancers happen by chance and this test, along with details of her family history, suggests that her cancer falls into this category. She is recommended to follow the cancer screening guidelines provided by her physician.  Breast cancer risk is further calculated based on the T-C model.  Even with negative gene testing, she still has a risk of 14.7% based on this calculation.  Recommendations are for yearly breast exams, mammograms, and MRI when this risk is > 20%.    Follow Up Instructions: Next PAP exam   I discussed the assessment and treatment plan with the patient. The patient was provided an opportunity to ask questions and all were answered. The patient agreed with the plan and demonstrated an understanding of the instructions.   The patient was advised to call back or seek an in-person evaluation if the symptoms worsen or if the condition fails to improve as anticipated.  I provided 15 minutes of non-face-to-face time during this encounter.   Hoyt Koch, MD

## 2020-10-19 ENCOUNTER — Emergency Department
Admission: EM | Admit: 2020-10-19 | Discharge: 2020-10-20 | Disposition: A | Discharge status: Home / Self Care | Payer: Medicaid Other | Attending: Emergency Medicine | Admitting: Emergency Medicine

## 2020-10-19 ENCOUNTER — Other Ambulatory Visit: Payer: Self-pay

## 2020-10-19 ENCOUNTER — Ambulatory Visit
Admission: EM | Admit: 2020-10-19 | Discharge: 2020-10-19 | Disposition: A | Discharge status: Other Acute Inpatient Hospital | Payer: Medicaid Other | Attending: Emergency Medicine | Admitting: Emergency Medicine

## 2020-10-19 ENCOUNTER — Emergency Department: Payer: Medicaid Other

## 2020-10-19 ENCOUNTER — Encounter: Payer: Self-pay | Admitting: Emergency Medicine

## 2020-10-19 DIAGNOSIS — R197 Diarrhea, unspecified: Secondary | ICD-10-CM | POA: Diagnosis not present

## 2020-10-19 DIAGNOSIS — R112 Nausea with vomiting, unspecified: Secondary | ICD-10-CM

## 2020-10-19 DIAGNOSIS — R1013 Epigastric pain: Secondary | ICD-10-CM

## 2020-10-19 DIAGNOSIS — R1011 Right upper quadrant pain: Secondary | ICD-10-CM

## 2020-10-19 LAB — CBC
HCT: 41.5 % (ref 36.0–46.0)
Hemoglobin: 13.9 g/dL (ref 12.0–15.0)
MCH: 27.9 pg (ref 26.0–34.0)
MCHC: 33.5 g/dL (ref 30.0–36.0)
MCV: 83.3 fL (ref 80.0–100.0)
Platelets: 293 10*3/uL (ref 150–400)
RBC: 4.98 MIL/uL (ref 3.87–5.11)
RDW: 11.7 % (ref 11.5–15.5)
WBC: 5 10*3/uL (ref 4.0–10.5)
nRBC: 0 % (ref 0.0–0.2)

## 2020-10-19 LAB — URINALYSIS, COMPLETE (UACMP) WITH MICROSCOPIC
Bacteria, UA: NONE SEEN
Bilirubin Urine: NEGATIVE
Glucose, UA: NEGATIVE mg/dL
Hgb urine dipstick: NEGATIVE
Ketones, ur: NEGATIVE mg/dL
Leukocytes,Ua: NEGATIVE
Nitrite: NEGATIVE
Protein, ur: 30 mg/dL — AB
Specific Gravity, Urine: 1.027 (ref 1.005–1.030)
pH: 8 (ref 5.0–8.0)

## 2020-10-19 LAB — COMPREHENSIVE METABOLIC PANEL
ALT: 11 U/L (ref 0–44)
AST: 17 U/L (ref 15–41)
Albumin: 4.3 g/dL (ref 3.5–5.0)
Alkaline Phosphatase: 52 U/L (ref 38–126)
Anion gap: 9 (ref 5–15)
BUN: 11 mg/dL (ref 6–20)
CO2: 28 mmol/L (ref 22–32)
Calcium: 9.2 mg/dL (ref 8.9–10.3)
Chloride: 100 mmol/L (ref 98–111)
Creatinine, Ser: 0.83 mg/dL (ref 0.44–1.00)
GFR, Estimated: >60 mL/min (ref >60)
Glucose, Bld: 96 mg/dL (ref 70–99)
Potassium: 3.8 mmol/L (ref 3.5–5.1)
Sodium: 137 mmol/L (ref 135–145)
Total Bilirubin: 0.5 mg/dL (ref 0.3–1.2)
Total Protein: 7.7 g/dL (ref 6.5–8.1)

## 2020-10-19 LAB — POCT URINALYSIS DIP (MANUAL ENTRY)
Bilirubin, UA: NEGATIVE
Blood, UA: NEGATIVE
Glucose, UA: NEGATIVE mg/dL
Leukocytes, UA: NEGATIVE
Nitrite, UA: NEGATIVE
Spec Grav, UA: 1.02 (ref 1.010–1.025)
Urobilinogen, UA: 1 E.U./dL
pH, UA: 7.5 (ref 5.0–8.0)

## 2020-10-19 LAB — POC URINE PREG, ED: Preg Test, Ur: NEGATIVE

## 2020-10-19 LAB — POCT URINE PREGNANCY: Preg Test, Ur: NEGATIVE

## 2020-10-19 LAB — LIPASE, BLOOD: Lipase: 29 U/L (ref 11–51)

## 2020-10-19 MED ORDER — MORPHINE SULFATE (PF) 2 MG/ML IV SOLN
INTRAVENOUS | Status: AC
  Administered 2020-10-19: 2 mg via INTRAVENOUS
  Filled 2020-10-19: qty 1

## 2020-10-19 MED ORDER — ONDANSETRON HCL 4 MG/2ML IJ SOLN: 4.0000 mg | Freq: Once | INTRAMUSCULAR | Status: AC

## 2020-10-19 MED ORDER — ONDANSETRON HCL 4 MG/2ML IJ SOLN
INTRAMUSCULAR | Status: AC
  Administered 2020-10-19: 4 mg via INTRAVENOUS
  Filled 2020-10-19: qty 2

## 2020-10-19 MED ORDER — MORPHINE SULFATE (PF) 2 MG/ML IV SOLN: 2.0000 mg | Freq: Once | INTRAVENOUS | Status: AC

## 2020-10-19 NOTE — ED Triage Notes (Signed)
Pt comes via POV from home with c/o RUQ pain, N/V/D for couple of weeks. Pt states several covid tests with negative results,. Pt states no relief with nay medication that has been provided.

## 2020-10-19 NOTE — Discharge Instructions (Addendum)
Go to the emergency department for evaluation of your abdominal pain, nausea, vomiting, diarrhea.
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
## 2020-10-19 NOTE — ED Notes (Signed)
Patient is being discharged from the Urgent Care and sent to the Emergency Department via personal vehicle . Per Barkley Boards NP, patient is in need of higher level of care due to Abdominal Pain (signs and symptoms). Patient is aware and verbalizes understanding of plan of care.  Vitals:   10/19/20 1212  BP: 95/65  Pulse: (!) 107  Resp: 12  Temp: 98.9 F (37.2 C)  SpO2: 97%

## 2020-10-19 NOTE — Telephone Encounter (Signed)
Patient is at [facility] now, I had a Mychart message that I responded to. We can follow up call her after UC visit if possible.

## 2020-10-19 NOTE — ED Triage Notes (Signed)
Patient c/o severe ABD pain, diarrhea, emesis x 2 weeks.   Patient states she is unable to keep any food down.   Patient stated she was tested for flu, covid-19, and RSV with a negative test at this clinic per patient statement . Patient states she was diagnosed with food poisoning.

## 2020-10-20 ENCOUNTER — Encounter: Payer: Self-pay | Admitting: Adult Health

## 2020-10-20 ENCOUNTER — Emergency Department: Payer: Medicaid Other

## 2020-10-20 MED ORDER — ONDANSETRON 4 MG PO TBDP: 4.0000 mg | ORAL_TABLET | Freq: Three times a day (TID) | ORAL | 0 refills | Status: DC | PRN

## 2020-10-20 MED ORDER — IOHEXOL 300 MG/ML  SOLN
100.0000 mL | Freq: Once | INTRAMUSCULAR | Status: AC | PRN
  Administered 2020-10-20: 100 mL via INTRAVENOUS

## 2020-10-20 MED ORDER — PANTOPRAZOLE SODIUM 20 MG PO TBEC: 20.0000 mg | DELAYED_RELEASE_TABLET | Freq: Every day | ORAL | 0 refills | Status: DC

## 2020-10-20 NOTE — ED Notes (Signed)
Pt transported to [facility] at this time. 

## 2020-10-21 ENCOUNTER — Other Ambulatory Visit: Payer: Self-pay

## 2020-10-21 ENCOUNTER — Other Ambulatory Visit: Payer: Self-pay | Admitting: Adult Health

## 2020-10-21 ENCOUNTER — Ambulatory Visit (INDEPENDENT_AMBULATORY_CARE_PROVIDER_SITE_OTHER): Payer: Medicaid Other | Admitting: Gastroenterology

## 2020-10-21 ENCOUNTER — Encounter: Payer: Self-pay | Admitting: Adult Health

## 2020-10-21 VITALS — BP 101/69 | HR 88 | Temp 98.8°F | Ht 64.0 in | Wt 147.0 lb

## 2020-10-21 DIAGNOSIS — R1033 Periumbilical pain: Secondary | ICD-10-CM

## 2020-10-21 DIAGNOSIS — R197 Diarrhea, unspecified: Secondary | ICD-10-CM | POA: Diagnosis not present

## 2020-10-21 DIAGNOSIS — K921 Melena: Secondary | ICD-10-CM

## 2020-10-21 DIAGNOSIS — R1011 Right upper quadrant pain: Secondary | ICD-10-CM

## 2020-10-21 NOTE — Progress Notes (Signed)
Jonathon Bellows MD, MRCP(U.K) [address]  [place]  [place], [place] [address]  Main: [phone]  Fax: [phone]   Gastroenterology Consultation  Referring Provider:     Sharmon Leyden* Primary Care Physician:  [name], FNP Primary Gastroenterologist:  Dr. Jonathon Bellows  Reason for Consultation:   Abdominal pain        HPI:   Amanda [name] is a 24 y.o. y/o female referred for consultation & management  by Dr. Claudina Lick, [name], FNP.     She was recently seen in the emergency room 2 days back on 10/19/2021 for right upper quadrant pain.  Pain is worse while eating.  In the emergency room she underwent an ultrasound of the right upper quadrant that demonstrated a 5 mm anterior gallbladder wall polyp.  Subsequently underwent a CT scan of the abdomen and pelvis with contrast showed features suggestive left ovarian vein reflux and pelvic venous insufficiency.  No other abnormality.  10/19/2021: Lipase normal common LFTs normal.  Hemoglobin 13.9 g.  Urine analysis negative for infection negative pregnancy test.  She was treated with pantoprazole, Zofran and discharged to follow-up with gynecology as well as gastroenterology.  The reason she is here today to see me is because she has had ongoing diarrhea for more than 3 weeks.  Began all of a sudden.  Never had GI issues in the past.  Having multiple bowel movements per day.  Also having symptoms during the night.  This morning also noticed some blood in the toilet bowl bright red in color.  Her father also has GI issues but has not been evaluated.  Poor appetite.  Some nonsteroidal anti-inflammatory drug use about 3 weeks back for a few days for migraines.  Denies any joint pains.  Denies any skin rashes.  Abdominal pain is central and associated with bowel movements.  Past Medical History:  Diagnosis Date  . BRCA negative 09/2020   MyRisk neg except AXIN2 VUS  . Family history of adverse reaction to anesthesia     heart condition  . Family history of breast cancer 09/2020   IBIS=15.7%/riskscore=14.7%  . Family history of pancreatic cancer   . GERD (gastroesophageal reflux disease)   . Migraine     Past Surgical History:  Procedure Laterality Date  . NO PAST SURGERIES    . WISDOM TOOTH EXTRACTION      Prior to Admission medications   Medication Sig Start Date End Date Taking? Authorizing Provider  azithromycin (ZITHROMAX) 250 MG tablet Take 1 tablet (250 mg total) by mouth daily. Take first 2 tablets together, then 1 every day until finished. 10/11/20   Faustino Congress, NP  cetirizine (ZYRTEC) 10 MG tablet Take 1 tablet (10 mg total) by mouth daily. 06/28/20   [name], [name], FNP  dicyclomine (BENTYL) 20 MG tablet Take 1 tablet (20 mg total) by mouth 2 (two) times daily. 10/11/20   Faustino Congress, NP  ibuprofen (ADVIL) 600 MG tablet Take 1 tablet (600 mg total) by mouth every 8 (eight) hours as needed. 09/28/20   [name], [name], FNP  norethindrone (MICRONOR) 0.35 MG tablet Take 1 tablet (0.35 mg total) by mouth daily. 09/28/20   Gae Dry, MD  ondansetron (ZOFRAN ODT) 4 MG disintegrating tablet Take 1 tablet (4 mg total) by mouth every 8 (eight) hours as needed. 10/20/20   Gregor Hams, MD  ondansetron (ZOFRAN) 4 MG tablet Take 1 tablet (4 mg total) by mouth every 6 (six)  hours. 10/11/20   Faustino Congress, NP  pantoprazole (PROTONIX) 20 MG tablet Take 1 tablet (20 mg total) by mouth daily. 10/20/20 11/19/20  Gregor Hams, MD  Rimegepant Sulfate (NURTEC) 75 MG TBDP Take one tablet as needed for headache. 08/12/20   Jerrol Banana., MD  sertraline (ZOLOFT) 50 MG tablet Take 1 tablet (50 mg total) by mouth daily. 07/20/20   Gae Dry, MD    Family History  Problem Relation Age of Onset  . Migraines Mother   . Skin cancer Father   . Asthma Maternal Grandmother   . Hypertension Maternal Grandmother   . Thyroid disease Maternal Grandmother   .  Hypertension Maternal Grandfather   . Pancreatic cancer Maternal Grandfather 31  . Colon cancer Maternal Grandfather 77  . Hypertension Paternal Grandmother   . Diabetes Paternal Grandmother   . Hypertension Paternal Grandfather   . Diabetes Paternal Grandfather   . Colon cancer Paternal Grandfather 26  . Thyroid disease Maternal Aunt   . Breast cancer Maternal Aunt 70  . Allergies Brother      Social History   Tobacco Use  . Smoking status: Never Smoker  . Smokeless tobacco: Never Used  Vaping Use  . Vaping Use: Never used  Substance Use Topics  . Alcohol use: Never  . Drug use: No    Allergies as of 10/21/2020 - Review Complete 10/20/2020  Allergen Reaction Noted  . Imitrex [sumatriptan] Swelling 08/15/2016  . Penicillins Hives 03/09/2011    Review of Systems:    All systems reviewed and negative except where noted in HPI.   Physical Exam:  LMP 09/21/2020  Patient's last menstrual period was 09/21/2020. Psych:  Alert and cooperative. Normal mood and affect. General:   Alert,  Well-developed, well-nourished, pleasant and cooperative in NAD Head:  Normocephalic and atraumatic. Eyes:  Sclera clear, no icterus.   Conjunctiva pink. Ears:  Normal auditory acuity. Lungs:  Respirations even and unlabored.  Clear throughout to auscultation.   No wheezes, crackles, or rhonchi. No acute distress. Heart:  Regular rate and rhythm; no murmurs, clicks, rubs, or gallops. Abdomen:  Normal bowel sounds.  No bruits.  Soft, mild tenderness in the central part of the abdomen and non-distended without masses, hepatosplenomegaly or hernias noted.  No guarding or rebound tenderness.    Neurologic:  Alert and oriented x3;  grossly normal neurologically. Psych:  Alert and cooperative. Normal mood and affect.  Imaging Studies: US PELVIS TRANSVAGINAL NON-OB (TV ONLY)  Result Date: 09/28/2020 Patient Name: Amanda [name] DOB: 1996-06-21 MRN: [id number] ULTRASOUND REPORT Location: [facility]  Date of Service: 09/28/2020 Indications:Pelvic Pain Findings: The uterus is anteverted and measures 7.8 x 5.7 x 3.8 cm. Echo texture is homogenous without evidence of focal masses. The Endometrium measures 9.6 mm. Right Ovary measures 3.7 x 2.4 x 2.2 cm. It is normal in appearance. Left Ovary measures 3.3 x 2.2 x 1.5 cm. It is normal in appearance. Survey of the adnexa demonstrates no adnexal masses. There is no free fluid in the cul de sac. Impression: 1. Normal pelvic ultrasound. Recommendations: 1.Clinical correlation with the patient's History and Physical Exam. Gweneth Dimitri, RT Review of ULTRASOUND.    I have personally reviewed images and report of recent ultrasound done at [facility].    Plan of management to be discussed with patient. Barnett Applebaum, MD, [facility], [facility] 09/28/2020  3:41 PM  CT ABDOMEN PELVIS W CONTRAST  Result Date: 10/20/2020 CLINICAL DATA:  Unspecified abdominal pain, nausea, vomiting, diarrhea EXAM: CT ABDOMEN AND PELVIS WITH CONTRAST TECHNIQUE: Multidetector CT imaging of the abdomen and pelvis was performed using the standard protocol following bolus administration of intravenous contrast. CONTRAST:  100mL OMNIPAQUE IOHEXOL 300 MG/ML  SOLN COMPARISON:  None. FINDINGS: Lower chest: The visualized lung bases are clear bilaterally. Visualized heart and pericardium are unremarkable. Hepatobiliary: No focal liver abnormality is seen. No gallstones, gallbladder wall thickening, or biliary dilatation. Pancreas: Unremarkable Spleen: Unremarkable Adrenals/Urinary Tract: Adrenal glands are unremarkable. Kidneys are normal, without renal calculi, focal lesion, or hydronephrosis. Bladder is unremarkable. Stomach/Bowel: Stomach is within normal limits. Appendix appears normal. No evidence of bowel wall thickening, distention, or inflammatory changes. No free intraperitoneal gas or fluid Vascular/Lymphatic: The left ovarian vein is dilated measuring up to 7 mm in  diameter with extensive adnexal varices identified suggesting changes of ovarian vein reflux and pelvic venous insufficiency. The abdominal vasculature is otherwise unremarkable. No pathologic adenopathy within the abdomen and pelvis. Reproductive: Uterus and bilateral adnexa are unremarkable. Other: Moderate stool within the rectal vault. The rectum is otherwise unremarkable. Tiny fat containing umbilical hernia. Musculoskeletal: No acute bone abnormality. IMPRESSION: No radiographic explanation for the patient's reported symptoms. Findings suggestive of left ovarian vein reflux and pelvic venous insufficiency. Electronically Signed   By: Ashesh  Parikh MD   On: 10/20/2020 01:03   US ABDOMEN LIMITED RUQ (LIVER/GB)  Result Date: 10/20/2020 CLINICAL DATA:  Right upper quadrant pain EXAM: ULTRASOUND ABDOMEN LIMITED RIGHT UPPER QUADRANT COMPARISON:  None. FINDINGS: Gallbladder: 5 mm echogenic non mobile nonshadowing focus along the anterior gallbladder wall compatible with polyp. No visible stones or wall thickening. Common bile duct: Diameter: Normal caliber, 3 mm Liver: No focal lesion identified. Within normal limits in parenchymal echogenicity. Portal vein is patent on color Doppler imaging with normal direction of blood flow towards the liver. Other: None. IMPRESSION: 5 mm anterior gallbladder wall polyp. No acute findings. Electronically Signed   By: Kevin  Dover M.D.   On: 10/20/2020 00:40    Assessment and Plan:   Maudean Gamblin is a 24 y.o. y/o female has been referred for right upper quadrant pain.  Presented to the emergency room 2 days back with right upper quadrant urine.  Ultrasound of the liver demonstrated 5 mm gallbladder polyp.  LFTs were normal.  Of short-term ovarian vein reflux which has been referred to gynecological for the same.  Main issue today is diarrhea with some rectal bleeding ongoing for at least 3 weeks.  Some fever when it began.  Remote NSAID use.  Differentials include  infectious diarrhea versus inflammatory bowel disease.  Plan Check stool for GI PCR, C. difficile, fecal calprotectin.  If negative will need either a sigmoidoscopy or colonoscopy to evaluate colonic mucosa.  Stop all NSAID use.    Follow up in 1 week telephone visit  Dr Kiran Anna MD,MRCP(U.K)  

## 2020-10-22 ENCOUNTER — Encounter: Payer: Self-pay | Admitting: Adult Health

## 2020-10-22 ENCOUNTER — Ambulatory Visit: Payer: Self-pay | Admitting: *Deleted

## 2020-10-22 NOTE — Telephone Encounter (Signed)
I sent her [facility] message, but was not aware she took Pepto, advise to avoid Pepto, NSAIDS, and monitor. Increase hydration with water.  If abdominal pain worsens or black stools persist, go to the ER over the weekend. Likely side effect to medication.

## 2020-10-22 NOTE — Telephone Encounter (Signed)
Please advise 

## 2020-10-22 NOTE — Telephone Encounter (Signed)
Noted  

## 2020-10-22 NOTE — Telephone Encounter (Signed)
Per initial encounter, "Patient has black stool and wants recommendations. Please advise"; contacted pt regarding her concerns; she was seen by GI on 10/21/20 and was to take a stool sample but the office was closed today; the pt says she took 15 ml of Pepto Bismul at 2000 10/21/20 x 1 dose; explained to pt a side of the med is darkened stool; recommendations made per nurse triage protocol; she verbalized understanding; the pt is seen by Laverna Peace, [facility]; will route to office for notification.  Reason for Disposition  Unusual stool color probably from food or medicine  Answer Assessment - Initial Assessment Questions 1. COLOR: "What color is it?" "Is that color in part or all of the stool?"     Black like tar 2. ONSET: "When was the unusual color first noted?"    0700 10/22/20 3. CAUSE: "Have you eaten any food or taken any medicine of this color?" (See listing in BACKGROUND)     Yes pepto bismul 10/21/20 at 2000 4. OTHER SYMPTOMS: "Do you have any other symptoms?" (e.g., diarrhea, jaundice, abdominal pain, fever).     Abdominal pain rated 2 out of 10  Protocols used: [place]

## 2020-10-22 NOTE — Telephone Encounter (Signed)
Patient has been advised of message as below. KW

## 2020-10-25 ENCOUNTER — Other Ambulatory Visit: Payer: Self-pay

## 2020-10-25 DIAGNOSIS — K921 Melena: Secondary | ICD-10-CM

## 2020-10-25 MED ORDER — OMEPRAZOLE 40 MG PO CPDR: 40.0000 mg | DELAYED_RELEASE_CAPSULE | Freq: Two times a day (BID) | ORAL | 2 refills | Status: DC

## 2020-10-26 ENCOUNTER — Telehealth: Payer: Self-pay

## 2020-10-26 LAB — CBC
Hematocrit: 41.1 % (ref 34.0–46.6)
Hemoglobin: 13.6 g/dL (ref 11.1–15.9)
MCH: 27.6 pg (ref 26.6–33.0)
MCHC: 33.1 g/dL (ref 31.5–35.7)
MCV: 83 fL (ref 79–97)
Platelets: 351 10*3/uL (ref 150–450)
RBC: 4.93 x10E6/uL (ref 3.77–5.28)
RDW: 11.9 % (ref 11.7–15.4)
WBC: 7.4 10*3/uL (ref 3.4–10.8)

## 2020-10-26 NOTE — Telephone Encounter (Signed)
-----   Message from Jonathon Bellows, MD sent at 10/26/2020  8:34 AM EST ----- Hb normal

## 2020-10-27 ENCOUNTER — Telehealth (INDEPENDENT_AMBULATORY_CARE_PROVIDER_SITE_OTHER): Payer: Medicaid Other | Admitting: Gastroenterology

## 2020-10-27 DIAGNOSIS — R1033 Periumbilical pain: Secondary | ICD-10-CM

## 2020-10-27 DIAGNOSIS — K921 Melena: Secondary | ICD-10-CM | POA: Diagnosis not present

## 2020-10-27 DIAGNOSIS — R197 Diarrhea, unspecified: Secondary | ICD-10-CM | POA: Diagnosis not present

## 2020-10-27 NOTE — Progress Notes (Signed)
Amanda [name] , MD [address]  [place]  [place], [place] [address]  Main: [phone]  Fax: [phone]   Primary Care Physician: [name], FNP  Virtual Visit via Video Note  I connected with patient on 10/27/20 at  8:30 AM EST by video and verified that I am speaking with the correct person using two identifiers.   I discussed the limitations, risks, security and privacy concerns of performing an evaluation and management service by video  and the availability of in person appointments. I also discussed with the patient that there may be a patient responsible charge related to this service. The patient expressed understanding and agreed to proceed.  Location of Patient: Home Location of Provider: Home Persons involved: Patient and provider only   History of Present Illness:  Diarrhea follow-up  HPI: Amanda [name] is a 24 y.o. female  Summary of history :  She was initially referred and seen on 10/21/2020 for abdominal pain. She was seen in the emergency room 2 days prior on 10/19/2020 for right upper quadrant pain with the pain was while eating.In the emergency room she underwent an ultrasound of the right upper quadrant that demonstrated a 5 mm anterior gallbladder wall polyp.  Subsequently underwent a CT scan of the abdomen and pelvis with contrast showed features suggestive left ovarian vein reflux and pelvic venous insufficiency.  No other abnormality.  10/19/2021: Lipase normal,LFTs normal.  Hemoglobin 13.9 g.  Urine analysis negative for infection negative pregnancy test.  She was treated with pantoprazole, Zofran and discharged to follow-up with gynecology as well as gastroenterology.  At her initial visit she said that she had had ongoing diarrhea for more than 3 weeks.  Began all of a sudden.  Never had GI issues in the past.  Having multiple bowel movements per day.  Also having symptoms during the night.  This morning also noticed some blood in the  toilet bowl bright red in color.  Her father also has GI issues but has not been evaluated.  Poor appetite.  Some nonsteroidal anti-inflammatory drug use about 3 weeks back for a few days for migraines.  Denies any joint pains.  Denies any skin rashes.  Abdominal pain is central and associated with bowel movements.   Interval history   10/21/2020-10/27/2020  10/21/2020: Order GI PCR of the stool did not obtain it. She called and informed that she was having black-colored stool and I advised her to get a CBC and hemoglobin demonstrated was 13.6 g. She then informed me that she was taking Pepto-Bismol and I explained to her that is likely the reason why she has had black-colored stool.  The visit was initially put in as a video visit but subsequently changed to a telephone visit.  She states that the diarrhea has resolved still has some abdominal discomfort.  No other complaints. Current Outpatient Medications  Medication Sig Dispense Refill  . azithromycin (ZITHROMAX) 250 MG tablet Take 1 tablet (250 mg total) by mouth daily. Take first 2 tablets together, then 1 every day until finished. (Patient not taking: Reported on 10/21/2020) 6 tablet 0  . cetirizine (ZYRTEC) 10 MG tablet Take 1 tablet (10 mg total) by mouth daily. 30 tablet 1  . dicyclomine (BENTYL) 20 MG tablet Take 1 tablet (20 mg total) by mouth 2 (two) times daily. (Patient not taking: Reported on 10/21/2020) 20 tablet 0  . ibuprofen (ADVIL) 600 MG tablet Take 1 tablet (600 mg total) by mouth every 8 (  eight) hours as needed. (Patient not taking: Reported on 10/21/2020) 30 tablet 0  . norethindrone (MICRONOR) 0.35 MG tablet Take 1 tablet (0.35 mg total) by mouth daily. (Patient not taking: Reported on 10/21/2020) 30 tablet 6  . omeprazole (PRILOSEC) 40 MG capsule Take 1 capsule (40 mg total) by mouth in the morning and at bedtime. 60 capsule 2  . ondansetron (ZOFRAN ODT) 4 MG disintegrating tablet Take 1 tablet (4 mg total) by mouth every 8  (eight) hours as needed. 20 tablet 0  . ondansetron (ZOFRAN) 4 MG tablet Take 1 tablet (4 mg total) by mouth every 6 (six) hours. 12 tablet 0  . Rimegepant Sulfate (NURTEC) 75 MG TBDP Take one tablet as needed for headache. 8 tablet 11  . sertraline (ZOLOFT) 50 MG tablet Take 1 tablet (50 mg total) by mouth daily. 30 tablet 11   No current facility-administered medications for this visit.    Allergies as of 10/27/2020 - Review Complete 10/21/2020  Allergen Reaction Noted  . Imitrex [sumatriptan] Swelling 08/15/2016  . Penicillins Hives 03/09/2011    Review of Systems:    All systems reviewed and negative except where noted in HPI.  General Appearance:    Alert, cooperative, no distress, appears stated age  Head:    Normocephalic, without obvious abnormality, atraumatic  Eyes:    PERRL, conjunctiva/corneas clear,  Ears:    Grossly normal hearing    Neurologic:  Grossly normal    Observations/Objective:  Labs: CMP     Component Value Date/Time   NA 137 10/19/2020 1401   NA 139 06/28/2020 0930   K 3.8 10/19/2020 1401   CL 100 10/19/2020 1401   CO2 28 10/19/2020 1401   GLUCOSE 96 10/19/2020 1401   BUN 11 10/19/2020 1401   BUN 14 06/28/2020 0930   CREATININE 0.83 10/19/2020 1401   CALCIUM 9.2 10/19/2020 1401   PROT 7.7 10/19/2020 1401   PROT 7.3 06/28/2020 0930   ALBUMIN 4.3 10/19/2020 1401   ALBUMIN 4.7 06/28/2020 0930   AST 17 10/19/2020 1401   ALT 11 10/19/2020 1401   ALKPHOS 52 10/19/2020 1401   BILITOT 0.5 10/19/2020 1401   BILITOT 0.7 06/28/2020 0930   GFRNONAA >60 10/19/2020 1401   GFRAA 131 06/28/2020 0930   Lab Results  Component Value Date   WBC 7.4 10/25/2020   HGB 13.6 10/25/2020   HCT 41.1 10/25/2020   MCV 83 10/25/2020   PLT 351 10/25/2020    Imaging Studies: US PELVIS TRANSVAGINAL NON-OB (TV ONLY)  Result Date: 09/28/2020 Patient Name: Amanda [name] DOB: [birth year] MRN: [id number] ULTRASOUND REPORT Location: [facility] Date of Service:  09/28/2020 Indications:Pelvic Pain Findings: The uterus is anteverted and measures 7.8 x 5.7 x 3.8 cm. Echo texture is homogenous without evidence of focal masses. The Endometrium measures 9.6 mm. Right Ovary measures 3.7 x 2.4 x 2.2 cm. It is normal in appearance. Left Ovary measures 3.3 x 2.2 x 1.5 cm. It is normal in appearance. Survey of the adnexa demonstrates no adnexal masses. There is no free fluid in the cul de sac. Impression: 1. Normal pelvic ultrasound. Recommendations: 1.Clinical correlation with the patient's History and Physical Exam. Gweneth Dimitri, RT Review of ULTRASOUND.    I have personally reviewed images and report of recent ultrasound done at [facility].    Plan of management to be discussed with patient. Barnett Applebaum, MD, [facility], [facility] 09/28/2020  3:41 PM  CT ABDOMEN PELVIS W CONTRAST  Result  Date: 10/20/2020 CLINICAL DATA:  Unspecified abdominal pain, nausea, vomiting, diarrhea EXAM: CT ABDOMEN AND PELVIS WITH CONTRAST TECHNIQUE: Multidetector CT imaging of the abdomen and pelvis was performed using the standard protocol following bolus administration of intravenous contrast. CONTRAST:  159mL OMNIPAQUE IOHEXOL 300 MG/ML  SOLN COMPARISON:  None. FINDINGS: Lower chest: The visualized lung bases are clear bilaterally. Visualized heart and pericardium are unremarkable. Hepatobiliary: No focal liver abnormality is seen. No gallstones, gallbladder wall thickening, or biliary dilatation. Pancreas: Unremarkable Spleen: Unremarkable Adrenals/Urinary Tract: Adrenal glands are unremarkable. Kidneys are normal, without renal calculi, focal lesion, or hydronephrosis. Bladder is unremarkable. Stomach/Bowel: Stomach is within normal limits. Appendix appears normal. No evidence of bowel wall thickening, distention, or inflammatory changes. No free intraperitoneal gas or fluid Vascular/Lymphatic: The left ovarian vein is dilated measuring up to 7 mm in diameter with  extensive adnexal varices identified suggesting changes of ovarian vein reflux and pelvic venous insufficiency. The abdominal vasculature is otherwise unremarkable. No pathologic adenopathy within the abdomen and pelvis. Reproductive: Uterus and bilateral adnexa are unremarkable. Other: Moderate stool within the rectal vault. The rectum is otherwise unremarkable. Tiny fat containing umbilical hernia. Musculoskeletal: No acute bone abnormality. IMPRESSION: No radiographic explanation for the patient's reported symptoms. Findings suggestive of left ovarian vein reflux and pelvic venous insufficiency. Electronically Signed   By: Fidela Salisbury MD   On: 10/20/2020 01:03   US ABDOMEN LIMITED RUQ (LIVER/GB)  Result Date: 10/20/2020 CLINICAL DATA:  Right upper quadrant pain EXAM: ULTRASOUND ABDOMEN LIMITED RIGHT UPPER QUADRANT COMPARISON:  None. FINDINGS: Gallbladder: 5 mm echogenic non mobile nonshadowing focus along the anterior gallbladder wall compatible with polyp. No visible stones or wall thickening. Common bile duct: Diameter: Normal caliber, 3 mm Liver: No focal lesion identified. Within normal limits in parenchymal echogenicity. Portal vein is patent on color Doppler imaging with normal direction of blood flow towards the liver. Other: None. IMPRESSION: 5 mm anterior gallbladder wall polyp. No acute findings. Electronically Signed   By: Rolm Baptise M.D.   On: 10/20/2020 00:40    Assessment and Plan:   Keary Hanak is a 24 y.o. y/o female here to follow-up for right upper quadrant pain.  Presented to the emergency room 2 days back with right upper quadrant urine.  Ultrasound of the liver demonstrated 5 mm gallbladder polyp.  LFTs were normal. At her initial visit she said that she had some rectal bleeding ongoing for at least 3 weeks.    Diarrhea and rectal bleeding has resolved at this point of time.  Some residual abdominal discomfort.  Plan 1. Gallbladder polyp repeat ultrasound of the gallbladder  in 12 months to check for stability.  2.  Check H. pylori stool antigen. 3.  Follow-up in 4 weeks to check and see how her rectal bleeding as well as abdominal pain is doing.  Based on the results may discuss colonoscopy if warranted.     I discussed the assessment and treatment plan with the patient. The patient was provided an opportunity to ask questions and all were answered. The patient agreed with the plan and demonstrated an understanding of the instructions.   The patient was advised to call back or seek an in-person evaluation if the symptoms worsen or if the condition fails to improve as anticipated.  I provided 11 minutes of face-to-face time during this encounter.  Dr Amanda Bellows MD,MRCP [facility]) Gastroenterology/Hepatology Pager: [phone]   Speech recognition software was used to dictate this note.

## 2020-10-28 ENCOUNTER — Telehealth: Payer: Self-pay

## 2020-10-28 ENCOUNTER — Encounter: Payer: Self-pay | Admitting: Gastroenterology

## 2020-10-28 LAB — GI PROFILE, STOOL, PCR

## 2020-10-28 NOTE — Progress Notes (Signed)
Inform stool test detected a virus , usually clears itself on its own, since she has no diarrhea , it is almost certainly cleared. Nothing more to do about it

## 2020-10-28 NOTE — Telephone Encounter (Signed)
-----   Message from Jonathon Bellows, MD sent at 10/28/2020  9:34 AM EST ----- Inform stool test detected a virus , usually clears itself on its own, since she has no diarrhea , it is almost certainly cleared. Nothing more to do about it

## 2020-11-04 ENCOUNTER — Encounter: Payer: Self-pay | Admitting: Obstetrics & Gynecology

## 2020-11-04 ENCOUNTER — Telehealth (INDEPENDENT_AMBULATORY_CARE_PROVIDER_SITE_OTHER): Payer: Medicaid Other | Admitting: Obstetrics & Gynecology

## 2020-11-04 ENCOUNTER — Other Ambulatory Visit: Payer: Self-pay

## 2020-11-04 DIAGNOSIS — N9489 Other specified conditions associated with female genital organs and menstrual cycle: Secondary | ICD-10-CM

## 2020-11-04 NOTE — Progress Notes (Signed)
Virtual Visit via Video Note  I connected with Amanda [name] on 11/04/20 at  3:50 PM EST by a video enabled telemedicine application and verified that I am speaking with the correct person using two identifiers.  Location: Patient: Home Provider: Office   I discussed the limitations of evaluation and management by telemedicine and the availability of in person appointments. The patient expressed understanding and agreed to proceed.  History of Present Illness: Amanda [name] is a 24 y.o. who recently underwent CT for other reasons, and was found to have a dilated left ovarian vein.  She denies chronic pelvic pain, left sided pain, h/o endometriosis, bleeding concerns.  She is s/p twin delivery 11 mos ago.  PMHx: She  has a past medical history of BRCA negative (09/2020), Family history of adverse reaction to anesthesia, Family history of breast cancer (09/2020), Family history of pancreatic cancer, GERD (gastroesophageal reflux disease), and Migraine. Also,  has a past surgical history that includes No past surgeries and Wisdom tooth extraction., family history includes Allergies in her brother; Asthma in her maternal grandmother; Breast cancer (age of onset: 10) in her maternal aunt; Colon cancer (age of onset: 48) in her maternal grandfather; Colon cancer (age of onset: 81) in her paternal grandfather; Diabetes in her paternal grandfather and paternal grandmother; Hypertension in her maternal grandfather, maternal grandmother, paternal grandfather, and paternal grandmother; Migraines in her mother; Pancreatic cancer (age of onset: 36) in her maternal grandfather; Skin cancer in her father; Thyroid disease in her maternal aunt and maternal grandmother.,  reports that she has never smoked. She has never used smokeless tobacco. She reports that she does not drink alcohol and does not use drugs. Current Outpatient Medications  Medication Instructions  . azithromycin (ZITHROMAX) 250 mg, Oral, Daily, Take  first 2 tablets together, then 1 every day until finished.  . cetirizine (ZYRTEC) 10 mg, Oral, Daily  . dicyclomine (BENTYL) 20 mg, Oral, 2 times daily  . ibuprofen (ADVIL) 600 mg, Oral, Every 8 hours PRN  . norethindrone (MICRONOR) 0.35 mg, Oral, Daily  . omeprazole (PRILOSEC) 40 mg, Oral, 2 times daily  . ondansetron (ZOFRAN ODT) 4 mg, Oral, Every 8 hours PRN  . ondansetron (ZOFRAN) 4 mg, Oral, Every 6 hours  . Rimegepant Sulfate (NURTEC) 75 MG TBDP Take one tablet as needed for headache.  . sertraline (ZOLOFT) 50 mg, Oral, Daily  Also, is allergic to imitrex [sumatriptan] and penicillins..  Review of Systems  All other systems reviewed and are negative.    Observations/Objective: No exam today, due to telephone eVisit due to [facility] virus restriction on elective visits and procedures.  Prior visits reviewed along with ultrasounds/labs as indicated.  Assessment and Plan: 1. Female pelvic congestion syndrome No pain or sx's from it, so likely incidental finding  Follow Up Instructions: Monitor for CPP   I discussed the assessment and treatment plan with the patient. The patient was provided an opportunity to ask questions and all were answered. The patient agreed with the plan and demonstrated an understanding of the instructions.   The patient was advised to call back or seek an in-person evaluation if the symptoms worsen or if the condition fails to improve as anticipated.  A total of 20 minutes were spent face-to-face with the patient as well as preparation, review, communication, and documentation during this encounter.   Barnett Applebaum, MD, [facility], [facility] 11/04/2020  4:39 PM

## 2020-11-29 NOTE — Progress Notes (Signed)
NEUROLOGY CONSULTATION NOTE  Amanda [name] MRN: [id number] DOB: [birth year]  Referring provider: Laverna Peace, FNP Primary care provider: Laverna Peace, FNP  Reason for consult:  migraines   Subjective:  Amanda [name] is a 24 year old right-handed female who presents for migraines.  History supplemented by prior neurologist's notes.  Migraines since teenager.  They are bifrontal/facial pressure with photophobia, phonophobia, dizziness and sometimes nausea.  In February 2018, she had a migraine where she lost vision in the left eye.  MRI of brain with and without contrast on 01/29/2017 personally reviewed was normal.  They would last 4-5 hours and occur every 2 to 3 days.. Every 2 to 3 days, not as severe, 4 to 5 hours.  Improved during pregnancy in 2020 but became worse following birth of her twins.  Since late 2020, they are daily, lasting 4-5 hours (but within 30 minutes with Nurtec).  They are moderate in the morning and gradually become more severe later in the day with worsening dizziness.  She takes Tylenol once a day to every other day.    Takes Tylenol after lunch once daily or every other day. Current NSAIDS/analgesics:  Tylenol Current triptans:  none Current ergotamine:  none Current anti-emetic:  none Current muscle relaxants:  none Current Antihypertensive medications:  none Current Antidepressant medications:  Sertraline 30m daily Current Anticonvulsant medications:  none Current anti-CGRP:  Nurtec PRN (EVERY OTHER DAY CAUSED STOMACH PAIN) Current Vitamins/Herbal/Supplements:  none Current Antihistamines/Decongestants:  Zyrtec Other therapy:  none Hormone/birth control:  none   Past NSAIDS/analgesics:  Ibuprofen (contraindicated - stomach), Excedrin Past abortive triptans:  Sumatriptan (lip and tongue swelling), rizatriptan (drowsiness) Past abortive ergotamine:  none Past muscle relaxants:  none Past anti-emetic:  Zofran 470mPast antihypertensive  medications:  none Past antidepressant medications:  Amitriptyline (drowsiness) Past anticonvulsant medications:  none Past anti-CGRP:  none Past vitamins/Herbal/Supplements:  none Past antihistamines/decongestants:  none  Caffeine:  No coffee.  Sometimes a Red Bull Diet:  Hydrates with water.  Tries not to skip meals.  Occasional soda Exercise:  Walks routine Depression:  improved; Anxiety:  no Other pain:  no Sleep hygiene:  At least 6-7 hours Family history of headache:  Mom (chronic migraines)   PAST MEDICAL HISTORY: Past Medical History:  Diagnosis Date   BRCA negative 09/2020   MyRisk neg except AXIN2 VUS   Family history of adverse reaction to anesthesia    heart condition   Family history of breast cancer 09/2020   IBIS=15.7%/riskscore=14.7%   Family history of pancreatic cancer    GERD (gastroesophageal reflux disease)    Migraine     PAST SURGICAL HISTORY: Past Surgical History:  Procedure Laterality Date   NO PAST SURGERIES     WISDOM TOOTH EXTRACTION      MEDICATIONS: Current Outpatient Medications on File Prior to Visit  Medication Sig Dispense Refill   azithromycin (ZITHROMAX) 250 MG tablet Take 1 tablet (250 mg total) by mouth daily. Take first 2 tablets together, then 1 every day until finished. (Patient not taking: Reported on 10/21/2020) 6 tablet 0   cetirizine (ZYRTEC) 10 MG tablet Take 1 tablet (10 mg total) by mouth daily. 30 tablet 1   dicyclomine (BENTYL) 20 MG tablet Take 1 tablet (20 mg total) by mouth 2 (two) times daily. (Patient not taking: Reported on 10/21/2020) 20 tablet 0   ibuprofen (ADVIL) 600 MG tablet Take 1 tablet (600 mg total) by mouth every 8 (eight) hours as needed. (Patient not taking: Reported on  10/21/2020) 30 tablet 0   norethindrone (MICRONOR) 0.35 MG tablet Take 1 tablet (0.35 mg total) by mouth daily. (Patient not taking: Reported on 10/21/2020) 30 tablet 6   omeprazole (PRILOSEC) 40 MG capsule Take 1 capsule (40  mg total) by mouth in the morning and at bedtime. 60 capsule 2   ondansetron (ZOFRAN ODT) 4 MG disintegrating tablet Take 1 tablet (4 mg total) by mouth every 8 (eight) hours as needed. 20 tablet 0   ondansetron (ZOFRAN) 4 MG tablet Take 1 tablet (4 mg total) by mouth every 6 (six) hours. 12 tablet 0   Rimegepant Sulfate (NURTEC) 75 MG TBDP Take one tablet as needed for headache. 8 tablet 11   sertraline (ZOLOFT) 50 MG tablet Take 1 tablet (50 mg total) by mouth daily. 30 tablet 11   No current facility-administered medications on file prior to visit.    ALLERGIES: Allergies  Allergen Reactions   Imitrex [Sumatriptan] Swelling    Facial swelling and burning lips   Penicillins Hives    Did it involve swelling of the face/tongue/throat, SOB, or low BP? No Did it involve sudden or severe rash/hives, skin peeling, or any reaction on the inside of your mouth or nose? Yes Did you need to seek medical attention at a hospital or doctor's office? Yes When did it last happen?childhood allergy If all above answers are NO, may proceed with cephalosporin use.    FAMILY HISTORY: Family History  Problem Relation Age of Onset   Migraines Mother    Skin cancer Father    Asthma Maternal Grandmother    Hypertension Maternal Grandmother    Thyroid disease Maternal Grandmother    Hypertension Maternal Grandfather    Pancreatic cancer Maternal Grandfather 6   Colon cancer Maternal Grandfather 60   Hypertension Paternal Grandmother    Diabetes Paternal Grandmother    Hypertension Paternal Grandfather    Diabetes Paternal Grandfather    Colon cancer Paternal Grandfather 39   Thyroid disease Maternal Aunt    Breast cancer Maternal Aunt 10   Allergies Brother      SOCIAL HISTORY: Social History   Socioeconomic History   Marital status: Single    Spouse name: Physicist, medical   Number of children: Not on file   Years of education: Not on file   Highest  education level: Not on file  Occupational History   Occupation: Massage Therapy  Tobacco Use   Smoking status: Never Smoker   Smokeless tobacco: Never Used  Scientific laboratory technician Use: Never used  Substance and Sexual Activity   Alcohol use: Never   Drug use: No   Sexual activity: Yes  Other Topics Concern   Not on file  Social History Narrative      Right-handed   Caffeine: occasional coffee   Social Determinants of Radio broadcast assistant Strain: Not on file  Food Insecurity: Not on file  Transportation Needs: Not on file  Physical Activity: Not on file  Stress: Not on file  Social Connections: Not on file  Intimate Partner Violence: Not on file    Objective:  Blood pressure 109/72, pulse 97, resp. rate 20, height _0  (1.626 m), weight 148 lb (67.1 kg), SpO2 98 %, unknown if currently breastfeeding. General: No acute distress.  Patient appears well-groomed.   Head:  Normocephalic/atraumatic Eyes:  fundi examined but not visualized Neck: supple, no paraspinal tenderness, full range of motion Back: No paraspinal tenderness Heart: regular rate and rhythm Lungs: Clear  to auscultation bilaterally. Vascular: No carotid bruits. Neurological Exam: Mental status: alert and oriented to person, place, and time, recent and remote memory intact, fund of knowledge intact, attention and concentration intact, speech fluent and not dysarthric, language intact. Cranial nerves: CN I: not tested CN II: pupils equal, round and reactive to light, visual fields intact CN III, IV, VI:  full range of motion, no nystagmus, no ptosis CN V: facial sensation intact. CN VII: upper and lower face symmetric CN VIII: hearing intact CN IX, X: gag intact, uvula midline CN XI: sternocleidomastoid and trapezius muscles intact CN XII: tongue midline Bulk & Tone: normal, no fasciculations. Motor:  muscle strength 5/5 throughout Sensation:  Pinprick, temperature and vibratory sensation  intact. Deep Tendon Reflexes:  2+ throughout,  toes downgoing.   Finger to nose testing:  Without dysmetria.   Heel to shin:  Without dysmetria.   Gait:  Normal station and stride.  Romberg negative.  Assessment/Plan:   Chronic migraine without aura, without status migrainosus, not intractable  1. For migraine prevention:  Emgality monthly 2.  For migraine rescue:  Continue Nurtec 3.  Limit use of pain relievers to no more than 2 days out of week to prevent risk of rebound or medication-overuse headache. 4.  Keep headache diary 5.  Follow up 6 months.    Thank you for allowing me to take part in the care of this patient.  Metta Clines, DO  CC: [name]. [name], FNP

## 2020-11-30 ENCOUNTER — Other Ambulatory Visit: Payer: Self-pay

## 2020-11-30 ENCOUNTER — Ambulatory Visit (INDEPENDENT_AMBULATORY_CARE_PROVIDER_SITE_OTHER): Payer: Medicaid Other | Admitting: Neurology

## 2020-11-30 ENCOUNTER — Encounter: Payer: Self-pay | Admitting: Neurology

## 2020-11-30 VITALS — BP 109/72 | HR 97 | Resp 20 | Ht 64.0 in | Wt 148.0 lb

## 2020-11-30 DIAGNOSIS — G43711 Chronic migraine without aura, intractable, with status migrainosus: Secondary | ICD-10-CM | POA: Diagnosis not present

## 2020-11-30 MED ORDER — NURTEC 75 MG PO TBDP: 75.0000 mg | ORAL_TABLET | Freq: Every day | ORAL | 5 refills | Status: DC | PRN

## 2020-11-30 MED ORDER — EMGALITY 120 MG/ML ~~LOC~~ SOAJ: 120.0000 mg | SUBCUTANEOUS | 5 refills | Status: DC

## 2020-11-30 NOTE — Patient Instructions (Signed)
  1. Start Emgality injection every 28 days 2. Take Nurtec 75mg  at earliest onset of headache.  Maximum 1 tablet in 24 hours. 3. Limit use of pain relievers to no more than 2 days out of the week.  These medications include acetaminophen, NSAIDs (ibuprofen/Advil/Motrin, naproxen/Aleve, triptans (Imitrex/sumatriptan), Excedrin, and narcotics.  This will help reduce risk of rebound headaches. 4. Be aware of common food triggers:  - Caffeine:  coffee, black tea, cola, Mt. Dew  - Chocolate  - Dairy:  aged cheeses (brie, blue, cheddar, gouda, [place], provolone, [place], Swiss, etc), chocolate milk, buttermilk, sour cream, limit eggs and yogurt  - Nuts, peanut butter  - Alcohol  - Cereals/grains:  FRESH breads (fresh bagels, sourdough, doughnuts), yeast productions  - Processed/canned/aged/cured meats (pre-packaged deli meats, hotdogs)  - MSG/glutamate:  soy sauce, flavor enhancer, pickled/preserved/marinated foods  - Sweeteners:  aspartame (Equal, Nutrasweet).  Sugar and Splenda are okay  - Vegetables:  legumes (lima beans, lentils, snow peas, fava beans, pinto peans, peas, garbanzo beans), sauerkraut, onions, olives, pickles  - Fruit:  avocados, bananas, citrus fruit (orange, lemon, grapefruit), mango  - Other:  Frozen meals, macaroni and cheese 5. Routine exercise 6. Stay adequately hydrated (aim for 64 oz water daily) 7. Keep headache diary 8. Maintain proper stress management 9. Maintain proper sleep hygiene 10. Do not skip meals 11. Consider supplements:  magnesium citrate 400mg  daily, riboflavin 400mg  daily, coenzyme Q10 100mg  three times daily.

## 2020-12-01 ENCOUNTER — Encounter: Payer: Self-pay | Admitting: Neurology

## 2020-12-01 NOTE — Progress Notes (Signed)
Amanda [name] (Key: [id number]) Rx #: [id number] Nurtec 75MG  dispersible tablets   Form [employer] [place] Prior Authorization Request Form [phone] NCPDP) Created 20 hours ago Sent to Plan 20 hours ago Plan Response 18 hours ago Submit Clinical Questions 17 hours ago Determination Favorable 14 hours ago Message from Plan Approved. This drug has been approved. Approved quantity: 16 <> per 30 day(s). You may fill up to a 34 day supply at a retail pharmacy. You may fill up to a 90 day supply for maintenance drugs, please refer to the formulary for details. Please call the pharmacy to process your prescription claim.  Approval valid from 11/30/20 to 03/31/21.

## 2020-12-02 ENCOUNTER — Encounter: Payer: Self-pay | Admitting: Neurology

## 2020-12-02 NOTE — Progress Notes (Signed)
Received fax denial from [facility] that patient did not meet requirements; She did not fail medication from 2 different classes. Gave paperwork to Dr. Tomi Likens to see if he would like to appeal.

## 2020-12-02 NOTE — Progress Notes (Signed)
12/16- jaffe wants to Appeal; called patient to let her know she wants appeal consent form mailed to her. Mailed out form to her same day. Awaiting form back so we can fax to appeals dept.

## 2020-12-21 IMAGING — CT CT ABD-PELV W/ CM
2 of 4 series · 16 of 46 positions shown, 18 images · IV contrast (APPLIED)
Comparison: None.

CLINICAL DATA: Unspecified abdominal pain, nausea, vomiting,
diarrhea

EXAM:
CT ABDOMEN AND PELVIS WITH CONTRAST
TECHNIQUE: Multidetector CT imaging of the abdomen and pelvis was performed
using the standard protocol following bolus administration of
intravenous contrast.
CONTRAST:  100mL OMNIPAQUE IOHEXOL 300 MG/ML  SOLN

[Series 2: axial st · axial · 0.81mm/px · z∈[-821,-396]mm · 13 of 93 slices shown, 15 images]
[im 4/93  soft-tissue]
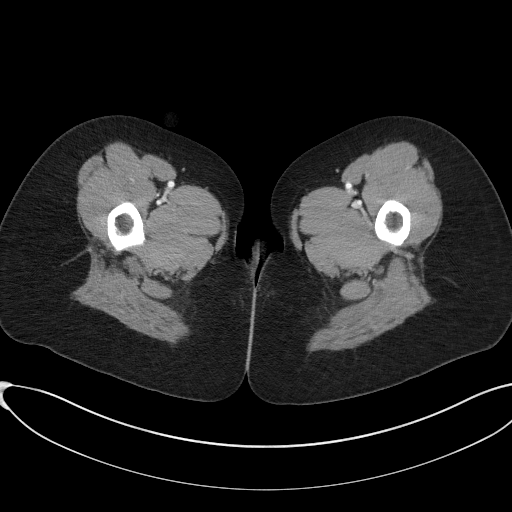
[im 4/93  bone]
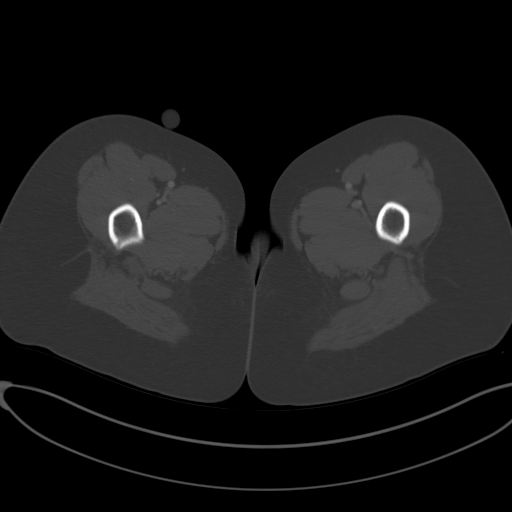
[im 12/93  soft-tissue]
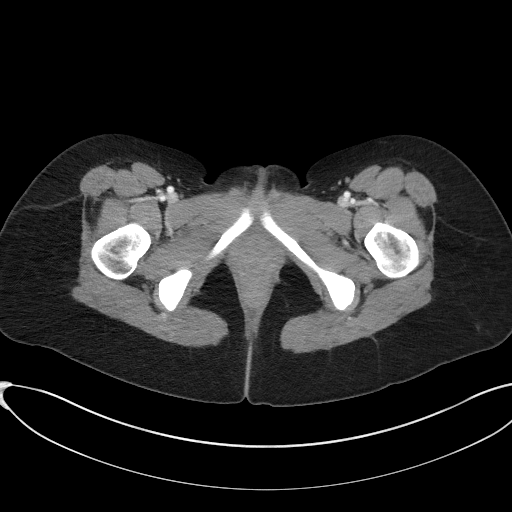
[im 19/93  soft-tissue]
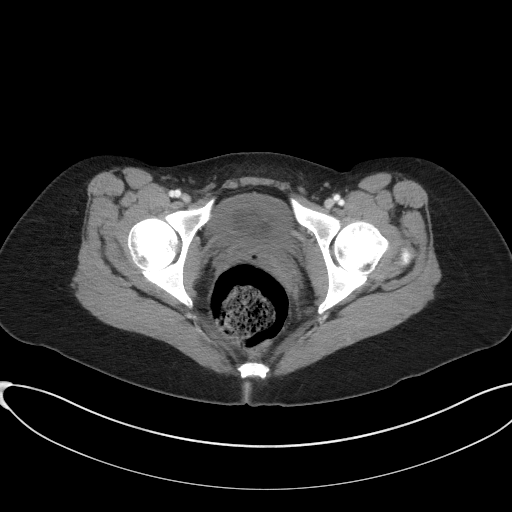
[im 26/93  soft-tissue]
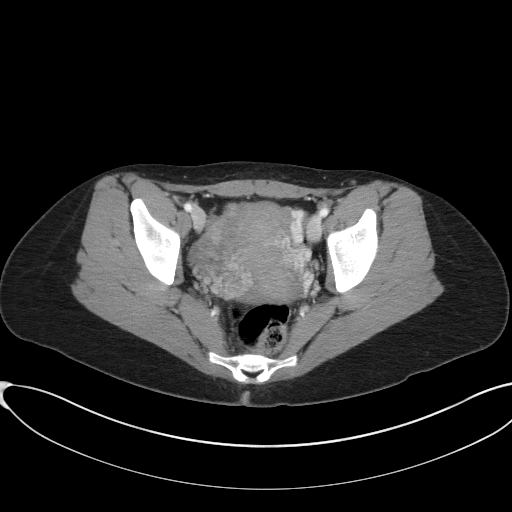
[im 34/93  soft-tissue]
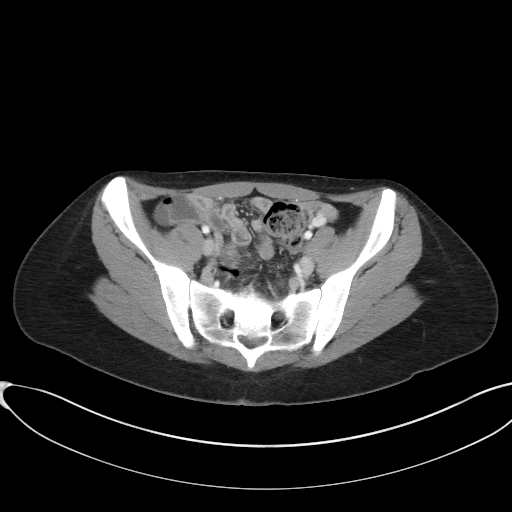
[im 41/93  soft-tissue]
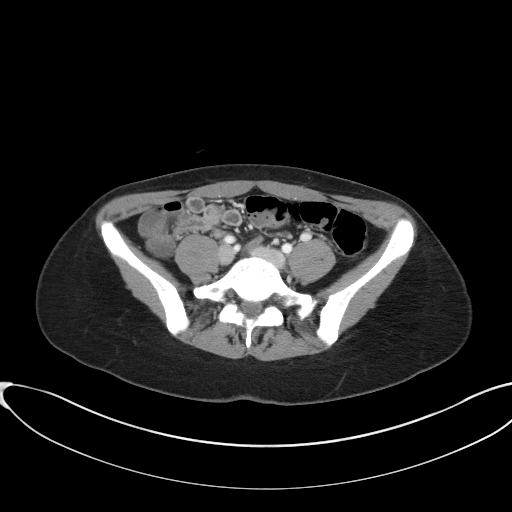
[im 48/93  soft-tissue]
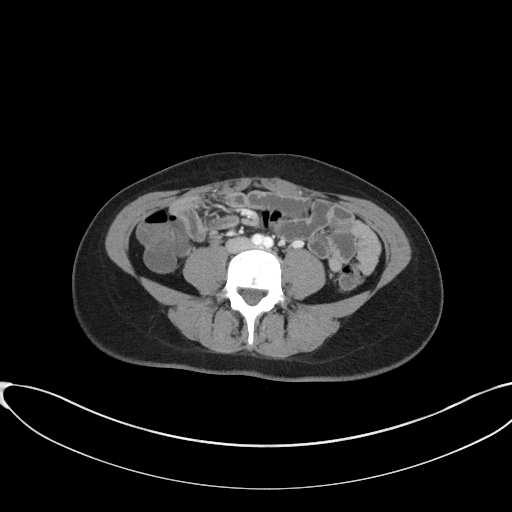
[im 52/93  soft-tissue]
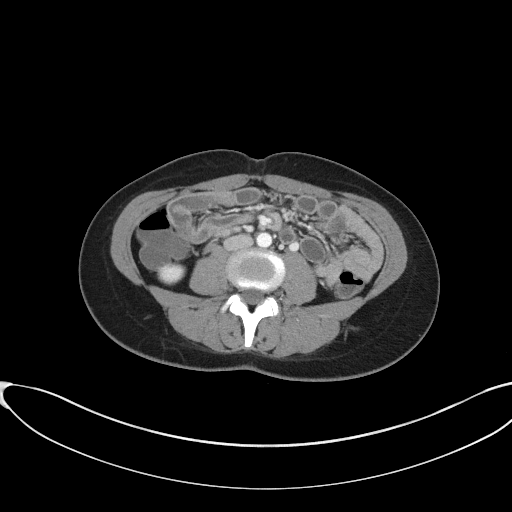
[im 59/93  soft-tissue]
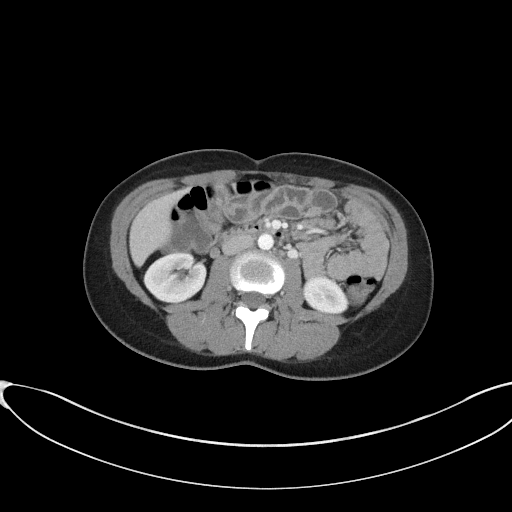
[im 59/93  bone]
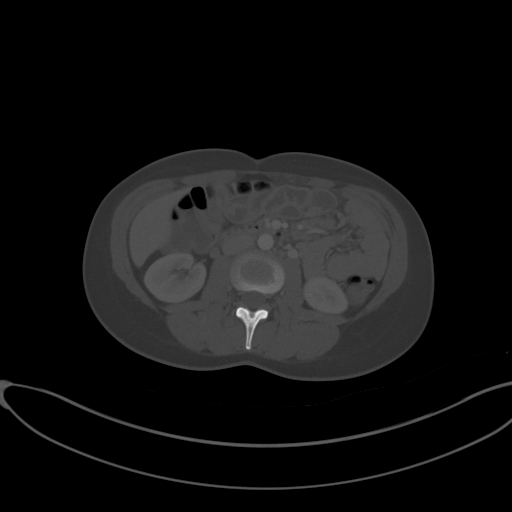
[im 67/93  soft-tissue]
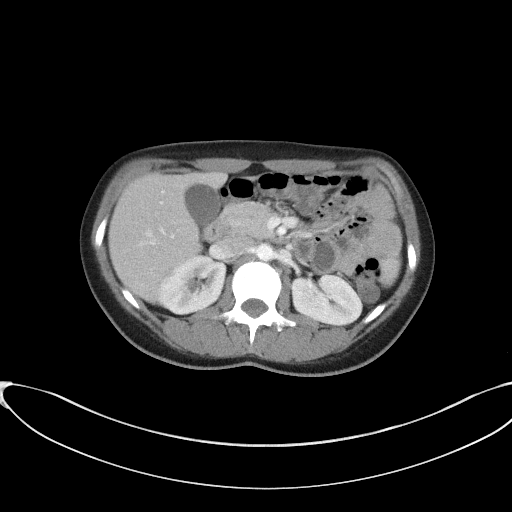
[im 74/93  soft-tissue]
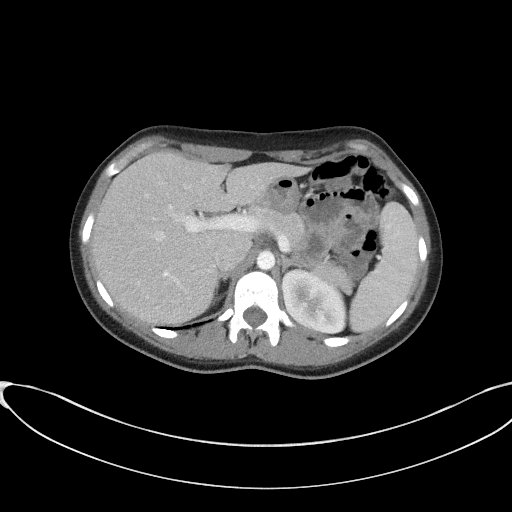
[im 81/93  soft-tissue]
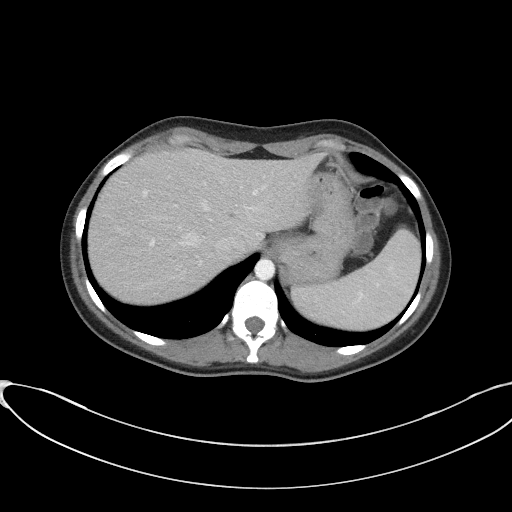
[im 89/93  soft-tissue]
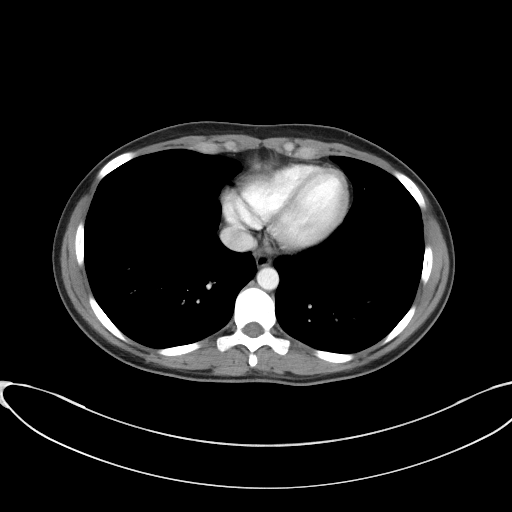

[Series 5: coronal st · coronal · 0.68mm/px · 3 of 101 slices shown]
[im 34/101  soft-tissue]
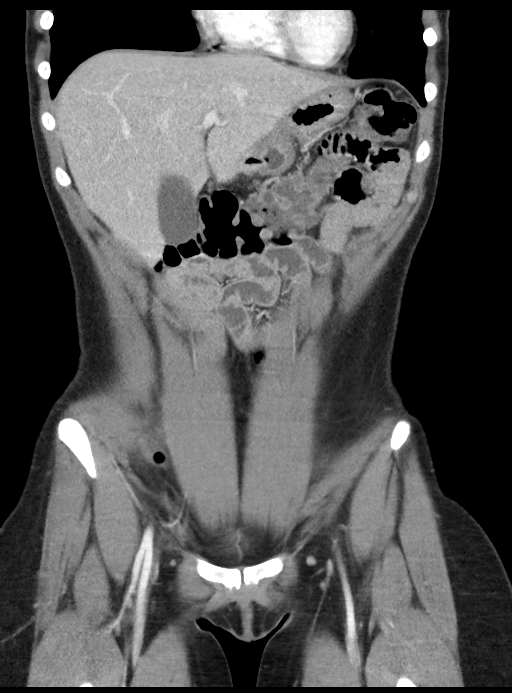
[im 45/101  soft-tissue]
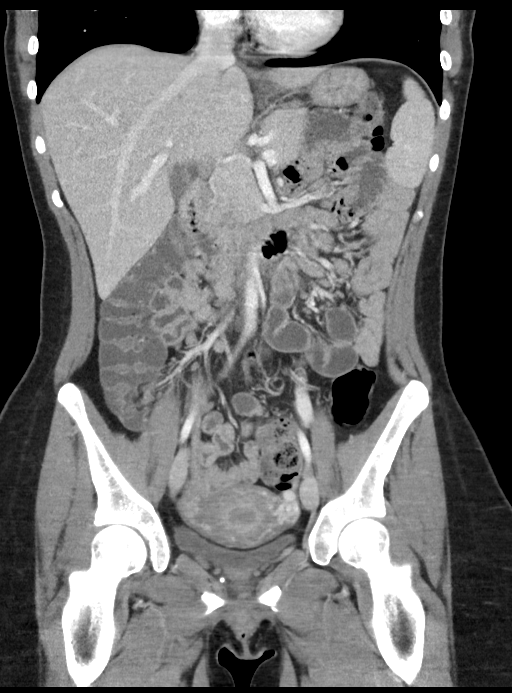
[im 56/101  soft-tissue]
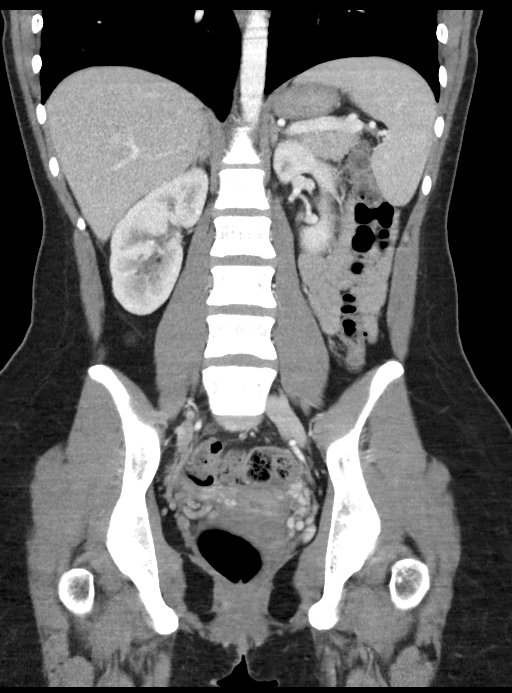

[16 of 46 positions shown; findings below may reference images not displayed]

FINDINGS: Lower chest: The visualized lung bases are clear bilaterally.
Visualized heart and pericardium are unremarkable.

Hepatobiliary: No focal liver abnormality is seen. No gallstones,
gallbladder wall thickening, or biliary dilatation.

Pancreas: Unremarkable

Spleen: Unremarkable

Adrenals/Urinary Tract: Adrenal glands are unremarkable. Kidneys are
normal, without renal calculi, focal lesion, or hydronephrosis.
Bladder is unremarkable.

Stomach/Bowel: Stomach is within normal limits. Appendix appears
normal. No evidence of bowel wall thickening, distention, or
inflammatory changes. No free intraperitoneal gas or fluid

Vascular/Lymphatic: The left ovarian vein is dilated measuring up to
7 mm in diameter with extensive adnexal varices identified
suggesting changes of ovarian vein reflux and pelvic venous
insufficiency. The abdominal vasculature is otherwise unremarkable.
No pathologic adenopathy within the abdomen and pelvis.

Reproductive: Uterus and bilateral adnexa are unremarkable.

Other: Moderate stool within the rectal vault. The rectum is
otherwise unremarkable. Tiny fat containing umbilical hernia.

Musculoskeletal: No acute bone abnormality.
IMPRESSION: No radiographic explanation for the patient's reported symptoms.

Findings suggestive of left ovarian vein reflux and pelvic venous
insufficiency.

## 2020-12-23 ENCOUNTER — Encounter: Payer: Self-pay | Admitting: Neurology

## 2020-12-23 ENCOUNTER — Other Ambulatory Visit: Payer: Self-pay | Admitting: Neurology

## 2020-12-23 MED ORDER — QULIPTA 60 MG PO TABS: 60.0000 mg | ORAL_TABLET | Freq: Every day | ORAL | 5 refills | Status: DC

## 2020-12-23 NOTE — Progress Notes (Signed)
Tempie Hoist Key: [id number] - [place] Case ID: [id number] Need help? Call us at [phone] Outcome Approvedtoday Approved. This drug has been approved. Approved quantity: 30 <> per 30 day(s). You may fill up to a 34 day supply at a retail pharmacy. You may fill up to a 90 day supply for maintenance drugs, please refer to the formulary for details. Please call the pharmacy to process your prescription claim. Drug Qulipta 60MG  tablets Form [facility] Medicaid of [facility] Prior Authorization Request Form [phone] NCPDP)  Start date: 12/23/20        End date: Until further notice

## 2020-12-23 NOTE — Progress Notes (Addendum)
1/6- appeal submitted to insurance for [place] denial   1/19- received fax from [facility] that patient gave permission to withdraw the appeal for the [facility] medication. Sent these forms to scanning for her chart so we would have record.

## 2021-01-04 ENCOUNTER — Other Ambulatory Visit: Payer: Self-pay

## 2021-01-04 ENCOUNTER — Encounter: Payer: Self-pay | Admitting: Obstetrics & Gynecology

## 2021-01-04 ENCOUNTER — Ambulatory Visit (INDEPENDENT_AMBULATORY_CARE_PROVIDER_SITE_OTHER): Payer: Medicaid Other | Admitting: Obstetrics & Gynecology

## 2021-01-04 VITALS — BP 120/70 | Ht 64.0 in | Wt 150.0 lb

## 2021-01-04 DIAGNOSIS — R102 Pelvic and perineal pain: Secondary | ICD-10-CM | POA: Diagnosis not present

## 2021-01-04 NOTE — Progress Notes (Signed)
Vaginal Pain Patient is a 25 yo G1P0102 WF who presents for evaluation of vaginal pain. The pain is described as like a cut or tear in the opening of thevagina, worsened with placement of tampon during her last period, and onset was sudden occurring 1 week ago. Symptoms have been gradually improving since. No associated sx's of bleeding or radiation of pain.  Worsened w tampon touch, and does hut when she voids. The patient denies chills, constipation, diarrhea, fever, nausea and vomiting. Risk factors for pelvic/abdominal pain include none.   PMHx: She  has a past medical history of BRCA negative (09/2020), Family history of adverse reaction to anesthesia, Family history of breast cancer (09/2020), Family history of pancreatic cancer, GERD (gastroesophageal reflux disease), and Migraine. Also,  has a past surgical history that includes No past surgeries and Wisdom tooth extraction., family history includes Allergies in her brother; Asthma in her maternal grandmother; Breast cancer (age of onset: 34) in her maternal aunt; Colon cancer (age of onset: 57) in her maternal grandfather; Colon cancer (age of onset: 53) in her paternal grandfather; Diabetes in her paternal grandfather and paternal grandmother; Hypertension in her maternal grandfather, maternal grandmother, paternal grandfather, and paternal grandmother; Migraines in her mother; Pancreatic cancer (age of onset: 33) in her maternal grandfather; Skin cancer in her father; Thyroid disease in her maternal aunt and maternal grandmother.,  reports that she has never smoked. She has never used smokeless tobacco. She reports that she does not drink alcohol and does not use drugs.  She has a current medication list which includes the following prescription(s): qulipta, azithromycin, cetirizine, dicyclomine, ibuprofen, norethindrone, omeprazole, ondansetron, ondansetron, nurtec, and sertraline. Also, is allergic to imitrex [sumatriptan] and  penicillins.  Review of Systems  All other systems reviewed and are negative.   Objective: BP 120/70   Ht 5' 4"  (1.626 m)   Wt 150 lb (68 kg)   LMP 12/29/2020   BMI 25.75 kg/m  Physical Exam Constitutional:      General: She is not in acute distress.    Appearance: She is well-developed.  Genitourinary:     Vagina and uterus normal.     No lesions in the vagina.     Right Labia: No rash, tenderness, lesions, skin changes or Bartholin's cyst.    Left Labia: No tenderness, lesions, skin changes, Bartholin's cyst or rash.    No vaginal discharge, erythema, tenderness or bleeding.      Right Adnexa: not tender and no mass present.    Left Adnexa: not tender and no mass present.    No cervical motion tenderness, discharge, polyp or nabothian cyst.     Uterus is mobile.     Uterus is not enlarged.     No uterine mass detected.    Uterus is midaxial.     Pelvic exam was performed with patient supine.  HENT:     Head: Normocephalic and atraumatic.     Nose: Nose normal.  Abdominal:     General: There is no distension.     Palpations: Abdomen is soft.     Tenderness: There is no abdominal tenderness.  Musculoskeletal:        General: Normal range of motion.  Neurological:     Mental Status: She is alert and oriented to person, place, and time.     Cranial Nerves: No cranial nerve deficit.  Skin:    General: Skin is warm and dry.  Psychiatric:        Attention  and Perception: Attention normal.        Mood and Affect: Mood and affect normal.        Speech: Speech normal.        Behavior: Behavior normal.        Thought Content: Thought content normal.        Judgment: Judgment normal.     ASSESSMENT/PLAN:    Problem List Items Addressed This Visit     Vaginal pain       No s/sx laceration, rash, trauma, infection    Monitor for sx resolution now that she is off of period and not having to use tampon for a few weeks      Barnett Applebaum, MD, [facility],  [facility] 01/04/2021  11:27 AM

## 2021-01-09 ENCOUNTER — Other Ambulatory Visit: Payer: Self-pay | Admitting: Adult Health

## 2021-01-09 NOTE — Telephone Encounter (Signed)
Requested Prescriptions  Pending Prescriptions Disp Refills  . cetirizine (ZYRTEC) 10 MG tablet [Pharmacy Med Name: CETIRIZINE HCL 10 MG TABLET] 30 tablet 1    Sig: TAKE 1 TABLET BY MOUTH EVERY DAY     Ear, Nose, and Throat:  Antihistamines Passed - 01/09/2021  2:07 PM      Passed - Valid encounter within last 12 months    Recent Outpatient Visits          3 months ago Hand injury, right, initial encounter   [name], [name], FNP   6 months ago Chronic migraine without aura with status migrainosus, not intractable   [facility] [name], [name], FNP   2 years ago Irregular periods/menstrual cycles   [facility], Royetta Car, PA-C   3 years ago Skin nodule   [facility], Royetta Car, PA-C   3 years ago Migraine without aura and with status migrainosus, not intractable   [facility], Royetta Car, PA-C

## 2021-01-12 ENCOUNTER — Ambulatory Visit
Admission: RE | Admit: 2021-01-12 | Discharge: 2021-01-12 | Disposition: A | Discharge status: Home / Self Care | Payer: Medicaid Other | Source: Ambulatory Visit | Attending: Family Medicine | Admitting: Family Medicine

## 2021-01-12 ENCOUNTER — Other Ambulatory Visit: Payer: Self-pay

## 2021-01-12 VITALS — BP 101/66 | HR 92 | Temp 99.3°F | Resp 18 | Ht 64.0 in | Wt 150.0 lb

## 2021-01-12 DIAGNOSIS — B349 Viral infection, unspecified: Secondary | ICD-10-CM

## 2021-01-12 DIAGNOSIS — Z20822 Contact with and (suspected) exposure to covid-19: Secondary | ICD-10-CM

## 2021-01-12 NOTE — Discharge Instructions (Addendum)
We have tested you for COVID  You can take OTC medications as needed.  Recommend Mucinex Follow up as needed for continued or worsening symptoms
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
## 2021-01-12 NOTE — ED Triage Notes (Signed)
Pt c/o productive cough with green sputum, runny nose, congestion, low grade fever, n/d for approx 2 days.  Denies fever, vomiting, ear pain,SOB No OTC medications for symptoms

## 2021-01-15 LAB — SARS-COV-2, NAA 2 DAY TAT

## 2021-01-15 LAB — NOVEL CORONAVIRUS, NAA: SARS-CoV-2, NAA: DETECTED — AB

## 2021-01-27 IMAGING — US US MFM OB FOLLOW-UP
1 series · 12 of 28 positions shown · non-contrast
Comparison: none

PATIENT INFO:

PERFORMED BY:
                   Sonographer
SERVICE(S) PROVIDED:
 ----------------------------------------------------------------------
INDICATIONS:
  34 weeks gestation of pregnancy
FETAL EVALUATION (FETUS A):
 Num Of Fetuses:         2
 Fetal Heart Rate(bpm):  168
 Cardiac Activity:       Present
 Fetal Lie:              Maternal Right
 Presentation:           Vertex
 Placenta:               Anterior
 P. Cord Insertion:      Normal
                             Largest Pocket(cm)
BIOMETRY (FETUS A):
 BPD:      82.8  mm     G. Age:  33w 2d         19  %    CI:        72.99   %    70 - 86
                                                         FL/HC:      20.8   %    19.4 -
 HC:      308.1  mm     G. Age:  34w 3d         17  %    HC/AC:      1.04        0.96 -
 AC:      295.4  mm     G. Age:  33w 4d         29  %    FL/BPD:     77.4   %    71 - 87
 FL:       64.1  mm     G. Age:  33w 1d         13  %    FL/AC:      21.7   %    20 - 24
 HUM:      56.5  mm     G. Age:  32w 6d         28  %
 CER:      39.8  mm     G. Age:  33w 6d         43  %
 CM:        6.8  mm
 Est. FW:    0039  gm    4 lb 14 oz      25  %     FW Discordancy      0 \ 7 %
GESTATIONAL AGE (FETUS A):
 LMP:           34w 3d        Date:  04/04/19                 EDD:   01/09/20
 U/S Today:     33w 4d                                        EDD:   01/15/20
 Best:          34w 3d     Det. By:  LMP  (04/04/19)          EDD:   01/09/20
DOPPLER - FETAL VESSELS (FETUS A):
 Umbilical Artery
  S/D     %tile
  3.2       83
FETAL EVALUATION (FETUS B):
 Fetal Heart Rate(bpm):  158
 Fetal Lie:              Maternal Left
BIOMETRY (FETUS B):
 BPD:      81.6  mm     G. Age:  32w 6d         10  %    CI:        71.18   %    70 - 86
                                                         FL/HC:      20.3   %    19.4 -
 HC:      308.1  mm     G. Age:  34w 3d         17  %    HC/AC:      1.08        0.96 -
 AC:      286.2  mm     G. Age:  32w 4d         11  %    FL/BPD:     76.5   %    71 - 87
 FL:       62.4  mm     G. Age:  32w 2d          5  %    FL/AC:      21.8   %    20 - 24
 HUM:      56.9  mm     G. Age:  33w 0d         33  %
 CM:        8.1  mm
 Est. FW:    0373  gm      4 lb 8 oz     10  %     FW Discordancy         7  %
GESTATIONAL AGE (FETUS B):
 U/S Today:     33w 0d                                        EDD:   01/19/20
DOPPLER - FETAL VESSELS (FETUS B):
 2.95       71

[Series 1: us mfm ob follow-up · 0.23mm/px · 112 acquisitions, 12 frames shown]
[im 5/112]
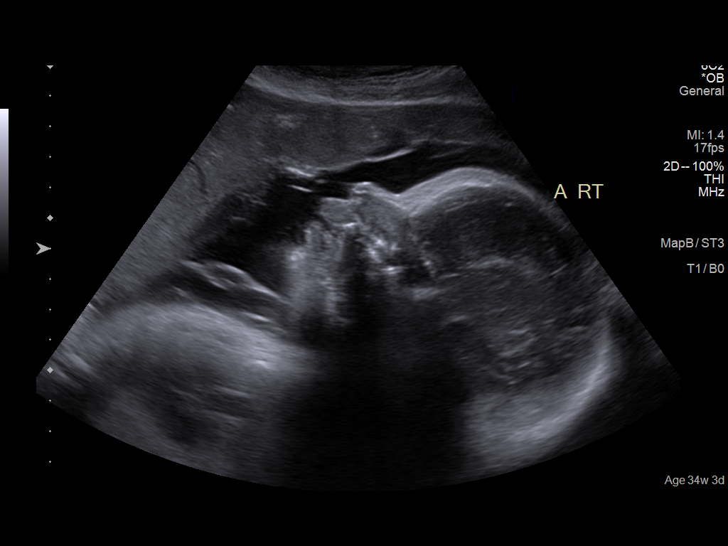
[im 13/112]
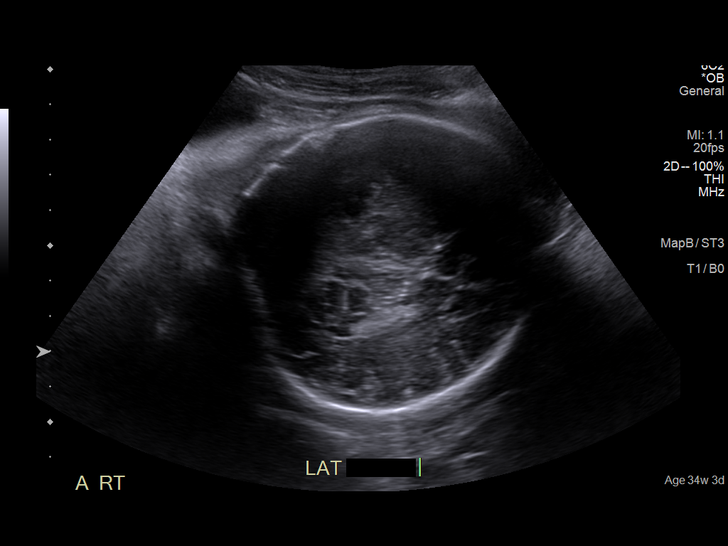
[im 21/112]
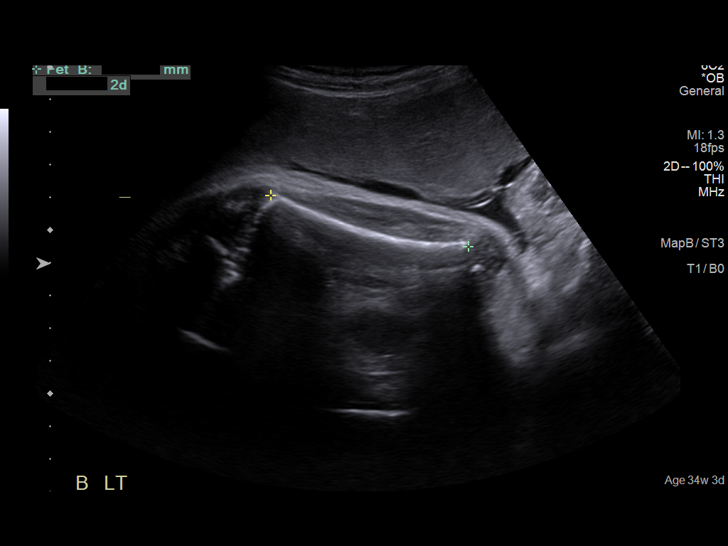
[im 33/112]
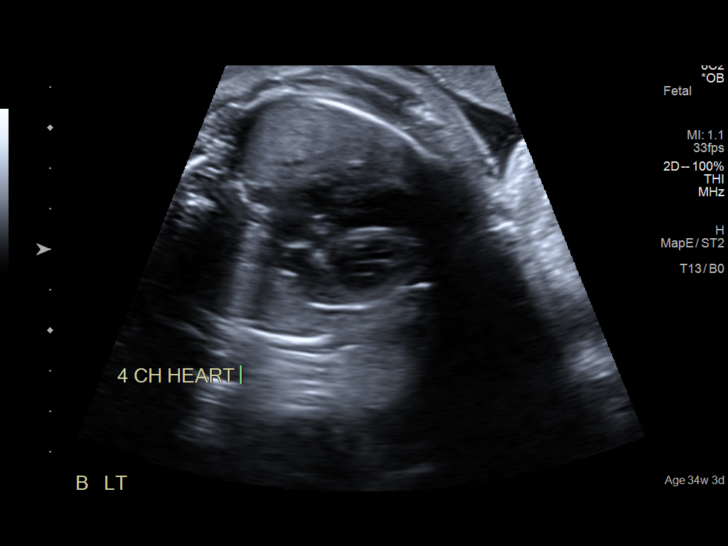
[im 42/112]
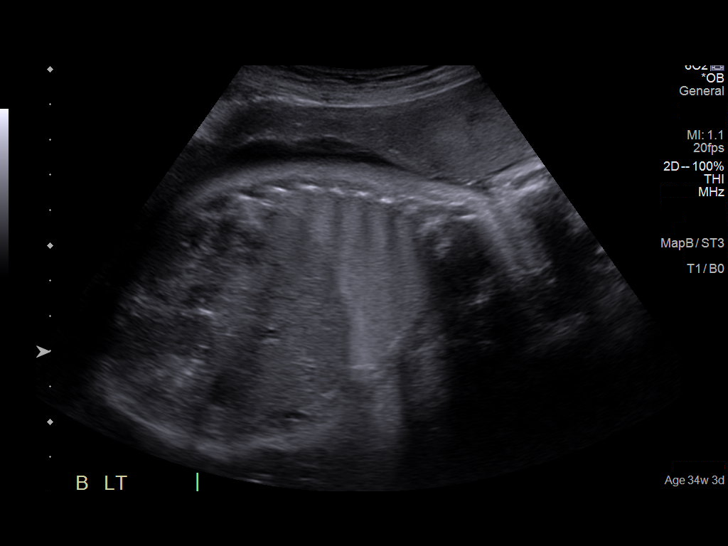
[im 50/112]
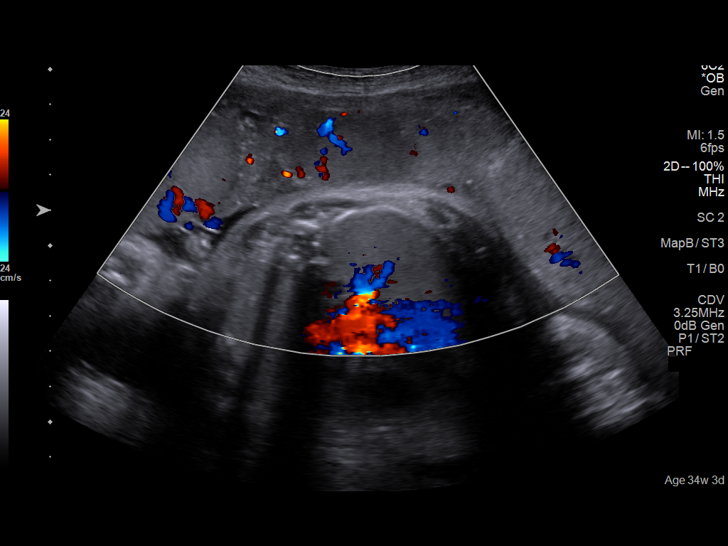
[im 62/112]
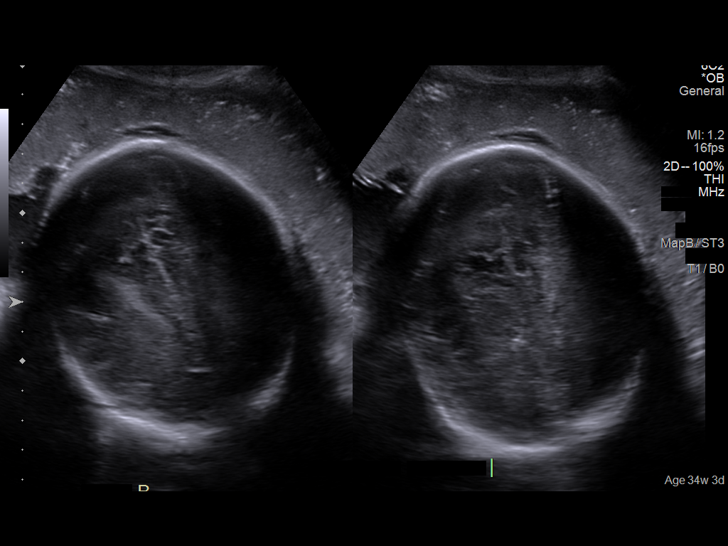
[im 70/112]
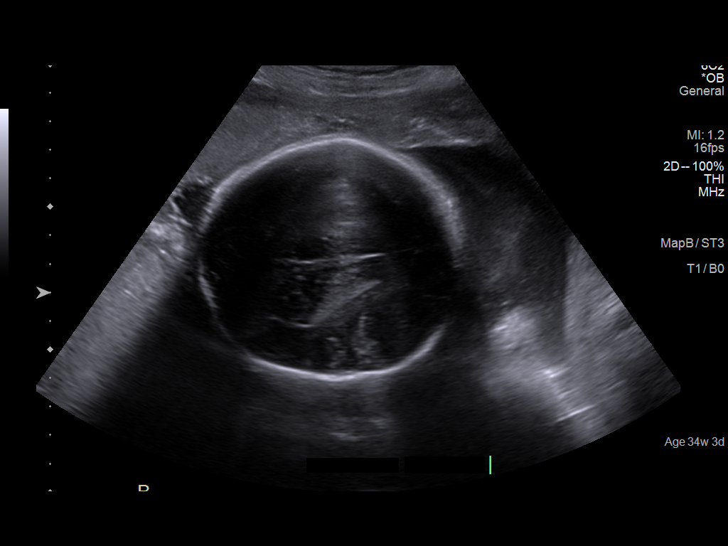
[im 79/112]
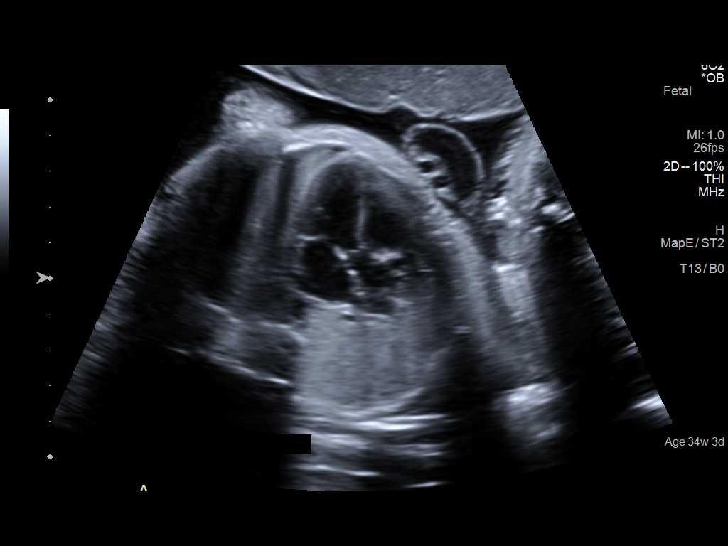
[im 91/112]
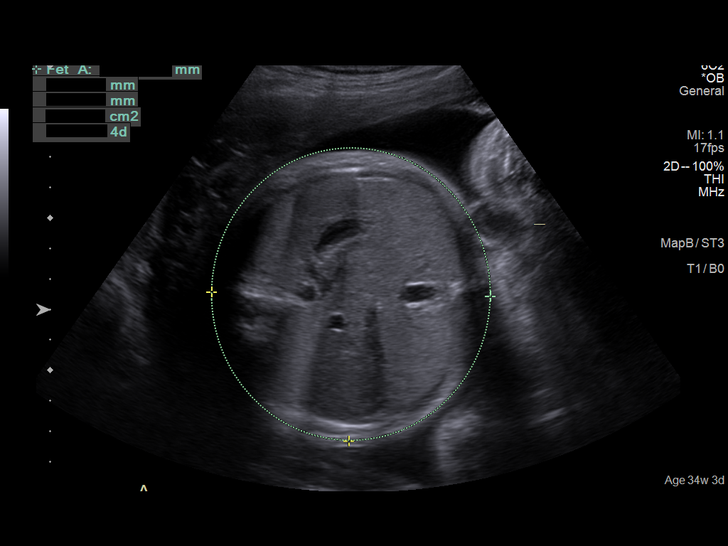
[im 99/112]
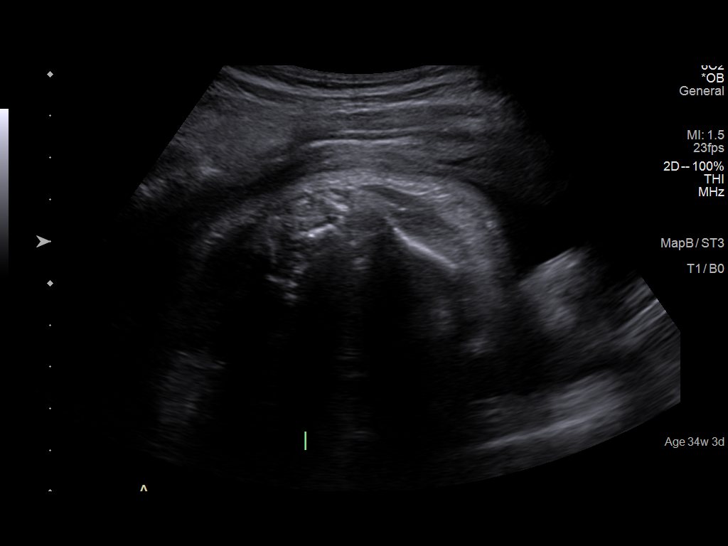
[im 107/112]
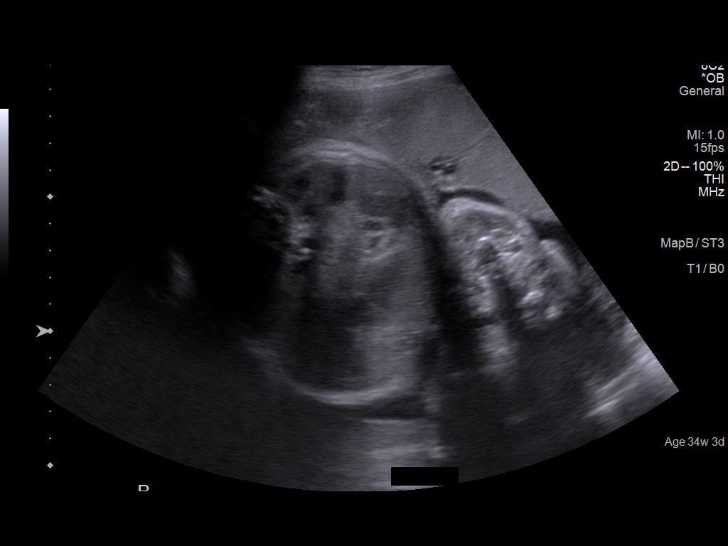

[12 of 28 positions shown; findings below may reference images not displayed]

IMPRESSION: Dear Dr. Ferdinand,

 Thank you for referring your patient for Danii/Vedro Kerum growth
 evaluation and antenatal testing due to mild FGR of twin B.

 A twin pregnancy at 34 weeks 3 days is identified.

 A dividing membrane is once again identified.

 Twin A is located on the maternal right   side anterior
 placenta.
 Twin B is located on the maternal  left   side anterior placenta.

 Twin A is cephalic.  MVP is 7cm.  The estimated fetal weight
 is 2203g (25%).  The BPP is [DATE].  The umbilcal artery S/D
 ratio is 3.2 (normal).

 Twin B is cephalic. MVP is 4.1cm.  The estimated fetal weight
 is 2038g (10th percentile). The umbilcal artery S/D ratio is
 2.95 (normal).

 Fetal growth restrition in Twin B, mild now with overall EFW
 at the 10th percentile.  As we discussed, given the overall
 clinical picture of previous mild FGR and doppler
 elevations(currently both are in normal range) in these Danii/Quranla
 twins.  I would still favor late preterm delivery.  Gien normal
 dopplers and overall weights, delivery does not need to occur
 in 34th week but can occur later.  The plan for deliery in 35th
 week is appropriate per ACOG guidelines (ACOG Practice
 Bulletin 764). .

 Testing is reassuring today.  Agree with plans for delivery in
 the 35th week (she is scheduled for delivery on [DATE]).

 Thank you for allowing us to participate in your patient's care.
                       Iniestra, Claudia Marlene

## 2021-02-09 ENCOUNTER — Ambulatory Visit (INDEPENDENT_AMBULATORY_CARE_PROVIDER_SITE_OTHER): Payer: Medicaid Other | Admitting: Obstetrics and Gynecology

## 2021-02-09 ENCOUNTER — Other Ambulatory Visit: Payer: Self-pay

## 2021-02-09 ENCOUNTER — Encounter: Payer: Self-pay | Admitting: Obstetrics and Gynecology

## 2021-02-09 VITALS — BP 100/70 | Ht 64.0 in | Wt 142.0 lb

## 2021-02-09 DIAGNOSIS — R102 Pelvic and perineal pain: Secondary | ICD-10-CM | POA: Diagnosis not present

## 2021-02-09 DIAGNOSIS — N898 Other specified noninflammatory disorders of vagina: Secondary | ICD-10-CM | POA: Diagnosis not present

## 2021-02-09 DIAGNOSIS — A6000 Herpesviral infection of urogenital system, unspecified: Secondary | ICD-10-CM | POA: Diagnosis not present

## 2021-02-09 DIAGNOSIS — R350 Frequency of micturition: Secondary | ICD-10-CM

## 2021-02-09 LAB — POCT URINALYSIS DIPSTICK
Bilirubin, UA: NEGATIVE
Blood, UA: NEGATIVE
Glucose, UA: NEGATIVE
Ketones, UA: NEGATIVE
Leukocytes, UA: NEGATIVE
Nitrite, UA: NEGATIVE
Protein, UA: NEGATIVE
Spec Grav, UA: 1.025 (ref 1.010–1.025)
pH, UA: 7.5 (ref 5.0–8.0)

## 2021-02-09 NOTE — Progress Notes (Signed)
[name], Amanda Aline, FNP   Chief Complaint  Patient presents with  . Vaginal Irritation    Swelling, no discharge or odor since yesterday  . Urinary Tract Infection    Frequency and pelvic pain x 1 week    HPI:      [name] is a 25 y.o. [id number] whose LMP was Patient's last menstrual period was 01/18/2021 (approximate)., presents today for RT labial pain and swelling since yesterday. Hurts to sit, touch and with urination and wiping. No lesions, no other vag d/c, odor. Had similar pain in same area with 1/22 menses and had neg exam with Dr. Kenton [name]. Sx lasted about 4-5 days with menses then resolved. Pt wondered if there was a cut, but nothing seen. Sx same this time but has swelling and feels worse. Treating with neosporin. No hx of cold sores; no HSV hx for her or partner. Has had recent soap change, no dryer sheets, no wipes.  Has had urinary frequency with decreased flow off and on past wk, with mild pelvic discomfort. Drinks 1 caffeine daily. No LBP, fevers.  No new sex partners since 7/21 neg STD testing.   Past Medical History:  Diagnosis Date  . BRCA negative 09/2020   MyRisk neg except AXIN2 VUS  . Family history of adverse reaction to anesthesia    heart condition  . Family history of breast cancer 09/2020   IBIS=15.7%/riskscore=14.7%  . Family history of pancreatic cancer   . GERD (gastroesophageal reflux disease)   . Migraine     Past Surgical History:  Procedure Laterality Date  . NO PAST SURGERIES    . WISDOM TOOTH EXTRACTION      Family History  Problem Relation Age of Onset  . Migraines Mother   . Skin cancer Father   . Asthma Maternal Grandmother   . Hypertension Maternal Grandmother   . Thyroid disease Maternal Grandmother   . Hypertension Maternal Grandfather   . Pancreatic cancer Maternal Grandfather 43  . Colon cancer Maternal Grandfather 73  . Hypertension Paternal Grandmother   . Diabetes Paternal Grandmother   . Hypertension  Paternal Grandfather   . Diabetes Paternal Grandfather   . Colon cancer Paternal Grandfather 55  . Thyroid disease Maternal Aunt   . Breast cancer Maternal Aunt 35  . Allergies Brother     Social History   Socioeconomic History  . Marital status: Single    Spouse name: Amanda [name]  . Number of children: Not on file  . Years of education: Not on file  . Highest education level: Not on file  Occupational History  . Occupation: Massage Therapy  Tobacco Use  . Smoking status: Never Smoker  . Smokeless tobacco: Never Used  Vaping Use  . Vaping Use: Never used  Substance and Sexual Activity  . Alcohol use: Never  . Drug use: No  . Sexual activity: Yes    Birth control/protection: None  Other Topics Concern  . Not on file  Social History Narrative   ** Merged History Encounter **       One story home Right-handed Caffeine: occasional coffee   Social Determinants of Radio broadcast assistant Strain: Not on file  Food Insecurity: Not on file  Transportation Needs: Not on file  Physical Activity: Not on file  Stress: Not on file  Social Connections: Not on file  Intimate Partner Violence: Not on file    Outpatient Medications Prior to Visit  Medication Sig Dispense Refill  .  Atogepant (QULIPTA) 60 MG TABS Take 60 mg by mouth daily. 30 tablet 5  . Rimegepant Sulfate (NURTEC) 75 MG TBDP Take 75 mg by mouth daily as needed (maximum 1 tablet in 24 hours.). 16 tablet 5  . cetirizine (ZYRTEC) 10 MG tablet TAKE 1 TABLET BY MOUTH EVERY DAY (Patient not taking: Reported on 02/09/2021) 30 tablet 1   No facility-administered medications prior to visit.      ROS:  Review of Systems  Constitutional: Negative for fever, malaise/fatigue and weight loss.  Gastrointestinal: Negative for blood in stool, constipation, diarrhea, nausea and vomiting.  Genitourinary: Positive for dysuria, frequency and vaginal pain. Negative for dyspareunia, flank pain, hematuria, urgency, vaginal  bleeding and vaginal discharge.  Musculoskeletal: Negative for back pain.  Skin: Negative for itching and rash.    OBJECTIVE:   Vitals:  BP 100/70   Ht 5' 4"  (1.626 m)   Wt 142 lb (64.4 kg)   LMP 01/18/2021 (Approximate)   Breastfeeding No   BMI 24.37 kg/m   Physical Exam Vitals reviewed.  Constitutional:      Appearance: She is well-developed.  Pulmonary:     Effort: Pulmonary effort is normal.  Genitourinary:    Labia:        Right: Rash and tenderness present.        Left: No rash, tenderness or lesion.     Musculoskeletal:        General: Normal range of motion.     Cervical back: Normal range of motion.  Skin:    General: Skin is warm and dry.  Neurological:     General: No focal deficit present.     Mental Status: She is alert and oriented to person, place, and time.     Cranial Nerves: No cranial nerve deficit.  Psychiatric:        Mood and Affect: Mood normal.        Behavior: Behavior normal.        Thought Content: Thought content normal.        Judgment: Judgment normal.     Results: Results for orders placed or performed in visit on 02/09/21 (from the past 24 hour(s))  POCT Urinalysis Dipstick     Status: Normal   Collection Time: 02/09/21  3:09 PM  Result Value Ref Range   Color, UA yellow    Clarity, UA clear    Glucose, UA Negative Negative   Bilirubin, UA neg    Ketones, UA neg    Spec Grav, UA 1.025 1.010 - 1.025   Blood, UA neg    pH, UA 7.5 5.0 - 8.0   Protein, UA Negative Negative   Urobilinogen, UA     Nitrite, UA neg    Leukocytes, UA Negative Negative   Appearance     Odor       Assessment/Plan: Vaginal lesion - Plan: HSV 2 antibody, IgG; very tender to touch with swelling; same area as 1/22. Rule out HSV 2. If pos, will treat with valtrex to prevent sx. If neg, question etiology. Warm compresses/sitz baths/NSAIDs/neosporin in meantime; change to dove sens skin soap. Will f/u with results.   Vaginal pain - Plan: HSV 2  antibody, IgG  Urinary frequency - Plan: POCT Urinalysis Dipstick; neg UA. D/C caffeine, f/u prn.     Return if symptoms worsen or fail to improve.  Amanda Bagg B. Lakia Gritton, PA-C 02/09/2021 3:12 PM

## 2021-02-09 NOTE — Patient Instructions (Signed)
I value your feedback and you entrusting us with your care. If you get a [facility] patient survey, I would appreciate you taking the time to let us know about your experience today. Thank you! ? ? ?

## 2021-02-10 DIAGNOSIS — A6000 Herpesviral infection of urogenital system, unspecified: Secondary | ICD-10-CM | POA: Insufficient documentation

## 2021-02-10 LAB — HSV 2 ANTIBODY, IGG: HSV 2 IgG, Type Spec: 14.5 index — ABNORMAL HIGH (ref 0.00–0.90)

## 2021-02-10 MED ORDER — VALACYCLOVIR HCL 500 MG PO TABS: 500.0000 mg | ORAL_TABLET | Freq: Every day | ORAL | 3 refills | Status: DC

## 2021-02-10 NOTE — Addendum Note (Signed)
Addended by: Ardeth Perfect B on: 0/71/2524 10:13 AM   Modules accepted: Orders

## 2021-02-14 ENCOUNTER — Other Ambulatory Visit (HOSPITAL_COMMUNITY)
Admission: RE | Admit: 2021-02-14 | Discharge: 2021-02-14 | Disposition: A | Discharge status: Home / Self Care | Payer: Medicaid Other | Source: Ambulatory Visit | Attending: Obstetrics & Gynecology | Admitting: Obstetrics & Gynecology

## 2021-02-14 ENCOUNTER — Encounter: Payer: Self-pay | Admitting: Obstetrics & Gynecology

## 2021-02-14 ENCOUNTER — Ambulatory Visit (INDEPENDENT_AMBULATORY_CARE_PROVIDER_SITE_OTHER): Payer: Medicaid Other | Admitting: Obstetrics & Gynecology

## 2021-02-14 ENCOUNTER — Other Ambulatory Visit: Payer: Self-pay

## 2021-02-14 VITALS — BP 120/80 | Ht 64.0 in | Wt 145.0 lb

## 2021-02-14 DIAGNOSIS — N870 Mild cervical dysplasia: Secondary | ICD-10-CM | POA: Insufficient documentation

## 2021-02-14 DIAGNOSIS — R768 Other specified abnormal immunological findings in serum: Secondary | ICD-10-CM | POA: Diagnosis not present

## 2021-02-14 NOTE — Progress Notes (Signed)
HPI:  Patient is a 25 y.o. [id number] presenting for follow up evaluation of abnormal PAP smear in the past.  Her last PAP was 7 months ago and was abnormal: LGSIL. She has had a prior colposcopy. Prior biopsies (if done) were CIN I.  PMHx: She  has a past medical history of BRCA negative (09/2020), Family history of adverse reaction to anesthesia, Family history of breast cancer (09/2020), Family history of pancreatic cancer, GERD (gastroesophageal reflux disease), and Migraine. Also,  has a past surgical history that includes No past surgeries and Wisdom tooth extraction., family history includes Allergies in her brother; Asthma in her maternal grandmother; Breast cancer (age of onset: 73) in her maternal aunt; Colon cancer (age of onset: 53) in her maternal grandfather; Colon cancer (age of onset: 58) in her paternal grandfather; Diabetes in her paternal grandfather and paternal grandmother; Hypertension in her maternal grandfather, maternal grandmother, paternal grandfather, and paternal grandmother; Migraines in her mother; Pancreatic cancer (age of onset: 51) in her maternal grandfather; Skin cancer in her father; Thyroid disease in her maternal aunt and maternal grandmother.,  reports that she has never smoked. She has never used smokeless tobacco. She reports that she does not drink alcohol and does not use drugs.  She has a current medication list which includes the following prescription(s): qulipta, valacyclovir, cetirizine, nurtec, [DISCONTINUED] dicyclomine, [DISCONTINUED] norethindrone, [DISCONTINUED] omeprazole, and [DISCONTINUED] sertraline. Also, is allergic to imitrex [sumatriptan] and penicillins.  Review of Systems  All other systems reviewed and are negative.   Objective: BP 120/80   Ht 5' 4"  (1.626 m)   Wt 145 lb (65.8 kg)   LMP 01/18/2021 (Approximate)   BMI 24.89 kg/m  Filed Weights   02/14/21 0831  Weight: 145 lb (65.8 kg)   Body mass index is 24.89 kg/m.  Physical  examination Physical Exam Constitutional:      General: She is not in acute distress.    Appearance: She is well-developed.  Genitourinary:     Vagina and uterus normal.     No vaginal erythema or bleeding.      Right Adnexa: not tender and no mass present.    Left Adnexa: not tender and no mass present.    No cervical motion tenderness, discharge, polyp or nabothian cyst.     Uterus is mobile.     Uterus is not enlarged.     No uterine mass detected.    Uterus is midaxial.     Pelvic exam was performed with patient supine.  HENT:     Head: Normocephalic and atraumatic.     Nose: Nose normal.  Abdominal:     General: There is no distension.     Palpations: Abdomen is soft.     Tenderness: There is no abdominal tenderness.  Musculoskeletal:        General: Normal range of motion.  Neurological:     Mental Status: She is alert and oriented to person, place, and time.     Cranial Nerves: No cranial nerve deficit.  Skin:    General: Skin is warm and dry.  Psychiatric:        Attention and Perception: Attention normal.        Mood and Affect: Mood and affect normal.        Speech: Speech normal.        Behavior: Behavior normal.        Thought Content: Thought content normal.        Judgment: Judgment normal.  ASSESSMENT:  History of Cervical Dysplasia CIN I by biopsy 07/2020  Plan:  1.  I discussed the grading system of pap smears and HPV high risk viral types.   2. Follow up PAP 6 months, vs intervention if high grade dysplasia identified. 3. Treatment of persistantly abnormal PAP smears and cervical dysplasia, even mild, is discussed w pt today in detail, as well as the pros and cons of Cryo and LEEP procedures. Will consider and discuss after results. 4. Also discussed recent HSV dx and Valtrex suppression strategies  A total of 20 minutes were spent face-to-face with the patient as well as preparation, review, communication, and documentation during this  encounter.    Barnett Applebaum, MD, [facility], [facility] 02/14/2021  8:52 AM

## 2021-02-16 LAB — CYTOLOGY - PAP: Diagnosis: NEGATIVE

## 2021-02-28 ENCOUNTER — Encounter: Payer: Self-pay | Admitting: Adult Health

## 2021-03-02 ENCOUNTER — Other Ambulatory Visit: Payer: Self-pay | Admitting: Adult Health

## 2021-03-02 DIAGNOSIS — D224 Melanocytic nevi of scalp and neck: Secondary | ICD-10-CM

## 2021-03-02 NOTE — Telephone Encounter (Signed)
Opened in error

## 2021-03-02 NOTE — Progress Notes (Signed)
Orders Placed This Encounter  Procedures  . Ambulatory referral to Dermatology    Referral Priority:   Urgent    Referral Type:   Consultation    Referral Reason:   Specialty Services Required    Referred to Provider:   Jannet Mantis, MD    Requested Specialty:   Dermatology    Number of Visits Requested:   1

## 2021-03-02 NOTE — Addendum Note (Signed)
Addended by: [name] on: 03/02/2021 04:52 PM   Modules accepted: Orders

## 2021-03-21 ENCOUNTER — Encounter: Payer: Self-pay | Admitting: Neurology

## 2021-03-21 NOTE — Progress Notes (Signed)
[name] (Key: [id number]) Nurtec 75MG  dispersible tablets   Form [employer] Prior Authorization Request Form [phone] NCPDP) Created 2 days ago Sent to Plan 5 hours ago Plan Response 5 hours ago Submit Clinical Questions 5 hours ago Determination Favorable 1 hour ago Message from Plan Approved. This drug has been approved. Approved quantity: 16 <> per 30 day(s). You may fill up to a 34 day supply at a retail pharmacy. You may fill up to a 90 day supply for maintenance drugs, please refer to the formulary for details. Please call the pharmacy to process your prescription claim.

## 2021-05-02 ENCOUNTER — Encounter: Payer: Self-pay | Admitting: Adult Health

## 2021-05-02 ENCOUNTER — Ambulatory Visit
Admission: RE | Admit: 2021-05-02 | Discharge: 2021-05-02 | Disposition: A | Discharge status: Home / Self Care | Payer: Medicaid Other | Source: Ambulatory Visit | Attending: Emergency Medicine | Admitting: Emergency Medicine

## 2021-05-02 ENCOUNTER — Other Ambulatory Visit: Payer: Self-pay

## 2021-05-02 VITALS — BP 109/71 | HR 118 | Temp 99.6°F | Resp 18 | Ht 64.0 in | Wt 140.0 lb

## 2021-05-02 DIAGNOSIS — J069 Acute upper respiratory infection, unspecified: Secondary | ICD-10-CM

## 2021-05-02 MED ORDER — BENZONATATE 100 MG PO CAPS: 100.0000 mg | ORAL_CAPSULE | Freq: Three times a day (TID) | ORAL | 0 refills | Status: DC | PRN
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
## 2021-05-02 NOTE — ED Triage Notes (Signed)
Patient states that she has been having a cough and congestion, sinus pain and pressure x  1 week. Patient states that she was sick after kids were sick and negative for covid.

## 2021-05-02 NOTE — Discharge Instructions (Addendum)
Take the Tessalon as needed for cough.  Continue over-the-counter treatments for your symptoms, including ibuprofen and plain Mucinex.    Follow up with your primary care provider if your symptoms are not improving.

## 2021-05-19 ENCOUNTER — Ambulatory Visit (INDEPENDENT_AMBULATORY_CARE_PROVIDER_SITE_OTHER): Payer: Medicaid Other | Admitting: Dermatology

## 2021-05-19 ENCOUNTER — Other Ambulatory Visit: Payer: Self-pay

## 2021-05-19 DIAGNOSIS — D229 Melanocytic nevi, unspecified: Secondary | ICD-10-CM

## 2021-05-19 DIAGNOSIS — D485 Neoplasm of uncertain behavior of skin: Secondary | ICD-10-CM

## 2021-05-19 DIAGNOSIS — D224 Melanocytic nevi of scalp and neck: Secondary | ICD-10-CM

## 2021-05-19 NOTE — Progress Notes (Signed)
   New Patient Visit  Subjective  Amanda [name] is a 25 y.o. female who presents for the following: irregular nevi  (New nevi on the post neck/back - patient is concerned due to fhx of skin CA). They are also sensitive to the touch and bleed when she scratches them.  The following portions of the chart were reviewed this encounter and updated as appropriate:   Tobacco  Allergies  Meds  Problems  Med Hx  Surg Hx  Fam Hx     Review of Systems:  No other skin or systemic complaints except as noted in HPI or Assessment and Plan.  Objective  Well appearing patient in no apparent distress; mood and affect are within normal limits.  A focused examination was performed including the neck and back. Relevant physical exam findings are noted in the Assessment and Plan.  Objective  R post base of neck: 0.6 cm brown papule  Objective  L base of neck: Brown papule  Assessment & Plan  Neoplasm of uncertain behavior of skin R post base of neck  Epidermal / dermal shaving  Lesion diameter (cm):  0.6 Informed consent: discussed and consent obtained   Timeout: patient name, date of birth, surgical site, and procedure verified   Procedure prep:  Patient was prepped and draped in usual sterile fashion Prep type:  Isopropyl alcohol Anesthesia: the lesion was anesthetized in a standard fashion   Anesthetic:  1% lidocaine w/ epinephrine 1-100,000 buffered w/ 8.4% NaHCO3 Instrument used: flexible razor blade   Hemostasis achieved with: pressure, aluminum chloride and electrodesiccation   Outcome: patient tolerated procedure well   Post-procedure details: sterile dressing applied and wound care instructions given   Dressing type: bandage and petrolatum    Specimen 1 - Surgical pathology Differential Diagnosis: D48.5 irritated nevus r/o dysplasia Check Margins: No 0.6 cm brown papule  Nevus L base of neck  Discussed shave removal if bothersome. Benign-appearing.  Observation.  Call  clinic for new or changing lesions.  Recommend daily use of broad spectrum spf 30+ sunscreen to sun-exposed areas.    Return if symptoms worsen or fail to improve.  Luther Redo, CMA, am acting as scribe for Sarina Ser, MD .  Documentation: I have reviewed the above documentation for accuracy and completeness, and I agree with the above.  Sarina Ser, MD

## 2021-05-19 NOTE — Patient Instructions (Signed)
If you have any questions or concerns for your doctor, please call our main line at [phone] and press option 4 to reach your doctor's medical assistant. If no one answers, please leave a voicemail as directed and we will return your call as soon as possible. Messages left after 4 pm will be answered the following business day.   You may also send us a message via MyChart. We typically respond to MyChart messages within 1-2 business days.  For prescription refills, please ask your pharmacy to contact our office. Our fax number is [phone].  If you have an urgent issue when the clinic is closed that cannot wait until the next business day, you can page your doctor at the number below.    Please note that while we do our best to be available for urgent issues outside of office hours, we are not available 24/7.   If you have an urgent issue and are unable to reach us, you may choose to seek medical care at your doctor's office, retail clinic, urgent care center, or emergency room.  If you have a medical emergency, please immediately call 911 or go to the emergency department.  Pager Numbers  - Dr. Kowalski: [phone]  - Dr. Moye: [phone]  - Dr. Stewart: [phone]  In the event of inclement weather, please call our main line at [phone] for an update on the status of any delays or closures.  Dermatology Medication Tips: Please keep the boxes that topical medications come in in order to help keep track of the instructions about where and how to use these. Pharmacies typically print the medication instructions only on the boxes and not directly on the medication tubes.   If your medication is too expensive, please contact our office at [phone] option 4 or send us a message through MyChart.   We are unable to tell what your co-pay for medications will be in advance as this is different depending on your insurance coverage. However, we may be able to find a  substitute medication at lower cost or fill out paperwork to get insurance to cover a needed medication.   If a prior authorization is required to get your medication covered by your insurance company, please allow us 1-2 business days to complete this process.  Drug prices often vary depending on where the prescription is filled and some pharmacies may offer cheaper prices.  The website www.goodrx.com contains coupons for medications through different pharmacies. The prices here do not account for what the cost may be with help from insurance (it may be cheaper with your insurance), but the website can give you the price if you did not use any insurance.  - You can print the associated coupon and take it with your prescription to the pharmacy.  - You may also stop by our office during regular business hours and pick up a GoodRx coupon card.  - If you need your prescription sent electronically to a different pharmacy, notify our office through [place] MyChart or by phone at [phone] option 4.     Wound Care Instructions  1. Cleanse wound gently with soap and water once a day then pat dry with clean gauze. Apply a thing coat of Petrolatum (petroleum jelly, "Vaseline") over the wound (unless you have an allergy to this). We recommend that you use a new, sterile tube of Vaseline. Do not pick or remove scabs. Do not remove the yellow or white "healing tissue" from the base of the wound.    2. Cover the wound with fresh, clean, nonstick gauze and secure with paper tape. You may use Band-Aids in place of gauze and tape if the would is small enough, but would recommend trimming much of the tape off as there is often too much. Sometimes Band-Aids can irritate the skin.  3. You should call the office for your biopsy report after 1 week if you have not already been contacted.  4. If you experience any problems, such as abnormal amounts of bleeding, swelling, significant bruising, significant pain,  or evidence of infection, please call the office immediately.  5. FOR ADULT SURGERY PATIENTS: If you need something for pain relief you may take 1 extra strength Tylenol (acetaminophen) AND 2 Ibuprofen (200mg each) together every 4 hours as needed for pain. (do not take these if you are allergic to them or if you have a reason you should not take them.) Typically, you may only need pain medication for 1 to 3 days.     

## 2021-05-20 ENCOUNTER — Encounter: Payer: Self-pay | Admitting: Dermatology

## 2021-05-25 ENCOUNTER — Encounter: Payer: Self-pay | Admitting: Dermatology

## 2021-05-26 ENCOUNTER — Telehealth: Payer: Self-pay

## 2021-05-26 NOTE — Telephone Encounter (Signed)
Patient informed of pathology results via [place].

## 2021-05-26 NOTE — Telephone Encounter (Signed)
-----   Message from Ralene Bathe, MD sent at 05/25/2021 11:14 AM EDT ----- Diagnosis Skin , right post base of neck MELANOCYTIC NEVUS, INTRADERMAL TYPE, BASE INVOLVED  Benign mole No further treatment needed

## 2021-05-31 NOTE — Progress Notes (Signed)
NEUROLOGY FOLLOW UP OFFICE NOTE  Amanda [name] [id number]  Assessment/Plan:   Migraine without aura  1.Migraine prevention:  Qulipta 72m daily 2.Migraine rescue:  Nurtec 3.Limit use of pain relievers to no more than 2 days out of week to prevent risk of rebound or medication-overuse headache. 4.Keep headache diary 5.Follow up 6 months  Subjective:  Amanda [name] a 25year old right-handed female who follows up for migraines.  UPDATE: Started Emgality in December.  However, the shot was too painful and she requested to try a pill.  Started Qulipta.  Headaches much improved. Intensity:  mild-moderate Duration:  10-15 minutes with Nurtec. Frequency:  1 day in last 4 weeks Frequency of abortive medication: 1 in last 4 days Takes Tylenol after lunch once daily or every other day. Current NSAIDS/analgesics:  Tylenol Current triptans:  none Current ergotamine:  none Current anti-emetic:  none Current muscle relaxants:  none Current Antihypertensive medications:  none Current Antidepressant medications:  Sertraline 597mdaily Current Anticonvulsant medications:  none Current anti-CGRP:  Qulipta 608mD, Nurtec PRN (EVERY OTHER DAY CAUSED STOMACH PAIN) Current Vitamins/Herbal/Supplements:  none Current Antihistamines/Decongestants:  Zyrtec Other therapy:  none Hormone/birth control:  none  Caffeine:  No coffee.  Sometimes a Red Bull Diet:  Hydrates with water.  Tries not to skip meals.  Occasional soda Exercise:  Walks routine Depression:  improved; Anxiety:  no Other pain:  no Sleep hygiene:  At least 6-7 hours  HISTORY:  Migraines since teenager.  They are bifrontal/facial pressure with photophobia, phonophobia, dizziness and sometimes nausea.  In February 2018, she had a migraine where she lost vision in the left eye.  MRI of brain with and without contrast on 01/29/2017 personally reviewed was normal.  They would last 4-5 hours and occur every 2 to 3 days.. Every 2 to 3  days, not as severe, 4 to 5 hours.  Improved during pregnancy in 2020 but became worse following birth of her twins.  Since late 2020, they are daily, lasting 4-5 hours (but within 30 minutes with Nurtec).  They are moderate in the morning and gradually become more severe later in the day with worsening dizziness.  She takes Tylenol once a day to every other day.       Past NSAIDS/analgesics:  Ibuprofen (contraindicated - stomach), Excedrin Past abortive triptans:  Sumatriptan (lip and tongue swelling), rizatriptan (drowsiness) Past abortive ergotamine:  none Past muscle relaxants:  none Past anti-emetic:  Zofran 4mg81mst antihypertensive medications:  none Past antidepressant medications:  Amitriptyline (drowsiness) Past anticonvulsant medications:  none Past anti-CGRP:  Emgality (painful) Past vitamins/Herbal/Supplements:  none Past antihistamines/decongestants:  none    Family history of headache:  Mom (chronic migraines)  PAST MEDICAL HISTORY: Past Medical History:  Diagnosis Date   BRCA negative 09/2020   MyRisk neg except AXIN2 VUS   Family history of adverse reaction to anesthesia    heart condition   Family history of breast cancer 09/2020   IBIS=15.7%/riskscore=14.7%   Family history of pancreatic cancer    GERD (gastroesophageal reflux disease)    Migraine     MEDICATIONS: Current Outpatient Medications on File Prior to Visit  Medication Sig Dispense Refill   Atogepant (QULIPTA) 60 MG TABS Take 60 mg by mouth daily. 30 tablet 5   benzonatate (TESSALON) 100 MG capsule Take 1 capsule (100 mg total) by mouth 3 (three) times daily as needed for cough. (Patient not taking: Reported on 05/19/2021) 21 capsule 0   cetirizine (ZYRTEC) 10  MG tablet TAKE 1 TABLET BY MOUTH EVERY DAY 30 tablet 1   Rimegepant Sulfate (NURTEC) 75 MG TBDP Take 75 mg by mouth daily as needed (maximum 1 tablet in 24 hours.). 16 tablet 5   valACYclovir (VALTREX) 500 MG tablet Take 1 tablet (500 mg total)  by mouth daily. 90 tablet 3   [DISCONTINUED] dicyclomine (BENTYL) 20 MG tablet Take 1 tablet (20 mg total) by mouth 2 (two) times daily. (Patient not taking: No sig reported) 20 tablet 0   [DISCONTINUED] norethindrone (MICRONOR) 0.35 MG tablet Take 1 tablet (0.35 mg total) by mouth daily. (Patient not taking: No sig reported) 30 tablet 6   [DISCONTINUED] omeprazole (PRILOSEC) 40 MG capsule Take 1 capsule (40 mg total) by mouth in the morning and at bedtime. (Patient not taking: Reported on 01/04/2021) 60 capsule 2   [DISCONTINUED] sertraline (ZOLOFT) 50 MG tablet Take 1 tablet (50 mg total) by mouth daily. (Patient not taking: Reported on 01/04/2021) 30 tablet 11   No current facility-administered medications on file prior to visit.    ALLERGIES: Allergies  Allergen Reactions   Imitrex [Sumatriptan] Swelling    Facial swelling and burning lips   Penicillins Hives    Did it involve swelling of the face/tongue/throat, SOB, or low BP? No Did it involve sudden or severe rash/hives, skin peeling, or any reaction on the inside of your mouth or nose? Yes Did you need to seek medical attention at a hospital or doctor's office? Yes When did it last happen?      childhood allergy If all above answers are "NO", may proceed with cephalosporin use.    FAMILY HISTORY: Family History  Problem Relation Age of Onset   Migraines Mother    Skin cancer Father    Asthma Maternal Grandmother    Hypertension Maternal Grandmother    Thyroid disease Maternal Grandmother    Hypertension Maternal Grandfather    Pancreatic cancer Maternal Grandfather 84   Colon cancer Maternal Grandfather 60   Hypertension Paternal Grandmother    Diabetes Paternal Grandmother    Hypertension Paternal Grandfather    Diabetes Paternal Grandfather    Colon cancer Paternal Grandfather 8   Thyroid disease Maternal Aunt    Breast cancer Maternal Aunt 45   Allergies Brother       Objective:  Blood pressure 104/65, pulse 87,  height 5' 4"  (1.626 m), weight 143 lb 6.4 oz (65 kg), SpO2 97 %, not currently breastfeeding. General: No acute distress.  Patient appears well-groomed.    Metta Clines, DO  CC: [name], FNP

## 2021-06-02 ENCOUNTER — Other Ambulatory Visit: Payer: Self-pay

## 2021-06-02 ENCOUNTER — Ambulatory Visit: Payer: Medicaid Other | Admitting: Neurology

## 2021-06-02 ENCOUNTER — Encounter: Payer: Self-pay | Admitting: Neurology

## 2021-06-02 VITALS — BP 104/65 | HR 87 | Ht 64.0 in | Wt 143.4 lb

## 2021-06-02 DIAGNOSIS — G43009 Migraine without aura, not intractable, without status migrainosus: Secondary | ICD-10-CM | POA: Diagnosis not present

## 2021-06-11 ENCOUNTER — Encounter: Payer: Self-pay | Admitting: Adult Health

## 2021-06-29 ENCOUNTER — Other Ambulatory Visit: Payer: Self-pay | Admitting: Adult Health

## 2021-07-04 NOTE — Telephone Encounter (Signed)
We can order USG gall bladder which is due in 10/2021 .

## 2021-07-05 ENCOUNTER — Other Ambulatory Visit: Payer: Self-pay

## 2021-07-05 DIAGNOSIS — K824 Cholesterolosis of gallbladder: Secondary | ICD-10-CM

## 2021-07-14 ENCOUNTER — Ambulatory Visit: Payer: Medicaid Other | Admitting: Family Medicine

## 2021-08-05 ENCOUNTER — Other Ambulatory Visit: Payer: Self-pay | Admitting: Obstetrics & Gynecology

## 2021-08-08 ENCOUNTER — Ambulatory Visit (INDEPENDENT_AMBULATORY_CARE_PROVIDER_SITE_OTHER): Payer: Medicaid Other

## 2021-08-08 ENCOUNTER — Other Ambulatory Visit: Payer: Self-pay

## 2021-08-08 DIAGNOSIS — R3915 Urgency of urination: Secondary | ICD-10-CM

## 2021-08-08 DIAGNOSIS — R350 Frequency of micturition: Secondary | ICD-10-CM

## 2021-08-08 DIAGNOSIS — R339 Retention of urine, unspecified: Secondary | ICD-10-CM

## 2021-08-08 LAB — POCT URINALYSIS DIPSTICK
Blood, UA: NEGATIVE
Glucose, UA: NEGATIVE
Ketones, UA: NEGATIVE
Nitrite, UA: NEGATIVE
Protein, UA: POSITIVE — AB
Spec Grav, UA: >=1.03 — AB (ref 1.010–1.025)
Urobilinogen, UA: 0.2 E.U./dL
pH, UA: 5 (ref 5.0–8.0)

## 2021-08-08 NOTE — Telephone Encounter (Signed)
Can you add to nurse schedule

## 2021-08-08 NOTE — Addendum Note (Signed)
Addended by: Cleophas Dunker D on: 08/08/2021 02:39 PM   Modules accepted: Orders

## 2021-08-08 NOTE — Progress Notes (Signed)
Pt dropped off a CCU to ck for UTI; sxs are:  frequency, lower back pain, bad lower pelvic pain, doesn't feel like she completely empties when she does go. Pls see u/a results; will send for Culture.

## 2021-08-09 ENCOUNTER — Other Ambulatory Visit: Payer: Self-pay | Admitting: Obstetrics & Gynecology

## 2021-08-09 MED ORDER — SULFAMETHOXAZOLE-TRIMETHOPRIM 800-160 MG PO TABS: 1.0000 | ORAL_TABLET | Freq: Two times a day (BID) | ORAL | 0 refills | Status: AC

## 2021-08-10 LAB — URINE CULTURE

## 2021-08-10 NOTE — Telephone Encounter (Signed)
Per message this am, patient has already picked up and started abx.

## 2021-08-16 ENCOUNTER — Other Ambulatory Visit (HOSPITAL_COMMUNITY)
Admission: RE | Admit: 2021-08-16 | Discharge: 2021-08-16 | Disposition: A | Discharge status: Home / Self Care | Payer: Medicaid Other | Source: Ambulatory Visit | Attending: Obstetrics & Gynecology | Admitting: Obstetrics & Gynecology

## 2021-08-16 ENCOUNTER — Ambulatory Visit (INDEPENDENT_AMBULATORY_CARE_PROVIDER_SITE_OTHER): Payer: Medicaid Other | Admitting: Obstetrics & Gynecology

## 2021-08-16 ENCOUNTER — Other Ambulatory Visit: Payer: Self-pay

## 2021-08-16 ENCOUNTER — Encounter: Payer: Self-pay | Admitting: Obstetrics & Gynecology

## 2021-08-16 VITALS — BP 120/80 | Ht 64.0 in | Wt 143.0 lb

## 2021-08-16 DIAGNOSIS — N870 Mild cervical dysplasia: Secondary | ICD-10-CM | POA: Insufficient documentation

## 2021-08-16 DIAGNOSIS — Z01419 Encounter for gynecological examination (general) (routine) without abnormal findings: Secondary | ICD-10-CM

## 2021-08-16 DIAGNOSIS — R350 Frequency of micturition: Secondary | ICD-10-CM | POA: Diagnosis not present

## 2021-08-16 DIAGNOSIS — R3915 Urgency of urination: Secondary | ICD-10-CM

## 2021-08-16 MED ORDER — TOLTERODINE TARTRATE ER 2 MG PO CP24: 2.0000 mg | ORAL_CAPSULE | Freq: Every day | ORAL | 5 refills | Status: DC

## 2021-08-16 NOTE — Progress Notes (Signed)
HPI:      [name] is a 25 y.o. G1P0102 who LMP was Patient's last menstrual period was 08/11/2021., she presents today for her annual examination. The patient has no complaints today. The patient is sexually active. Her last pap: approximate date 02/2020 and was normal and prior PAP was LGSIL w CIN I by biopsy . The patient does perform self breast exams.  There is no notable family history of breast or ovarian cancer in her family.  The patient has regular exercise: yes.  The patient denies current symptoms of depression.    GYN History: Contraception: vasectomy  PMHx: Past Medical History:  Diagnosis Date   BRCA negative 09/2020   MyRisk neg except AXIN2 VUS   Family history of adverse reaction to anesthesia    heart condition   Family history of breast cancer 09/2020   IBIS=15.7%/riskscore=14.7%   Family history of pancreatic cancer    GERD (gastroesophageal reflux disease)    Migraine    Past Surgical History:  Procedure Laterality Date   NO PAST SURGERIES     WISDOM TOOTH EXTRACTION     Family History  Problem Relation Age of Onset   Migraines Mother    Skin cancer Father    Asthma Maternal Grandmother    Hypertension Maternal Grandmother    Thyroid disease Maternal Grandmother    Hypertension Maternal Grandfather    Pancreatic cancer Maternal Grandfather 46   Colon cancer Maternal Grandfather 60   Hypertension Paternal Grandmother    Diabetes Paternal Grandmother    Hypertension Paternal Grandfather    Diabetes Paternal Grandfather    Colon cancer Paternal Grandfather 9   Thyroid disease Maternal Aunt    Breast cancer Maternal Aunt 22   Allergies Brother    Social History   Tobacco Use   Smoking status: Never   Smokeless tobacco: Never  Vaping Use   Vaping Use: Never used  Substance Use Topics   Alcohol use: Never   Drug use: No    Current Outpatient Medications:    Atogepant (QULIPTA) 60 MG TABS, Take 60 mg by mouth daily., Disp: 30 tablet,  Rfl: 5   Rimegepant Sulfate (NURTEC) 75 MG TBDP, Take 75 mg by mouth daily as needed (maximum 1 tablet in 24 hours.)., Disp: 16 tablet, Rfl: 5   sertraline (ZOLOFT) 50 MG tablet, TAKE 1 TABLET BY MOUTH EVERY DAY, Disp: 30 tablet, Rfl: 0   tolterodine (DETROL LA) 2 MG 24 hr capsule, Take 1 capsule (2 mg total) by mouth daily., Disp: 30 capsule, Rfl: 5   valACYclovir (VALTREX) 500 MG tablet, Take 1 tablet (500 mg total) by mouth daily., Disp: 90 tablet, Rfl: 3   cetirizine (ZYRTEC) 10 MG tablet, TAKE 1 TABLET BY MOUTH EVERY DAY, Disp: 30 tablet, Rfl: 1 Allergies: Imitrex [sumatriptan] and Penicillins  ROS  Objective: BP 120/80   Ht _0  (1.626 m)   Wt 143 lb (64.9 kg)   LMP 08/11/2021   BMI 24.55 kg/m   Filed Weights   08/16/21 0942  Weight: 143 lb (64.9 kg)   Body mass index is 24.55 kg/m. OBGyn Exam  Assessment:  ANNUAL EXAM 1. Women's annual routine gynecological examination   2. CIN I (cervical intraepithelial neoplasia I)   3. Urinary frequency   4. Urinary urgency      Screening Plan:            1.  Cervical Screening-  Pap smear done today  2. Labs managed by  PCP  3. Counseling for contraception: vasectomy  [facility] - 08/16/21 0944       Pregnancy Intention Screening   Does the patient want to become pregnant in the next year? No    Does the patient's partner want to become pregnant in the next year? No    Would the patient like to discuss contraceptive options today? No      Contraception Wrap Up   Current Method Vasectomy    End Method Vasectomy    Contraception Counseling Provided No            4. Urinary frequency Urinary urgency Options discussed, likely needs bladder training Medicine may help, temporary plan of use - tolterodine (DETROL LA) 2 MG 24 hr capsule; Take 1 capsule (2 mg total) by mouth daily.  Dispense: 30 capsule; Refill: 5      F/U  Return in about 1 year (around 08/16/2022) for Annual.  Barnett Applebaum, MD, Loura Pardon  Ob/Gyn, [facility] 08/16/2021  10:17 AM

## 2021-08-16 NOTE — Patient Instructions (Signed)
Baby Aspirin daily, for pelvic pain Detrol for bladder, see below  Tolterodine extended-release capsules What is this medication? TOLTERODINE (tole TER a deen) is used to treat overactive bladder. This medicine reduces the amount of bathroom visits. It may also help to controlwetting accidents. This medicine may be used for other purposes; ask your health care provider orpharmacist if you have questions. COMMON BRAND NAME(S): Detrol LA What should I tell my care team before I take this medication? They need to know if you have any of these conditions: difficulty passing urine glaucoma intestinal obstruction irregular heartbeat or you have a family member with irregular heartbeat kidney disease liver disease myasthenia gravis an unusual or allergic reaction to tolterodine, fesoterodine, other medicines, foods, dyes, or preservatives pregnant or trying to get pregnant breast-feeding How should I use this medication? Take this medicine by mouth with a glass of water. Swallow whole, do not crush, cut, or chew. Follow the directions on the prescription label. Take your dosesat regular intervals. Do not take your medicine more often than directed. Talk to your pediatrician regarding the use of this medicine in children.Special care may be needed. Overdosage: If you think you have taken too much of this medicine contact apoison control center or emergency room at once. NOTE: This medicine is only for you. Do not share this medicine with others. What if I miss a dose? If you miss a dose, take it as soon as you can. If it is almost time for yournext dose, take only that dose. Do not take double or extra doses. What may interact with this medication? clarithromycin cyclosporine erythromycin fluoxetine medicines for fungal infections, like fluconazole, itraconazole, ketoconazole or voriconazole medicines for memory problems like galantamine, donepezil, tacrine vinblastine This list may not  describe all possible interactions. Give your health care provider a list of all the medicines, herbs, non-prescription drugs, or dietary supplements you use. Also tell them if you smoke, drink alcohol, or use illegaldrugs. Some items may interact with your medicine. What should I watch for while using this medication? It may take 2 or 3 months to notice the full benefit from this medicine. Your health care professional may also recommend techniques that may help improve control of your bladder and sphincter muscles. These techniques will help youneed the bathroom less frequently. You may need to limit your intake tea, coffee, caffeinated sodas, and alcohol. These drinks may make your symptoms worse. Keeping healthy bowel habits may lessen bladder symptoms. If you currently smoke, quitting smoking may helpreduce irritation to the bladder muscle. You may get drowsy or dizzy. Do not drive, use machinery, or do anything that needs mental alertness until you know how this drug affects you. Do not stand or sit up quickly, especially if you are an older patient. This reduces therisk of dizzy or fainting spells. Your mouth may get dry. Chewing sugarless gum or sucking hard candy, anddrinking plenty of water, will help. This medicine may cause dry eyes and blurred vision. If you wear contact lenses you may feel some discomfort. Lubricating drops may help. See your eye doctorif the problem does not go away or is severe. What side effects may I notice from receiving this medication? Side effects that you should report to your doctor or health care professionalas soon as possible: allergic reactions like skin rash, itching or hives, swelling of the face, lips, or tongue breathing problems confusion difficulty passing urine fast, irregular heartbeat hallucinations memory problems swelling in feet, hands Side effects that usually do  not require medical attention (report to yourdoctor or health care professional  if they continue or are bothersome): changes in vision constipation dry eyes, mouth headache dizziness, drowsiness stomach upset This list may not describe all possible side effects. Call your doctor for medical advice about side effects. You may report side effects to FDA at1-800-FDA-1088. Where should I keep my medication? Keep out of the reach of children. Store at room temperature between 15 and 30 degrees C (59 and 86 degrees F).Protect from light. Throw away any unused medicine after the expiration date. NOTE: This sheet is a summary. It may not cover all possible information. If you have questions about this medicine, talk to your doctor, pharmacist, orhealth care provider.  2022 Elsevier/Gold Standard (2010-09-13 17:20:26)

## 2021-08-17 ENCOUNTER — Other Ambulatory Visit: Payer: Self-pay | Admitting: Obstetrics & Gynecology

## 2021-08-17 LAB — CYTOLOGY - PAP
Chlamydia: NEGATIVE
Comment: NEGATIVE
Comment: NEGATIVE
Comment: NORMAL
Diagnosis: UNDETERMINED — AB
Neisseria Gonorrhea: NEGATIVE
Trichomonas: NEGATIVE

## 2021-08-17 MED ORDER — OXYBUTYNIN CHLORIDE ER 5 MG PO TB24: 5.0000 mg | ORAL_TABLET | Freq: Every day | ORAL | 6 refills | Status: DC

## 2021-08-18 ENCOUNTER — Encounter: Payer: Self-pay | Admitting: Obstetrics and Gynecology

## 2021-08-18 ENCOUNTER — Telehealth: Payer: Self-pay

## 2021-08-18 DIAGNOSIS — Z113 Encounter for screening for infections with a predominantly sexual mode of transmission: Secondary | ICD-10-CM

## 2021-08-18 NOTE — Telephone Encounter (Signed)
-----   Message from Gae Dry, MD sent at 08/18/2021  8:36 AM EDT ----- Sch follow up (for PAP) in 6 months please.  MyChart message sent to pt regarding slightly abnormal PAP and need for follow up every 6 months.

## 2021-08-18 NOTE — Telephone Encounter (Signed)
Patient is scheduled for February 15, 2022. Patient would like a call please.

## 2021-08-18 NOTE — Progress Notes (Signed)
Sch follow up (for PAP) in 6 months please.  MyChart message sent to pt regarding slightly abnormal PAP and need for follow up every 6 months.

## 2021-08-20 ENCOUNTER — Other Ambulatory Visit: Payer: Self-pay | Admitting: Obstetrics and Gynecology

## 2021-08-20 DIAGNOSIS — A6000 Herpesviral infection of urogenital system, unspecified: Secondary | ICD-10-CM

## 2021-08-20 MED ORDER — VALACYCLOVIR HCL 1 G PO TABS: 1000.0000 mg | ORAL_TABLET | Freq: Every day | ORAL | 2 refills | Status: DC

## 2021-09-12 ENCOUNTER — Other Ambulatory Visit: Payer: Self-pay | Admitting: Obstetrics & Gynecology

## 2021-09-15 ENCOUNTER — Other Ambulatory Visit: Payer: Self-pay

## 2021-09-15 MED ORDER — QULIPTA 60 MG PO TABS: 60.0000 mg | ORAL_TABLET | Freq: Every day | ORAL | 5 refills | Status: DC

## 2021-09-19 ENCOUNTER — Telehealth: Payer: Self-pay

## 2021-09-19 NOTE — Telephone Encounter (Signed)
Copied from [place] [id number]. Topic: Appointment Scheduling - Scheduling Inquiry for Clinic >> Sep 19, 2021 10:22 AM Rayann Heman wrote: Reason for CRM:Pt called and stated that she would like to schedule a appointment. She was seen last in 2021. Please advise

## 2021-09-21 ENCOUNTER — Ambulatory Visit: Payer: Medicaid Other | Admitting: Family Medicine

## 2021-09-21 NOTE — Progress Notes (Deleted)
      Established patient visit   Patient: Amanda [name]   DOB: 12/14/1996   25 y.o. Female  MRN: [id number] Visit Date: 09/21/2021  Today's healthcare provider: Wilhemena Durie, MD   No chief complaint on file.  Subjective    HPI  ***  {Link to patient history deactivated due to formatting error:1}  Medications: Outpatient Medications Prior to Visit  Medication Sig   Atogepant (QULIPTA) 60 MG TABS Take 60 mg by mouth daily.   cetirizine (ZYRTEC) 10 MG tablet TAKE 1 TABLET BY MOUTH EVERY DAY   oxybutynin (DITROPAN-XL) 5 MG 24 hr tablet Take 1 tablet (5 mg total) by mouth at bedtime.   Rimegepant Sulfate (NURTEC) 75 MG TBDP Take 75 mg by mouth daily as needed (maximum 1 tablet in 24 hours.).   sertraline (ZOLOFT) 50 MG tablet TAKE 1 TABLET BY MOUTH EVERY DAY   valACYclovir (VALTREX) 1000 MG tablet Take 1 tablet (1,000 mg total) by mouth daily.   No facility-administered medications prior to visit.    Review of Systems  Constitutional:  Negative for appetite change, chills, fatigue and fever.  Respiratory:  Negative for chest tightness and shortness of breath.   Cardiovascular:  Negative for chest pain and palpitations.  Gastrointestinal:  Negative for abdominal pain, nausea and vomiting.  Neurological:  Negative for dizziness and weakness.   {Labs  Heme  Chem  Endocrine  Serology  Results Review (optional):23779}   Objective    There were no vitals taken for this visit. {Show previous vital signs (optional):23777}  Physical Exam  ***  No results found for any visits on 09/21/21.  Assessment & Plan     ***  No follow-ups on file.      {provider attestation***:1}   Wilhemena Durie, MD  [facility] [phone] (phone) [phone] (fax)  [place]

## 2021-10-03 ENCOUNTER — Ambulatory Visit: Payer: Medicaid Other | Admitting: Family Medicine

## 2021-10-21 ENCOUNTER — Ambulatory Visit
Admission: RE | Admit: 2021-10-21 | Discharge: 2021-10-21 | Disposition: A | Discharge status: Home / Self Care | Payer: Medicaid Other | Source: Ambulatory Visit | Attending: Gastroenterology | Admitting: Gastroenterology

## 2021-10-21 ENCOUNTER — Other Ambulatory Visit: Payer: Self-pay

## 2021-10-21 DIAGNOSIS — K824 Cholesterolosis of gallbladder: Secondary | ICD-10-CM | POA: Insufficient documentation

## 2021-10-27 ENCOUNTER — Other Ambulatory Visit: Payer: Self-pay

## 2021-10-27 ENCOUNTER — Ambulatory Visit (INDEPENDENT_AMBULATORY_CARE_PROVIDER_SITE_OTHER): Payer: Medicaid Other | Admitting: Dermatology

## 2021-10-27 DIAGNOSIS — M542 Cervicalgia: Secondary | ICD-10-CM

## 2021-10-27 DIAGNOSIS — D2262 Melanocytic nevi of left upper limb, including shoulder: Secondary | ICD-10-CM

## 2021-10-27 DIAGNOSIS — G4486 Cervicogenic headache: Secondary | ICD-10-CM

## 2021-10-27 DIAGNOSIS — D485 Neoplasm of uncertain behavior of skin: Secondary | ICD-10-CM

## 2021-10-27 NOTE — Progress Notes (Signed)
Per Dr.Jaffe, He would like to refer her to Dr. Teresa Coombs at [place] - he is a sports medicine doctor who performs manipulation.  Referral added

## 2021-10-27 NOTE — Progress Notes (Signed)
   Follow-Up Visit   Subjective  Amanda [name] is a 25 y.o. female who presents for the following: Nevus (Left post shoulder - gets irritated and gets caught on clothes).  The following portions of the chart were reviewed this encounter and updated as appropriate:   Tobacco  Allergies  Meds  Problems  Med Hx  Surg Hx  Fam Hx     Review of Systems:  No other skin or systemic complaints except as noted in HPI or Assessment and Plan.  Objective  Well appearing patient in no apparent distress; mood and affect are within normal limits.  A focused examination was performed including back. Relevant physical exam findings are noted in the Assessment and Plan.  Left Shoulder - Posterior 0.6 cm brown macule        Assessment & Plan  Neoplasm of uncertain behavior of skin Left Shoulder - Posterior  Epidermal / dermal shaving  Lesion diameter (cm):  0.6 Informed consent: discussed and consent obtained   Timeout: patient name, date of birth, surgical site, and procedure verified   Procedure prep:  Patient was prepped and draped in usual sterile fashion Prep type:  Isopropyl alcohol Anesthesia: the lesion was anesthetized in a standard fashion   Anesthetic:  1% lidocaine w/ epinephrine 1-100,000 buffered w/ 8.4% NaHCO3 Instrument used: flexible razor blade   Hemostasis achieved with: pressure, aluminum chloride and electrodesiccation   Outcome: patient tolerated procedure well   Post-procedure details: sterile dressing applied and wound care instructions given   Dressing type: bandage and petrolatum    Specimen 1 - Surgical pathology Differential Diagnosis: Irritated nevus R/O dysplasia Check Margins: No  Return if symptoms worsen or fail to improve.  I, Ashok Cordia, CMA, am acting as scribe for Sarina Ser, MD . Documentation: I have reviewed the above documentation for accuracy and completeness, and I agree with the above.  Sarina Ser, MD

## 2021-10-27 NOTE — Patient Instructions (Signed)
Wound Care Instructions  Cleanse wound gently with soap and water once a day then pat dry with clean gauze. Apply a thing coat of Petrolatum (petroleum jelly, "Vaseline") over the wound (unless you have an allergy to this). We recommend that you use a new, sterile tube of Vaseline. Do not pick or remove scabs. Do not remove the yellow or white "healing tissue" from the base of the wound.  Cover the wound with fresh, clean, nonstick gauze and secure with paper tape. You may use Band-Aids in place of gauze and tape if the would is small enough, but would recommend trimming much of the tape off as there is often too much. Sometimes Band-Aids can irritate the skin.  You should call the office for your biopsy report after 1 week if you have not already been contacted.  If you experience any problems, such as abnormal amounts of bleeding, swelling, significant bruising, significant pain, or evidence of infection, please call the office immediately.  FOR ADULT SURGERY PATIENTS: If you need something for pain relief you may take 1 extra strength Tylenol (acetaminophen) AND 2 Ibuprofen (200mg each) together every 4 hours as needed for pain. (do not take these if you are allergic to them or if you have a reason you should not take them.) Typically, you may only need pain medication for 1 to 3 days.   If you have any questions or concerns for your doctor, please call our main line at [phone] and press option 4 to reach your doctor's medical assistant. If no one answers, please leave a voicemail as directed and we will return your call as soon as possible. Messages left after 4 pm will be answered the following business day.   You may also send us a message via MyChart. We typically respond to MyChart messages within 1-2 business days.  For prescription refills, please ask your pharmacy to contact our office. Our fax number is [phone].  If you have an urgent issue when the clinic is closed that  cannot wait until the next business day, you can page your doctor at the number below.    Please note that while we do our best to be available for urgent issues outside of office hours, we are not available 24/7.   If you have an urgent issue and are unable to reach us, you may choose to seek medical care at your doctor's office, retail clinic, urgent care center, or emergency room.  If you have a medical emergency, please immediately call 911 or go to the emergency department.  Pager Numbers  - Dr. Kowalski: [phone]  - Dr. Moye: [phone]  - Dr. Stewart: [phone]  In the event of inclement weather, please call our main line at [phone] for an update on the status of any delays or closures.  Dermatology Medication Tips: Please keep the boxes that topical medications come in in order to help keep track of the instructions about where and how to use these. Pharmacies typically print the medication instructions only on the boxes and not directly on the medication tubes.   If your medication is too expensive, please contact our office at [phone] option 4 or send us a message through MyChart.   We are unable to tell what your co-pay for medications will be in advance as this is different depending on your insurance coverage. However, we may be able to find a substitute medication at lower cost or fill out paperwork to get insurance to cover a needed   medication.   If a prior authorization is required to get your medication covered by your insurance company, please allow us 1-2 business days to complete this process.  Drug prices often vary depending on where the prescription is filled and some pharmacies may offer cheaper prices.  The website www.goodrx.com contains coupons for medications through different pharmacies. The prices here do not account for what the cost may be with help from insurance (it may be cheaper with your insurance), but the website can give you the  price if you did not use any insurance.  - You can print the associated coupon and take it with your prescription to the pharmacy.  - You may also stop by our office during regular business hours and pick up a GoodRx coupon card.  - If you need your prescription sent electronically to a different pharmacy, notify our office through [facility] MyChart or by phone at [phone] option 4.   

## 2021-11-01 ENCOUNTER — Encounter: Payer: Self-pay | Admitting: Dermatology

## 2021-11-01 ENCOUNTER — Other Ambulatory Visit: Payer: Self-pay

## 2021-11-01 DIAGNOSIS — M542 Cervicalgia: Secondary | ICD-10-CM

## 2021-11-01 NOTE — Progress Notes (Unsigned)
Per Dr.Jaffe , OK to refer to [facility] for cervicalgia.  Referral added.

## 2021-11-03 ENCOUNTER — Other Ambulatory Visit: Payer: Self-pay | Admitting: Neurology

## 2021-11-03 ENCOUNTER — Other Ambulatory Visit: Payer: Self-pay

## 2021-11-03 ENCOUNTER — Ambulatory Visit: Payer: Medicaid Other | Admitting: Family Medicine

## 2021-11-03 ENCOUNTER — Ambulatory Visit (INDEPENDENT_AMBULATORY_CARE_PROVIDER_SITE_OTHER): Payer: Medicaid Other | Admitting: Physician Assistant

## 2021-11-03 ENCOUNTER — Encounter: Payer: Self-pay | Admitting: Physician Assistant

## 2021-11-03 ENCOUNTER — Telehealth: Payer: Self-pay

## 2021-11-03 VITALS — BP 105/67 | HR 96 | Resp 16 | Ht 64.0 in | Wt 147.7 lb

## 2021-11-03 DIAGNOSIS — M542 Cervicalgia: Secondary | ICD-10-CM

## 2021-11-03 DIAGNOSIS — F411 Generalized anxiety disorder: Secondary | ICD-10-CM

## 2021-11-03 DIAGNOSIS — F321 Major depressive disorder, single episode, moderate: Secondary | ICD-10-CM | POA: Insufficient documentation

## 2021-11-03 DIAGNOSIS — M62838 Other muscle spasm: Secondary | ICD-10-CM | POA: Diagnosis not present

## 2021-11-03 DIAGNOSIS — J358 Other chronic diseases of tonsils and adenoids: Secondary | ICD-10-CM

## 2021-11-03 DIAGNOSIS — Z8659 Personal history of other mental and behavioral disorders: Secondary | ICD-10-CM | POA: Insufficient documentation

## 2021-11-03 DIAGNOSIS — Z23 Encounter for immunization: Secondary | ICD-10-CM

## 2021-11-03 MED ORDER — CELECOXIB 100 MG PO CAPS: 100.0000 mg | ORAL_CAPSULE | Freq: Two times a day (BID) | ORAL | 0 refills | Status: DC

## 2021-11-03 MED ORDER — VENLAFAXINE HCL ER 37.5 MG PO CP24: ORAL_CAPSULE | ORAL | 0 refills | Status: DC

## 2021-11-03 MED ORDER — CYCLOBENZAPRINE HCL 5 MG PO TABS: 5.0000 mg | ORAL_TABLET | Freq: Three times a day (TID) | ORAL | 0 refills | Status: DC | PRN

## 2021-11-03 NOTE — Patient Instructions (Addendum)
Taper of Zoloft: Take 25 mg (cut in half) for 1 week. And then alternate every other day for 1 week.  Then start 37.5 mg Effexor daily-- after 7 days can increase to 75 mg (two pills).   I sent in the referral for a therapist as all the local places I know don't take your specific plan when I checked. Someone from the referral department will give you a call to coordinate.

## 2021-11-03 NOTE — Progress Notes (Signed)
Established patient visit   Patient: Amanda [name]   DOB: December 09, 1996   25 y.o. Female  MRN: [id number] Visit Date: 11/03/2021  Today's healthcare provider: [name], PA-C   Chief Complaint  Patient presents with   Sore Throat    Subjective    HPI  Amanda [name] is a 25 y/o female who presents today with complaints of chronic bilateral tonsil stones for the last year.  She states she will get these tonsil stones or sore red tonsils even when she does not have a cold.  It can be very painful and she has difficulty swallowing speaking.  Reports the stones of been occurring more frequently over the last few months.  She states she had frequent strep throat as a kid.  Denies any current fever, chills, nasal congestion.  She also reports acute neck pain that starts at the base of her skull and travels down the sides of her neck.  Denies any recent trauma or accident.  She gets chronic migraines and this pain is exacerbating her headaches.  She is not supposed to take any Tylenol as it can cause rebound headaches.  She states she has been using ice.  Reports 1 brief episode of dizziness due to this pain, but is not dizzy at rest.  No changes in vision.  She also occasionally feels a lymph node under her left armpit that occasionally swells up when she gets sick.  Sometimes this happens when she is not been sick as well.  Reports it is not swollen today.  Reports she very occasionally does breast exams but has not had an official exam in a long time.  Denies any breast abnormalities.  She was started on Zoloft when she was experiencing postpartum depression.  She has twin girls who are 22 years old and when they were born they were in the NICU for a month.  She has been taking the Zoloft 50 mg since then but feels it never helped and that her anxiety and depressive symptoms are worse than they were previously.  She feels she is very fatigued, sleeping more, no drive to do anything.  Denies any  suicidal ideations.  Medications: Outpatient Medications Prior to Visit  Medication Sig   Atogepant (QULIPTA) 60 MG TABS Take 60 mg by mouth daily.   cetirizine (ZYRTEC) 10 MG tablet TAKE 1 TABLET BY MOUTH EVERY DAY   oxybutynin (DITROPAN-XL) 5 MG 24 hr tablet Take 1 tablet (5 mg total) by mouth at bedtime.   Rimegepant Sulfate (NURTEC) 75 MG TBDP Take 75 mg by mouth daily as needed (maximum 1 tablet in 24 hours.).   valACYclovir (VALTREX) 1000 MG tablet Take 1 tablet (1,000 mg total) by mouth daily.   [DISCONTINUED] sertraline (ZOLOFT) 50 MG tablet TAKE 1 TABLET BY MOUTH EVERY DAY   No facility-administered medications prior to visit.    Review of Systems  Constitutional:  Positive for fatigue. Negative for fever.  HENT:  Positive for mouth sores, sore throat, trouble swallowing and voice change.   Eyes:  Negative for visual disturbance.  Respiratory:  Negative for cough and shortness of breath.   Musculoskeletal:  Positive for neck pain.  Neurological:  Positive for headaches.  Hematological:  Positive for adenopathy.  All other systems reviewed and are negative.    Objective    BP 105/67 (BP Location: Left Arm, Patient Position: Sitting, Cuff Size: Normal)   Pulse 96   Resp 16   Ht 5\' 4"  (1.626  m)   Wt 147 lb 11.2 oz (67 kg)   BMI 25.35 kg/m   Physical Exam Constitutional:      Appearance: She is well-developed. She is not ill-appearing.  HENT:     Mouth/Throat:     Pharynx: Oropharynx is clear. No posterior oropharyngeal erythema.     Tonsils: No tonsillar exudate or tonsillar abscesses. 1+ on the right. 1+ on the left.     Comments: Small tonsillar stone right, but large cavitations bilateral tonsils. Bilateral tonsils erythematous. Cardiovascular:     Rate and Rhythm: Normal rate.  Pulmonary:     Effort: Pulmonary effort is normal.  Chest:     Comments: Bilateral breasts with fibrocystic densities, no dominant mass, no erythema, nipple  discharge.  Lymphadenopathy:     Upper Body:     Left upper body: No axillary adenopathy.  Neurological:     Mental Status: She is alert.     No results found for any visits on 11/03/21.  Assessment & Plan     Problem List Items Addressed This Visit       Musculoskeletal and Integument   Neck muscle spasm    Discussed heat/ice. She will look into getting a TENS unit, explained how to use and its purpose Advised Celebrex 100 mg BID x 10 days for pain relief and Flexeril 5 mg up to TID x 10 days. I advised that flexeril can cause drowsiness, she will try nightly at first.  She will mention this episode to her neurologist, they discussed potential referral to PT.      Relevant Medications   cyclobenzaprine (FLEXERIL) 5 MG tablet     Other   Depression, major, single episode, moderate ([place])    At first classified as post partum depression but her children are 2 y/o PHQ 9 55.  We discussed changing her zoloft to effexor. I discussed tapering off the zoloft, and starting the effexor at 37.5 mg daily with the potential to increase to 75 mg daily. We will f/u 6-8 weeks.  She has been trying to find a therapist but has difficulties due to her insurance. Will send referral.       Relevant Medications   venlafaxine XR (EFFEXOR XR) 37.5 MG 24 hr capsule   Other Relevant Orders   Ambulatory referral to Psychology   GAD (generalized anxiety disorder)   Relevant Medications   venlafaxine XR (EFFEXOR XR) 37.5 MG 24 hr capsule   Other Relevant Orders   Ambulatory referral to Psychology   History of affective disorder   Relevant Orders   Ambulatory referral to Psychology   Other Visit Diagnoses     Adenoid vegetations    -  Primary   Relevant Orders   Ambulatory referral to ENT   Neck pain       Relevant Medications   celecoxib (CELEBREX) 100 MG capsule   cyclobenzaprine (FLEXERIL) 5 MG tablet   Need for influenza vaccination       Relevant Orders   Flu Vaccine QUAD 45mo+IM  (Fluarix, Fluzone & Alfiuria Quad PF) (Completed)      Left axillary lymphadenopathy No current adenopathy. She will monitor and if it returns, we can do an [place].  No abnormalities on breast exam, she will self-check.  Return in about 6 weeks (around 12/15/2021) for anxiety, depression.      I, [name], PA-C have reviewed all documentation for this visit. The documentation on  11/03/2021 for the exam, diagnosis, procedures, and orders are all  accurate and complete.    [name], PA-C  [facility] [phone] (phone) [phone] (fax)  [place]

## 2021-11-03 NOTE — Telephone Encounter (Signed)
-----   Message from Ralene Bathe, MD sent at 10/31/2021 10:24 AM EST ----- Diagnosis Skin , left shoulder- posterior MELANOCYTIC NEVUS WITH HYPERPIGMENTATION, IRRITATED  Benign irritated mole No further treatment needed

## 2021-11-03 NOTE — Assessment & Plan Note (Signed)
At first classified as post partum depression but her children are 23 y/o PHQ 9 68.  We discussed changing her zoloft to effexor. I discussed tapering off the zoloft, and starting the effexor at 37.5 mg daily with the potential to increase to 75 mg daily. We will f/u 6-8 weeks.  She has been trying to find a therapist but has difficulties due to her insurance. Will send referral.

## 2021-11-03 NOTE — Assessment & Plan Note (Signed)
Discussed heat/ice. She will look into getting a TENS unit, explained how to use and its purpose Advised Celebrex 100 mg BID x 10 days for pain relief and Flexeril 5 mg up to TID x 10 days. I advised that flexeril can cause drowsiness, she will try nightly at first.  She will mention this episode to her neurologist, they discussed potential referral to PT.

## 2021-11-03 NOTE — Telephone Encounter (Signed)
Advised patient of results/hd  

## 2021-11-07 ENCOUNTER — Other Ambulatory Visit: Payer: Self-pay | Admitting: Physician Assistant

## 2021-11-07 DIAGNOSIS — F321 Major depressive disorder, single episode, moderate: Secondary | ICD-10-CM

## 2021-11-07 DIAGNOSIS — F411 Generalized anxiety disorder: Secondary | ICD-10-CM

## 2021-11-15 ENCOUNTER — Encounter: Payer: Self-pay | Admitting: Physician Assistant

## 2021-11-21 ENCOUNTER — Other Ambulatory Visit: Payer: Self-pay | Admitting: Physician Assistant

## 2021-11-21 DIAGNOSIS — F321 Major depressive disorder, single episode, moderate: Secondary | ICD-10-CM

## 2021-11-23 ENCOUNTER — Other Ambulatory Visit: Payer: Self-pay | Admitting: Licensed Clinical Social Worker

## 2021-11-23 DIAGNOSIS — F411 Generalized anxiety disorder: Secondary | ICD-10-CM

## 2021-11-23 DIAGNOSIS — F321 Major depressive disorder, single episode, moderate: Secondary | ICD-10-CM

## 2021-11-23 NOTE — Patient Outreach (Addendum)
Medicaid Managed Care Social Work Note  11/23/2021 Name:  Amanda [name] MRN:  [id number] DOB:  1996/04/23  Amanda [name] is an 25 y.o. year old female who is a primary patient of Amanda [name], Amanda [name], [place].  The Medicaid Managed Care Coordination team was consulted for assistance with:  Tea and Resources  Amanda [name] was given information about Medicaid Managed Care Coordination team services today. Amanda [name] Patient agreed to services and verbal consent obtained.  Engaged with patient  for by telephone forinitial visit in response to referral for case management and/or care coordination services.   SDOH Screenings   Alcohol Screen: Low Risk    Last Alcohol Screening Score (AUDIT): 0  Depression (PHQ2-9): Medium Risk   PHQ-2 Score: 17  Financial Resource Strain: Not on file  Food Insecurity: Not on file  Housing: Not on file  Physical Activity: Not on file  Social Connections: Not on file  Stress: Stress Concern Present   Feeling of Stress : Very much  Tobacco Use: Low Risk    Smoking Tobacco Use: Never   Smokeless Tobacco Use: Never   Passive Exposure: Not on file  Transportation Needs: Not on file   Assessments/Interventions:  Review of past medical history, allergies, medications, health status, including review of consultants reports, laboratory and other test data, was performed as part of comprehensive evaluation and provision of chronic care management services.  SDOH: (Social Determinant of Health) assessments and interventions performed:   Advanced Directives Status:  See Care Plan for related entries.  Care Plan                 Allergies  Allergen Reactions   Imitrex [Sumatriptan] Swelling    Facial swelling and burning lips   Penicillins Hives    Did it involve swelling of the face/tongue/throat, SOB, or low BP? No Did it involve sudden or severe rash/hives, skin peeling, or any reaction on the inside of your mouth or nose? Yes Did you need to seek  medical attention at a hospital or doctor's office? Yes When did it last happen?      childhood allergy If all above answers are "NO", may proceed with cephalosporin use.    Medications Reviewed Today     Reviewed by Emelia Loron (Physician Assistant Certified) on 44/81/85 at 1647  Med List Status: <None>   Medication Order Taking? Sig Documenting Provider Last Dose Status Informant  Atogepant (QULIPTA) 60 MG TABS [id number] Yes Take 60 mg by mouth daily. Pieter Partridge, DO Taking Active   celecoxib (CELEBREX) 100 MG capsule [id number] Yes Take 1 capsule (100 mg total) by mouth 2 (two) times daily. [name], PA-C  Active   cetirizine (ZYRTEC) 10 MG tablet [id number] Yes TAKE 1 TABLET BY MOUTH EVERY DAY [name], [name], FNP Taking Active   cyclobenzaprine (FLEXERIL) 5 MG tablet [id number] Yes Take 1 tablet (5 mg total) by mouth 3 (three) times daily as needed for muscle spasms. [name], PA-C  Active    Patient not taking:   Discontinued 01/12/21 1011    Patient not taking:   Discontinued 01/12/21 1011    Patient not taking:   Discontinued 01/12/21 1011   oxybutynin (DITROPAN-XL) 5 MG 24 hr tablet [id number] Yes Take 1 tablet (5 mg total) by mouth at bedtime. Gae Dry, MD Taking Active   Rimegepant Sulfate (NURTEC) 75 MG TBDP [id number] Yes Take 75 mg by mouth daily as needed (maximum 1 tablet in 24 hours.). Metta Clines  R, DO Taking Active   valACYclovir (VALTREX) 1000 MG tablet [id number] Yes Take 1 tablet (1,000 mg total) by mouth daily. Copland, [name] Evener, PA-C Taking Active   venlafaxine XR (EFFEXOR XR) 37.5 MG 24 hr capsule [id number] Yes Take 1 pill daily for 1 week and then can double and take 75 mg daily [name], PA-C  Active             Patient Active Problem List   Diagnosis Date Noted   Neck muscle spasm 11/03/2021   Depression, major, single episode, moderate (HCC) 11/03/2021   GAD (generalized anxiety disorder) 11/03/2021   History of  affective disorder 11/03/2021   CIN I (cervical intraepithelial neoplasia I) 02/14/2021   Genital herpes simplex type 2 02/10/2021   Vitamin D deficiency 06/28/2020   History of chronic sinusitis 06/28/2020   Monochorionic diamniotic twin gestation 05/20/2019   Chronic migraine without aura 02/11/2017   Vision loss of left eye 01/17/2017   Acne 11/18/2014    Conditions to be addressed/monitored per PCP order:  Depression  imeframe:  Long-Range Goal Priority:  High Start Date:   11/23/21                      Expected End Date:  ongoing                     Follow Up Date--11/30/21   - check out counseling - keep 21 percent of counseling appointments - schedule counseling appointment    Why is this important?   Beating depression may take some time.  If you don't feel better right away, don't give up on your treatment plan.    Current barriers:   Chronic Mental Health needs related to depression and bi polar 1 Mental Health Concerns  Needs Support, Education, and Care Coordination in order to meet unmet mental health needs. Clinical Goal(s): demonstrate a reduction in symptoms related to :Stress, Depression, connect with provider for ongoing mental health treatment.  , and increase coping skills, healthy habits, self-management skills, and stress reduction     Clinical Interventions:  Assessed patient's previous and current treatment, coping skills, support system and barriers to care  Depression screen reviewed  Solution-Focused Strategies Mindfulness or Relaxation Training Active listening / Reflection utilized  Emotional Supportive Provided Behavioral Activation Participation in counseling encouraged  Verbalization of feelings encouraged  Crisis Resource Education / information provided  Suicidal Ideation/Homicidal Ideation assessed: [place] [employer]  Discussed referral for psychiatry  ; Review various resources, discussed options and provided  patient information about  Options for mental health treatment based on need and insurance   Inter-disciplinary care team collaboration (see longitudinal plan of care) LCSW discussed coping skills for anxiety and depression. SW used empathetic and active and reflective listening, validated patient's feelings/concerns, and provided emotional support. LCSW provided self-care education to help manage her health conditions and improve her mood.  Patient is interested in gaining mental health support at this time. [place] LCSW was educated on [facility].  Patient reports that she has been experiencing some rage that she has not experienced before. [facility] LCSW provided coping skill education for anger management.  Patient denies any current crises or urgent needs Patient Goals/Self-Care Activities: Over the next 120 days Call your insurance provider for more information about your Enhanced Benefits   10 LITTLE Things To Do When You're Feeling Too Down To Do Anything  Take a shower. Even  if you plan to stay in all day long and not see a soul, take a shower. It takes the most effort to hop in to the shower but once you do, you'll feel immediate results. It will wake you up and you'll be feeling much fresher (and cleaner too).  Brush and floss your teeth. Give your teeth a good brushing with a floss finish. It's a small task but it feels so good and you can check 'taking care of your health' off the list of things to do.  Do something small on your list. Most of [place] have some small thing we would like to get done (load of laundry, sew a button, email a friend). Doing one of these things will make you feel like you've accomplished something.  Drink water. Drinking water is easy right? It's also really beneficial for your health so keep a glass beside you all day and take sips often. It gives you energy and prevents you from boredom eating.  Do some floor exercises. The last thing you want to do  is exercise but it might be just the thing you need the most. Keep it simple and do exercises that involve sitting or laying on the floor. Even the smallest of exercises release chemicals in the brain that make you feel good. Yoga stretches or core exercises are going to make you feel good with minimal effort.  Make your bed. Making your bed takes a few minutes but it's productive and you'll feel relieved when it's done. An unmade bed is a huge visual reminder that you're having an unproductive day. Do it and consider it your housework for the day.  Put on some nice clothes. Take the sweatpants off even if you don't plan to go anywhere. Put on clothes that make you feel good. Take a look in the mirror so your brain recognizes the sweatpants have been replaced with clothes that make you look great. It's an instant confidence booster.  Wash the dishes. A pile of dirty dishes in the sink is a reflection of your mood. It's possible that if you wash up the dishes, your mood will follow suit. It's worth a try.  Cook a real meal. If you have the luxury to have a "do nothing" day, you have time to make a real meal for yourself. Make a meal that you love to eat. The process is good to get you out of the funk and the food will ensure you have more energy for tomorrow.  Write out your thoughts by hand. When you hand write, you stimulate your brain to focus on the moment that you're in so make yourself comfortable and write whatever comes into your mind. Put those thoughts out on paper so they stop spinning around in your head. Those thoughts might be the very thing holding you down.  Follow up:  Patient agrees to Care Plan and Follow-up.  Plan: The Managed Medicaid care management team will reach out to the patient again over the next 30 days.  Date of next scheduled Social Work care management/care coordination outreach:  11/30/21.   [name], BSW, MSW, [employer] Managed Medicaid LCSW [email or web address] Phone: [phone]

## 2021-11-24 NOTE — Patient Instructions (Signed)
Visit Information  Amanda [name] was given information about Medicaid Managed Care team care coordination services as a part of their [facility] Medicaid benefit. Amanda [name] verbally consented to engagement with the [facility] Managed Care team.   If you are experiencing a medical emergency, please call 911 or report to your local emergency department or urgent care.   If you have a non-emergency medical problem during routine business hours, please contact your provider's office and ask to speak with a nurse.   For questions related to your [facility] health plan, please call: [phone] or go here:[email or web address]  If you would like to schedule transportation through your [facility] plan, please call the following number at least 2 days in advance of your appointment: [phone].  Call the [place] at [phone], at any time, 24 hours a day, 7 days a week. If you are in danger or need immediate medical attention call 911.  If you would like help to quit smoking, call 1-800-QUIT-NOW 212-040-3097) OR Espaol: 1-855-Djelo-Ya (6-256-389-3734) o para ms informacin haga clic aqu or Text READY to 200-400 to register via text  Following is a copy of your plan of care:  Care Plan : General Social Work (Adult)  Updates made by Amanda Cutter, LCSW since 11/24/2021 12:00 AM     Problem: Depression Identification (Depression)      Long-Range Goal: Depressive Symptoms Identified   Start Date: 11/23/2021  This Visit's Progress: On track  Priority: High  Note:   Timeframe:  Long-Range Goal Priority:  High Start Date:   11/23/21                      Expected End Date:  ongoing                     Follow Up Date--11/30/21   - check out counseling - keep 90 percent of counseling appointments - schedule counseling appointment    Why is this important?   Beating depression may take some time.  If you don't feel better right away, don't give  up on your treatment plan.    Current barriers:   Chronic Mental Health needs related to depression and bi polar 1 Mental Health Concerns  Needs Support, Education, and Care Coordination in order to meet unmet mental health needs. Clinical Goal(s): demonstrate a reduction in symptoms related to :Stress, Depression, connect with provider for ongoing mental health treatment.  , and increase coping skills, healthy habits, self-management skills, and stress reduction     Patient Goals/Self-Care Activities: Over the next 120 days Call your insurance provider for more information about your Enhanced Benefits   10 LITTLE Things To Do When You're Feeling Too Down To Do Anything  Take a shower. Even if you plan to stay in all day long and not see a soul, take a shower. It takes the most effort to hop in to the shower but once you do, you'll feel immediate results. It will wake you up and you'll be feeling much fresher (and cleaner too).  Brush and floss your teeth. Give your teeth a good brushing with a floss finish. It's a small task but it feels so good and you can check 'taking care of your health' off the list of things to do.  Do something small on your list. Most of [place] have some small thing we would like to get done (load of laundry, sew a button, email a friend). Doing one  of these things will make you feel like you've accomplished something.  Drink water. Drinking water is easy right? It's also really beneficial for your health so keep a glass beside you all day and take sips often. It gives you energy and prevents you from boredom eating.  Do some floor exercises. The last thing you want to do is exercise but it might be just the thing you need the most. Keep it simple and do exercises that involve sitting or laying on the floor. Even the smallest of exercises release chemicals in the brain that make you feel good. Yoga stretches or core exercises are going to make you feel good with minimal  effort.  Make your bed. Making your bed takes a few minutes but it's productive and you'll feel relieved when it's done. An unmade bed is a huge visual reminder that you're having an unproductive day. Do it and consider it your housework for the day.  Put on some nice clothes. Take the sweatpants off even if you don't plan to go anywhere. Put on clothes that make you feel good. Take a look in the mirror so your brain recognizes the sweatpants have been replaced with clothes that make you look great. It's an instant confidence booster.  Wash the dishes. A pile of dirty dishes in the sink is a reflection of your mood. It's possible that if you wash up the dishes, your mood will follow suit. It's worth a try.  Cook a real meal. If you have the luxury to have a "do nothing" day, you have time to make a real meal for yourself. Make a meal that you love to eat. The process is good to get you out of the funk and the food will ensure you have more energy for tomorrow.  Write out your thoughts by hand. When you hand write, you stimulate your brain to focus on the moment that you're in so make yourself comfortable and write whatever comes into your mind. Put those thoughts out on paper so they stop spinning around in your head. Those thoughts might be the very thing holding you down.  [name], BSW, MSW, [employer] Managed Medicaid LCSW [name] Phone: [phone]

## 2021-11-26 ENCOUNTER — Other Ambulatory Visit: Payer: Self-pay | Admitting: Physician Assistant

## 2021-11-26 DIAGNOSIS — F411 Generalized anxiety disorder: Secondary | ICD-10-CM

## 2021-11-26 DIAGNOSIS — F321 Major depressive disorder, single episode, moderate: Secondary | ICD-10-CM

## 2021-11-28 ENCOUNTER — Other Ambulatory Visit: Payer: Self-pay | Admitting: Physician Assistant

## 2021-11-28 DIAGNOSIS — F411 Generalized anxiety disorder: Secondary | ICD-10-CM

## 2021-11-28 MED ORDER — VENLAFAXINE HCL ER 75 MG PO CP24: 75.0000 mg | ORAL_CAPSULE | Freq: Every day | ORAL | 1 refills | Status: DC

## 2021-11-28 NOTE — Telephone Encounter (Signed)
Sent in 75 mg maint. dose

## 2021-11-30 ENCOUNTER — Encounter: Payer: Self-pay | Admitting: Physician Assistant

## 2021-11-30 ENCOUNTER — Other Ambulatory Visit: Payer: Self-pay | Admitting: Licensed Clinical Social Worker

## 2021-11-30 NOTE — Addendum Note (Signed)
Addended by: Greg Cutter on: 11/30/2021 01:43 PM   Modules accepted: Orders

## 2021-12-01 NOTE — Patient Outreach (Signed)
Medicaid Managed Care Social Work Note  11/30/21 Name:  Amanda [name] MRN:  [id number] DOB:  Oct 15, 1996  Amanda [name] is an 25 y.o. year old female who is a primary patient of Thedore Mins, Ria Comment, [place].  The Medicaid Managed Care Coordination team was consulted for assistance with:  [place] and Resources  [name] was given information about Medicaid Managed Care Coordination team services today. Amanda [name] Patient agreed to services and verbal consent obtained.  Engaged with patient  for by telephone forfollow up visit in response to referral for case management and/or care coordination services.   Assessments/Interventions:  Review of past medical history, allergies, medications, health status, including review of consultants reports, laboratory and other test data, was performed as part of comprehensive evaluation and provision of chronic care management services.  SDOH: (Social Determinant of Health) assessments and interventions performed: SDOH Interventions    Flowsheet Row Most Recent Value  SDOH Interventions   Stress Interventions Offered [employer], Provide Counseling       Advanced Directives Status:  See Care Plan for related entries.  Care Plan                 Allergies  Allergen Reactions   Imitrex [Sumatriptan] Swelling    Facial swelling and burning lips   Penicillins Hives    Did it involve swelling of the face/tongue/throat, SOB, or low BP? No Did it involve sudden or severe rash/hives, skin peeling, or any reaction on the inside of your mouth or nose? Yes Did you need to seek medical attention at a hospital or doctor's office? Yes When did it last happen?      childhood allergy If all above answers are NO, may proceed with cephalosporin use.    Medications Reviewed Today     Reviewed by Emelia Loron (Physician Assistant Certified) on 81/27/51 at 1647  Med List Status: <None>   Medication Order Taking? Sig  Documenting Provider Last Dose Status Informant  Atogepant (QULIPTA) 60 MG TABS [id number] Yes Take 60 mg by mouth daily. Pieter Partridge, DO Taking Active   celecoxib (CELEBREX) 100 MG capsule [id number] Yes Take 1 capsule (100 mg total) by mouth 2 (two) times daily. [name], PA-C  Active   cetirizine (ZYRTEC) 10 MG tablet [id number] Yes TAKE 1 TABLET BY MOUTH EVERY DAY [name], [name], FNP Taking Active   cyclobenzaprine (FLEXERIL) 5 MG tablet [id number] Yes Take 1 tablet (5 mg total) by mouth 3 (three) times daily as needed for muscle spasms. [name], PA-C  Active    Patient not taking:   Discontinued 01/12/21 1011    Patient not taking:   Discontinued 01/12/21 1011    Patient not taking:   Discontinued 01/12/21 1011   oxybutynin (DITROPAN-XL) 5 MG 24 hr tablet [id number] Yes Take 1 tablet (5 mg total) by mouth at bedtime. Gae Dry, MD Taking Active   Rimegepant Sulfate (NURTEC) 75 MG TBDP [id number] Yes Take 75 mg by mouth daily as needed (maximum 1 tablet in 24 hours.). Pieter Partridge, DO Taking Active   valACYclovir (VALTREX) 1000 MG tablet [id number] Yes Take 1 tablet (1,000 mg total) by mouth daily. Copland, [name] Evener, PA-C Taking Active   venlafaxine XR (EFFEXOR XR) 37.5 MG 24 hr capsule [id number] Yes Take 1 pill daily for 1 week and then can double and take 75 mg daily [name], PA-C  Active             Patient  Active Problem List   Diagnosis Date Noted   Neck muscle spasm 11/03/2021   Depression, major, single episode, moderate ([place]) 11/03/2021   GAD (generalized anxiety disorder) 11/03/2021   History of affective disorder 11/03/2021   CIN I (cervical intraepithelial neoplasia I) 02/14/2021   Genital herpes simplex type 2 02/10/2021   Vitamin D deficiency 06/28/2020   History of chronic sinusitis 06/28/2020   Monochorionic diamniotic twin gestation 05/20/2019   Chronic migraine without aura 02/11/2017   Vision loss of left eye 01/17/2017   Acne  11/18/2014    Conditions to be addressed/monitored per PCP order:  Anxiety and Depression  Care Plan : General Social Work (Adult)  Updates made by Greg Cutter, LCSW since 12/01/2021 12:00 AM     Problem: Depression Identification (Depression)      Long-Range Goal: Depressive Symptoms Identified   Start Date: 11/23/2021  Recent Progress: On track  Priority: High  Note:   Timeframe:  Long-Range Goal Priority:  High Start Date:   11/23/21                      Expected End Date:  ongoing                     Follow Up Date--12/16/21   - check out counseling - keep 90 percent of counseling appointments - schedule counseling appointment    Why is this important?   Beating depression may take some time.  If you don't feel better right away, don't give up on your treatment plan.    Current barriers:   Chronic Mental Health needs related to depression and bi polar 1 Mental Health Concerns  Needs Support, Education, and Care Coordination in order to meet unmet mental health needs. Clinical Goal(s): demonstrate a reduction in symptoms related to :Stress, Depression, connect with provider for ongoing mental health treatment.  , and increase coping skills, healthy habits, self-management skills, and stress reduction     Clinical Interventions:  Assessed patient's previous and current treatment, coping skills, support system and barriers to care  Depression screen reviewed  Solution-Focused Strategies Mindfulness or Relaxation Training Active listening / Reflection utilized  Emotional Supportive Provided Behavioral Activation Participation in counseling encouraged  Verbalization of feelings encouraged  Crisis Resource Education / information provided  Suicidal Ideation/Homicidal Ideation assessed: [place] of [employer]  Discussed referral for psychiatry  ; Review various resources, discussed options and provided patient information about  Options for mental  health treatment based on need and insurance   Inter-disciplinary care team collaboration (see longitudinal plan of care) LCSW discussed coping skills for anxiety and depression. SW used empathetic and active and reflective listening, validated patient's feelings/concerns, and provided emotional support. LCSW provided self-care education to help manage her health conditions and improve her mood.  Patient is interested in gaining mental health support at this time. [facility] LCSW was educated on [facility] and is agreeable to [facility] referral for medication management and counseling. Referral placed on 12/01/21 for both services. Patient is agreeable to contact [facility] herself to inquire about services as well. Patient is aware that she may have to consider another mental health provider if they decline her referral as she does not live in their county. However patient already has several appointments at [facility] and is hopeful that they will consider her for treatment. Patient will advocate for herself when she contacts [facility] today. Patient reports that she has been  experiencing some rage that she has not experienced before. [facility] LCSW provided coping skill education for anger management.  Patient denies any current crises or urgent needs Patient Goals/Self-Care Activities: Over the next 120 days Call your insurance provider for more information about your Enhanced Benefits   10 LITTLE Things To Do When Youre Feeling Too Down To Do Anything  Take a shower. Even if you plan to stay in all day long and not see a soul, take a shower. It takes the most effort to hop in to the shower but once you do, youll feel immediate results. It will wake you up and youll be feeling much fresher (and cleaner too).  Brush and floss your teeth. Give your teeth a good brushing with a floss finish. Its a small task but it feels so good and you can check taking care of your health off the list of things to  do.  Do something small on your list. Most of [place] have some small thing we would like to get done (load of laundry, sew a button, email a friend). Doing one of these things will make you feel like youve accomplished something.  Drink water. Drinking water is easy right? Its also really beneficial for your health so keep a glass beside you all day and take sips often. It gives you energy and prevents you from boredom eating.  Do some floor exercises. The last thing you want to do is exercise but it might be just the thing you need the most. Keep it simple and do exercises that involve sitting or laying on the floor. Even the smallest of exercises release chemicals in the brain that make you feel good. Yoga stretches or core exercises are going to make you feel good with minimal effort.  Make your bed. Making your bed takes a few minutes but its productive and youll feel relieved when its done. An unmade bed is a huge visual reminder that youre having an unproductive day. Do it and consider it your housework for the day.  Put on some nice clothes. Take the sweatpants off even if you dont plan to go anywhere. Put on clothes that make you feel good. Take a look in the mirror so your brain recognizes the sweatpants have been replaced with clothes that make you look great. Its an instant confidence booster.  Wash the dishes. A pile of dirty dishes in the sink is a reflection of your mood. Its possible that if you wash up the dishes, your mood will follow suit. Its worth a try.  Cook a real meal. If you have the luxury to have a do nothing day, you have time to make a real meal for yourself. Make a meal that you love to eat. The process is good to get you out of the funk and the food will ensure you have more energy for tomorrow.  Write out your thoughts by hand. When you hand write, you stimulate your brain to focus on the moment that youre in so make yourself comfortable and write  whatever comes into your mind. Put those thoughts out on paper so they stop spinning around in your head. Those thoughts might be the very thing holding you down.      Follow up:  Patient agrees to Care Plan and Follow-up.  Plan: The Managed Medicaid care management team will reach out to the patient again over the next 30 days.  Date/time of next scheduled Social Work care management/care coordination  outreach:  12/16/21 at 10:30 am  [name], BSW, MSW, [facility] Managed Medicaid LCSW [email or web address] Phone: [phone]

## 2021-12-01 NOTE — Patient Instructions (Signed)
Visit Information  [name] was given information about Medicaid Managed Care team care coordination services as a part of their [facility] Medicaid benefit. Chassidy Layson verbally consented to engagement with the [facility] Managed Care team.   If you are experiencing a medical emergency, please call 911 or report to your local emergency department or urgent care.   If you have a non-emergency medical problem during routine business hours, please contact your provider's office and ask to speak with a nurse.   For questions related to your [facility] health plan, please call: [phone] or go here:[email or web address]  If you would like to schedule transportation through your [facility] plan, please call the following number at least 2 days in advance of your appointment: [phone].  Call the [place] at [phone], at any time, 24 hours a day, 7 days a week. If you are in danger or need immediate medical attention call 911.  If you would like help to quit smoking, call 1-800-QUIT-NOW 385-799-1247) OR Espaol: 1-855-Djelo-Ya (9-798-921-1941) o para ms informacin haga clic aqu or Text READY to 200-400 to register via text   Following is a copy of your plan of care:  Care Plan : General Social Work (Adult)  Updates made by Greg Cutter, LCSW since 12/01/2021 12:00 AM     Problem: Depression Identification (Depression)      Long-Range Goal: Depressive Symptoms Identified   Start Date: 11/23/2021  Recent Progress: On track  Priority: High  Note:   Timeframe:  Long-Range Goal Priority:  High Start Date:   11/23/21                      Expected End Date:  ongoing                     Follow Up Date--12/16/21   - check out counseling - keep 90 percent of counseling appointments - schedule counseling appointment    Why is this important?   Beating depression may take some time.  If you don't feel better right away, don't give up  on your treatment plan.    Current barriers:   Chronic Mental Health needs related to depression and bi polar 1 Mental Health Concerns  Needs Support, Education, and Care Coordination in order to meet unmet mental health needs. Clinical Goal(s): demonstrate a reduction in symptoms related to :Stress, Depression, connect with provider for ongoing mental health treatment.  , and increase coping skills, healthy habits, self-management skills, and stress reduction     Patient Goals/Self-Care Activities: Over the next 120 days Call your insurance provider for more information about your Enhanced Benefits   10 LITTLE Things To Do When Youre Feeling Too Down To Do Anything  Take a shower. Even if you plan to stay in all day long and not see a soul, take a shower. It takes the most effort to hop in to the shower but once you do, youll feel immediate results. It will wake you up and youll be feeling much fresher (and cleaner too).  Brush and floss your teeth. Give your teeth a good brushing with a floss finish. Its a small task but it feels so good and you can check taking care of your health off the list of things to do.  Do something small on your list. Most of [place] have some small thing we would like to get done (load of laundry, sew a button, email a friend). Doing one  of these things will make you feel like youve accomplished something.  Drink water. Drinking water is easy right? Its also really beneficial for your health so keep a glass beside you all day and take sips often. It gives you energy and prevents you from boredom eating.  Do some floor exercises. The last thing you want to do is exercise but it might be just the thing you need the most. Keep it simple and do exercises that involve sitting or laying on the floor. Even the smallest of exercises release chemicals in the brain that make you feel good. Yoga stretches or core exercises are going to make you feel good with minimal  effort.  Make your bed. Making your bed takes a few minutes but its productive and youll feel relieved when its done. An unmade bed is a huge visual reminder that youre having an unproductive day. Do it and consider it your housework for the day.  Put on some nice clothes. Take the sweatpants off even if you dont plan to go anywhere. Put on clothes that make you feel good. Take a look in the mirror so your brain recognizes the sweatpants have been replaced with clothes that make you look great. Its an instant confidence booster.  Wash the dishes. A pile of dirty dishes in the sink is a reflection of your mood. Its possible that if you wash up the dishes, your mood will follow suit. Its worth a try.  Cook a real meal. If you have the luxury to have a do nothing day, you have time to make a real meal for yourself. Make a meal that you love to eat. The process is good to get you out of the funk and the food will ensure you have more energy for tomorrow.  Write out your thoughts by hand. When you hand write, you stimulate your brain to focus on the moment that youre in so make yourself comfortable and write whatever comes into your mind. Put those thoughts out on paper so they stop spinning around in your head. Those thoughts might be the very thing holding you down.

## 2021-12-08 ENCOUNTER — Other Ambulatory Visit: Payer: Self-pay | Admitting: Physician Assistant

## 2021-12-08 ENCOUNTER — Encounter: Payer: Self-pay | Admitting: Physician Assistant

## 2021-12-08 DIAGNOSIS — M542 Cervicalgia: Secondary | ICD-10-CM

## 2021-12-08 MED ORDER — CELECOXIB 100 MG PO CAPS: 100.0000 mg | ORAL_CAPSULE | Freq: Two times a day (BID) | ORAL | 0 refills | Status: DC

## 2021-12-08 NOTE — Progress Notes (Deleted)
[place] Phone: [phone] Subjective:    I'm seeing this patient by the request  of:  Amanda [name]   CC: neck pain   [id number]  Amanda [name] is a 25 y.o. female coming in with complaint of neck pain. Referred by Amanda [name]. Patient states    Patient is on Effexor as well as on Celebrex.    Past Medical History:  Diagnosis Date   BRCA negative 09/2020   MyRisk neg except AXIN2 VUS   Family history of adverse reaction to anesthesia    heart condition   Family history of breast cancer 09/2020   IBIS=15.7%/riskscore=14.7%   Family history of pancreatic cancer    GERD (gastroesophageal reflux disease)    Migraine    Past Surgical History:  Procedure Laterality Date   NO PAST SURGERIES     WISDOM TOOTH EXTRACTION     Social History   Socioeconomic History   Marital status: Single    Spouse name: Amanda [name]   Number of children: Not on file   Years of education: Not on file   Highest education level: Not on file  Occupational History   Occupation: Massage Therapy  Tobacco Use   Smoking status: Never   Smokeless tobacco: Never  Vaping Use   Vaping Use: Never used  Substance and Sexual Activity   Alcohol use: Never   Drug use: No   Sexual activity: Yes    Birth control/protection: None  Other Topics Concern   Not on file  Social History Narrative   ** Merged History Encounter **       One story home Right-handed Caffeine: occasional coffee   Social Determinants of Radio broadcast assistant Strain: Not on file  Food Insecurity: Not on file  Transportation Needs: Not on file  Physical Activity: Not on file  Stress: Stress Concern Present   Feeling of Stress : Rather much  Social Connections: Not on file   Allergies  Allergen Reactions   Imitrex [Sumatriptan] Swelling    Facial swelling and burning lips   Penicillins Hives    Did it involve swelling of  the face/tongue/throat, SOB, or low BP? No Did it involve sudden or severe rash/hives, skin peeling, or any reaction on the inside of your mouth or nose? Yes Did you need to seek medical attention at a hospital or doctor's office? Yes When did it last happen?      childhood allergy If all above answers are NO, may proceed with cephalosporin use.   Family History  Problem Relation Age of Onset   Migraines Mother    Skin cancer Father    Asthma Maternal Grandmother    Hypertension Maternal Grandmother    Thyroid disease Maternal Grandmother    Hypertension Maternal Grandfather    Pancreatic cancer Maternal Grandfather 18   Colon cancer Maternal Grandfather 60   Hypertension Paternal Grandmother    Diabetes Paternal Grandmother    Hypertension Paternal Grandfather    Diabetes Paternal Grandfather    Colon cancer Paternal Grandfather 43   Thyroid disease Maternal Aunt    Breast cancer Maternal Aunt 40   Allergies Brother       Current Outpatient Medications (Respiratory):    cetirizine (ZYRTEC) 10 MG tablet, TAKE 1 TABLET BY MOUTH EVERY DAY  Current Outpatient Medications (Analgesics):    Atogepant (QULIPTA) 60 MG TABS, Take 60 mg by mouth daily.   celecoxib (  CELEBREX) 100 MG capsule, Take 1 capsule (100 mg total) by mouth 2 (two) times daily.   NURTEC 75 MG TBDP, DISSOLVE 1 TABLET ON THE TONGUE EVERY OTHER DAY   Current Outpatient Medications (Other):    cyclobenzaprine (FLEXERIL) 5 MG tablet, Take 1 tablet (5 mg total) by mouth 3 (three) times daily as needed for muscle spasms.   oxybutynin (DITROPAN-XL) 5 MG 24 hr tablet, Take 1 tablet (5 mg total) by mouth at bedtime.   valACYclovir (VALTREX) 1000 MG tablet, Take 1 tablet (1,000 mg total) by mouth daily.   venlafaxine XR (EFFEXOR XR) 75 MG 24 hr capsule, Take 1 capsule (75 mg total) by mouth daily with breakfast.   Reviewed prior external information including notes and imaging from  primary care provider As well as  notes that were available from care everywhere and other healthcare systems.  Past medical history, social, surgical and family history all reviewed in electronic medical record.  No pertanent information unless stated regarding to the chief complaint.   Review of Systems:  No headache, visual changes, nausea, vomiting, diarrhea, constipation, dizziness, abdominal pain, skin rash, fevers, chills, night sweats, weight loss, swollen lymph nodes, body aches, joint swelling, chest pain, shortness of breath, mood changes. POSITIVE muscle aches  Objective  There were no vitals taken for this visit.   General: No apparent distress alert and oriented x3 mood and affect normal, dressed appropriately.  HEENT: Pupils equal, extraocular movements intact  Respiratory: Patient's speak in full sentences and does not appear short of breath  Cardiovascular: No lower extremity edema, non tender, no erythema  Gait normal with good balance and coordination.  MSK: Neck exam shows    Impression and Recommendations:     The above documentation has been reviewed and is accurate and complete Amanda Pulley, [name]

## 2021-12-13 ENCOUNTER — Ambulatory Visit: Payer: Medicaid Other | Admitting: Family Medicine

## 2021-12-14 ENCOUNTER — Ambulatory Visit: Payer: Medicaid Other | Admitting: Neurology

## 2021-12-15 ENCOUNTER — Ambulatory Visit: Payer: Medicaid Other | Admitting: Family Medicine

## 2021-12-15 ENCOUNTER — Other Ambulatory Visit: Payer: Self-pay

## 2021-12-15 DIAGNOSIS — G4486 Cervicogenic headache: Secondary | ICD-10-CM | POA: Diagnosis not present

## 2021-12-15 DIAGNOSIS — M9908 Segmental and somatic dysfunction of rib cage: Secondary | ICD-10-CM | POA: Diagnosis not present

## 2021-12-15 DIAGNOSIS — M9902 Segmental and somatic dysfunction of thoracic region: Secondary | ICD-10-CM | POA: Diagnosis not present

## 2021-12-15 DIAGNOSIS — M9901 Segmental and somatic dysfunction of cervical region: Secondary | ICD-10-CM | POA: Diagnosis not present

## 2021-12-15 NOTE — Progress Notes (Signed)
Zach Dezeray [facility] [place] [address] [place] Phone: [phone] Subjective:   [name], am serving as a scribe for Dr. Hulan Saas. This visit occurred during the SARS-CoV-2 public health emergency.  Safety protocols were in place, including screening questions prior to the visit, additional usage of staff PPE, and extensive cleaning of exam room while observing appropriate contact time as indicated for disinfecting solutions.   I'm seeing this patient by the request  of:  Drubel, Yisroel Ramming DO   CC: neck pain   [id number]  Dajanique Robley is a 25 y.o. female coming in with complaint of neck pain. Referred by Dr. Tomi Likens. Patient states neck pain has been going on for about a year. Progressively worse. Right is worse than left. Achy dull pain constant and sharp with movements. Chronic migraines. Tingling in hands sometimes.   Patient is on Effexor as well as on Celebrex.    Past Medical History:  Diagnosis Date   BRCA negative 09/2020   MyRisk neg except AXIN2 VUS   Family history of adverse reaction to anesthesia    heart condition   Family history of breast cancer 09/2020   IBIS=15.7%/riskscore=14.7%   Family history of pancreatic cancer    GERD (gastroesophageal reflux disease)    Migraine    Past Surgical History:  Procedure Laterality Date   NO PAST SURGERIES     WISDOM TOOTH EXTRACTION     Social History   Socioeconomic History   Marital status: Single    Spouse name: [name]   Number of children: Not on file   Years of education: Not on file   Highest education level: Not on file  Occupational History   Occupation: Massage Therapy  Tobacco Use   Smoking status: Never   Smokeless tobacco: Never  Vaping Use   Vaping Use: Never used  Substance and Sexual Activity   Alcohol use: Never   Drug use: No   Sexual activity: Yes    Birth control/protection: None  Other Topics Concern   Not on file  Social  History Narrative   ** Merged History Encounter **       One story home Right-handed Caffeine: occasional coffee   Social Determinants of Radio broadcast assistant Strain: Not on file  Food Insecurity: Not on file  Transportation Needs: Not on file  Physical Activity: Not on file  Stress: Stress Concern Present   Feeling of Stress : Rather much  Social Connections: Not on file   Allergies  Allergen Reactions   Imitrex [Sumatriptan] Swelling    Facial swelling and burning lips   Penicillins Hives    Did it involve swelling of the face/tongue/throat, SOB, or low BP? No Did it involve sudden or severe rash/hives, skin peeling, or any reaction on the inside of your mouth or nose? Yes Did you need to seek medical attention at a hospital or doctor's office? Yes When did it last happen?      childhood allergy If all above answers are NO, may proceed with cephalosporin use.   Family History  Problem Relation Age of Onset   Migraines Mother    Skin cancer Father    Asthma Maternal Grandmother    Hypertension Maternal Grandmother    Thyroid disease Maternal Grandmother    Hypertension Maternal Grandfather    Pancreatic cancer Maternal Grandfather 25   Colon cancer Maternal Grandfather 60   Hypertension Paternal Grandmother    Diabetes Paternal  Grandmother    Hypertension Paternal Grandfather    Diabetes Paternal Grandfather    Colon cancer Paternal Grandfather 28   Thyroid disease Maternal Aunt    Breast cancer Maternal Aunt 33   Allergies Brother       Current Outpatient Medications (Respiratory):    cetirizine (ZYRTEC) 10 MG tablet, TAKE 1 TABLET BY MOUTH EVERY DAY  Current Outpatient Medications (Analgesics):    Atogepant (QULIPTA) 60 MG TABS, Take 60 mg by mouth daily.   celecoxib (CELEBREX) 100 MG capsule, Take 1 capsule (100 mg total) by mouth 2 (two) times daily.   NURTEC 75 MG TBDP, DISSOLVE 1 TABLET ON THE TONGUE EVERY OTHER DAY   Current Outpatient  Medications (Other):    cyclobenzaprine (FLEXERIL) 5 MG tablet, Take 1 tablet (5 mg total) by mouth 3 (three) times daily as needed for muscle spasms.   oxybutynin (DITROPAN-XL) 5 MG 24 hr tablet, Take 1 tablet (5 mg total) by mouth at bedtime.   valACYclovir (VALTREX) 1000 MG tablet, Take 1 tablet (1,000 mg total) by mouth daily.   venlafaxine XR (EFFEXOR XR) 75 MG 24 hr capsule, Take 1 capsule (75 mg total) by mouth daily with breakfast.   Reviewed prior external information including notes and imaging from  primary care provider As well as notes that were available from care everywhere and other healthcare systems.  Past medical history, social, surgical and family history all reviewed in electronic medical record.  No pertanent information unless stated regarding to the chief complaint.   Review of Systems:  No headache, visual changes, nausea, vomiting, diarrhea, constipation, dizziness, abdominal pain, skin rash, fevers, chills, night sweats, weight loss, swollen lymph nodes, body aches, joint swelling, chest pain, shortness of breath, mood changes. POSITIVE muscle aches  Objective  Blood pressure 104/68, pulse (!) 102, height _0  (1.626 m), weight 143 lb (64.9 kg), SpO2 98 %.   General: No apparent distress alert and oriented x3 mood and affect normal, dressed appropriately.  HEENT: Pupils equal, extraocular movements intact  Respiratory: Patient's speak in full sentences and does not appear short of breath  Cardiovascular: No lower extremity edema, non tender, no erythema  Gait normal with good balance and coordination.  MSK: Neck exam shows patient does have some mild loss of lordosis.  Some tenderness to palpation in the parascapular regions bilaterally right greater than left.  Patient does have significant tightness in the neck lacking the last 10 degrees of flexion.  Osteopathic findings C2 flexed rotated and side bent right C6 flexed rotated and side bent right T3  extended rotated and side bent right inhaled third rib T6 extended rotated and side bent left    Impression and Recommendations:     The above documentation has been reviewed and is accurate and complete [name], DO

## 2021-12-15 NOTE — Patient Instructions (Signed)
Do prescribed exercises at least 3x a week Vit D 2000iu Coop pillow 2 tennis balls and tube sock lay on them base of skull as needed Monitor at eye level See you again in 5-6 weeks

## 2021-12-15 NOTE — Assessment & Plan Note (Signed)
Patient responded extremely well to the osteopathic manipulation today.  We discussed posture and ergonomics, discussed which activities that could be beneficial and which ones could cause aggravations.  Patient is going to make some different changes that I think will be helpful.  Increase activity.  Follow-up again in 6 to 8 weeks

## 2021-12-15 NOTE — Assessment & Plan Note (Signed)
   Decision today to treat with OMT was based on Physical Exam  After verbal consent patient was treated with HVLA, ME, FPR techniques in cervical, thoracic, rib,areas, all areas are chronic   Patient tolerated the procedure well with improvement in symptoms  Patient given exercises, stretches and lifestyle modifications  See medications in patient instructions if given  Patient will follow up in 4-8 weeks 

## 2021-12-16 ENCOUNTER — Ambulatory Visit: Payer: Self-pay

## 2021-12-27 ENCOUNTER — Telehealth: Payer: Self-pay | Admitting: *Deleted

## 2021-12-27 NOTE — Chronic Care Management (AMB) (Signed)
..  12/27/2021 Name: Amanda [name] MRN: [id number] DOB: Apr 12, 1996  Referred by: [name], PA-C Reason for referral : High Risk Managed Medicaid (Outreach to reschedule follow up with SW)   An unsuccessful telephone outreach was attempted today. The patient was referred to the case management team for assistance with care management and care coordination.    Follow Up Plan: A HIPAA compliant phone message was left for the patient providing contact information and requesting a return call. and The Managed Medicaid care management team will reach out to the patient again over the next 7 days.    [facility]  Direct Dial: [phone]

## 2021-12-29 ENCOUNTER — Telehealth: Payer: Self-pay | Admitting: *Deleted

## 2021-12-29 NOTE — Chronic Care Management (AMB) (Signed)
12/29/2021 Name: Amanda [name] MRN: [id number] DOB: February 08, 1996  Referred by: [name], PA-C Reason for referral : High Risk Managed Medicaid (Outreach to reschedule follow up with Licensed Clinical SW)  A telephone outreach attempt was made today. Spoke with patient at this time she does not need to reschedule call with Licensed clinical Social worker due to having made a connection with [facility] and scheduling an initial appointment with [name] on 02/16/22    Follow Up Plan: The  Patient has been provided with contact information for the Managed Medicaid care management team and has been advised to call with any health related questions or concerns.    [facility]  Direct Dial: [phone]

## 2022-01-16 NOTE — Progress Notes (Signed)
°Zach Smith D.O. ° Sports Medicine °[address] [place] [address] °Phone: [phone] °Subjective:   °I, Amanda [name], am serving as a scribe for Dr. Zachary Smith. °This visit occurred during the SARS-CoV-2 public health emergency.  Safety protocols were in place, including screening questions prior to the visit, additional usage of staff PPE, and extensive cleaning of exam room while observing appropriate contact time as indicated for disinfecting solutions.  °I'm seeing this patient by the request  of:  Drubel, Lindsay, PA-C ° °CC: Neck and back pain follow-up ° °HPI:Subjective  °Amanda [name] is a 26 y.o. female coming in with complaint of back and neck pain. OMT on 12/15/2021. Patient states that she has been doing well since last visit patient recently did start to get a headache so.  Patient states that it is a dull, throbbing aching pain.  Very similar with her other migraine ° °Medications patient has been prescribed: None ° ° ° °  ° ° ° ° °Reviewed prior external information including notes and imaging from previsou exam, outside providers and external EMR if available.  ° °As well as notes that were available from care everywhere and other healthcare systems. ° °Past medical history, social, surgical and family history all reviewed in electronic medical record.  No pertanent information unless stated regarding to the chief complaint.  ° °Past Medical History:  °Diagnosis Date  ° BRCA negative 09/2020  ° MyRisk neg except AXIN2 VUS  ° Family history of adverse reaction to anesthesia   ° heart condition  ° Family history of breast cancer 09/2020  ° IBIS=15.7%/riskscore=14.7%  ° Family history of pancreatic cancer   ° GERD (gastroesophageal reflux disease)   ° Migraine   °  °Allergies  °Allergen Reactions  ° Imitrex [Sumatriptan] Swelling  °  Facial swelling and burning lips  ° Penicillins Hives  °  Did it involve swelling of the face/tongue/throat, SOB, or low BP? No °Did it involve sudden  or severe rash/hives, skin peeling, or any reaction on the inside of your mouth or nose? Yes °Did you need to seek medical attention at a hospital or doctor's office? Yes °When did it last happen?      childhood allergy °If all above answers are “NO”, may proceed with cephalosporin use.  ° ° ° °Review of Systems: ° No  visual changes, nausea, vomiting, diarrhea, constipation, dizziness, abdominal pain, skin rash, fevers, chills, night sweats, weight loss, swollen lymph nodes, body aches, joint swelling, chest pain, shortness of breath, mood changes. POSITIVE muscle aches, headache ° °Objective  °Blood pressure 110/82, pulse (!) 106, height 5' 4" (1.626 m), weight 148 lb (67.1 kg), SpO2 97 %. °  °General: No apparent distress alert and oriented x3 mood and affect normal, dressed appropriately.  °HEENT: Pupils equal, extraocular movements intact  °Respiratory: Patient's speak in full sentences and does not appear short of breath  °Cardiovascular: No lower extremity edema, non tender, no erythema  °Neck exam does have some mild increase in tightness.  Patient does have tenderness to palpation in the parascapular region as well.  More tightness around patient's sacroiliac joint than on last exam as well.  Tightness with FABER test. ° °Osteopathic findings ° °C2 flexed rotated and side bent right °C7 flexed rotated and side bent left °T3 extended rotated and side bent right inhaled rib °T9 extended rotated and side bent left °L2 flexed rotated and side bent right °Sacrum right on right ° ° ° ° °  °Assessment and   and Plan:  Cervicogenic headache Chronic, with mild exacerbation.  Patient noted did respond extremely well though to osteopathic manipulation.  Discussed which activities to doing which wants to avoid.  Patient will continue work on the posture and ergonomics when possible.  Did have some tightness noted of the lower back today as well.  Follow-up again in 6 to 8 weeks   Nonallopathic problems  Decision today  to treat with OMT was based on Physical Exam  After verbal consent patient was treated with HVLA, ME, FPR techniques in cervical, rib, thoracic, lumbar, and sacral  areas  Patient tolerated the procedure well with improvement in symptoms  Patient given exercises, stretches and lifestyle modifications  See medications in patient instructions if given  Patient will follow up in 4-8 weeks      The above documentation has been reviewed and is accurate and complete Amanda Pulley, DO        Note: This dictation was prepared with Dragon dictation along with smaller phrase technology. Any transcriptional errors that result from this process are unintentional.

## 2022-01-17 ENCOUNTER — Ambulatory Visit (INDEPENDENT_AMBULATORY_CARE_PROVIDER_SITE_OTHER): Payer: Medicaid Other | Admitting: Family Medicine

## 2022-01-17 ENCOUNTER — Other Ambulatory Visit: Payer: Self-pay

## 2022-01-17 ENCOUNTER — Encounter: Payer: Self-pay | Admitting: Family Medicine

## 2022-01-17 VITALS — BP 110/82 | HR 106 | Ht 64.0 in | Wt 148.0 lb

## 2022-01-17 DIAGNOSIS — M9901 Segmental and somatic dysfunction of cervical region: Secondary | ICD-10-CM | POA: Diagnosis not present

## 2022-01-17 DIAGNOSIS — G4486 Cervicogenic headache: Secondary | ICD-10-CM

## 2022-01-17 DIAGNOSIS — M9903 Segmental and somatic dysfunction of lumbar region: Secondary | ICD-10-CM | POA: Diagnosis not present

## 2022-01-17 DIAGNOSIS — M9908 Segmental and somatic dysfunction of rib cage: Secondary | ICD-10-CM | POA: Diagnosis not present

## 2022-01-17 DIAGNOSIS — M9902 Segmental and somatic dysfunction of thoracic region: Secondary | ICD-10-CM

## 2022-01-17 DIAGNOSIS — M9904 Segmental and somatic dysfunction of sacral region: Secondary | ICD-10-CM

## 2022-01-17 NOTE — Assessment & Plan Note (Signed)
Chronic, with mild exacerbation.  Patient noted did respond extremely well though to osteopathic manipulation.  Discussed which activities to doing which wants to avoid.  Patient will continue work on the posture and ergonomics when possible.  Did have some tightness noted of the lower back today as well.  Follow-up again in 6 to 8 weeks

## 2022-01-17 NOTE — Patient Instructions (Signed)
Hip abductor strengthening 2 tennis balls in a tube sock Lay on them where head meets neck See me in 4-6 weeks

## 2022-01-19 ENCOUNTER — Ambulatory Visit: Payer: Medicaid Other | Admitting: Family Medicine

## 2022-01-25 ENCOUNTER — Encounter: Payer: Self-pay | Admitting: Physician Assistant

## 2022-01-27 ENCOUNTER — Ambulatory Visit: Payer: Self-pay | Admitting: *Deleted

## 2022-01-27 NOTE — Telephone Encounter (Signed)
°  Chief Complaint: dizzy Symptoms: dizzy, spinning sensation, hot flashes, weakness Frequency: 2 weeks- 3-5 times /day Pertinent Negatives: Patient denies fever, chest pain, vomiting, diarrhea, bleeding Disposition: [] ED /[] Urgent Care (no appt availability in office) / [x] Appointment(In office/virtual)/ []  [facility]/ [] Home Care/ [] Refused Recommended Disposition /[]  Mobile Bus/ []  Follow-up with PCP Additional Notes: Patient has been scheduled outside of disposition- she has children and has to arrange child care- patient advised increase water, careful with quick movement- orevent falls, call back if gets worse- seek treatment with fainting

## 2022-01-27 NOTE — Telephone Encounter (Signed)
Reason for Disposition  [1] MODERATE dizziness (e.g., interferes with normal activities) AND [2] has NOT been evaluated by physician for this  (Exception: dizziness caused by heat exposure, sudden standing, or poor fluid intake)  Answer Assessment - Initial Assessment Questions 1. DESCRIPTION: "Describe your dizziness."     Dizzy spells- feels she is going to pass out, hot flashes,weakness 2. LIGHTHEADED: "Do you feel lightheaded?" (e.g., somewhat faint, woozy, weak upon standing)     Headaches worse, actually feels faint 3. VERTIGO: "Do you feel like either you or the room is spinning or tilting?" (i.e. vertigo)     yes 4. SEVERITY: "How bad is it?"  "Do you feel like you are going to faint?" "Can you stand and walk?"   - MILD: Feels slightly dizzy, but walking normally.   - MODERATE: Feels unsteady when walking, but not falling; interferes with normal activities (e.g., school, work).   - SEVERE: Unable to walk without falling, or requires assistance to walk without falling; feels like passing out now.      3-5 times/day 5. ONSET:  "When did the dizziness begin?"     2 weeks 6. AGGRAVATING FACTORS: "Does anything make it worse?" (e.g., standing, change in head position)     No- moving head one direction to another 7. HEART RATE: "Can you tell me your heart rate?" "How many beats in 15 seconds?"  (Note: not all patients can do this)       Not noticed 8. CAUSE: "What do you think is causing the dizziness?"     unsure 9. RECURRENT SYMPTOM: "Have you had dizziness before?" If Yes, ask: "When was the last time?" "What happened that time?"     Never- get worse with migraine 10. OTHER SYMPTOMS: "Do you have any other symptoms?" (e.g., fever, chest pain, vomiting, diarrhea, bleeding)       diarrhea 11. PREGNANCY: "Is there any chance you are pregnant?" "When was your last menstrual period?"       No- LMP- 2/5  Protocols used: Dizziness - Lightheadedness-A-AH

## 2022-01-30 ENCOUNTER — Other Ambulatory Visit: Payer: Self-pay | Admitting: Neurology

## 2022-01-30 NOTE — Progress Notes (Signed)
I,Amanda [name],acting as a Education administrator for [employer], PA-C.,have documented all relevant documentation on the behalf of Amanda Kirschner, PA-C,as directed by  Amanda Kirschner, PA-C while in the presence of Amanda Kirschner, PA-C.   Established patient visit   Patient: Amanda [name]   DOB: 1996-02-08   26 y.o. Female  MRN: [id number] Visit Date: 01/31/2022  Today's healthcare provider: [name], PA-C   Cc. Migraine x 3 weeks, depression  Subjective    HPI  Amanda [name] is a 26 y/o female who presents today with a headache at the front of her head for the last three weeks, she states she goes to sleep with it and wakes up with it. She reports associated lightheadedness, nausea, dizziness. Reports occasional blurred vision in her left eye, but this is very typical for her usual migraine. Describes pain and overall unwell sensation as a 8.5/10. She has been taking Qulipta for prevention and Nurtec for abortion. Denies syncope, vomiting, loss of vision, double vision, changes in speech, numbness, paresthesias, weakness.   She also reports worsening symptoms of depression, hopelessness, transient SI. This has been very upsetting to her, unable to sleep at night secondary to headache and depressive symptoms. She thought the Effexor was increasing her headaches, and started taking sporadically, she was taking it anywhere from every other day to once a week. Last dose was Sunday, but she had not taken for a full week before then.  Admits to SI but very transient , denies rumination of thoughts or plan.   Medications: Outpatient Medications Prior to Visit  Medication Sig   NURTEC 75 MG TBDP DISSOLVE 1 TABLET ON THE TONGUE EVERY OTHER DAY   QULIPTA 60 MG TABS TAKE ONE TABLET BY MOUTH EVERY DAY   valACYclovir (VALTREX) 1000 MG tablet Take 1 tablet (1,000 mg total) by mouth daily.   cetirizine (ZYRTEC) 10 MG tablet TAKE 1 TABLET BY MOUTH EVERY DAY   cyclobenzaprine (FLEXERIL) 5 MG tablet Take 1  tablet (5 mg total) by mouth 3 (three) times daily as needed for muscle spasms.   oxybutynin (DITROPAN-XL) 5 MG 24 hr tablet Take 1 tablet (5 mg total) by mouth at bedtime.   venlafaxine XR (EFFEXOR XR) 75 MG 24 hr capsule Take 1 capsule (75 mg total) by mouth daily with breakfast. (Patient not taking: Reported on 01/31/2022)   [DISCONTINUED] celecoxib (CELEBREX) 100 MG capsule Take 1 capsule (100 mg total) by mouth 2 (two) times daily.   No facility-administered medications prior to visit.    Review of Systems  Constitutional:  Positive for fatigue. Negative for fever.  Eyes:  Positive for visual disturbance.  Respiratory:  Negative for cough, shortness of breath and wheezing.   Cardiovascular:  Negative for chest pain and leg swelling.  Neurological:  Positive for dizziness, light-headedness and headaches. Negative for speech difficulty and weakness.     Objective    Blood pressure 96/67, pulse 83, height 5\' 4"  (1.626 m), weight 147 lb 11.2 oz (67 kg), SpO2 100 %.   Physical Exam Constitutional:      General: She is awake.     Appearance: She is well-developed.  HENT:     Head: Normocephalic.     Right Ear: Tympanic membrane normal.     Left Ear: Tympanic membrane normal.     Nose: No congestion.  Eyes:     Extraocular Movements: Extraocular movements intact.     Right eye: No nystagmus.     Conjunctiva/sclera: Conjunctivae normal.  Pupils: Pupils are equal, round, and reactive to light.  Cardiovascular:     Rate and Rhythm: Normal rate and regular rhythm.     Heart sounds: Normal heart sounds.  Pulmonary:     Effort: Pulmonary effort is normal.     Breath sounds: Normal breath sounds.  Skin:    General: Skin is warm.  Neurological:     Mental Status: She is alert and oriented to person, place, and time.     Sensory: Sensation is intact.     Motor: No weakness.  Psychiatric:        Attention and Perception: Attention normal.        Mood and Affect: Mood normal.         Speech: Speech normal.        Behavior: Behavior normal. Behavior is cooperative.        Thought Content: Thought content includes suicidal ideation.     No results found for any visits on 01/31/22.  Assessment & Plan     Problem List Items Addressed This Visit       Cardiovascular and Mediastinum   Intractable migraine with aura with status migrainosus - Primary    Gave toradol injection and phenergan injection in office. Monitored for 10 minutes, pt reports improved pain, dizziness, and nausea.  Rx zofran prn nausea, do not take until tomorrow. Rx medrol dose pack to start tomorrow AM for persistent headaches, hx of cervicogenic headache.  Will try to move up neurologist appt.  Strict ED precautions w/ dizziness, numbness, vision changes, weakness.      Relevant Medications   ondansetron (ZOFRAN) 4 MG tablet   methylPREDNISolone (MEDROL DOSEPAK) 4 MG TBPK tablet   Other Relevant Orders   Ambulatory referral to Neurology     Other   Depression, major, single episode, severe ([facility])    Okay with stopping Effexor.  She has appt with psychiatrist later this week. Discussed SI with patient, created a verbal safety plan, advised of mental health urgent care.  Rx xanax advised to use 1/2 to 1 pill at night before bed to sleep until we can figure out a better plan with her psychiatrist.        Relevant Medications   ALPRAZolam (XANAX) 0.5 MG tablet   RESOLVED: Depression, major, single episode, moderate ([facility])           Relevant Medications   ALPRAZolam (XANAX) 0.5 MG tablet   Other Visit Diagnoses     Nausea       Relevant Medications   ondansetron (ZOFRAN) 4 MG tablet   Insomnia due to other mental disorder       Relevant Medications   ALPRAZolam (XANAX) 0.5 MG tablet        Return in about 6 weeks (around 03/14/2022) for depression.      I, Amanda Kirschner, PA-C have reviewed all documentation for this visit. The documentation on  01/31/2022  for the exam,  diagnosis, procedures, and orders are all accurate and complete.    Amanda Kirschner, PA-C  [facility] [phone] (phone) [phone] (fax)  [place]

## 2022-01-31 ENCOUNTER — Other Ambulatory Visit: Payer: Self-pay

## 2022-01-31 ENCOUNTER — Encounter: Payer: Self-pay | Admitting: Physician Assistant

## 2022-01-31 ENCOUNTER — Ambulatory Visit (INDEPENDENT_AMBULATORY_CARE_PROVIDER_SITE_OTHER): Payer: Medicaid Other | Admitting: Physician Assistant

## 2022-01-31 VITALS — BP 96/67 | HR 83 | Ht 64.0 in | Wt 147.7 lb

## 2022-01-31 DIAGNOSIS — F99 Mental disorder, not otherwise specified: Secondary | ICD-10-CM

## 2022-01-31 DIAGNOSIS — F322 Major depressive disorder, single episode, severe without psychotic features: Secondary | ICD-10-CM | POA: Insufficient documentation

## 2022-01-31 DIAGNOSIS — R110 Nausea: Secondary | ICD-10-CM

## 2022-01-31 DIAGNOSIS — F5105 Insomnia due to other mental disorder: Secondary | ICD-10-CM | POA: Diagnosis not present

## 2022-01-31 DIAGNOSIS — G43111 Migraine with aura, intractable, with status migrainosus: Secondary | ICD-10-CM | POA: Diagnosis not present

## 2022-01-31 DIAGNOSIS — F321 Major depressive disorder, single episode, moderate: Secondary | ICD-10-CM

## 2022-01-31 MED ORDER — METHYLPREDNISOLONE 4 MG PO TBPK: ORAL_TABLET | Freq: Every day | ORAL | 0 refills | Status: DC

## 2022-01-31 MED ORDER — PROMETHAZINE HCL 50 MG/ML IJ SOLN
25.0000 mg | Freq: Once | INTRAMUSCULAR | Status: AC
  Administered 2022-01-31: 25 mg via INTRAMUSCULAR

## 2022-01-31 MED ORDER — ONDANSETRON HCL 4 MG PO TABS: 4.0000 mg | ORAL_TABLET | Freq: Three times a day (TID) | ORAL | 0 refills | Status: DC | PRN

## 2022-01-31 MED ORDER — ALPRAZOLAM 0.5 MG PO TABS: 0.2500 mg | ORAL_TABLET | Freq: Every evening | ORAL | 0 refills | Status: DC | PRN

## 2022-01-31 MED ORDER — KETOROLAC TROMETHAMINE 30 MG/ML IJ SOLN
30.0000 mg | Freq: Once | INTRAMUSCULAR | Status: AC
  Administered 2022-01-31: 30 mg via INTRAMUSCULAR

## 2022-01-31 NOTE — Assessment & Plan Note (Signed)
Okay with stopping Effexor.  She has appt with psychiatrist later this week. Discussed SI with patient, created a verbal safety plan, advised of mental health urgent care.  Rx xanax advised to use 1/2 to 1 pill at night before bed to sleep until we can figure out a better plan with her psychiatrist.

## 2022-01-31 NOTE — Patient Instructions (Addendum)
Safety Plan:  Distractions: -Coloring -Spending time with family  Contacts: -Emergency: Noni Saupe  [employer]  National Suicide Prevention Lifeline 1-800-273-TALK (727)868-0512)  Www.suicidepreventionlifeline.org

## 2022-01-31 NOTE — Assessment & Plan Note (Addendum)
Gave toradol injection and phenergan injection in office. Monitored for 10 minutes, pt reports improved pain, dizziness, and nausea.  Rx zofran prn nausea, do not take until tomorrow. Rx medrol dose pack to start tomorrow AM for persistent headaches, hx of cervicogenic headache.  Will try to move up neurologist appt.  Strict ED precautions w/ dizziness, numbness, vision changes, weakness.

## 2022-02-02 ENCOUNTER — Ambulatory Visit (INDEPENDENT_AMBULATORY_CARE_PROVIDER_SITE_OTHER): Payer: Medicaid Other | Admitting: Psychiatry

## 2022-02-02 ENCOUNTER — Encounter: Payer: Self-pay | Admitting: Psychiatry

## 2022-02-02 ENCOUNTER — Other Ambulatory Visit: Payer: Self-pay

## 2022-02-02 VITALS — BP 124/76 | HR 108 | Temp 98.7°F | Ht 64.25 in | Wt 152.6 lb

## 2022-02-02 DIAGNOSIS — F411 Generalized anxiety disorder: Secondary | ICD-10-CM

## 2022-02-02 DIAGNOSIS — F330 Major depressive disorder, recurrent, mild: Secondary | ICD-10-CM | POA: Insufficient documentation

## 2022-02-02 MED ORDER — HYDROXYZINE PAMOATE 25 MG PO CAPS: 25.0000 mg | ORAL_CAPSULE | Freq: Every evening | ORAL | 1 refills | Status: DC | PRN

## 2022-02-02 MED ORDER — ESCITALOPRAM OXALATE 5 MG PO TABS: 5.0000 mg | ORAL_TABLET | Freq: Every day | ORAL | 0 refills | Status: DC

## 2022-02-02 NOTE — Patient Instructions (Signed)
Hydroxyzine Capsules or Tablets What is this medication? HYDROXYZINE (hye Chalfant i zeen) treats the symptoms of allergies and allergic reactions. It may also be used to treat anxiety or cause drowsiness before a procedure. It works by blocking histamine, a substance released by the body during an allergic reaction. It belongs to a group of medications called antihistamines. This medicine may be used for other purposes; ask your health care provider or pharmacist if you have questions. COMMON BRAND NAME(S): ANX, Atarax, Rezine, Vistaril What should I tell my care team before I take this medication? They need to know if you have any of these conditions: Glaucoma Heart disease History of irregular heartbeat Kidney disease Liver disease Lung or breathing disease, like asthma Stomach or intestine problems Thyroid disease Trouble passing urine An unusual or allergic reaction to hydroxyzine, cetirizine, other medications, foods, dyes or preservatives Pregnant or trying to get pregnant Breast-feeding How should I use this medication? Take this medication by mouth with a full glass of water. Follow the directions on the prescription label. You may take this medication with food or on an empty stomach. Take your medication at regular intervals. Do not take your medication more often than directed. Talk to your care team regarding the use of this medication in children. Special care may be needed. While this medication may be prescribed for children as young as 1 years of age for selected conditions, precautions do apply. Patients over 15 years old may have a stronger reaction and need a smaller dose. Overdosage: If you think you have taken too much of this medicine contact a poison control center or emergency room at once. NOTE: This medicine is only for you. Do not share this medicine with others. What if I miss a dose? If you miss a dose, take it as soon as you can. If it is almost time for your next  dose, take only that dose. Do not take double or extra doses. What may interact with this medication? Do not take this medication with any of the following: Cisapride Dronedarone Pimozide Thioridazine This medication may also interact with the following: Alcohol Antihistamines for allergy, cough, and cold Atropine Barbiturate medications for sleep or seizures, like phenobarbital Certain antibiotics like erythromycin or clarithromycin Certain medications for anxiety or sleep Certain medications for bladder problems like oxybutynin, tolterodine Certain medications for depression or psychotic disturbances Certain medications for irregular heart beat Certain medications for Parkinson's disease like benztropine, trihexyphenidyl Certain medications for seizures like phenobarbital, primidone Certain medications for stomach problems like dicyclomine, hyoscyamine Certain medications for travel sickness like scopolamine Ipratropium Narcotic medications for pain Other medications that prolong the QT interval (which can cause an abnormal heart rhythm) like dofetilide This list may not describe all possible interactions. Give your health care provider a list of all the medicines, herbs, non-prescription drugs, or dietary supplements you use. Also tell them if you smoke, drink alcohol, or use illegal drugs. Some items may interact with your medicine. What should I watch for while using this medication? Tell your care team if your symptoms do not improve. You may get drowsy or dizzy. Do not drive, use machinery, or do anything that needs mental alertness until you know how this medication affects you. Do not stand or sit up quickly, especially if you are an older patient. This reduces the risk of dizzy or fainting spells. Alcohol may interfere with the effect of this medication. Avoid alcoholic drinks. Your mouth may get dry. Chewing sugarless gum or sucking hard  candy, and drinking plenty of water may  help. Contact your care team if the problem does not go away or is severe. This medication may cause dry eyes and blurred vision. If you wear contact lenses you may feel some discomfort. Lubricating drops may help. See your eye care specialist if the problem does not go away or is severe. If you are receiving skin tests for allergies, tell your care team you are using this medication. What side effects may I notice from receiving this medication? Side effects that you should report to your care team as soon as possible: Allergic reactions--skin rash, itching, hives, swelling of the face, lips, tongue, or throat Heart rhythm changes--fast or irregular heartbeat, dizziness, feeling faint or lightheaded, chest pain, trouble breathing Side effects that usually do not require medical attention (report to your care team if they continue or are bothersome): Confusion Drowsiness Dry mouth Hallucinations Headache This list may not describe all possible side effects. Call your doctor for medical advice about side effects. You may report side effects to FDA at 1-800-FDA-1088. Where should I keep my medication? Keep out of the reach of children and pets. Store at room temperature between 15 and 30 degrees C (59 and 86 degrees F). Keep container tightly closed. Throw away any unused medication after the expiration date. NOTE: This sheet is a summary. It may not cover all possible information. If you have questions about this medicine, talk to your doctor, pharmacist, or health care provider.  2022 [employer]/Gold Standard (2021-02-16 00:00:00) Escitalopram Tablets What is this medication? ESCITALOPRAM (es sye TAL oh pram) treats depression and anxiety. It increases the amount of serotonin in the brain, a hormone that helps regulate mood. It belongs to a group of medications called SSRIs. This medicine may be used for other purposes; ask your health care provider or pharmacist if you have questions. COMMON  BRAND NAME(S): Lexapro What should I tell my care team before I take this medication? They need to know if you have any of these conditions: Bipolar disorder or a family history of bipolar disorder Diabetes Glaucoma Heart disease Kidney or liver disease Receiving electroconvulsive therapy Seizures Suicidal thoughts, plans, or attempt by you or a family member An unusual or allergic reaction to escitalopram, the related medication citalopram, other medications, foods, dyes, or preservatives Pregnant or trying to become pregnant Breast-feeding How should I use this medication? Take this medication by mouth with a glass of water. Follow the directions on the prescription label. You can take it with or without food. If it upsets your stomach, take it with food. Take your medication at regular intervals. Do not take it more often than directed. Do not stop taking this medication suddenly except upon the advice of your care team. Stopping this medication too quickly may cause serious side effects or your condition may worsen. A special MedGuide will be given to you by the pharmacist with each prescription and refill. Be sure to read this information carefully each time. Talk to your care team regarding the use of this medication in children. Special care may be needed. Overdosage: If you think you have taken too much of this medicine contact a poison control center or emergency room at once. NOTE: This medicine is only for you. Do not share this medicine with others. What if I miss a dose? If you miss a dose, take it as soon as you can. If it is almost time for your next dose, take only that dose. Do not  take double or extra doses. What may interact with this medication? Do not take this medication with any of the following: Certain medications for fungal infections like fluconazole, itraconazole, ketoconazole, posaconazole, voriconazole Cisapride Citalopram Dronedarone Linezolid MAOIs like  Carbex, Eldepryl, Marplan, Nardil, and Parnate Methylene blue (injected into a vein) Pimozide Thioridazine This medication may also interact with the following: Alcohol Amphetamines Aspirin and aspirin-like medications Carbamazepine Certain medications for depression, anxiety, or psychotic disturbances Certain medications for migraine headache like almotriptan, eletriptan, frovatriptan, naratriptan, rizatriptan, sumatriptan, zolmitriptan Certain medications for sleep Certain medications that treat or prevent blood clots like warfarin, enoxaparin, dalteparin Cimetidine Diuretics Dofetilide Fentanyl Furazolidone Isoniazid Lithium Metoprolol NSAIDs, medications for pain and inflammation, like ibuprofen or naproxen Other medications that prolong the QT interval (cause an abnormal heart rhythm) Procarbazine Rasagiline Supplements like St. John's wort, kava kava, valerian Tramadol Tryptophan Ziprasidone This list may not describe all possible interactions. Give your health care provider a list of all the medicines, herbs, non-prescription drugs, or dietary supplements you use. Also tell them if you smoke, drink alcohol, or use illegal drugs. Some items may interact with your medicine. What should I watch for while using this medication? Tell your care team if your symptoms do not get better or if they get worse. Visit your care team for regular checks on your progress. Because it may take several weeks to see the full effects of this medication, it is important to continue your treatment as prescribed by your care team. Watch for new or worsening thoughts of suicide or depression. This includes sudden changes in mood, behaviors, or thoughts. These changes can happen at any time but are more common in the beginning of treatment or after a change in dose. Call your care team right away if you experience these thoughts or worsening depression. Manic episodes may happen in patients with  bipolar disorder who take this medication. Watch for changes in feelings or behaviors such as feeling anxious, nervous, agitated, panicky, irritable, hostile, aggressive, impulsive, severely restless, overly excited and hyperactive, or trouble sleeping. These symptoms can happen at any time but are more common in the beginning of treatment or after a change in dose. Call your care team right away if you notice any of these symptoms. You may get drowsy or dizzy. Do not drive, use machinery, or do anything that needs mental alertness until you know how this medication affects you. Do not stand or sit up quickly, especially if you are an older patient. This reduces the risk of dizzy or fainting spells. Alcohol may interfere with the effect of this medication. Avoid alcoholic drinks. Your mouth may get dry. Chewing sugarless gum or sucking hard candy, and drinking plenty of water may help. Contact your care team if the problem does not go away or is severe. What side effects may I notice from receiving this medication? Side effects that you should report to your care team as soon as possible: Allergic reactions--skin rash, itching, hives, swelling of the face, lips, tongue, or throat Bleeding--bloody or black, tar-like stools, red or dark brown urine, vomiting blood or brown material that looks like coffee grounds, small, red or purple spots on skin, unusual bleeding or bruising Heart rhythm changes--fast or irregular heartbeat, dizziness, feeling faint or lightheaded, chest pain, trouble breathing Low sodium level--muscle weakness, fatigue, dizziness, headache, confusion Serotonin syndrome--irritability, confusion, fast or irregular heartbeat, muscle stiffness, twitching muscles, sweating, high fever, seizure, chills, vomiting, diarrhea Sudden eye pain or change in vision such as blurry  vision, seeing halos around lights, vision loss Thoughts of suicide or self-harm, worsening mood, feelings of  depression Side effects that usually do not require medical attention (report to your care team if they continue or are bothersome): Change in sex drive or performance Diarrhea Excessive sweating Nausea Tremors or shaking Upset stomach This list may not describe all possible side effects. Call your doctor for medical advice about side effects. You may report side effects to FDA at 1-800-FDA-1088. Where should I keep my medication? Keep out of reach of children and pets. Store at room temperature between 15 and 30 degrees C (59 and 86 degrees F). Throw away any unused medication after the expiration date. NOTE: This sheet is a summary. It may not cover all possible information. If you have questions about this medicine, talk to your doctor, pharmacist, or health care provider.  2022 [employer]/Gold Standard (2020-11-22 00:00:00)
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
## 2022-02-08 ENCOUNTER — Encounter: Payer: Self-pay | Admitting: Obstetrics & Gynecology

## 2022-02-10 ENCOUNTER — Encounter: Payer: Self-pay | Admitting: Licensed Practical Nurse

## 2022-02-10 ENCOUNTER — Other Ambulatory Visit: Payer: Self-pay

## 2022-02-10 ENCOUNTER — Other Ambulatory Visit (HOSPITAL_COMMUNITY)
Admission: RE | Admit: 2022-02-10 | Discharge: 2022-02-10 | Disposition: A | Discharge status: Home / Self Care | Payer: Medicaid Other | Source: Ambulatory Visit | Attending: Licensed Practical Nurse | Admitting: Licensed Practical Nurse

## 2022-02-10 ENCOUNTER — Ambulatory Visit (INDEPENDENT_AMBULATORY_CARE_PROVIDER_SITE_OTHER): Payer: Medicaid Other | Admitting: Licensed Practical Nurse

## 2022-02-10 VITALS — BP 120/60 | Ht 64.0 in | Wt 153.0 lb

## 2022-02-10 DIAGNOSIS — Z113 Encounter for screening for infections with a predominantly sexual mode of transmission: Secondary | ICD-10-CM | POA: Diagnosis present

## 2022-02-10 DIAGNOSIS — R102 Pelvic and perineal pain: Secondary | ICD-10-CM

## 2022-02-10 DIAGNOSIS — R399 Unspecified symptoms and signs involving the genitourinary system: Secondary | ICD-10-CM | POA: Diagnosis not present

## 2022-02-10 DIAGNOSIS — B009 Herpesviral infection, unspecified: Secondary | ICD-10-CM

## 2022-02-10 DIAGNOSIS — Z01419 Encounter for gynecological examination (general) (routine) without abnormal findings: Secondary | ICD-10-CM | POA: Diagnosis present

## 2022-02-10 DIAGNOSIS — Z124 Encounter for screening for malignant neoplasm of cervix: Secondary | ICD-10-CM

## 2022-02-10 DIAGNOSIS — R300 Dysuria: Secondary | ICD-10-CM

## 2022-02-10 LAB — POCT URINALYSIS DIPSTICK
Bilirubin, UA: NEGATIVE
Blood, UA: NEGATIVE
Glucose, UA: NEGATIVE
Ketones, UA: NEGATIVE
Leukocytes, UA: NEGATIVE
Nitrite, UA: NEGATIVE
Protein, UA: POSITIVE — AB
Spec Grav, UA: 1.01 (ref 1.010–1.025)
Urobilinogen, UA: 0.2 E.U./dL
pH, UA: 7 (ref 5.0–8.0)

## 2022-02-10 NOTE — Progress Notes (Signed)
NEUROLOGY FOLLOW UP OFFICE NOTE  Amanda [name] [id number]  Assessment/Plan:   Migraine without aura, cervicogenic   1.Migraine prevention:  start gabapentin 361m at bedtime 2.Migraine rescue:  Will try samples of Ubrelvy 1061m3.Limit use of pain relievers to no more than 2 days out of week to prevent risk of rebound or medication-overuse headache. 4.Keep headache diary 5.Continue OMM 6.Follow up 4 months   Subjective:  Amanda [name] a [age]ear old right-handed female who follows up for migraines.   UPDATE: Amanda Chimetopped being effective.  Migraines were getting worse.  Nurtec no longer effective.  Seeing Sports Medicine for neck pain which is helping.   Intensity:  mild-moderate Duration:  10-15 minutes with Nurtec. Frequency:  almost daily Frequency of abortive medication: 1 in last 4 days Takes Tylenol after lunch once daily or every other day. Current NSAIDS/analgesics:  none Current triptans:  none Current ergotamine:  none Current anti-emetic:  none Current muscle relaxants:  none Current Antihypertensive medications:  none Current Antidepressant medications:  Lexapro 5g Current Anticonvulsant medications:  none Current anti-CGRP:  none Current Vitamins/Herbal/Supplements:  none Current Antihistamines/Decongestants:  Zyrtec Other therapy:  OMM Hormone/birth control:  none   Caffeine:  No coffee.  Sometimes a Red Bull Diet:  Hydrates with water.  Tries not to skip meals.  Occasional soda Exercise:  Walks routine Depression:  improved; Anxiety:  no Other pain:  no Sleep hygiene:  At least 6-7 hours   HISTORY:  Migraines since teenager.  They are bifrontal/facial pressure with photophobia, phonophobia, dizziness and sometimes nausea.  In February 2018, she had a migraine where she lost vision in the left eye.  MRI of brain with and without contrast on 01/29/2017 personally reviewed was normal.  They would last 4-5 hours and occur every 2 to 3 days.. Every 2 to 3  days, not as severe, 4 to 5 hours.  Improved during pregnancy in 2020 but became worse following birth of her twins.  Since late 2020, they are daily, lasting 4-5 hours (but within 30 minutes with Nurtec).  They are moderate in the morning and gradually become more severe later in the day with worsening dizziness.  She takes Tylenol once a day to every other day.       Past NSAIDS/analgesics:  Ibuprofen (contraindicated - stomach), Excedrin, acetaminophen Past abortive triptans:  Sumatriptan (lip and tongue swelling), rizatriptan (drowsiness) Past abortive ergotamine:  none Past muscle relaxants:  none Past anti-emetic:  Zofran 62m35mast antihypertensive medications:  none Past antidepressant medications:  Amitriptyline (drowsiness), sertraline Past anticonvulsant medications:  none Past anti-CGRP:  Emgality (painful), Nurtec (ineffective) Past vitamins/Herbal/Supplements:  none Past antihistamines/decongestants:  none     Family history of headache:  Mom (chronic migraines)  PAST MEDICAL HISTORY: Past Medical History:  Diagnosis Date   BRCA negative 09/2020   MyRisk neg except AXIN2 VUS   Family history of adverse reaction to anesthesia    heart condition   Family history of breast cancer 09/2020   IBIS=15.7%/riskscore=14.7%   Family history of pancreatic cancer    GERD (gastroesophageal reflux disease)    Migraine     MEDICATIONS: Current Outpatient Medications on File Prior to Visit  Medication Sig Dispense Refill   ALPRAZolam (XANAX) 0.5 MG tablet Take 0.5-1 tablets (0.25-0.5 mg total) by mouth at bedtime as needed for anxiety or sleep. 10 tablet 0   escitalopram (LEXAPRO) 5 MG tablet Take 1 tablet (5 mg total) by mouth daily with breakfast. 30 tablet 0  hydrOXYzine (VISTARIL) 25 MG capsule Take 1-2 capsules (25-50 mg total) by mouth at bedtime as needed. For anxiety and sleep 60 capsule 1   methylPREDNISolone (MEDROL DOSEPAK) 4 MG TBPK tablet Take by mouth daily. Please  take 6 pills on day 1, 5 pills on day 2, 4 pills on day 3, etc. 21 each 0   NURTEC 75 MG TBDP DISSOLVE 1 TABLET ON THE TONGUE EVERY OTHER DAY (Patient not taking: Reported on 02/02/2022) 16 tablet 2   ondansetron (ZOFRAN) 4 MG tablet Take 1 tablet (4 mg total) by mouth every 8 (eight) hours as needed for nausea or vomiting. 20 tablet 0   QULIPTA 60 MG TABS TAKE ONE TABLET BY MOUTH EVERY DAY (Patient not taking: Reported on 02/02/2022) 30 tablet 0   valACYclovir (VALTREX) 1000 MG tablet Take 1 tablet (1,000 mg total) by mouth daily. 90 tablet 2   [DISCONTINUED] dicyclomine (BENTYL) 20 MG tablet Take 1 tablet (20 mg total) by mouth 2 (two) times daily. (Patient not taking: No sig reported) 20 tablet 0   [DISCONTINUED] norethindrone (MICRONOR) 0.35 MG tablet Take 1 tablet (0.35 mg total) by mouth daily. (Patient not taking: No sig reported) 30 tablet 6   [DISCONTINUED] omeprazole (PRILOSEC) 40 MG capsule Take 1 capsule (40 mg total) by mouth in the morning and at bedtime. (Patient not taking: Reported on 01/04/2021) 60 capsule 2   No current facility-administered medications on file prior to visit.    ALLERGIES: Allergies  Allergen Reactions   Imitrex [Sumatriptan] Swelling    Facial swelling and burning lips   Penicillins Hives    Did it involve swelling of the face/tongue/throat, SOB, or low BP? No Did it involve sudden or severe rash/hives, skin peeling, or any reaction on the inside of your mouth or nose? Yes Did you need to seek medical attention at a hospital or doctor's office? Yes When did it last happen?      childhood allergy If all above answers are NO, may proceed with cephalosporin use.    FAMILY HISTORY: Family History  Problem Relation Age of Onset   Migraines Mother    Skin cancer Father    Asthma Maternal Grandmother    Hypertension Maternal Grandmother    Thyroid disease Maternal Grandmother    Hypertension Maternal Grandfather    Pancreatic cancer Maternal Grandfather  80   Colon cancer Maternal Grandfather 60   Hypertension Paternal Grandmother    Diabetes Paternal Grandmother    Hypertension Paternal Grandfather    Diabetes Paternal Grandfather    Colon cancer Paternal Grandfather 3   Thyroid disease Maternal Aunt    Breast cancer Maternal Aunt 45   Allergies Brother       Objective:  Blood pressure (!) 109/53, pulse (!) 128, height 5' 4"  (1.626 m), weight 156 lb 6.4 oz (70.9 kg), last menstrual period 01/23/2022, SpO2 93 %. General: No acute distress.  Patient appears well-groomed.     Metta Clines, DO  CC: [name], PA-C

## 2022-02-10 NOTE — Progress Notes (Signed)
Gynecology Annual Exam  PCP: [name], PA-C  Chief Complaint:  Chief Complaint  Patient presents with   Urinary Tract Infection    Freq urination, burning feeling, little bit of blood when wiping    History of Present Illness: Patient is a 26 y.o. G1P0102 presents for annual exam. Patient here for herpes outbreak, mentioned she was scheduled for a pap on Monday, Visit changed to annual exam. Pt had had ASC-US on pap 6 months ago,was advised to repeat pap in 6 months.   Has had vaginal pain, burning with urination and prodromal symptoms consistent with HSV. She and her partner looked at her vaginal and saw lesions both,  inside her labia and on the outside. She takes Valtrex 1,000mg  daily, she has been on this does for the last 6 months.   LMP: Patient's last menstrual period was 01/23/2022 (exact date).  Postcoital Bleeding: no   The patient is sexually active. She currently uses vasectomy for contraception. She has dyspareunia-only when she is about to have an outbreak, they abstain during outbreaks.  The patient does perform self breast exams.  There is no notable family history of breast or ovarian cancer in her family.  The patient wears seatbelts: yes.  The patient has regular exercise: yes.    The patient repots current symptoms of depression.    Review of Systems: ROS see above   Past Medical History:  Patient Active Problem List   Diagnosis Date Noted   MDD (major depressive disorder), recurrent episode, mild ([place]) 02/02/2022   Intractable migraine with aura with status migrainosus 01/31/2022   Depression, major, single episode, severe ([place]) 01/31/2022   Cervicogenic headache 12/15/2021   Somatic dysfunction of cervical region 12/15/2021   Neck muscle spasm 11/03/2021   GAD (generalized anxiety disorder) 11/03/2021   History of affective disorder 11/03/2021   CIN I (cervical intraepithelial neoplasia I) 02/14/2021   Genital herpes simplex type 2  02/10/2021   Vitamin D deficiency 06/28/2020   History of chronic sinusitis 06/28/2020   Monochorionic diamniotic twin gestation 05/20/2019    Monochorionic/diamniotic at 7 week u/s [x]  referral to [facility] for co-management. [x]  ECHO  Monochorionic Diamniotic Twin Pregnancy Plan  Mom should take 60-120mg  of elemental iron a day as well as 1mg  of folic acid  67-12 wks: Evaluate for early growth discordance between  16 wks: Begin Ultrasounds for TTTS checks every 2 weeks (monitor MVP* for each twin and visualize the bladder) (done) 18-20 wks:  Anatomy Ultrasound 22 wks: Fetal Echo - ordered 20-Delivery: Growth Ultrasounds every 2-4 weeks** 32 wks: Initiate NSTs  Delivery No complications: 34 0/7 - 37 6/7 Isolated IUGR: 32 0/7- 34 6/7  Genetic screening offered with first trimester screen (11-14 wks) or CVS sampling (10-12 wks)  Risks: TTTS complicates up to 45-80% of monochorionic pregnancies TAPS complicates 9-9% of monochorionic pregnancies TRAP complicates 1% of monochorionic pregnancies A fetal demise is one twin results in a 40-50% risk of death of neurologic handicap in the surviving twin There is a ninefold increased risk of congenital heart defects, 4-5%. Normal risk in 0.5%. Perinatal loss and handicap rate is 3-5 times higher than a dichorionic pregnancy  *MVP for either twin > 8cm or < 2cm should result in MFM referral Weekly [place] if issues with MVP arise  **IUGR or Growth discordance > 20% should prompt MFM consultation CALCULATOR: [email or web address]     Chronic migraine without aura 02/11/2017   Vision loss of left eye 01/17/2017  Acne 11/18/2014    Past Surgical History:  Past Surgical History:  Procedure Laterality Date   NO PAST SURGERIES     WISDOM TOOTH EXTRACTION      Gynecologic History:  Patient's last menstrual period was 01/23/2022 (exact date). Contraception: vasectomy Last Pap: Results were: 07/2021   ASC-US HPV not tested    LSIL in 2021  Obstetric History: G1P0102  Family History:  Family History  Problem Relation Age of Onset   Migraines Mother    Skin cancer Father    Asthma Maternal Grandmother    Hypertension Maternal Grandmother    Thyroid disease Maternal Grandmother    Hypertension Maternal Grandfather    Pancreatic cancer Maternal Grandfather 65   Colon cancer Maternal Grandfather 60   Hypertension Paternal Grandmother    Diabetes Paternal Grandmother    Hypertension Paternal Grandfather    Diabetes Paternal Grandfather    Colon cancer Paternal Grandfather 19   Thyroid disease Maternal Aunt    Breast cancer Maternal Aunt 45   Allergies Brother     Social History:  Social History   Socioeconomic History   Marital status: Significant Other    Spouse name: Physicist, medical   Number of children: 2   Years of education: Not on file   Highest education level: Associate degree: occupational, Hotel manager, or vocational program  Occupational History   Occupation: Massage Therapy  Tobacco Use   Smoking status: Never   Smokeless tobacco: Never  Vaping Use   Vaping Use: Never used  Substance and Sexual Activity   Alcohol use: Never   Drug use: No   Sexual activity: Yes    Partners: Male    Birth control/protection: None  Other Topics Concern   Not on file  Social History Narrative   ** Merged History Encounter **       One story home Right-handed Caffeine: occasional coffee   Social Determinants of Radio broadcast assistant Strain: Not on file  Food Insecurity: Not on file  Transportation Needs: Not on file  Physical Activity: Not on file  Stress: Stress Concern Present   Feeling of Stress : Rather much  Social Connections: Not on file  Intimate Partner Violence: Not on file    Allergies:  Allergies  Allergen Reactions   Imitrex [Sumatriptan] Swelling    Facial swelling and burning lips   Penicillins Hives    Did it involve swelling of the  face/tongue/throat, SOB, or low BP? No Did it involve sudden or severe rash/hives, skin peeling, or any reaction on the inside of your mouth or nose? Yes Did you need to seek medical attention at a hospital or doctor's office? Yes When did it last happen?      childhood allergy If all above answers are NO, may proceed with cephalosporin use.    Medications: Prior to Admission medications   Medication Sig Start Date End Date Taking? Authorizing Provider  escitalopram (LEXAPRO) 5 MG tablet Take 1 tablet (5 mg total) by mouth daily with breakfast. 02/02/22  Yes Eappen, Ria Clock, MD  hydrOXYzine (VISTARIL) 25 MG capsule Take 1-2 capsules (25-50 mg total) by mouth at bedtime as needed. For anxiety and sleep 02/02/22  Yes Eappen, Ria Clock, MD  valACYclovir (VALTREX) 1000 MG tablet Take 1 tablet (1,000 mg total) by mouth daily. 4/0/81  Yes Copland, [name] Evener, PA-C  ALPRAZolam (XANAX) 0.5 MG tablet Take 0.5-1 tablets (0.25-0.5 mg total) by mouth at bedtime as needed for anxiety or sleep. Patient not taking: Reported  on 02/10/2022 01/31/22   [name], PA-C  NURTEC 75 MG TBDP DISSOLVE 1 TABLET ON THE TONGUE EVERY OTHER DAY Patient not taking: Reported on 02/02/2022 11/07/21   Melvenia Beam, MD  dicyclomine (BENTYL) 20 MG tablet Take 1 tablet (20 mg total) by mouth 2 (two) times daily. Patient not taking: No sig reported 10/11/20 01/12/21  Faustino Congress, NP  norethindrone (MICRONOR) 0.35 MG tablet Take 1 tablet (0.35 mg total) by mouth daily. Patient not taking: No sig reported 09/28/20 01/12/21  Gae Dry, MD  omeprazole (PRILOSEC) 40 MG capsule Take 1 capsule (40 mg total) by mouth in the morning and at bedtime. Patient not taking: Reported on 01/04/2021 10/25/20 01/12/21  Jonathon Bellows, MD    Physical Exam Vitals: Blood pressure 120/60, height 5\' 4"  (1.626 m), weight 153 lb (69.4 kg), last menstrual period 01/23/2022.  General: NAD HEENT: normocephalic, anicteric Abdomen: NABS, soft,  non-tender, non-distended.  Umbilicus without lesions.  No hepatomegaly, splenomegaly or masses palpable. No evidence of hernia  Genitourinary:  External: Normal external female genitalia.  Normal urethral meatus, normal Bartholin's and Skene's glands.  1 <52mm open sore on perineum.  No other lesions visible.   Vagina: Normal vaginal mucosa, no evidence of prolapse.  No lesions. White homogenous discharge present.   Cervix: Grossly normal in appearance, no bleeding. No lesions   Uterus: Non-enlarged, mobile, normal contour.  No CMT  Adnexa: ovaries non-enlarged, no adnexal masses  Rectal: deferred  Lymphatic: no evidence of inguinal lymphadenopathy Extremities: no edema, erythema, or tenderness Neurologic: Grossly intact Psychiatric: mood appropriate, affect full  Assessment: 26 y.o. G1P0102 repeat pap and herpes outbreak   Plan: Problem List Items Addressed This Visit   None Visit Diagnoses     Well woman exam    -  Primary   Relevant Orders   Cytology - PAP   NuSwab Vaginitis (VG)   UTI symptoms       Relevant Orders   POCT Urinalysis Dipstick (Completed)   Screening examination for venereal disease       Relevant Orders   Cytology - PAP   HEP, RPR, HIV Panel   Hepatitis C antibody   Cervical cancer screening       Relevant Orders   Cytology - PAP   Dysuria       Relevant Orders   Urine Culture   Vaginal pain       Relevant Orders   NuSwab Vaginitis (VG)       1) 4) Gardasil Series discussed and if applicable offered to patient - Patient has previously completed 3 shot series   2) STI screening  wasoffered and accepted  3)  ASCCP guidelines and rational discussed.  Patient opts for based on results screening interval  4) Contraception - the patient is currently using  vasectomy.  She is happy with her current form of contraception and plans to continue We discussed safe sex practices to reduce her furture risk of STI's.    5) No follow-ups on file.   Roberto Scales, [facility], [facility] 02/10/2022, 4:46 PM

## 2022-02-11 LAB — HEP, RPR, HIV PANEL
HIV Screen 4th Generation wRfx: NONREACTIVE
Hepatitis B Surface Ag: NEGATIVE
RPR Ser Ql: NONREACTIVE

## 2022-02-11 LAB — HEPATITIS C ANTIBODY: Hep C Virus Ab: NONREACTIVE

## 2022-02-13 ENCOUNTER — Ambulatory Visit: Payer: Medicaid Other | Admitting: Neurology

## 2022-02-13 ENCOUNTER — Other Ambulatory Visit: Payer: Self-pay

## 2022-02-13 ENCOUNTER — Telehealth (INDEPENDENT_AMBULATORY_CARE_PROVIDER_SITE_OTHER): Payer: Medicaid Other | Admitting: Psychiatry

## 2022-02-13 ENCOUNTER — Encounter: Payer: Self-pay | Admitting: Neurology

## 2022-02-13 ENCOUNTER — Encounter: Payer: Self-pay | Admitting: Psychiatry

## 2022-02-13 VITALS — BP 109/53 | HR 128 | Ht 64.0 in | Wt 156.4 lb

## 2022-02-13 DIAGNOSIS — F411 Generalized anxiety disorder: Secondary | ICD-10-CM

## 2022-02-13 DIAGNOSIS — G43009 Migraine without aura, not intractable, without status migrainosus: Secondary | ICD-10-CM | POA: Diagnosis not present

## 2022-02-13 DIAGNOSIS — F3341 Major depressive disorder, recurrent, in partial remission: Secondary | ICD-10-CM | POA: Diagnosis not present

## 2022-02-13 DIAGNOSIS — F330 Major depressive disorder, recurrent, mild: Secondary | ICD-10-CM

## 2022-02-13 DIAGNOSIS — M542 Cervicalgia: Secondary | ICD-10-CM

## 2022-02-13 LAB — URINE CULTURE

## 2022-02-13 LAB — NUSWAB VAGINITIS (VG)
Candida albicans, NAA: NEGATIVE
Candida glabrata, NAA: NEGATIVE
Trich vag by NAA: NEGATIVE

## 2022-02-13 MED ORDER — GABAPENTIN 300 MG PO CAPS: 300.0000 mg | ORAL_CAPSULE | Freq: Every day | ORAL | 5 refills | Status: DC

## 2022-02-13 NOTE — Progress Notes (Signed)
[place] [facility] Phone: [phone] Subjective:   Amanda [name], am serving as a scribe for Dr. Hulan Saas.  This visit occurred during the SARS-CoV-2 public health emergency.  Safety protocols were in place, including screening questions prior to the visit, additional usage of staff PPE, and extensive cleaning of exam room while observing appropriate contact time as indicated for disinfecting solutions.  I'm seeing this patient by the request  of:  Amanda Kirschner, PA-C  CC: back and neck pain   VZD:GLOVFIEPPI  Amanda [name] is a 26 y.o. female coming in with complaint of back and neck pain. OMT on 01/17/2022. Patient states that she has been doing well. Did ride in car on vacation which caused some tightness.   Medications patient has been prescribed: None  Taking:       Reviewed prior external information including notes and imaging from previsou exam, outside providers and external EMR if available.   As well as notes that were available from care everywhere and other healthcare systems.  Past medical history, social, surgical and family history all reviewed in electronic medical record.  [name] pertanent information unless stated regarding to the chief complaint.   Past Medical History:  Diagnosis Date   BRCA negative 09/2020   MyRisk neg except AXIN2 VUS   Family history of adverse reaction to anesthesia    heart condition   Family history of breast cancer 09/2020   IBIS=15.7%/riskscore=14.7%   Family history of pancreatic cancer    GERD (gastroesophageal reflux disease)    Migraine     Allergies  Allergen Reactions   Imitrex [Sumatriptan] Swelling    Facial swelling and burning lips   Penicillins Hives    Did it involve swelling of the face/tongue/throat, SOB, or low BP? [name] Did it involve sudden or severe rash/hives, skin peeling, or any reaction on the inside of your mouth or nose? Yes Did you need  to seek medical attention at a hospital or doctor's office? Yes When did it last happen?      childhood allergy If all above answers are [name], may proceed with cephalosporin use.     Review of Systems:  [name]  visual changes, nausea, vomiting, diarrhea, constipation, dizziness, abdominal pain, skin rash, fevers, chills, night sweats, weight loss, swollen lymph nodes, body aches, joint swelling, chest pain, shortness of breath, mood changes. POSITIVE muscle aches, headache  Objective  Blood pressure 100/64, pulse (!) 122, height 5' 4"  (1.626 m), last menstrual period 01/23/2022, SpO2 96 %.   General: [name] alert and oriented x3 mood and affect normal, dressed appropriately.  HEENT: Pupils equal, extraocular movements intact  Respiratory: Patient's speak in full sentences and does not appear short of breath  Cardiovascular: [name] lower extremity edema, non tender, [name] erythema  Neck and back exam shows   Osteopathic findings  C2 flexed rotated and side bent right C5 flexed rotated and side bent left T3 extended rotated and side bent right inhaled rib T7 extended rotated and side bent left inhaled.      Assessment and Plan:  Cervicogenic headache Patient does have a cervicogenic headache.  It is potentially getting a migraine.  Responded extremely well to osteopathic manipulation.  Discussed icing regimen and home exercises.  Follow-up again in 6 to 8 weeks    Nonallopathic problems  Decision today to treat with OMT was based on Physical Exam  After verbal consent patient was treated with HVLA,  ME, FPR techniques in cervical, rib, thoracic areas  Patient tolerated the procedure well with improvement in symptoms  Patient given exercises, stretches and lifestyle modifications  See medications in patient instructions if given  Patient will follow up in 6-8 weeks      The above documentation has been reviewed and is accurate and complete [name], DO         Note: This dictation was prepared with Dragon dictation along with smaller phrase technology. Any transcriptional errors that result from this process are unintentional.

## 2022-02-13 NOTE — Progress Notes (Signed)
Virtual Visit via Video Note  I connected with Amanda [name] on 02/13/22 at  8:30 AM EST by a video enabled telemedicine application and verified that I am speaking with the correct person using two identifiers.  Location Provider Location : [facility] Patient Location : Home  Participants: Patient , Provider    I discussed the limitations of evaluation and management by telemedicine and the availability of in person appointments. The patient expressed understanding and agreed to proceed.   I discussed the assessment and treatment plan with the patient. The patient was provided an opportunity to ask questions and all were answered. The patient agreed with the plan and demonstrated an understanding of the instructions.   The patient was advised to call back or seek an in-person evaluation if the symptoms worsen or if the condition fails to improve as anticipated.    [place] MD OP Progress Note  02/13/2022 8:53 AM Amanda [name]  MRN:  [id number]  Chief Complaint:  Chief Complaint  Patient presents with   Follow-up: 26 year old female with history of anxiety, MDD, presented for follow-up medication management.   HPI: Amanda [name] is a 26 year old female, engaged, stay-at-home mom, lives in [place], has a history of MDD, GAD was evaluated by telemedicine today.  Patient reports she is currently tolerating the Lexapro.  Denies any side effects at this time.  She has definitely noticed an improvement in her mood symptoms especially depressive symptoms since the last visit.  Currently denies any sadness.  Reports she has been trying to come up with a schedule for herself and her children and that has been very beneficial.  Patient reports she also had a talk with her family and they have been very supportive.  That has helped as well.  Does have some anxiety although it is improving.  Does have appointment scheduled with therapist coming up and is motivated to keep it.  Reports sleep as better, has not  needed any hydroxyzine since it was prescribed.  Denies suicidality, homicidality or perceptual disturbances.  Patient denies any other concerns today.  Visit Diagnosis:    ICD-10-CM   1. GAD (generalized anxiety disorder)  F41.1     2. MDD (major depressive disorder), recurrent, in partial remission ([place])  F33.41       Past Psychiatric History: Reviewed past psychiatric history from progress note on 02/02/2022.  Past trials of Zoloft-did not work, Effexor-made her suicidal.  Past Medical History:  Past Medical History:  Diagnosis Date   BRCA negative 09/2020   MyRisk neg except AXIN2 VUS   Family history of adverse reaction to anesthesia    heart condition   Family history of breast cancer 09/2020   IBIS=15.7%/riskscore=14.7%   Family history of pancreatic cancer    GERD (gastroesophageal reflux disease)    Migraine     Past Surgical History:  Procedure Laterality Date   NO PAST SURGERIES     WISDOM TOOTH EXTRACTION      Family Psychiatric History: Reviewed family psychiatric history from progress note on 02/02/2022  Family History:  Family History  Problem Relation Age of Onset   Migraines Mother    Skin cancer Father    Asthma Maternal Grandmother    Hypertension Maternal Grandmother    Thyroid disease Maternal Grandmother    Hypertension Maternal Grandfather    Pancreatic cancer Maternal Grandfather 50   Colon cancer Maternal Grandfather 60   Hypertension Paternal Grandmother    Diabetes Paternal Grandmother    Hypertension Paternal Merchant navy officer  Diabetes Paternal Grandfather    Colon cancer Paternal Grandfather 64   Thyroid disease Maternal Aunt    Breast cancer Maternal Aunt 34   Allergies Brother     Social History: Reviewed social history from progress note from 02/02/2022 Social History   Socioeconomic History   Marital status: Significant Other    Spouse name: [name]   Number of children: 2   Years of education: Not on file   Highest  education level: Associate degree: occupational, Hotel manager, or vocational program  Occupational History   Occupation: Massage Therapy  Tobacco Use   Smoking status: Never   Smokeless tobacco: Never  Vaping Use   Vaping Use: Never used  Substance and Sexual Activity   Alcohol use: Never   Drug use: No   Sexual activity: Yes    Partners: Male    Birth control/protection: None  Other Topics Concern   Not on file  Social History Narrative   ** Merged History Encounter **       One story home Right-handed Caffeine: occasional coffee   Social Determinants of Radio broadcast assistant Strain: Not on file  Food Insecurity: Not on file  Transportation Needs: Not on file  Physical Activity: Not on file  Stress: Stress Concern Present   Feeling of Stress : Rather much  Social Connections: Not on file    Allergies:  Allergies  Allergen Reactions   Imitrex [Sumatriptan] Swelling    Facial swelling and burning lips   Penicillins Hives    Did it involve swelling of the face/tongue/throat, SOB, or low BP? No Did it involve sudden or severe rash/hives, skin peeling, or any reaction on the inside of your mouth or nose? Yes Did you need to seek medical attention at a hospital or doctor's office? Yes When did it last happen?      childhood allergy If all above answers are NO, may proceed with cephalosporin use.    Metabolic Disorder Labs: Lab Results  Component Value Date   HGBA1C 5.3 01/07/2015   No results found for: PROLACTIN Lab Results  Component Value Date   CHOL 135 06/28/2020   TRIG 54 06/28/2020   HDL 54 06/28/2020   LDLCALC 69 06/28/2020   Lab Results  Component Value Date   TSH 0.749 06/28/2020   TSH 1.06 03/28/2018    Therapeutic Level Labs: No results found for: LITHIUM No results found for: VALPROATE No components found for:  CBMZ  Current Medications: Current Outpatient Medications  Medication Sig Dispense Refill   ALPRAZolam (XANAX) 0.5 MG  tablet Take 0.5-1 tablets (0.25-0.5 mg total) by mouth at bedtime as needed for anxiety or sleep. (Patient not taking: Reported on 02/10/2022) 10 tablet 0   escitalopram (LEXAPRO) 5 MG tablet Take 1 tablet (5 mg total) by mouth daily with breakfast. 30 tablet 0   fluticasone (FLONASE) 50 MCG/ACT nasal spray Place 2 sprays into both nostrils daily.     gabapentin (NEURONTIN) 300 MG capsule Take 1 capsule (300 mg total) by mouth at bedtime. 30 capsule 5   hydrOXYzine (VISTARIL) 25 MG capsule Take 1-2 capsules (25-50 mg total) by mouth at bedtime as needed. For anxiety and sleep 60 capsule 1   NURTEC 75 MG TBDP DISSOLVE 1 TABLET ON THE TONGUE EVERY OTHER DAY (Patient not taking: Reported on 02/02/2022) 16 tablet 2   valACYclovir (VALTREX) 1000 MG tablet Take 1 tablet (1,000 mg total) by mouth daily. 90 tablet 2   valACYclovir (VALTREX) 500 MG  tablet Take 500 mg by mouth daily.     No current facility-administered medications for this visit.     Musculoskeletal: Strength & Muscle Tone:  UTA Gait & Station:  Seated Patient leans: N/A  Psychiatric Specialty Exam: Review of Systems  Psychiatric/Behavioral:  The patient is nervous/anxious.   All other systems reviewed and are negative.  Last menstrual period 01/23/2022.There is no height or weight on file to calculate BMI.  General Appearance: Casual  Eye Contact:  Fair  Speech:  Clear and Coherent  Volume:  Normal  Mood:  Anxious  Affect:  Congruent  Thought Process:  Goal Directed and Descriptions of Associations: Intact  Orientation:  Full (Time, Place, and Person)  Thought Content: Logical   Suicidal Thoughts:  No  Homicidal Thoughts:  No  Memory:  Immediate;   Fair Recent;   Fair Remote;   Fair  Judgement:  Fair  Insight:  Fair  Psychomotor Activity:  Normal  Concentration:  Concentration: Fair and Attention Span: Fair  Recall:  [employer] of Knowledge: Fair  Language: Fair  Akathisia:  No  Handed:  Right  AIMS (if  indicated): not done  Assets:  Communication Skills Desire for Improvement Housing Social Support  ADL's:  Intact  Cognition: WNL  Sleep:  Fair   Screenings: GAD-7    Flowsheet Row Video Visit from 02/13/2022 in [facility] Office Visit from 02/02/2022 in [facility]  Total GAD-7 Score 7 16      PHQ2-9    [place] Video Visit from 02/13/2022 in [facility] Visit from 02/02/2022 in [facility] Visit from 01/31/2022 in [facility] Visit from 11/03/2021 in [facility] Visit from 06/28/2020 in [facility]  PHQ-2 Total Score 0 3 6 4  0  PHQ-9 Total Score 0 16 24 17  --      [place] Office Visit from 02/02/2022 in [facility] ED from 05/02/2021 in [facility]  ED from 01/12/2021 in [facility] Error: Q2 is Yes, you must answer 3, 4, and 5 No Risk No Risk        Assessment and Plan: Amanda [name] is a 26 year old female, engaged, lives in [place], [place] mom, has a history of MDD, GAD was evaluated by telemedicine today.  Patient is currently improving on the Lexapro.  Discussed plan as noted below.  Plan  GAD-improving Lexapro 5 mg p.o. daily We will consider increasing the dosage in future sessions. Hydroxyzine 25-50 mg at bedtime as needed-has not used it. Xanax 0.25-0.5 mg at bedtime as needed for severe anxiety.  MDD-improving Continue Lexapro 5 mg p.o. daily Patient has upcoming appointment with psychotherapist-motivated to keep it  Pending labs-TSH-encouraged to get it done.  Follow-up in clinic in 3 to 4 weeks or sooner if needed.  Collaboration of Care: Collaboration of Care: Referral or follow-up with counselor/therapist AEB encouraged to follow up with therapist.  Has  appointment with our therapist at our practice.  Patient/Guardian was advised Release of Information must be obtained prior to any record release in order to collaborate their care with an outside provider. Patient/Guardian was advised if they have not already done so to contact the registration department to sign all necessary forms in order for [place] to release information regarding their care.   Consent: Patient/Guardian gives verbal consent for treatment and assignment  of benefits for services provided during this visit. Patient/Guardian expressed understanding and agreed to proceed.   This note was generated in part or whole with voice recognition software. Voice recognition is usually quite accurate but there are transcription errors that can and very often do occur. I apologize for any typographical errors that were not detected and corrected.      Ursula Alert, MD 02/14/2022, 12:55 PM

## 2022-02-13 NOTE — Progress Notes (Signed)
Medication Samples have been provided to the patient.  Drug name: Hinda Kehr       Strength: 100 mg        Qty: 4   LOT: [id number]  Exp.Date: 7/23  Dosing instructions: as needed  The patient has been instructed regarding the correct time, dose, and frequency of taking this medication, including desired effects and most common side effects.   Venetia Night 3:00 PM 02/13/2022

## 2022-02-13 NOTE — Patient Instructions (Signed)
Start gabapentin 300mg  at bedtime.  If no improvement in 4 to 6 weeks, contact me and we can increase dose Take Ubralvy earliest onset of migraine.  May repeat in 2 hours.  Maximum 2 tablets in 24 hours. Limit use of pain relievers to no more than 2 days out of week to prevent risk of rebound or medication-overuse headache. Keep headache diary Follow up 4 months.

## 2022-02-14 ENCOUNTER — Encounter: Payer: Self-pay | Admitting: Family Medicine

## 2022-02-14 ENCOUNTER — Encounter: Payer: Self-pay | Admitting: Licensed Practical Nurse

## 2022-02-14 ENCOUNTER — Ambulatory Visit (INDEPENDENT_AMBULATORY_CARE_PROVIDER_SITE_OTHER): Payer: Medicaid Other | Admitting: Family Medicine

## 2022-02-14 VITALS — BP 100/64 | HR 122 | Ht 64.0 in

## 2022-02-14 DIAGNOSIS — M9901 Segmental and somatic dysfunction of cervical region: Secondary | ICD-10-CM

## 2022-02-14 DIAGNOSIS — G4486 Cervicogenic headache: Secondary | ICD-10-CM

## 2022-02-14 DIAGNOSIS — M9902 Segmental and somatic dysfunction of thoracic region: Secondary | ICD-10-CM

## 2022-02-14 DIAGNOSIS — M9908 Segmental and somatic dysfunction of rib cage: Secondary | ICD-10-CM

## 2022-02-14 NOTE — Patient Instructions (Signed)
You must buy yourself a gift Get back on routine See me in 6-8 weeks

## 2022-02-14 NOTE — Assessment & Plan Note (Signed)
Patient does have a cervicogenic headache.  It is potentially getting a migraine.  Responded extremely well to osteopathic manipulation.  Discussed icing regimen and home exercises.  Follow-up again in 6 to 8 weeks

## 2022-02-15 ENCOUNTER — Ambulatory Visit: Payer: Medicaid Other | Admitting: Obstetrics & Gynecology

## 2022-02-15 ENCOUNTER — Encounter: Payer: Self-pay | Admitting: Licensed Practical Nurse

## 2022-02-15 LAB — CYTOLOGY - PAP
Chlamydia: NEGATIVE
Comment: NEGATIVE
Comment: NEGATIVE
Comment: NORMAL
Diagnosis: NEGATIVE
Neisseria Gonorrhea: NEGATIVE
Trichomonas: NEGATIVE

## 2022-02-16 ENCOUNTER — Other Ambulatory Visit: Payer: Self-pay

## 2022-02-16 ENCOUNTER — Ambulatory Visit (INDEPENDENT_AMBULATORY_CARE_PROVIDER_SITE_OTHER): Payer: Medicaid Other | Admitting: Licensed Clinical Social Worker

## 2022-02-16 DIAGNOSIS — F411 Generalized anxiety disorder: Secondary | ICD-10-CM

## 2022-02-17 NOTE — Plan of Care (Signed)
Drafted tx plan with pt input ?
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
## 2022-02-24 ENCOUNTER — Other Ambulatory Visit: Payer: Self-pay | Admitting: Psychiatry

## 2022-02-24 DIAGNOSIS — F411 Generalized anxiety disorder: Secondary | ICD-10-CM

## 2022-02-24 DIAGNOSIS — F330 Major depressive disorder, recurrent, mild: Secondary | ICD-10-CM

## 2022-02-28 ENCOUNTER — Ambulatory Visit (INDEPENDENT_AMBULATORY_CARE_PROVIDER_SITE_OTHER): Payer: Medicaid Other | Admitting: Physician Assistant

## 2022-02-28 ENCOUNTER — Other Ambulatory Visit: Payer: Self-pay

## 2022-02-28 ENCOUNTER — Encounter: Payer: Self-pay | Admitting: Physician Assistant

## 2022-02-28 VITALS — BP 106/69 | HR 91 | Temp 98.6°F | Resp 14 | Ht 64.0 in | Wt 154.3 lb

## 2022-02-28 DIAGNOSIS — R0981 Nasal congestion: Secondary | ICD-10-CM

## 2022-02-28 DIAGNOSIS — J029 Acute pharyngitis, unspecified: Secondary | ICD-10-CM

## 2022-02-28 DIAGNOSIS — F322 Major depressive disorder, single episode, severe without psychotic features: Secondary | ICD-10-CM

## 2022-02-28 DIAGNOSIS — T22111A Burn of first degree of right forearm, initial encounter: Secondary | ICD-10-CM | POA: Diagnosis not present

## 2022-02-28 DIAGNOSIS — M79622 Pain in left upper arm: Secondary | ICD-10-CM

## 2022-02-28 LAB — POCT RAPID STREP A (OFFICE): Rapid Strep A Screen: NEGATIVE

## 2022-02-28 MED ORDER — AZELASTINE HCL 0.1 % NA SOLN: 2.0000 | Freq: Two times a day (BID) | NASAL | 3 refills | Status: DC
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
## 2022-03-01 ENCOUNTER — Ambulatory Visit: Payer: Medicaid Other | Admitting: Neurology

## 2022-03-05 ENCOUNTER — Encounter: Payer: Self-pay | Admitting: Physician Assistant

## 2022-03-06 ENCOUNTER — Encounter: Payer: Self-pay | Admitting: Psychiatry

## 2022-03-06 ENCOUNTER — Telehealth (INDEPENDENT_AMBULATORY_CARE_PROVIDER_SITE_OTHER): Payer: Medicaid Other | Admitting: Psychiatry

## 2022-03-06 ENCOUNTER — Other Ambulatory Visit: Payer: Self-pay

## 2022-03-06 DIAGNOSIS — F411 Generalized anxiety disorder: Secondary | ICD-10-CM | POA: Diagnosis not present

## 2022-03-06 DIAGNOSIS — F3342 Major depressive disorder, recurrent, in full remission: Secondary | ICD-10-CM

## 2022-03-06 DIAGNOSIS — F3341 Major depressive disorder, recurrent, in partial remission: Secondary | ICD-10-CM | POA: Insufficient documentation

## 2022-03-06 MED ORDER — ESCITALOPRAM OXALATE 10 MG PO TABS: 10.0000 mg | ORAL_TABLET | Freq: Every day | ORAL | 0 refills | Status: DC

## 2022-03-06 NOTE — Progress Notes (Signed)
Virtual Visit via Video Note ? ?I connected with Amanda [name] on 03/06/22 at  4:00 PM EDT by a video enabled telemedicine application and verified that I am speaking with the correct person using two identifiers. ? ?Location ?Provider Location : [facility] ?Patient Location : Home ? ?Participants: Patient , Provider ? ?  ?I discussed the limitations of evaluation and management by telemedicine and the availability of in person appointments. The patient expressed understanding and agreed to proceed. ? ?  ?I discussed the assessment and treatment plan with the patient. The patient was provided an opportunity to ask questions and all were answered. The patient agreed with the plan and demonstrated an understanding of the instructions. ?  ?The patient was advised to call back or seek an in-person evaluation if the symptoms worsen or if the condition fails to improve as anticipated. ? ? ?BH MD OP Progress Note ? ?03/06/2022 4:26 PM ?Amanda [name]  ?MRN:  [id number] ? ?Chief Complaint:  ?Chief Complaint  ?Patient presents with  ? Follow-up: 26-year-old female with history of GAD, MDD, presented for medication management.  ? ?HPI: Amanda [name] is a 26-year-old female, engaged, stay-at-home mom, lives in [place], has a history of GAD, MDD in full remission was evaluated by telemedicine today. ? ?Patient reports she had recent psychosocial stressors and that triggered some mood lability, rage, irritability for a couple of days.  However later on she was able to cope better. ? ?Patient continues to have anxiety symptoms, worrying about things in general, although Lexapro seems to be helpful.  She is interested in dosage increase. ? ?Patient also has started psychotherapy sessions and is motivated to stay in therapy. ? ?Reports sleep as restless due to her children needing her help at night.  Does have a headache on and off. ? ?Reports she has been cutting back on her calorie intake, wants to lose weight. ? ?Denies suicidality,  homicidality or perceptual disturbances. ? ?Patient denies any other concerns today. ? ?Visit Diagnosis:  ?  ICD-10-CM   ?1. GAD (generalized anxiety disorder)  F41.1 escitalopram (LEXAPRO) 10 MG tablet  ?  ?2. MDD (major depressive disorder), recurrent, in full remission (HCC)  F33.42 escitalopram (LEXAPRO) 10 MG tablet  ?  ? ? ?Past Psychiatric History: Reviewed past psychiatric history from progress note on 02/02/2022.  Past trials of Zoloft-did not work, Effexor-made her suicidal. ? ?Past Medical History:  ?Past Medical History:  ?Diagnosis Date  ? BRCA negative 09/2020  ? MyRisk neg except AXIN2 VUS  ? Family history of adverse reaction to anesthesia   ? heart condition  ? Family history of breast cancer 09/2020  ? IBIS=15.7%/riskscore=14.7%  ? Family history of pancreatic cancer   ? GERD (gastroesophageal reflux disease)   ? Migraine   ?  ?Past Surgical History:  ?Procedure Laterality Date  ? NO PAST SURGERIES    ? WISDOM TOOTH EXTRACTION    ? ? ?Family Psychiatric History: Reviewed family psychiatric history from progress note on 02/02/2022. ? ?Family History:  ?Family History  ?Problem Relation Age of Onset  ? Migraines Mother   ? Skin cancer Father   ? Asthma Maternal Grandmother   ? Hypertension Maternal Grandmother   ? Thyroid disease Maternal Grandmother   ? Hypertension Maternal Grandfather   ? Pancreatic cancer Maternal Grandfather 70  ? Colon cancer Maternal Grandfather 60  ? Hypertension Paternal Grandmother   ? Diabetes Paternal Grandmother   ? Hypertension Paternal Grandfather   ? Diabetes Paternal Grandfather   ?   Colon cancer Paternal Grandfather 80  ? Thyroid disease Maternal Aunt   ? Breast cancer Maternal Aunt 45  ? Allergies Brother   ? ? ?Social History: Reviewed social history from progress note on 02/02/2022. ?Social History  ? ?Socioeconomic History  ? Marital status: Significant Other  ?  Spouse name: Amanda [name]  ? Number of children: 2  ? Years of education: Not on file  ? Highest  education level: Associate degree: occupational, technical, or vocational program  ?Occupational History  ? Occupation: Massage Therapy  ?Tobacco Use  ? Smoking status: Never  ? Smokeless tobacco: Never  ?Vaping Use  ? Vaping Use: Never used  ?Substance and Sexual Activity  ? Alcohol use: Never  ? Drug use: No  ? Sexual activity: Yes  ?  Partners: Male  ?  Birth control/protection: None  ?Other Topics Concern  ? Not on file  ?Social History Narrative  ? ** Merged History Encounter **  ?    ? One story home ?Right-handed ?Caffeine: occasional coffee  ? ?Social Determinants of Health  ? ?Financial Resource Strain: Not on file  ?Food Insecurity: Not on file  ?Transportation Needs: Not on file  ?Physical Activity: Not on file  ?Stress: Stress Concern Present  ? Feeling of Stress : Rather much  ?Social Connections: Not on file  ? ? ?Allergies:  ?Allergies  ?Allergen Reactions  ? Imitrex [Sumatriptan] Swelling  ?  Facial swelling and burning lips  ? Penicillins Hives  ?  Did it involve swelling of the face/tongue/throat, SOB, or low BP? No ?Did it involve sudden or severe rash/hives, skin peeling, or any reaction on the inside of your mouth or nose? Yes ?Did you need to seek medical attention at a hospital or doctor's office? Yes ?When did it last happen?      childhood allergy ?If all above answers are ?NO?, may proceed with cephalosporin use.  ? ? ?Metabolic Disorder Labs: ?Lab Results  ?Component Value Date  ? HGBA1C 5.3 01/07/2015  ? ?No results found for: PROLACTIN ?Lab Results  ?Component Value Date  ? CHOL 135 06/28/2020  ? TRIG 54 06/28/2020  ? HDL 54 06/28/2020  ? LDLCALC 69 06/28/2020  ? ?Lab Results  ?Component Value Date  ? TSH 0.749 06/28/2020  ? TSH 1.06 03/28/2018  ? ? ?Therapeutic Level Labs: ?No results found for: LITHIUM ?No results found for: VALPROATE ?No components found for:  CBMZ ? ?Current Medications: ?Current Outpatient Medications  ?Medication Sig Dispense Refill  ? escitalopram (LEXAPRO) 10 MG  tablet Take 1 tablet (10 mg total) by mouth daily. Dose change 90 tablet 0  ? azelastine (ASTELIN) 0.1 % nasal spray Place 2 sprays into both nostrils 2 (two) times daily. Use in each nostril as directed 30 mL 3  ? fluticasone (FLONASE) 50 MCG/ACT nasal spray Place 2 sprays into both nostrils daily.    ? gabapentin (NEURONTIN) 300 MG capsule Take 1 capsule (300 mg total) by mouth at bedtime. 30 capsule 5  ? hydrOXYzine (VISTARIL) 25 MG capsule TAKE 1-2 CAPSULES (25-50 MG TOTAL) BY MOUTH AT BEDTIME AS NEEDED. FOR ANXIETY AND SLEEP 180 capsule 0  ? NURTEC 75 MG TBDP DISSOLVE 1 TABLET ON THE TONGUE EVERY OTHER DAY 16 tablet 2  ? valACYclovir (VALTREX) 1000 MG tablet Take 1 tablet (1,000 mg total) by mouth daily. 90 tablet 2  ? valACYclovir (VALTREX) 500 MG tablet Take 500 mg by mouth daily.    ? ?No current facility-administered medications for this   visit.  ? ? ? ?Musculoskeletal: ?Strength & Muscle Tone:  UTA ?Gait & Station:  Seated ?Patient leans: N/A ? ?Psychiatric Specialty Exam: ?Review of Systems  ?Neurological:  Positive for headaches.  ?Psychiatric/Behavioral:  The patient is nervous/anxious.   ?All other systems reviewed and are negative.  ?There were no vitals taken for this visit.There is no height or weight on file to calculate BMI.  ?General Appearance: Casual  ?Eye Contact:  Fair  ?Speech:  Clear and Coherent  ?Volume:  Normal  ?Mood:  Anxious  ?Affect:  Congruent  ?Thought Process:  Goal Directed and Descriptions of Associations: Intact  ?Orientation:  Full (Time, Place, and Person)  ?Thought Content: Logical   ?Suicidal Thoughts:  No  ?Homicidal Thoughts:  No  ?Memory:  Immediate;   Fair ?Recent;   Fair ?Remote;   Fair  ?Judgement:  Fair  ?Insight:  Fair  ?Psychomotor Activity:  Normal  ?Concentration:  Concentration: Fair and Attention Span: Fair  ?Recall:  Fair  ?Fund of Knowledge: Fair  ?Language: Fair  ?Akathisia:  No  ?Handed:  Right  ?AIMS (if indicated): done  ?Assets:  Communication  Skills ?Desire for Improvement ?Social Support ?Talents/Skills  ?ADL's:  Intact  ?Cognition: WNL  ?Sleep:   restless  ? ?Screenings: ?GAD-7   ? ?Flowsheet Row Video Visit from 03/06/2022 in [facility]

## 2022-03-08 ENCOUNTER — Ambulatory Visit (INDEPENDENT_AMBULATORY_CARE_PROVIDER_SITE_OTHER): Payer: Medicaid Other | Admitting: Physician Assistant

## 2022-03-08 ENCOUNTER — Encounter: Payer: Self-pay | Admitting: Physician Assistant

## 2022-03-08 ENCOUNTER — Other Ambulatory Visit: Payer: Self-pay

## 2022-03-08 VITALS — BP 99/64 | HR 90 | Temp 100.0°F | Resp 16 | Wt 157.0 lb

## 2022-03-08 DIAGNOSIS — R590 Localized enlarged lymph nodes: Secondary | ICD-10-CM | POA: Insufficient documentation

## 2022-03-08 NOTE — Assessment & Plan Note (Addendum)
R mobile but enlarged singular inguinal lymph node, reactive ?May be associated with ingrown hair?  ?Advised warm compresses and tylenol if pain . ?

## 2022-03-08 NOTE — Progress Notes (Signed)
?  ? ?I,Sulibeya S Dimas,acting as a Education administrator for [employer], PA-C.,have documented all relevant documentation on the behalf of [name], PA-C,as directed by  [name], PA-C while in the presence of [name], PA-C. ? ? ?Established patient visit ? ? ?Patient: Amanda [name]   DOB: December 05, 1996   26 y.o. Female  MRN: [id number] ?Visit Date: 03/08/2022 ? ?Today's healthcare provider: [name], PA-C  ? ?Chief Complaint  ?Patient presents with  ? Abdominal Pain  ? ?Subjective  ?  ?HPI  ?Amanda [name] is a 26 y/o female who presents today with a painful swollen bump in her right groin with some overlying redness. Denies fevers, chills, no HSV outbreaks. Reports was larger yesterday but today is smaller. Denies urinary symptoms.  ? ?Medications: ?Outpatient Medications Prior to Visit  ?Medication Sig  ? azelastine (ASTELIN) 0.1 % nasal spray Place 2 sprays into both nostrils 2 (two) times daily. Use in each nostril as directed  ? escitalopram (LEXAPRO) 10 MG tablet Take 1 tablet (10 mg total) by mouth daily. Dose change  ? gabapentin (NEURONTIN) 300 MG capsule Take 1 capsule (300 mg total) by mouth at bedtime.  ? hydrOXYzine (VISTARIL) 25 MG capsule TAKE 1-2 CAPSULES (25-50 MG TOTAL) BY MOUTH AT BEDTIME AS NEEDED. FOR ANXIETY AND SLEEP  ? valACYclovir (VALTREX) 1000 MG tablet Take 1 tablet (1,000 mg total) by mouth daily.  ? fluticasone (FLONASE) 50 MCG/ACT nasal spray Place 2 sprays into both nostrils daily. (Patient not taking: Reported on 03/08/2022)  ? NURTEC 75 MG TBDP DISSOLVE 1 TABLET ON THE TONGUE EVERY OTHER DAY (Patient not taking: Reported on 03/08/2022)  ? valACYclovir (VALTREX) 500 MG tablet Take 500 mg by mouth daily.  ? ?No facility-administered medications prior to visit.  ? ? ?Review of Systems  ?Constitutional:  Negative for appetite change and fatigue.  ?Gastrointestinal:  Positive for abdominal pain. Negative for blood in stool, constipation, diarrhea, nausea and vomiting.  ?Genitourinary:   Negative for dysuria and hematuria.  ? ?  Objective  ?  ?BP 99/64 (BP Location: Left Arm, Patient Position: Sitting, Cuff Size: Large)   Pulse 90   Temp 100 ?F (37.8 ?C) (Oral)   Resp 16   Wt 157 lb (71.2 kg)   LMP 02/15/2022 (Approximate)   SpO2 99%   BMI 26.95 kg/m?  ? ? ?Physical Exam ?Constitutional:   ?   General: She is awake.  ?   Appearance: She is well-developed.  ?HENT:  ?   Head: Normocephalic.  ?Eyes:  ?   Conjunctiva/sclera: Conjunctivae normal.  ?Cardiovascular:  ?   Rate and Rhythm: Normal rate.  ?Pulmonary:  ?   Effort: Pulmonary effort is normal.  ?Abdominal:  ?   Comments: R groin with a 2-3 cm mobile, soft inguinal lymph node with some slight overlying erythema.   ?Skin: ?   General: Skin is warm.  ?Neurological:  ?   Mental Status: She is alert and oriented to person, place, and time.  ?Psychiatric:     ?   Attention and Perception: Attention normal.     ?   Mood and Affect: Mood normal.     ?   Speech: Speech normal.     ?   Behavior: Behavior is cooperative.  ?  ? ?No results found for any visits on 03/08/22. ? Assessment & Plan  ?  ? ?Problem List Items Addressed This Visit   ? ?  ? Immune and Lymphatic  ? Inguinal lymphadenopathy - Primary  ?  R mobile but enlarged singular inguinal lymph node, reactive ?May be associated with ingrown hair?  ?Advised warm compresses and tylenol if pain . ?  ?  ? ?I, [name], PA-C have reviewed all documentation for this visit. The documentation on  03/08/2022 for the exam, diagnosis, procedures, and orders are all accurate and complete. ? ?[name], PA-C ?[facility] ?[id number] ?[place], [place], [address] ?Office: [phone] ?Fax: [phone]  ? ?[facility] ?

## 2022-03-09 LAB — TSH+FREE T4
Free T4: 1.15 ng/dL (ref 0.82–1.77)
TSH: 1.6 u[IU]/mL (ref 0.450–4.500)

## 2022-03-17 ENCOUNTER — Encounter: Payer: Self-pay | Admitting: Physician Assistant

## 2022-03-21 ENCOUNTER — Ambulatory Visit
Admission: RE | Admit: 2022-03-21 | Discharge: 2022-03-21 | Disposition: A | Discharge status: Home / Self Care | Payer: Medicaid Other | Source: Ambulatory Visit | Attending: Physician Assistant | Admitting: Physician Assistant

## 2022-03-21 VITALS — BP 113/76 | HR 89 | Temp 98.3°F | Resp 20

## 2022-03-21 DIAGNOSIS — J302 Other seasonal allergic rhinitis: Secondary | ICD-10-CM

## 2022-03-21 NOTE — Discharge Instructions (Addendum)
Take Xyzal and use Flonase nasal spray daily as directed.  Follow up with your primary care provider if your symptoms are not improving.   ? ? ? ?

## 2022-03-21 NOTE — ED Triage Notes (Signed)
Pt here with cough, congestion, and chest congestion x 4 days.  ?

## 2022-03-21 NOTE — ED Provider Notes (Signed)
?[facility] ? ? ? ?CSN: [id number] ?Arrival date & time: 03/21/22  1109 ? ? ?  ? ?History   ?Chief Complaint ?Chief Complaint  ?Patient presents with  ? Nasal Congestion  ? Cough  ? Chest Congestion  ? Hoarse  ? ? ?HPI ?Amanda [name] is a 26 y.o. female.  Presents with 4-day history of nasal congestion, postnasal drip, runny nose, cough.  She denies fever, chills, rash, sore throat, shortness of breath, or other symptoms.  Treatment attempted at home with Flonase, Zyrtec, OTC sinus medication.  Patient was seen by her PCP on 03/08/2022; diagnosed with inguinal lymphadenopathy.  She was seen by her PCP on 02/28/2022; diagnosed with pharyngitis, nasal congestion, superficial burn of right forearm, axillary tenderness, depression.  Her medical history also includes migraine headaches, GERD, chronic sinusitis, anxiety. ? ?The history is provided by the patient and medical records.  ? ?Past Medical History:  ?Diagnosis Date  ? BRCA negative 09/2020  ? MyRisk neg except AXIN2 VUS  ? Family history of adverse reaction to anesthesia   ? heart condition  ? Family history of breast cancer 09/2020  ? IBIS=15.7%/riskscore=14.7%  ? Family history of pancreatic cancer   ? GERD (gastroesophageal reflux disease)   ? Migraine   ? ? ?Patient Active Problem List  ? Diagnosis Date Noted  ? Inguinal lymphadenopathy 03/08/2022  ? MDD (major depressive disorder), recurrent, in partial remission ([place]) 03/06/2022  ? MDD (major depressive disorder), recurrent episode, mild ([place]) 02/02/2022  ? Intractable migraine with aura with status migrainosus 01/31/2022  ? Depression, major, single episode, severe ([place]) 01/31/2022  ? Cervicogenic headache 12/15/2021  ? Somatic dysfunction of cervical region 12/15/2021  ? Neck muscle spasm 11/03/2021  ? GAD (generalized anxiety disorder) 11/03/2021  ? History of affective disorder 11/03/2021  ? CIN I (cervical intraepithelial neoplasia I) 02/14/2021  ? Genital herpes simplex type 2 02/10/2021  ?  Vitamin D deficiency 06/28/2020  ? History of chronic sinusitis 06/28/2020  ? Monochorionic diamniotic twin gestation 05/20/2019  ? Chronic migraine without aura 02/11/2017  ? Vision loss of left eye 01/17/2017  ? Acne 11/18/2014  ? ? ?Past Surgical History:  ?Procedure Laterality Date  ? NO PAST SURGERIES    ? WISDOM TOOTH EXTRACTION    ? ? ?OB History   ? ? Gravida  ?1  ? Para  ?1  ? Term  ?0  ? Preterm  ?1  ? AB  ?0  ? Living  ?2  ?  ? ? SAB  ?0  ? IAB  ?0  ? Ectopic  ?0  ? Multiple  ?1  ? Live Births  ?2  ?   ?  ?  ? ? ? ?Home Medications   ? ?Prior to Admission medications   ?Medication Sig Start Date End Date Taking? Authorizing Provider  ?azelastine (ASTELIN) 0.1 % nasal spray Place 2 sprays into both nostrils 2 (two) times daily. Use in each nostril as directed 02/28/22   Drubel, Ria Comment, PA-C  ?escitalopram (LEXAPRO) 10 MG tablet Take 1 tablet (10 mg total) by mouth daily. Dose change 03/06/22   Ursula Alert, MD  ?fluticasone (FLONASE) 50 MCG/ACT nasal spray Place 2 sprays into both nostrils daily. ?Patient not taking: Reported on 03/08/2022 11/07/21   [provider]  ?gabapentin (NEURONTIN) 300 MG capsule Take 1 capsule (300 mg total) by mouth at bedtime. 02/13/22   Pieter Partridge, DO  ?hydrOXYzine (VISTARIL) 25 MG capsule TAKE 1-2 CAPSULES (25-50 MG TOTAL) BY  MOUTH AT BEDTIME AS NEEDED. FOR ANXIETY AND SLEEP 02/24/22   Ursula Alert, MD  ?NURTEC 75 MG TBDP DISSOLVE 1 TABLET ON THE TONGUE EVERY OTHER DAY ?Patient not taking: Reported on 03/08/2022 11/07/21   Melvenia Beam, MD  ?valACYclovir (VALTREX) 1000 MG tablet Take 1 tablet (1,000 mg total) by mouth daily. 04/21/72   Copland, [name] Evener, PA-C  ?valACYclovir (VALTREX) 500 MG tablet Take 500 mg by mouth daily. 11/03/21   [provider]  ?dicyclomine (BENTYL) 20 MG tablet Take 1 tablet (20 mg total) by mouth 2 (two) times daily. ?Patient not taking: No sig reported 10/11/20 01/12/21  Faustino Congress, NP  ?norethindrone (MICRONOR) 0.35  MG tablet Take 1 tablet (0.35 mg total) by mouth daily. ?Patient not taking: No sig reported 09/28/20 01/12/21  Gae Dry, MD  ?omeprazole (PRILOSEC) 40 MG capsule Take 1 capsule (40 mg total) by mouth in the morning and at bedtime. ?Patient not taking: Reported on 01/04/2021 10/25/20 01/12/21  Jonathon Bellows, MD  ? ? ?Family History ?Family History  ?Problem Relation Age of Onset  ? Migraines Mother   ? Skin cancer Father   ? Asthma Maternal Grandmother   ? Hypertension Maternal Grandmother   ? Thyroid disease Maternal Grandmother   ? Hypertension Maternal Grandfather   ? Pancreatic cancer Maternal Grandfather 32  ? Colon cancer Maternal Grandfather 71  ? Hypertension Paternal Grandmother   ? Diabetes Paternal Grandmother   ? Hypertension Paternal Grandfather   ? Diabetes Paternal Grandfather   ? Colon cancer Paternal Grandfather 35  ? Thyroid disease Maternal Aunt   ? Breast cancer Maternal Aunt 64  ? Allergies Brother   ? ? ?Social History ?Social History  ? ?Tobacco Use  ? Smoking status: Never  ? Smokeless tobacco: Never  ?Vaping Use  ? Vaping Use: Never used  ?Substance Use Topics  ? Alcohol use: Never  ? Drug use: No  ? ? ? ?Allergies   ?Imitrex [sumatriptan] and Penicillins ? ? ?Review of Systems ?Review of Systems  ?Constitutional:  Negative for chills and fever.  ?HENT:  Positive for congestion, postnasal drip and rhinorrhea. Negative for ear pain and sore throat.   ?Respiratory:  Positive for cough. Negative for shortness of breath.   ?Gastrointestinal:  Negative for diarrhea and vomiting.  ?Skin:  Negative for color change and rash.  ?All other systems reviewed and are negative. ? ? ?Physical Exam ?Triage Vital Signs ?ED Triage Vitals  ?Enc Vitals Group  ?   BP   ?   Pulse   ?   Resp   ?   Temp   ?   Temp src   ?   SpO2   ?   Weight   ?   Height   ?   Head Circumference   ?   Peak Flow   ?   Pain Score   ?   Pain Loc   ?   Pain Edu?   ?   Excl. in [place]?   ? ?No data found. ? ?Updated Vital Signs ?BP 113/76    Pulse 89   Temp 98.3 ?F (36.8 ?C)   Resp 20   LMP 03/20/2022 (Approximate)   SpO2 98%  ? ?Visual Acuity ?Right Eye Distance:   ?Left Eye Distance:   ?Bilateral Distance:   ? ?Right Eye Near:   ?Left Eye Near:    ?Bilateral Near:    ? ?Physical Exam ?Vitals and nursing note reviewed.  ?Constitutional:   ?  General: She is not in acute distress. ?   Appearance: Normal appearance. She is well-developed. She is not ill-appearing.  ?HENT:  ?   Right Ear: Tympanic membrane normal.  ?   Left Ear: Tympanic membrane normal.  ?   Nose: Nose normal.  ?   Mouth/Throat:  ?   Mouth: Mucous membranes are moist.  ?   Pharynx: Oropharynx is clear.  ?Cardiovascular:  ?   Rate and Rhythm: Normal rate and regular rhythm.  ?   Heart sounds: Normal heart sounds.  ?Pulmonary:  ?   Effort: Pulmonary effort is normal. No respiratory distress.  ?   Breath sounds: Normal breath sounds.  ?Musculoskeletal:  ?   Cervical back: Neck supple.  ?Skin: ?   General: Skin is warm and dry.  ?Neurological:  ?   Mental Status: She is alert.  ?Psychiatric:     ?   Mood and Affect: Mood normal.     ?   Behavior: Behavior normal.  ? ? ? ?UC Treatments / Results  ?Labs ?(all labs ordered are listed, but only abnormal results are displayed) ?Labs Reviewed - No data to display ? ?EKG ? ? ?Radiology ?No results found. ? ?Procedures ?Procedures (including critical care time) ? ?Medications Ordered in UC ?Medications - No data to display ? ?Initial Impression / Assessment and Plan / UC Course  ?I have reviewed the triage vital signs and the nursing notes. ? ?Pertinent labs & imaging results that were available during my care of the patient were reviewed by me and considered in my medical decision making (see chart for details). ? ?  ?Seasonal allergies.  Instructed patient to take Xyzal instead of Zyrtec and continue using Flonase nasal spray daily.  Education provided on allergic rhinitis.  Instructed patient to follow up with her PCP if her symptoms are  not improving.  She agrees to plan of care.  ? ? ?Final Clinical Impressions(s) / UC Diagnoses  ? ?Final diagnoses:  ?Seasonal allergies  ? ? ? ?Discharge Instructions   ? ?  ?Take Xyzal and use Flonase nasal

## 2022-03-27 NOTE — Progress Notes (Signed)
?Charlann Boxer D.O. ?[facility] ?[place] ?Phone: [phone] ?Subjective:   ?I, Judy Pimple, am serving as a scribe for Dr. Hulan Saas. ? ?I'm seeing this patient by the request  of:  [name], PA-C ? ?CC: back and neck pain  ? ?NTZ:[id number]  ?Amanda [name] is a 26 y.o. female coming in with complaint of back and neck pain. OMT 02/14/2022. Patient states that she is pretty tight in his neck lately, states hip are okay.  ? ?Medications patient has been prescribed: None ? ?Taking: ? ? ?  ? ? ?Reviewed prior external information including notes and imaging from previsou exam, outside providers and external EMR if available.  ? ?As well as notes that were available from care everywhere and other healthcare systems. ? ?Past medical history, social, surgical and family history all reviewed in electronic medical record.  No pertanent information unless stated regarding to the chief complaint.  ? ?Past Medical History:  ?Diagnosis Date  ? BRCA negative 09/2020  ? MyRisk neg except AXIN2 VUS  ? Family history of adverse reaction to anesthesia   ? heart condition  ? Family history of breast cancer 09/2020  ? IBIS=15.7%/riskscore=14.7%  ? Family history of pancreatic cancer   ? GERD (gastroesophageal reflux disease)   ? Migraine   ?  ?Allergies  ?Allergen Reactions  ? Imitrex [Sumatriptan] Swelling  ?  Facial swelling and burning lips  ? Penicillins Hives  ?  Did it involve swelling of the face/tongue/throat, SOB, or low BP? No ?Did it involve sudden or severe rash/hives, skin peeling, or any reaction on the inside of your mouth or nose? Yes ?Did you need to seek medical attention at a hospital or doctor's office? Yes ?When did it last happen?      childhood allergy ?If all above answers are ?NO?, may proceed with cephalosporin use.  ? ? ? ?Review of Systems: ? No , visual changes, nausea, vomiting, diarrhea, constipation, dizziness, abdominal pain, skin rash, fevers,  chills, night sweats, weight loss, swollen lymph nodes, body aches, joint swelling, chest pain, shortness of breath, mood changes. POSITIVE muscle aches, headache ? ?Objective  ?Blood pressure 110/70, pulse (!) 108, height 5' 4"  (1.626 m), weight 157 lb (71.2 kg), last menstrual period 03/20/2022, SpO2 99 %. ?  ?General: No apparent distress alert and oriented x3 mood and affect normal, dressed appropriately.  ?HEENT: Pupils equal, extraocular movements intact  ?Respiratory: Patient's speak in full sentences and does not appear short of breath  ?Cardiovascular: No lower extremity edema, non tender, no erythema  ?Significant tightness noted in the cervical spine.  Patient also has increasing tightness noted in the paraspinal musculature of the mid back noted. ? ?Osteopathic findings ? ?C2 flexed rotated and side bent right ?C5 flexed rotated and side bent left ?T3 extended rotated and side bent right inhaled rib ?T7 extended rotated and side bent left ?L2 flexed rotated and side bent right ?Sacrum right on right ? ? ?  ?Assessment and Plan: ? ?Cervicogenic headache ?Chronic, with mild exacerbation.  Recently had an illness that likely did contribute to some of the discomfort as well.  Discussed which activities to do which ones to avoid, increase activity slowly.  Follow-up again in 6 to 8 weeks. ?  ? ?Nonallopathic problems ? ?Decision today to treat with OMT was based on Physical Exam ? ?After verbal consent patient was treated with HVLA, ME, FPR techniques in cervical, rib, thoracic, lumbar, and sacral  areas ? ?Patient tolerated the procedure well with improvement in symptoms ? ?Patient given exercises, stretches and lifestyle modifications ? ?See medications in patient instructions if given ? ?Patient will follow up in 4-8 weeks ? ?  ? ? ?The above documentation has been reviewed and is accurate and complete [name], DO ? ? ? ?  ? ? Note: This dictation was prepared with Dragon dictation along with  smaller phrase technology. Any transcriptional errors that result from this process are unintentional.    ?  ?  ? ?

## 2022-03-28 ENCOUNTER — Ambulatory Visit: Payer: Medicaid Other | Admitting: Family Medicine

## 2022-03-28 VITALS — BP 110/70 | HR 108 | Ht 64.0 in | Wt 157.0 lb

## 2022-03-28 DIAGNOSIS — G4486 Cervicogenic headache: Secondary | ICD-10-CM

## 2022-03-28 DIAGNOSIS — M9903 Segmental and somatic dysfunction of lumbar region: Secondary | ICD-10-CM

## 2022-03-28 DIAGNOSIS — M9904 Segmental and somatic dysfunction of sacral region: Secondary | ICD-10-CM | POA: Diagnosis not present

## 2022-03-28 DIAGNOSIS — M9908 Segmental and somatic dysfunction of rib cage: Secondary | ICD-10-CM | POA: Diagnosis not present

## 2022-03-28 DIAGNOSIS — M9901 Segmental and somatic dysfunction of cervical region: Secondary | ICD-10-CM | POA: Diagnosis not present

## 2022-03-28 DIAGNOSIS — M9902 Segmental and somatic dysfunction of thoracic region: Secondary | ICD-10-CM

## 2022-03-28 NOTE — Patient Instructions (Addendum)
Good to see you  ?Good luck with the petri-dishes ?Stretch after holding the little one ?Follow up in 6-8 weeks  ?

## 2022-03-28 NOTE — Assessment & Plan Note (Signed)
Chronic, with mild exacerbation.  Recently had an illness that likely did contribute to some of the discomfort as well.  Discussed which activities to do which ones to avoid, increase activity slowly.  Follow-up again in 6 to 8 weeks. ?

## 2022-03-30 ENCOUNTER — Ambulatory Visit (INDEPENDENT_AMBULATORY_CARE_PROVIDER_SITE_OTHER): Payer: Medicaid Other | Admitting: Licensed Clinical Social Worker

## 2022-03-30 DIAGNOSIS — F411 Generalized anxiety disorder: Secondary | ICD-10-CM | POA: Diagnosis not present

## 2022-03-30 NOTE — Plan of Care (Signed)
?  Problem: Anxiety Disorder CCP Problem  1 Reduce overall frequency, intensity, and duration of the anxiety so that daily functioning is not impaired per pt self report 3 out of 5 sessions documented.   ?Goal: LTG: Patient will score less than 5 on the Generalized Anxiety Disorder 7 Scale (GAD-7) ?Outcome: Progressing ?Goal: STG: Patient will participate in at least 80% of scheduled individual psychotherapy sessions ?Outcome: Progressing ?Intervention: Encourage verbalization of feelings/concerns/expectations ?Intervention: Encourage self-care activities ?Intervention: Assist with relaxation techniques, as appropriate (deep breathing exercises, meditation, guided imagery) ?Intervention: REVIEW PLEASE SKILLS (TREAT PHYSICAL ILLNESS, BALANCE EATING, AVOID MOOD-ALTERING SUBSTANCES, BALANCE SLEEP AND GET EXERCISE) WITH Amanda [name] ?Intervention: WORK WITH Amanda [name] TO TRACK SYMPTOMS, TRIGGERS AND/OR SKILL USE THROUGH A MOOD CHART, DIARY CARD, OR JOURNAL ?  ?

## 2022-03-30 NOTE — Progress Notes (Signed)
? ?  THERAPIST PROGRESS NOTE ? ?[facility] in office visit for patient and LCSW clinician ? ?Session Time: 10-11a ? ?Participation Level: Active ? ?Behavioral Response: NeatAlertAnxious ? ?Type of Therapy: Individual Therapy ? ?Treatment Goals addressed: Problem: Anxiety Disorder CCP Problem  1 Reduce overall frequency, intensity, and duration of the anxiety so that daily functioning is not impaired per pt self report 3 out of 5 sessions documented.   ?Goal: LTG: Patient will score less than 5 on the Generalized Anxiety Disorder 7 Scale (GAD-7) ?Outcome: Progressing ?Goal: STG: Patient will participate in at least 80% of scheduled individual psychotherapy sessions ?Outcome: Progressing ?Intervention: Encourage verbalization of feelings/concerns/expectations ?Intervention: Encourage self-care activities ?Intervention: Assist with relaxation techniques, as appropriate (deep breathing exercises, meditation, guided imagery) ?Intervention: REVIEW PLEASE SKILLS (TREAT PHYSICAL ILLNESS, BALANCE EATING, AVOID MOOD-ALTERING SUBSTANCES, BALANCE SLEEP AND GET EXERCISE) WITH [name] ?Intervention: WORK WITH [name] TO TRACK SYMPTOMS, TRIGGERS AND/OR SKILL USE THROUGH A MOOD CHART, DIARY CARD, OR JOURNAL ?  ? ?ProgressTowards Goals: Progressing ? ?Interventions: CBT ? ?Summary: Amanda [name] is a 26 y.o. female who presents with improving symptoms related to anxiety.  Pt reports that she is compliant with medication and is managing situational stressors well.   ? ?Allowed pt to explore and express thoughts and feelings associated with recent life situations and external stressors. Pt reports that she is being intentional about setting boundaries with family members and that they have not reacted in a way that pt expected. Pt continuing to have issues with in-laws "controlling". Pt states when she tried to set limits/boundaries with them, they got upset/defensive. Allowed pt to identify reasons why they would get defensive.  ? ?Allowed pt to  explore some conflicts that have happened due to her blended family--stepson's bio mom and his relationship with other siblings versus pts 2yo twins and relationship with them.  ? ?Reviewed coping skills to help manage stress, feelings of being overwhelmed, and importance of self care behaviors.  ? ? ?Suicidal/Homicidal: No ? ?Therapist Response: Pt is continuing to apply interventions learned in session into daily life situations. Pt is currently on track to meet goals utilizing interventions mentioned above. Personal growth and progress noted. Treatment to continue as indicated.  ? ? ?Plan: Return again in 4 weeks. ? ?Diagnosis: GAD (generalized anxiety disorder) ? ?Collaboration of Care: Other pt encouraged to continue with psychiatrist of record, Dr. Ursula Alert ? ?Patient/Guardian was advised Release of Information must be obtained prior to any record release in order to collaborate their care with an outside provider. Patient/Guardian was advised if they have not already done so to contact the registration department to sign all necessary forms in order for [place] to release information regarding their care.  ? ?Consent: Patient/Guardian gives verbal consent for treatment and assignment of benefits for services provided during this visit. Patient/Guardian expressed understanding and agreed to proceed.  ? ?Walburga Hudman R Iman Reinertsen, LCSW ?03/30/2022 ? ?

## 2022-04-05 NOTE — Progress Notes (Deleted)
     I,Sha'taria Espen Bethel,acting as a Education administrator for [employer], PA-C.,have documented all relevant documentation on the behalf of [name], PA-C,as directed by  [name], PA-C while in the presence of [name], PA-C.   Established patient visit   Patient: Amanda [name]   DOB: Aug 25, 1996   26 y.o. Female  MRN: [id number] Visit Date: 04/06/2022  Today's healthcare provider: [name], PA-C   No chief complaint on file.  Subjective    HPI  Patient states she has still been sick for a month  Medications: Outpatient Medications Prior to Visit  Medication Sig   azelastine (ASTELIN) 0.1 % nasal spray Place 2 sprays into both nostrils 2 (two) times daily. Use in each nostril as directed   escitalopram (LEXAPRO) 10 MG tablet Take 1 tablet (10 mg total) by mouth daily. Dose change   fluticasone (FLONASE) 50 MCG/ACT nasal spray Place 2 sprays into both nostrils daily. (Patient not taking: Reported on 03/08/2022)   gabapentin (NEURONTIN) 300 MG capsule Take 1 capsule (300 mg total) by mouth at bedtime.   hydrOXYzine (VISTARIL) 25 MG capsule TAKE 1-2 CAPSULES (25-50 MG TOTAL) BY MOUTH AT BEDTIME AS NEEDED. FOR ANXIETY AND SLEEP   NURTEC 75 MG TBDP DISSOLVE 1 TABLET ON THE TONGUE EVERY OTHER DAY (Patient not taking: Reported on 03/08/2022)   valACYclovir (VALTREX) 1000 MG tablet Take 1 tablet (1,000 mg total) by mouth daily.   valACYclovir (VALTREX) 500 MG tablet Take 500 mg by mouth daily.   No facility-administered medications prior to visit.    Review of Systems  {Labs  Heme  Chem  Endocrine  Serology  Results Review (optional):23779}   Objective    LMP 03/20/2022 (Approximate)  {Show previous vital signs (optional):23777}  Physical Exam  ***  No results found for any visits on 04/06/22.  Assessment & Plan     ***  No follow-ups on file.      {provider attestation***:1}   [name], PA-C  [facility] [phone]  (phone) [phone] (fax)  [place]

## 2022-04-06 ENCOUNTER — Ambulatory Visit: Payer: Medicaid Other | Admitting: Physician Assistant

## 2022-04-12 ENCOUNTER — Encounter: Payer: Self-pay | Admitting: Dermatology

## 2022-04-12 ENCOUNTER — Ambulatory Visit: Payer: Medicaid Other | Admitting: Dermatology

## 2022-04-12 DIAGNOSIS — D2239 Melanocytic nevi of other parts of face: Secondary | ICD-10-CM | POA: Diagnosis not present

## 2022-04-12 DIAGNOSIS — L700 Acne vulgaris: Secondary | ICD-10-CM

## 2022-04-12 MED ORDER — ADAPALENE 0.3 % EX GEL: CUTANEOUS | 2 refills | Status: DC

## 2022-04-12 MED ORDER — CLINDAMYCIN PHOS-BENZOYL PEROX 1.2-5 % EX GEL: CUTANEOUS | 2 refills | Status: DC

## 2022-04-12 MED ORDER — DOXYCYCLINE MONOHYDRATE 100 MG PO CAPS: 100.0000 mg | ORAL_CAPSULE | Freq: Every day | ORAL | 2 refills | Status: DC

## 2022-04-12 NOTE — Progress Notes (Signed)
? ?  Follow-Up Visit ?  ?Subjective  ?Amanda [name] is a 26 y.o. female who presents for the following: Acne (Face, chest, back. Hx of Rx treatment as a teenager. Flaring for past 2 years. Has used OTC acne treatment, not helping ). ? ? ? ?The following portions of the chart were reviewed this encounter and updated as appropriate:   ?  ? ?Review of Systems: No other skin or systemic complaints except as noted in HPI or Assessment and Plan. ? ? ?Objective  ?Well appearing patient in no apparent distress; mood and affect are within normal limits. ? ?A focused examination was performed including face, neck, chest and back. Relevant physical exam findings are noted in the Assessment and Plan. ? ?face, chest, back ?Inflammatory papules at cheeks, chin, upper lip; scattered inflammatory papules on back ? ?Left Tip of Nose ?0.2 cm indistinct pink tan macule ? ? ?Assessment & Plan  ?Acne vulgaris ?face, chest, back ? ?Chronic and persistent condition with duration or expected duration over one year. Condition is symptomatic / bothersome to patient. Not to goal. ? ?Take Doxycycline '100mg'$  1 capsule by mouth daily.  ? ?Start Adapalene 0.3% gel at bedtime, wash off in morning. ? ?Start Duac gel in morning, wash off at bedtime  ? ?Recommend gentle face wash and moisturizers (Neutrogena samples given) ? ?Doxycycline should be taken with food to prevent nausea. Do not lay down for 30 minutes after taking. Be cautious with sun exposure and use good sun protection while on this medication. Pregnant women should not take this medication.   ? ?Topical retinoid medications like tretinoin/Retin-A, adapalene/Differin, tazarotene/Fabior, and Epiduo/Epiduo Forte can cause dryness and irritation when first started. Only apply a pea-sized amount to the entire affected area. Avoid applying it around the eyes, edges of mouth and creases at the nose. If you experience irritation, use a good moisturizer first and/or apply the medicine less often.  If you are doing well with the medicine, you can increase how often you use it until you are applying every night. Be careful with sun protection while using this medication as it can make you sensitive to the sun. This medicine should not be used by pregnant women.   ? ?Benzoyl peroxide can cause dryness and irritation of the skin. It can also bleach fabric. When used together with Aczone (dapsone) cream, it can stain the skin orange.  ? ?doxycycline (MONODOX) 100 MG capsule - face, chest, back ?Take 1 capsule (100 mg total) by mouth daily. Take with food ? ?Clindamycin-Benzoyl Per, Refr, (DUAC) gel - face, chest, back ?Apply to face qam, wash off qhs ? ?Adapalene (DIFFERIN) 0.3 % gel - face, chest, back ?Apply to face qhs, wash off qam ? ?Fibrous papule of nose ?Left Tip of Nose ? ?Vs Acne Scar ? ?Benign-appearing.  Observation.  Call clinic for new or changing lesions.  Recommend daily use of broad spectrum spf 30+ sunscreen to sun-exposed areas.    ? ? ?Return for Acne Follow Up in 10 weeks. ? ?I, Emelia Salisbury, CMA, am acting as scribe for Brendolyn Patty, MD. ? ?Documentation: I have reviewed the above documentation for accuracy and completeness, and I agree with the above. ? ?Brendolyn Patty MD  ?

## 2022-04-12 NOTE — Patient Instructions (Addendum)
?Take Doxycycline '100mg'$  1 capsule by mouth daily.  ? ?Start Adapalene 0.3% gel at bedtime., wash off in morning. ? ?Start Duac gel in morning, wash off at bedtime  ? ?Recommend face wash with salicylic acid. ? ?Doxycycline should be taken with food to prevent nausea. Do not lay down for 30 minutes after taking. Be cautious with sun exposure and use good sun protection while on this medication. Pregnant women should not take this medication.   ? ?Topical retinoid medications like tretinoin/Retin-A, adapalene/Differin, tazarotene/Fabior, and Epiduo/Epiduo Forte can cause dryness and irritation when first started. Only apply a pea-sized amount to the entire affected area. Avoid applying it around the eyes, edges of mouth and creases at the nose. If you experience irritation, use a good moisturizer first and/or apply the medicine less often. If you are doing well with the medicine, you can increase how often you use it until you are applying every night. Be careful with sun protection while using this medication as it can make you sensitive to the sun. This medicine should not be used by pregnant women.   ? ?Benzoyl peroxide can cause dryness and irritation of the skin. It can also bleach fabric. When used together with Aczone (dapsone) cream, it can stain the skin orange.  ? ?If You Need Anything After Your Visit ? ?If you have any questions or concerns for your doctor, please call our main line at [phone] and press option 4 to reach your doctor's medical assistant. If no one answers, please leave a voicemail as directed and we will return your call as soon as possible. Messages left after 4 pm will be answered the following business day.  ? ?You may also send [place] a message via MyChart. We typically respond to MyChart messages within 1-2 business days. ? ?For prescription refills, please ask your pharmacy to contact our office. Our fax number is [phone]. ? ?If you have an urgent issue when the clinic is  closed that cannot wait until the next business day, you can page your doctor at the number below.   ? ?Please note that while we do our best to be available for urgent issues outside of office hours, we are not available 24/7.  ? ?If you have an urgent issue and are unable to reach [place], you may choose to seek medical care at your doctor's office, retail clinic, urgent care center, or emergency room. ? ?If you have a medical emergency, please immediately call 911 or go to the emergency department. ? ?Pager Numbers ? ?- Dr. Nehemiah Massed: [phone] ? ?- Dr. Laurence Ferrari: [phone] ? ?- Dr. Nicole Kindred: [phone] ? ?In the event of inclement weather, please call our main line at [phone] for an update on the status of any delays or closures. ? ?Dermatology Medication Tips: ?Please keep the boxes that topical medications come in in order to help keep track of the instructions about where and how to use these. Pharmacies typically print the medication instructions only on the boxes and not directly on the medication tubes.  ? ?If your medication is too expensive, please contact our office at [phone] option 4 or send [place] a message through [place].  ? ?We are unable to tell what your co-pay for medications will be in advance as this is different depending on your insurance coverage. However, we may be able to find a substitute medication at lower cost or fill out paperwork to get insurance to cover a needed medication.  ? ?If a prior authorization is  required to get your medication covered by your insurance company, please allow [place] 1-2 business days to complete this process. ? ?Drug prices often vary depending on where the prescription is filled and some pharmacies may offer cheaper prices. ? ?The website www.goodrx.com contains coupons for medications through different pharmacies. The prices here do not account for what the cost may be with help from insurance (it may be cheaper with your insurance), but the website can  give you the price if you did not use any insurance.  ?- You can print the associated coupon and take it with your prescription to the pharmacy.  ?- You may also stop by our office during regular business hours and pick up a GoodRx coupon card.  ?- If you need your prescription sent electronically to a different pharmacy, notify our office through [facility] or by phone at [phone] option 4. ? ? ? ? ?Si Usted Necesita Algo Despu?s de Su Visita ? ?Tambi?n puede enviarnos un mensaje a trav?s de MyChart. Por lo general respondemos a los mensajes de MyChart en el transcurso de 1 a 2 d?as h?biles. ? ?Para renovar recetas, por favor pida a su farmacia que se ponga en contacto con nuestra oficina. Nuestro n?mero de fax es el [phone]. ? ?Si tiene un asunto urgente cuando la cl?nica est? cerrada y que no puede esperar hasta el siguiente d?a h?bil, puede llamar/localizar a su doctor(a) al n?mero que aparece a continuaci?n.  ? ?Por favor, tenga en cuenta que aunque hacemos todo lo posible para estar disponibles para asuntos urgentes fuera del horario de oficina, no estamos disponibles las 24 horas del d?a, los 7 d?as de la semana.  ? ?Si tiene un problema urgente y no puede comunicarse con nosotros, puede optar por buscar atenci?n m?dica  en el consultorio de su doctor(a), en una cl?nica privada, en un centro de atenci?n urgente o en una sala de emergencias. ? ?Si tiene Engineer, maintenance (IT) m?dica, por favor llame inmediatamente al 911 o vaya a la sala de emergencias. ? ?N?meros de b?per ? ?- Dr. Nehemiah Massed: [phone] ? ?- Dra. Moye: [phone] ? ?- Dra. Nicole Kindred: [phone] ? ?En caso de inclemencias del tiempo, por favor llame a nuestra l?nea principal al [phone] para una actualizaci?n sobre el estado de cualquier retraso o cierre. ? ?Consejos para la medicaci?n en dermatolog?a: ?Por favor, guarde las cajas en las que vienen los medicamentos de uso t?pico para ayudarle a seguir las instrucciones sobre  d?nde y c?mo usarlos. Las farmacias generalmente imprimen las instrucciones del medicamento s?lo en las cajas y no directamente en los tubos del [place].  ? ?Si su medicamento es muy caro, por favor, p?ngase en contacto con Zigmund Daniel llamando al [phone] y presione la opci?n 4 o env?enos un mensaje a trav?s de MyChart.  ? ?No podemos decirle cu?l ser? su copago por los medicamentos por adelantado ya que esto es diferente dependiendo de la cobertura de su seguro. Sin embargo, es posible que podamos encontrar un medicamento sustituto a Electrical engineer un formulario para que el seguro cubra el medicamento que se considera necesario.  ? ?Si se requiere Ardelia Mems autorizaci?n previa para que su compa??a de seguros [place] su medicamento, por favor perm?tanos de 1 a 2 d?as h?biles para completar este proceso. ? ?Los precios de los medicamentos var?an con frecuencia dependiendo del Environmental consultant de d?nde se surte la receta y alguna farmacias pueden ofrecer precios m?s baratos. ? ?El sitio web www.goodrx.com tiene cupones para medicamentos de  diferentes farmacias. Los precios aqu? no tienen en cuenta lo que podr?a costar con la ayuda del seguro (puede ser m?s barato con su seguro), pero el sitio web puede darle el precio si no utiliz? ning?n seguro.  ?- Puede imprimir el cup?n correspondiente y llevarlo con su receta a la farmacia.  ?- Tambi?n puede pasar por nuestra oficina durante el horario de atenci?n regular y recoger una tarjeta de cupones de GoodRx.  ?- Si necesita que su receta se env?e electr?nicamente a Chiropodist, informe a nuestra oficina a trav?s de MyChart de  o por tel?fono llamando al [phone] y presione la opci?n 4.  ?

## 2022-04-24 ENCOUNTER — Other Ambulatory Visit: Payer: Self-pay | Admitting: Neurology

## 2022-04-24 ENCOUNTER — Encounter: Payer: Self-pay | Admitting: Neurology

## 2022-04-24 MED ORDER — UBRELVY 100 MG PO TABS: 1.0000 | ORAL_TABLET | ORAL | 5 refills | Status: DC | PRN

## 2022-05-01 ENCOUNTER — Encounter: Payer: Self-pay | Admitting: Neurology

## 2022-05-01 ENCOUNTER — Encounter: Payer: Self-pay | Admitting: Dermatology

## 2022-05-11 NOTE — Progress Notes (Signed)
Amanda [name] [place] [address] [place] Phone: [phone] Subjective:   Amanda [name], am serving as a scribe for Dr. Hulan Saas.  I'm seeing this patient by the request  of:  [name], PA-C  CC: Back and neck pain follow-up  KGU:[id number]  Amanda [name] is a 26 y.o. female coming in with complaint of back and neck pain. OMT 03/28/2022. Patient states same per usual. Bothersome mid back and when it pinches unable to move. No new complaints.  Patient is trying to be more active recently.  Thinks some of it is caring for her children could be contributing.  Medications patient has been prescribed: None  Taking:         Reviewed prior external information including notes and imaging from previsou exam, outside providers and external EMR if available.   As well as notes that were available from care everywhere and other healthcare systems.  Past medical history, social, surgical and family history all reviewed in electronic medical record.  No pertanent information unless stated regarding to the chief complaint.   Past Medical History:  Diagnosis Date   BRCA negative 09/2020   MyRisk neg except AXIN2 VUS   Family history of adverse reaction to anesthesia    heart condition   Family history of breast cancer 09/2020   IBIS=15.7%/riskscore=14.7%   Family history of pancreatic cancer    GERD (gastroesophageal reflux disease)    Migraine     Allergies  Allergen Reactions   Imitrex [Sumatriptan] Swelling    Facial swelling and burning lips   Penicillins Hives    Did it involve swelling of the face/tongue/throat, SOB, or low BP? No Did it involve sudden or severe rash/hives, skin peeling, or any reaction on the inside of your mouth or nose? Yes Did you need to seek medical attention at a hospital or doctor's office? Yes When did it last happen?      childhood allergy If all above answers are "NO", may proceed with  cephalosporin use.     Review of Systems:  No  visual changes, nausea, vomiting, diarrhea, constipation, dizziness, abdominal pain, skin rash, fevers, chills, night sweats, weight loss, swollen lymph nodes, body aches, joint swelling, chest pain, shortness of breath, mood changes. POSITIVE muscle aches, headache  Objective  Blood pressure 106/64, pulse 98, height 5' 4"  (1.626 m), weight 164 lb (74.4 kg), SpO2 98 %.   General: No apparent distress alert and oriented x3 mood and affect normal, dressed appropriately.  HEENT: Pupils equal, extraocular movements intact  Respiratory: Patient's speak in full sentences and does not appear short of breath  Cardiovascular: No lower extremity edema, non tender, no erythema  Increased weakness noted in the occipital region.  Tightness in the neck with sidebending bilaterally.  Changes noted in the x-rays bilaterally as well.  Seems to be more right greater than left.  Osteopathic findings  C2 flexed rotated and side bent right C4 flexed rotated and side bent right T3 extended rotated and side bent right inhaled rib T9 extended rotated and side bent right L2 flexed rotated and side bent right       Assessment and Plan:  Cervicogenic headache Chronic, with mild tenderness.  Has been traveling somewhat and getting out of her routine as much.  Going to try to get back into a as happy hydroxyzine and gabapentin.  Chronic, with mild exacerbation.  Discussed icing regimen and home exercises.  Follow-up with me again 4-8  weeks     Nonallopathic problems  Decision today to treat with OMT was based on Physical Exam  After verbal consent patient was treated with HVLA, ME, FPR techniques in cervical, rib, thoracic,  areas  Patient tolerated the procedure well with improvement in symptoms  Patient given exercises, stretches and lifestyle modifications  See medications in patient instructions if given  Patient will follow up in 4-8 weeks      The above documentation has been reviewed and is accurate and complete [name], DO        Note: This dictation was prepared with Dragon dictation along with smaller phrase technology. Any transcriptional errors that result from this process are unintentional.

## 2022-05-16 ENCOUNTER — Ambulatory Visit: Payer: Medicaid Other | Admitting: Family Medicine

## 2022-05-16 VITALS — BP 106/64 | HR 98 | Ht 64.0 in | Wt 164.0 lb

## 2022-05-16 DIAGNOSIS — M9908 Segmental and somatic dysfunction of rib cage: Secondary | ICD-10-CM | POA: Diagnosis not present

## 2022-05-16 DIAGNOSIS — M9902 Segmental and somatic dysfunction of thoracic region: Secondary | ICD-10-CM

## 2022-05-16 DIAGNOSIS — M9901 Segmental and somatic dysfunction of cervical region: Secondary | ICD-10-CM

## 2022-05-16 DIAGNOSIS — G4486 Cervicogenic headache: Secondary | ICD-10-CM

## 2022-05-16 NOTE — Patient Instructions (Signed)
See me in 7-8 weeks

## 2022-05-16 NOTE — Assessment & Plan Note (Signed)
Chronic, with mild tenderness.  Has been traveling somewhat and getting out of her routine as much.  Going to try to get back into a as happy hydroxyzine and gabapentin.  Chronic, with mild exacerbation.  Discussed icing regimen and home exercises.  Follow-up with me again 4-8 weeks

## 2022-05-18 ENCOUNTER — Ambulatory Visit: Payer: Medicaid Other | Admitting: Licensed Clinical Social Worker

## 2022-05-22 ENCOUNTER — Ambulatory Visit: Payer: Medicaid Other | Admitting: Dermatology

## 2022-05-22 DIAGNOSIS — L700 Acne vulgaris: Secondary | ICD-10-CM | POA: Diagnosis not present

## 2022-05-22 MED ORDER — SPIRONOLACTONE 50 MG PO TABS: 50.0000 mg | ORAL_TABLET | Freq: Every day | ORAL | 3 refills | Status: DC

## 2022-05-22 NOTE — Progress Notes (Signed)
   Follow-Up Visit   Subjective  Amanda [name] is a 26 y.o. female who presents for the following: Acne (Face, chest, back, Adapalene 0.3% gel qhs, Duac gel qam, had to d/c Doxycycline due to triggering migraines, it did help acne ). Tried to get [name], but not covered by insurance.   The following portions of the chart were reviewed this encounter and updated as appropriate:       Review of Systems:  No other skin or systemic complaints except as noted in HPI or Assessment and Plan.  Objective  Well appearing patient in no apparent distress; mood and affect are within normal limits.  A focused examination was performed including face, chest, back. Relevant physical exam findings are noted in the Assessment and Plan.  face, chest, back Resolving inflammatory paps cheeks, chin forehead, temples, upper back with inflamed comedones    Assessment & Plan  Acne vulgaris face, chest, back  Chronic and persistent condition with duration or expected duration over one year. Condition is symptomatic/ bothersome to patient. Not currently at goal. Unable to tolerate Doxycycline due to worsening migraines.  BP today 114/68  Cont Adapalene gel 0.3% qhs Cont Duac gel qam Start Spironolactone '50mg'$  1 po qd, may increase to 100 mg if well-tolerated.   Spironolactone can cause increased urination and cause blood pressure to decrease. Please watch for signs of lightheadedness and be cautious when changing position. It can sometimes cause breast tenderness or an irregular period in premenopausal women. It can also increase potassium. The increase in potassium usually is not a concern unless you are taking other medicines that also increase potassium, so please be sure your doctor knows all of the other medications you are taking. This medication should not be taken by pregnant women.  This medicine should also not be taken together with sulfa drugs like Bactrim (trimethoprim/sulfamethexazole).     Topical retinoid medications like tretinoin/Retin-A, adapalene/Differin, tazarotene/Fabior, and Epiduo/Epiduo Forte can cause dryness and irritation when first started. Only apply a pea-sized amount to the entire affected area. Avoid applying it around the eyes, edges of mouth and creases at the nose. If you experience irritation, use a good moisturizer first and/or apply the medicine less often. If you are doing well with the medicine, you can increase how often you use it until you are applying every night. Be careful with sun protection while using this medication as it can make you sensitive to the sun. This medicine should not be used by pregnant women.    Benzoyl peroxide can cause dryness and irritation of the skin. It can also bleach fabric. When used together with Aczone (dapsone) cream, it can stain the skin orange.   spironolactone (ALDACTONE) 50 MG tablet - face, chest, back Take 1 tablet (50 mg total) by mouth daily.  Related Medications Clindamycin-Benzoyl Per, Refr, (DUAC) gel Apply to face qam, wash off qhs  Adapalene (DIFFERIN) 0.3 % gel Apply to face qhs, wash off qam   Return for as scheduled for acne f/u.  I, Othelia Pulling, RMA, am acting as scribe for Brendolyn Patty, MD .  Documentation: I have reviewed the above documentation for accuracy and completeness, and I agree with the above.  Brendolyn Patty MD

## 2022-05-22 NOTE — Patient Instructions (Signed)

## 2022-05-25 ENCOUNTER — Telehealth (HOSPITAL_COMMUNITY): Payer: Self-pay | Admitting: Pharmacy Technician

## 2022-05-25 ENCOUNTER — Other Ambulatory Visit (HOSPITAL_COMMUNITY): Payer: Self-pay

## 2022-05-25 NOTE — Telephone Encounter (Signed)
Patient Advocate Encounter  Prior Authorization for Roselyn Meier '100MG'$  tablets has been approved.    PA# [id number] Effective dates: 05/11/2022 through 05/25/2023      Lyndel Safe, [place] Patient Advocate Specialist [place] Patient Advocate Team Direct Number: [phone]  Fax: [phone]

## 2022-05-25 NOTE — Telephone Encounter (Signed)
Patient Advocate Encounter   Received notification that prior authorization for Ubrelvy '100MG'$  tablets is required.   PA submitted on 05/25/2022 Key [id number] Status is pending       Lyndel Safe, [place] Patient Advocate Specialist [place] Patient Advocate Team Direct Number: [phone]  Fax: [phone]

## 2022-05-28 ENCOUNTER — Other Ambulatory Visit: Payer: Self-pay | Admitting: Psychiatry

## 2022-05-28 DIAGNOSIS — F3342 Major depressive disorder, recurrent, in full remission: Secondary | ICD-10-CM

## 2022-05-28 DIAGNOSIS — F411 Generalized anxiety disorder: Secondary | ICD-10-CM

## 2022-05-29 ENCOUNTER — Encounter: Payer: Self-pay | Admitting: Dermatology

## 2022-05-29 ENCOUNTER — Telehealth: Payer: Self-pay | Admitting: Psychiatry

## 2022-05-29 DIAGNOSIS — F3342 Major depressive disorder, recurrent, in full remission: Secondary | ICD-10-CM

## 2022-05-29 DIAGNOSIS — F411 Generalized anxiety disorder: Secondary | ICD-10-CM

## 2022-05-29 MED ORDER — ESCITALOPRAM OXALATE 10 MG PO TABS: 10.0000 mg | ORAL_TABLET | Freq: Every day | ORAL | 0 refills | Status: DC

## 2022-05-29 NOTE — Telephone Encounter (Signed)
I have sent Lexapro 10 mg p.o. daily to pharmacy.  Patient has scheduled a follow-up appointment.

## 2022-06-01 ENCOUNTER — Encounter: Payer: Self-pay | Admitting: Physician Assistant

## 2022-06-01 ENCOUNTER — Ambulatory Visit (INDEPENDENT_AMBULATORY_CARE_PROVIDER_SITE_OTHER): Payer: Medicaid Other | Admitting: Physician Assistant

## 2022-06-01 VITALS — BP 104/63 | HR 102 | Ht 64.0 in | Wt 162.6 lb

## 2022-06-01 DIAGNOSIS — L02214 Cutaneous abscess of groin: Secondary | ICD-10-CM

## 2022-06-01 MED ORDER — LIDOCAINE HCL 1 % IJ SOLN
2.5000 mL | Freq: Once | INTRAMUSCULAR | Status: AC
  Administered 2022-06-01: 2.5 mL via INTRADERMAL

## 2022-06-01 NOTE — Progress Notes (Unsigned)
I,[name],acting as a Education administrator for [employer], PA-C.,have documented all relevant documentation on the behalf of [name], PA-C,as directed by  [name], PA-C while in the presence of [name], PA-C.   Established patient visit   Patient: Amanda [name]   DOB: [birth year]   26 y.o. Female  MRN: [id number] Visit Date: 06/01/2022  Today's healthcare provider: [name], PA-C   No chief complaint on file.  Subjective    HPI  -Patient reports that lymph node has been very painful to the point that she can not wear undergarments. States that it is really hard, minor discharge from it yesterday as well as associated with redness. Located on right mobile  Depression, Follow-up  She  was last seen for this 4 months ago. Changes made at last visit include xanax advised to use 1/2 to 1 pill at night before bed to sleep until we can figure out a better plan with her psychiatrist.   She reports excellent compliance with treatment. She is not having side effects.   She reports excellent tolerance of treatment. Current symptoms include:  none She feels she is Improved since last visit.     03/06/2022    4:09 PM 02/28/2022    9:12 AM 02/17/2022   12:21 PM  Depression screen PHQ 2/9  Decreased Interest  2   Down, Depressed, Hopeless  1   PHQ - 2 Score  3   Altered sleeping  1   Tired, decreased energy  1   Change in appetite  1   Feeling bad or failure about yourself   1   Trouble concentrating  1   Moving slowly or fidgety/restless  1   Suicidal thoughts  0   PHQ-9 Score  9   Difficult doing work/chores  Not difficult at all      Information is confidential and restricted. Go to Review Flowsheets to unlock data.    -----------------------------------------------------------------------------------------   Medications: Outpatient Medications Prior to Visit  Medication Sig   Ubrogepant (UBRELVY) 100 MG TABS Take 1 tablet by mouth as needed (May  repeat after 2 hours.  Maximum 2 tablets in 24 hours.).   Adapalene (DIFFERIN) 0.3 % gel Apply to face qhs, wash off qam   azelastine (ASTELIN) 0.1 % nasal spray Place 2 sprays into both nostrils 2 (two) times daily. Use in each nostril as directed   Clindamycin-Benzoyl Per, Refr, (DUAC) gel Apply to face qam, wash off qhs   escitalopram (LEXAPRO) 10 MG tablet Take 1 tablet (10 mg total) by mouth daily. Dose change   fluticasone (FLONASE) 50 MCG/ACT nasal spray Place 2 sprays into both nostrils daily. (Patient not taking: Reported on 03/08/2022)   gabapentin (NEURONTIN) 300 MG capsule Take 1 capsule (300 mg total) by mouth at bedtime.   hydrOXYzine (VISTARIL) 25 MG capsule TAKE 1-2 CAPSULES (25-50 MG TOTAL) BY MOUTH AT BEDTIME AS NEEDED. FOR ANXIETY AND SLEEP   spironolactone (ALDACTONE) 50 MG tablet Take 1 tablet (50 mg total) by mouth daily.   valACYclovir (VALTREX) 1000 MG tablet Take 1 tablet (1,000 mg total) by mouth daily.   valACYclovir (VALTREX) 500 MG tablet Take 500 mg by mouth daily.   No facility-administered medications prior to visit.    Review of Systems  {Labs  Heme  Chem  Endocrine  Serology  Results Review (optional):23779}   Objective    There were no vitals taken for this visit. {Show previous vital signs (optional):23777}  Physical Exam  ***  No results found for any visits on 06/01/22.  Assessment & Plan     ***  No follow-ups on file.      {provider attestation***:1}   [name], PA-C  [facility] [phone] (phone) [phone] (fax)  [place]

## 2022-06-05 ENCOUNTER — Encounter: Payer: Self-pay | Admitting: Physician Assistant

## 2022-06-05 DIAGNOSIS — L02214 Cutaneous abscess of groin: Secondary | ICD-10-CM | POA: Insufficient documentation

## 2022-06-05 NOTE — Assessment & Plan Note (Signed)
No underlying lymphadenopathy. No longer palpable after I&D, see above  Advised pt to continue with warm compresses.

## 2022-06-06 ENCOUNTER — Telehealth (INDEPENDENT_AMBULATORY_CARE_PROVIDER_SITE_OTHER): Payer: Medicaid Other | Admitting: Psychiatry

## 2022-06-06 ENCOUNTER — Encounter: Payer: Self-pay | Admitting: Psychiatry

## 2022-06-06 DIAGNOSIS — F3342 Major depressive disorder, recurrent, in full remission: Secondary | ICD-10-CM | POA: Insufficient documentation

## 2022-06-06 DIAGNOSIS — F411 Generalized anxiety disorder: Secondary | ICD-10-CM | POA: Diagnosis not present

## 2022-06-06 MED ORDER — ESCITALOPRAM OXALATE 10 MG PO TABS: 15.0000 mg | ORAL_TABLET | Freq: Every day | ORAL | 0 refills | Status: DC

## 2022-06-06 NOTE — Progress Notes (Unsigned)
Virtual Visit via Video Note  I connected with Amanda [name] on 06/06/22 at  2:00 PM EDT by a video enabled telemedicine application and verified that I am speaking with the correct person using two identifiers.  Location Provider Location : [facility] Patient Location : Car  Participants: Patient , Provider   I discussed the limitations of evaluation and management by telemedicine and the availability of in person appointments. The patient expressed understanding and agreed to proceed.   I discussed the assessment and treatment plan with the patient. The patient was provided an opportunity to ask questions and all were answered. The patient agreed with the plan and demonstrated an understanding of the instructions.   The patient was advised to call back or seek an in-person evaluation if the symptoms worsen or if the condition fails to improve as anticipated.   [place] MD OP Progress Note  06/06/2022 2:31 PM Amanda [name]  MRN:  [id number]  Chief Complaint:  Chief Complaint  Patient presents with   Follow-up: 26 year old female with history of GAD, MDD, presented for medication management.   HPI: Amanda [name] is a 26 year old female, engaged, self-employed, lives in [place], has a history of GAD, MDD in full remission was evaluated by telemedicine today.  Patient today reports she is currently struggling with anxiety, feeling restless, anxious, on edge, crying spells at times due to worsening anxiety.  This has been getting worse since the past several weeks.  Patient reports she is planning a wedding at this time, and that is likely contributing to her anxiety symptoms.  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  Patient reports she wanted to do this and that has been helping her family out financially.  She however does not have a babysitter and has to take her children with her when she does her work.  That also could be overwhelming.  Reports  sleep was affected the past few weeks due to her children being sick with a virus.  Currently they are getting better.  Does have hydroxyzine available as needed for anxiety and sleep however has not used it.  Patient denies any suicidality, homicidality or perceptual disturbances.  Patient reports she had a therapy session couple of months ago.  She reports she does have an appointment coming up.  Motivated to stay in therapy.  Currently compliant on Lexapro, denies side effects.  Agreeable to dosage increase.  Denies any other concerns today.  Visit Diagnosis:    ICD-10-CM   1. GAD (generalized anxiety disorder)  F41.1 escitalopram (LEXAPRO) 10 MG tablet    2. MDD (major depressive disorder), recurrent, in full remission ([place])  F33.42 escitalopram (LEXAPRO) 10 MG tablet      Past Psychiatric History: Reviewed past psychiatric history from progress note on 02/02/2022.  Past trials of Zoloft-did not work, Effexor-made her suicidal.  Past Medical History:  Past Medical History:  Diagnosis Date   BRCA negative 09/2020   MyRisk neg except AXIN2 VUS   Family history of adverse reaction to anesthesia    heart condition   Family history of breast cancer 09/2020   IBIS=15.7%/riskscore=14.7%   Family history of pancreatic cancer    GERD (gastroesophageal reflux disease)    Migraine     Past Surgical History:  Procedure Laterality Date   NO PAST SURGERIES     WISDOM TOOTH EXTRACTION      Family Psychiatric History: Reviewed family psychiatric history from progress note on 02/02/2022.  Family History:  Family History  Problem Relation Age of Onset   Migraines Mother    Skin cancer Father    Asthma Maternal Grandmother    Hypertension Maternal Grandmother    Thyroid disease Maternal Grandmother    Hypertension Maternal Grandfather    Pancreatic cancer Maternal Grandfather 59   Colon cancer Maternal Grandfather 60   Hypertension Paternal Grandmother    Diabetes Paternal  Grandmother    Hypertension Paternal Grandfather    Diabetes Paternal Grandfather    Colon cancer Paternal Grandfather 63   Thyroid disease Maternal Aunt    Breast cancer Maternal Aunt 81   Allergies Brother     Social History: Reviewed social history from progress note on 02/02/2022. Social History   Socioeconomic History   Marital status: Single    Spouse name: [name]   Number of children: 2   Years of education: Not on file   Highest education level: Associate degree: occupational, Hotel manager, or vocational program  Occupational History   Occupation: Massage Therapy  Tobacco Use   Smoking status: Never   Smokeless tobacco: Never  Vaping Use   Vaping Use: Never used  Substance and Sexual Activity   Alcohol use: Never   Drug use: No   Sexual activity: Yes    Partners: Male    Birth control/protection: None  Other Topics Concern   Not on file  Social History Narrative   ** Merged History Encounter **       One story home Right-handed Caffeine: occasional coffee   Social Determinants of Health   Financial Resource Strain: Not on file  Food Insecurity: Not on file  Transportation Needs: Not on file  Physical Activity: Not on file  Stress: Stress Concern Present (12/01/2021)   [employer] of Stress : Rather much  Social Connections: Not on file    Allergies:  Allergies  Allergen Reactions   Imitrex [Sumatriptan] Swelling    Facial swelling and burning lips   Penicillins Hives    Did it involve swelling of the face/tongue/throat, SOB, or low BP? No Did it involve sudden or severe rash/hives, skin peeling, or any reaction on the inside of your mouth or nose? Yes Did you need to seek medical attention at a hospital or doctor's office? Yes When did it last happen?      childhood allergy If all above answers are "NO", may proceed with cephalosporin use.    Metabolic Disorder  Labs: Lab Results  Component Value Date   HGBA1C 5.3 01/07/2015   No results found for: "PROLACTIN" Lab Results  Component Value Date   CHOL 135 06/28/2020   TRIG 54 06/28/2020   HDL 54 06/28/2020   LDLCALC 69 06/28/2020   Lab Results  Component Value Date   TSH 1.600 03/08/2022   TSH 0.749 06/28/2020    Therapeutic Level Labs: No results found for: "LITHIUM" No results found for: "VALPROATE" No results found for: "CBMZ"  Current Medications: Current Outpatient Medications  Medication Sig Dispense Refill   Adapalene (DIFFERIN) 0.3 % gel Apply to face qhs, wash off qam 45 g 2   Clindamycin-Benzoyl Per, Refr, (DUAC) gel Apply to face qam, wash off qhs 45 g 2   fluticasone (FLONASE) 50 MCG/ACT nasal spray Place 2 sprays into both nostrils daily.     gabapentin (NEURONTIN) 300 MG capsule Take 1 capsule (300 mg total) by mouth at bedtime. 30 capsule 5  spironolactone (ALDACTONE) 50 MG tablet Take 1 tablet (50 mg total) by mouth daily. 30 tablet 3   Ubrogepant (UBRELVY) 100 MG TABS Take 1 tablet by mouth as needed (May repeat after 2 hours.  Maximum 2 tablets in 24 hours.). (Patient not taking: Reported on 06/06/2022) 10 tablet 5   azelastine (ASTELIN) 0.1 % nasal spray Place 2 sprays into both nostrils 2 (two) times daily. Use in each nostril as directed (Patient not taking: Reported on 06/06/2022) 30 mL 3   escitalopram (LEXAPRO) 10 MG tablet Take 1.5 tablets (15 mg total) by mouth daily. Dose change 135 tablet 0   hydrOXYzine (VISTARIL) 25 MG capsule TAKE 1-2 CAPSULES (25-50 MG TOTAL) BY MOUTH AT BEDTIME AS NEEDED. FOR ANXIETY AND SLEEP (Patient not taking: Reported on 06/06/2022) 180 capsule 0   valACYclovir (VALTREX) 1000 MG tablet Take 1 tablet (1,000 mg total) by mouth daily. (Patient not taking: Reported on 06/06/2022) 90 tablet 2   No current facility-administered medications for this visit.     Musculoskeletal: Strength & Muscle Tone:  UTA Gait & Station:  Seated Patient  leans: N/A  Psychiatric Specialty Exam: Review of Systems  Psychiatric/Behavioral:  Positive for sleep disturbance. The patient is nervous/anxious.   All other systems reviewed and are negative.   There were no vitals taken for this visit.There is no height or weight on file to calculate BMI.  General Appearance: Casual  Eye Contact:  Fair  Speech:  Clear and Coherent  Volume:  Normal  Mood:  Anxious  Affect:  Congruent  Thought Process:  Goal Directed and Descriptions of Associations: Intact  Orientation:  Full (Time, Place, and Person)  Thought Content: Logical   Suicidal Thoughts:  No  Homicidal Thoughts:  No  Memory:  Immediate;   Fair Recent;   Fair Remote;   Fair  Judgement:  Fair  Insight:  Fair  Psychomotor Activity:  Normal  Concentration:  Concentration: Fair and Attention Span: Fair  Recall:  [employer] of Knowledge: Fair  Language: Fair  Akathisia:  No  Handed:  Right  AIMS (if indicated): done  Assets:  Communication Skills Desire for Improvement Housing Intimacy Social Support Transportation  ADL's:  Intact  Cognition: WNL  Sleep:   Improving, was restless recently due to kids being sick   Screenings: GAD-7    Flowsheet Row Video Visit from 06/06/2022 in [place] Office Visit from 06/01/2022 in [facility] Video Visit from 03/06/2022 in [place] Counselor from 02/16/2022 in [place] Video Visit from 02/13/2022 in [place]  Total GAD-7 Score 11 10 10 18 7       PHQ2-9    [place] Video Visit from 06/06/2022 in [place] Visit from 06/01/2022 in [facility] Video Visit from 03/06/2022 in [place] Office Visit from 02/28/2022 in [place] from 02/16/2022 in [place]  PHQ-2 Total  Score 0 2 0 3 3  PHQ-9 Total Score -- 11 2 9 15       Flowsheet Row Video Visit from 06/06/2022 in [place] Counselor from 03/30/2022 in [place] [facility] from 03/21/2022 in [facility] No Risk No Risk No Risk        Assessment and Plan: Amanda [name] is a 26 year old female, engaged, lives in [place], has a history of GAD, MDD was  evaluated by telemedicine today.  Patient with continued anxiety, sleep problems, will benefit from the following plan.  Plan GAD-unstable Increase Lexapro to 15 mg p.o. daily Patient advised to have more frequent psychotherapy sessions.  She does have upcoming appointment coming up with Ms. [name] Hussami.  Will coordinate care. Hydroxyzine 25-50 mg at bedtime as needed.  Encouraged to use it.  MDD in remission Lexapro was prescribed   Labs reviewed-TSH-dated 03/08/2022-within normal limits.  Follow-up in clinic in 6 weeks or sooner if needed.   Collaboration of Care: Collaboration of Care: Referral or follow-up with counselor/therapist AEB encouraged to follow up with therapist.  Patient/Guardian was advised Release of Information must be obtained prior to any record release in order to collaborate their care with an outside provider. Patient/Guardian was advised if they have not already done so to contact the registration department to sign all necessary forms in order for [place] to release information regarding their care.   Consent: Patient/Guardian gives verbal consent for treatment and assignment of benefits for services provided during this visit. Patient/Guardian expressed understanding and agreed to proceed.    Ursula Alert, MD 06/06/2022, 2:31 PM

## 2022-06-15 ENCOUNTER — Encounter: Payer: Self-pay | Admitting: Physician Assistant

## 2022-06-19 ENCOUNTER — Ambulatory Visit: Payer: Medicaid Other | Admitting: Physician Assistant

## 2022-06-19 NOTE — Progress Notes (Deleted)
   Acute Office Visit  Subjective:     Patient ID: Amanda [name], female    DOB: [birth year], 26 y.o.   MRN: [id number]  No chief complaint on file.   HPI Patient is in today for throat pain.  ROS      Objective:    There were no vitals taken for this visit. {Vitals History (Optional):23777}  Physical Exam  No results found for any visits on 06/19/22.      Assessment & Plan:   Problem List Items Addressed This Visit   None   No orders of the defined types were placed in this encounter.   No follow-ups on file.  Beverlee Nims, [place]

## 2022-06-21 ENCOUNTER — Encounter: Payer: Self-pay | Admitting: Physician Assistant

## 2022-06-21 ENCOUNTER — Ambulatory Visit (INDEPENDENT_AMBULATORY_CARE_PROVIDER_SITE_OTHER): Payer: Medicaid Other | Admitting: Physician Assistant

## 2022-06-21 VITALS — BP 109/74 | HR 75 | Ht 64.0 in | Wt 161.8 lb

## 2022-06-21 DIAGNOSIS — J358 Other chronic diseases of tonsils and adenoids: Secondary | ICD-10-CM | POA: Diagnosis not present

## 2022-06-21 NOTE — Assessment & Plan Note (Signed)
At this point recommending she return to ENT for consultation of tonsil removal.  No visible infection today.

## 2022-06-21 NOTE — Progress Notes (Signed)
    I,Amanda [name],acting as a Education administrator for [employer], PA-C.,have documented all relevant documentation on the behalf of Amanda Kirschner, PA-C,as directed by  Amanda Kirschner, PA-C while in the presence of Amanda Kirschner, PA-C.   Acute Office Visit  Subjective:     Patient ID: Amanda [name], female    DOB: 06-Oct-1996, 26 y.o.   MRN: [id number]  Cc. Tonsil stones   HPI [name] reports continued tonsil stones that she has to manually remove 2-3 times a week. They often bleed and get stuck. Feels her neck gets swollen and painful.  Review of Systems  HENT:  Positive for sore throat.         Objective:    Blood pressure 109/74, pulse 75, height '5\' 4"'$  (1.626 m), weight 161 lb 12.8 oz (73.4 kg), SpO2 99 %.   Physical Exam Vitals reviewed.  Constitutional:      Appearance: She is not ill-appearing.  HENT:     Head: Normocephalic.     Mouth/Throat:     Pharynx: Posterior oropharyngeal erythema present.     Tonsils: No tonsillar exudate or tonsillar abscesses. 0 on the right. 0 on the left.     Comments: No visible tonsil stones today Eyes:     Conjunctiva/sclera: Conjunctivae normal.  Cardiovascular:     Rate and Rhythm: Normal rate.  Pulmonary:     Effort: Pulmonary effort is normal. No respiratory distress.  Lymphadenopathy:     Cervical: No cervical adenopathy.  Neurological:     General: No focal deficit present.     Mental Status: She is alert and oriented to person, place, and time.  Psychiatric:        Mood and Affect: Mood normal.        Behavior: Behavior normal.     No results found for any visits on 06/21/22.      Assessment & Plan:   Problem List Items Addressed This Visit       Respiratory   Adenoid vegetations - Primary    At this point recommending she return to ENT for consultation of tonsil removal.  No visible infection today.        I, Amanda Kirschner, PA-C have reviewed all documentation for this visit. The documentation on  06/21/2022 for  the exam, diagnosis, procedures, and orders are all accurate and complete.  Amanda Kirschner, PA-C [facility] [address] [place], [place], [address] Office: [phone] Fax: [phone]

## 2022-06-27 ENCOUNTER — Encounter: Payer: Medicaid Other | Admitting: Physician Assistant

## 2022-06-27 ENCOUNTER — Ambulatory Visit: Payer: Medicaid Other | Admitting: Dermatology

## 2022-06-27 DIAGNOSIS — L700 Acne vulgaris: Secondary | ICD-10-CM

## 2022-06-27 NOTE — Patient Instructions (Signed)
Spironolactone can cause increased urination and cause blood pressure to decrease. Please watch for signs of lightheadedness and be cautious when changing position. It can sometimes cause breast tenderness or an irregular period in premenopausal women. It can also increase potassium. The increase in potassium usually is not a concern unless you are taking other medicines that also increase potassium, so please be sure your doctor knows all of the other medications you are taking. This medication should not be taken by pregnant women.  This medicine should also not be taken together with sulfa drugs like Bactrim (trimethoprim/sulfamethexazole).    Topical retinoid medications like tretinoin/Retin-A, adapalene/Differin, tazarotene/Fabior, and Epiduo/Epiduo Forte can cause dryness and irritation when first started. Only apply a pea-sized amount to the entire affected area. Avoid applying it around the eyes, edges of mouth and creases at the nose. If you experience irritation, use a good moisturizer first and/or apply the medicine less often. If you are doing well with the medicine, you can increase how often you use it until you are applying every night. Be careful with sun protection while using this medication as it can make you sensitive to the sun. This medicine should not be used by pregnant women.    Due to recent changes in healthcare laws, you may see results of your pathology and/or laboratory studies on MyChart before the doctors have had a chance to review them. We understand that in some cases there may be results that are confusing or concerning to you. Please understand that not all results are received at the same time and often the doctors may need to interpret multiple results in order to provide you with the best plan of care or course of treatment. Therefore, we ask that you please give us 2 business days to thoroughly review all your results before contacting the office for clarification. Should  we see a critical lab result, you will be contacted sooner.   If You Need Anything After Your Visit  If you have any questions or concerns for your doctor, please call our main line at [phone] and press option 4 to reach your doctor's medical assistant. If no one answers, please leave a voicemail as directed and we will return your call as soon as possible. Messages left after 4 pm will be answered the following business day.   You may also send us a message via MyChart. We typically respond to MyChart messages within 1-2 business days.  For prescription refills, please ask your pharmacy to contact our office. Our fax number is [phone].  If you have an urgent issue when the clinic is closed that cannot wait until the next business day, you can page your doctor at the number below.    Please note that while we do our best to be available for urgent issues outside of office hours, we are not available 24/7.   If you have an urgent issue and are unable to reach us, you may choose to seek medical care at your doctor's office, retail clinic, urgent care center, or emergency room.  If you have a medical emergency, please immediately call 911 or go to the emergency department.  Pager Numbers  - Dr. Kowalski: [phone]  - Dr. Moye: [phone]  - Dr. Stewart: [phone]  In the event of inclement weather, please call our main line at [phone] for an update on the status of any delays or closures.  Dermatology Medication Tips: Please keep the boxes that topical medications come in in order   to help keep track of the instructions about where and how to use these. Pharmacies typically print the medication instructions only on the boxes and not directly on the medication tubes.   If your medication is too expensive, please contact our office at [phone] option 4 or send us a message through MyChart.   We are unable to tell what your co-pay for medications will be in  advance as this is different depending on your insurance coverage. However, we may be able to find a substitute medication at lower cost or fill out paperwork to get insurance to cover a needed medication.   If a prior authorization is required to get your medication covered by your insurance company, please allow us 1-2 business days to complete this process.  Drug prices often vary depending on where the prescription is filled and some pharmacies may offer cheaper prices.  The website www.goodrx.com contains coupons for medications through different pharmacies. The prices here do not account for what the cost may be with help from insurance (it may be cheaper with your insurance), but the website can give you the price if you did not use any insurance.  - You can print the associated coupon and take it with your prescription to the pharmacy.  - You may also stop by our office during regular business hours and pick up a GoodRx coupon card.  - If you need your prescription sent electronically to a different pharmacy, notify our office through [place] MyChart or by phone at [phone] option 4.     Si Usted Necesita Algo Despus de Su Visita  Tambin puede enviarnos un mensaje a travs de MyChart. Por lo general respondemos a los mensajes de MyChart en el transcurso de 1 a 2 das hbiles.  Para renovar recetas, por favor pida a su farmacia que se ponga en contacto con nuestra oficina. Nuestro nmero de fax es el [phone].  Si tiene un asunto urgente cuando la clnica est cerrada y que no puede esperar hasta el siguiente da hbil, puede llamar/localizar a su doctor(a) al nmero que aparece a continuacin.   Por favor, tenga en cuenta que aunque hacemos todo lo posible para estar disponibles para asuntos urgentes fuera del horario de oficina, no estamos disponibles las 24 horas del da, los 7 das de la semana.   Si tiene un problema urgente y no puede comunicarse con nosotros, puede  optar por buscar atencin mdica  en el consultorio de su doctor(a), en una clnica privada, en un centro de atencin urgente o en una sala de emergencias.  Si tiene una emergencia mdica, por favor llame inmediatamente al 911 o vaya a la sala de emergencias.  Nmeros de bper  - Dr. Kowalski: [phone]  - Dra. Moye: [phone]  - Dra. Stewart: [phone]  En caso de inclemencias del tiempo, por favor llame a nuestra lnea principal al [phone] para una actualizacin sobre el estado de cualquier retraso o cierre.  Consejos para la medicacin en dermatologa: Por favor, guarde las cajas en las que vienen los medicamentos de uso tpico para ayudarle a seguir las instrucciones sobre dnde y cmo usarlos. Las farmacias generalmente imprimen las instrucciones del medicamento slo en las cajas y no directamente en los tubos del medicamento.   Si su medicamento es muy caro, por favor, pngase en contacto con nuestra oficina llamando al [phone] y presione la opcin 4 o envenos un mensaje a travs de MyChart.   No podemos decirle cul ser su copago   por los medicamentos por adelantado ya que esto es diferente dependiendo de la cobertura de su seguro. Sin embargo, es posible que podamos encontrar un medicamento sustituto a menor costo o llenar un formulario para que el seguro cubra el medicamento que se considera necesario.   Si se requiere una autorizacin previa para que su compaa de seguros cubra su medicamento, por favor permtanos de 1 a 2 das hbiles para completar este proceso.  Los precios de los medicamentos varan con frecuencia dependiendo del lugar de dnde se surte la receta y alguna farmacias pueden ofrecer precios ms baratos.  El sitio web www.goodrx.com tiene cupones para medicamentos de diferentes farmacias. Los precios aqu no tienen en cuenta lo que podra costar con la ayuda del seguro (puede ser ms barato con su seguro), pero el sitio web puede darle el  precio si no utiliz ningn seguro.  - Puede imprimir el cupn correspondiente y llevarlo con su receta a la farmacia.  - Tambin puede pasar por nuestra oficina durante el horario de atencin regular y recoger una tarjeta de cupones de GoodRx.  - Si necesita que su receta se enve electrnicamente a una farmacia diferente, informe a nuestra oficina a travs de MyChart de [place] o por telfono llamando al [phone] y presione la opcin 4.  

## 2022-06-27 NOTE — Progress Notes (Signed)
   Follow-Up Visit   Subjective  Amanda [name] is a 26 y.o. female who presents for the following: Acne (10 wk f/u. Taking Spironolactone '50mg'$  daily. She has experienced increased urination, but tolerating ok and fewer acne breakouts. She is also using adapalene 0.3% gel and clindamycin-benzoyl peroxide gel. ).  Overall pt is improving and wants to continue current regimen.  Patient accompanied by children.   The following portions of the chart were reviewed this encounter and updated as appropriate:       Review of Systems:  No other skin or systemic complaints except as noted in HPI or Assessment and Plan.  Objective  Well appearing patient in no apparent distress; mood and affect are within normal limits.  A focused examination was performed including face, chest, back. Relevant physical exam findings are noted in the Assessment and Plan.  face, chest, back Resolving inflammatory papules on the right cheek and jaw.    Assessment & Plan  Acne vulgaris face, chest, back  Chronic and persistent condition with duration or expected duration over one year. Condition is symptomatic / bothersome to patient. Improving.  BP 97/62  Cont Adapalene gel 0.3% qhs Cont Duac gel qam Cont Spironolactone '50mg'$  1 po qd, may increase to 100 mg if well-tolerated.  Topical retinoid medications like tretinoin/Retin-A, adapalene/Differin, tazarotene/Fabior, and Epiduo/Epiduo Forte can cause dryness and irritation when first started. Only apply a pea-sized amount to the entire affected area. Avoid applying it around the eyes, edges of mouth and creases at the nose. If you experience irritation, use a good moisturizer first and/or apply the medicine less often. If you are doing well with the medicine, you can increase how often you use it until you are applying every night. Be careful with sun protection while using this medication as it can make you sensitive to the sun. This medicine should not be used by  pregnant women.   Spironolactone can cause increased urination and cause blood pressure to decrease. Please watch for signs of lightheadedness and be cautious when changing position. It can sometimes cause breast tenderness or an irregular period in premenopausal women. It can also increase potassium. The increase in potassium usually is not a concern unless you are taking other medicines that also increase potassium, so please be sure your doctor knows all of the other medications you are taking. This medication should not be taken by pregnant women.  This medicine should also not be taken together with sulfa drugs like Bactrim (trimethoprim/sulfamethexazole).      Related Medications Clindamycin-Benzoyl Per, Refr, (DUAC) gel Apply to face qam, wash off qhs  Adapalene (DIFFERIN) 0.3 % gel Apply to face qhs, wash off qam  spironolactone (ALDACTONE) 50 MG tablet Take 1 tablet (50 mg total) by mouth daily.   Return in about 3 months (around 09/27/2022) for Acne.  IJamesetta Orleans, CMA, am acting as scribe for Brendolyn Patty, MD .  Documentation: I have reviewed the above documentation for accuracy and completeness, and I agree with the above.  Brendolyn Patty MD

## 2022-06-29 ENCOUNTER — Encounter: Payer: Self-pay | Admitting: Physician Assistant

## 2022-07-03 ENCOUNTER — Encounter: Payer: Self-pay | Admitting: Physician Assistant

## 2022-07-03 NOTE — Progress Notes (Unsigned)
Amanda [name] Phone: [phone] Subjective:   Amanda [name], am serving as a scribe for Dr. Hulan Saas.   I'm seeing this patient by the request  of:  [name], PA-C  CC: Neck pain, headache, back pain  HYI:FOYDXAJOIN  Amanda [name] is a 26 y.o. female coming in with complaint of back and neck pain. OMT 05/16/2022. Patient states that her neck pain is worse since last visit. Migraine episode for past 2 weeks. Cannot think of what made her pain worse.   Medications patient has been prescribed: None  Taking:         Reviewed prior external information including notes and imaging from previsou exam, outside providers and external EMR if available.   As well as notes that were available from care everywhere and other healthcare systems.  Past medical history, social, surgical and family history all reviewed in electronic medical record.  [name] pertanent information unless stated regarding to the chief complaint.   Past Medical History:  Diagnosis Date   BRCA negative 09/2020   MyRisk neg except AXIN2 VUS   Family history of adverse reaction to anesthesia    heart condition   Family history of breast cancer 09/2020   IBIS=15.7%/riskscore=14.7%   Family history of pancreatic cancer    GERD (gastroesophageal reflux disease)    Migraine     Allergies  Allergen Reactions   Imitrex [Sumatriptan] Swelling    Facial swelling and burning lips   Penicillins Hives    Did it involve swelling of the face/tongue/throat, SOB, or low BP? [name] Did it involve sudden or severe rash/hives, skin peeling, or any reaction on the inside of your mouth or nose? Yes Did you need to seek medical attention at a hospital or doctor's office? Yes When did it last happen?      childhood allergy If all above answers are "[name]", may proceed with cephalosporin use.     Review of Systems:  [name] headache, visual changes, nausea,  vomiting, diarrhea, constipation, dizziness, abdominal pain, skin rash, fevers, chills, night sweats, weight loss, swollen lymph nodes, body aches, joint swelling, chest pain, shortness of breath, mood changes. POSITIVE muscle aches  Objective  Blood pressure 104/76, height 5' 4"  (1.626 m), weight 164 lb (74.4 kg).   General: [name] alert and oriented x3 mood and affect normal, dressed appropriately.  HEENT: Pupils equal, extraocular movements intact patient does have significant enlargement of her thyroid and seems to be a little asymmetric with more of the left side being greater than right side. Patient also has fullness of the maxillary sinus left greater than right.  Positive pain to percussion. Respiratory: Patient's speak in full sentences and does not appear short of breath  Cardiovascular: [name] lower extremity edema, non tender, [name] erythema  GaiMSK:  Back does have tightness noted in the thoracolumbar junction.  [name] tenderness over the sacroiliac joint right greater than left.  Significant tightness of the neck with flexion and extension noted as well.  Osteopathic findings  C2 flexed rotated and side bent right C6 flexed rotated and side bent left T3 extended rotated and side bent right inhaled rib T9 extended rotated and side bent left L2 flexed rotated and side bent right Sacrum right on right       Assessment and Plan:  Cervicogenic headache Patient is having some migraines at this time as well.  Patient does have a joint of the thyroid and  does have a strong family history of thyroid problems.  Questionable thyroiditis.  Labs ordered today.  Ultrasound ordered to rule out any type of nodules that could be also contributing.  We discussed with patient about icing regimen and home exercises of the neck to work on posture and ergonomics.  Follow-up with me again 5 to 6 weeks  Sinusitis Patient does have significant fullness noted of the left maxillary area with  tenderness to palpation.  We will treat with doxycycline for the possibility of a sinusitis as well.  Patient is following up with primary care provider in the future.  Enlarged thyroid Ultrasound and lab work pending    Nonallopathic problems  Decision today to treat with OMT was based on Physical Exam  After verbal consent patient was treated with HVLA, ME, FPR techniques in cervical, rib, thoracic, lumbar, and sacral  areas  Patient tolerated the procedure well with improvement in symptoms  Patient given exercises, stretches and lifestyle modifications  See medications in patient instructions if given  Patient will follow up in 4-8 weeks    The above documentation has been reviewed and is accurate and complete [name], DO          Note: This dictation was prepared with Dragon dictation along with smaller phrase technology. Any transcriptional errors that result from this process are unintentional.

## 2022-07-04 ENCOUNTER — Encounter: Payer: Medicaid Other | Admitting: Physician Assistant

## 2022-07-04 ENCOUNTER — Ambulatory Visit (INDEPENDENT_AMBULATORY_CARE_PROVIDER_SITE_OTHER): Payer: Medicaid Other | Admitting: Family Medicine

## 2022-07-04 ENCOUNTER — Ambulatory Visit
Admission: RE | Admit: 2022-07-04 | Discharge: 2022-07-04 | Disposition: A | Discharge status: Home / Self Care | Payer: Medicaid Other | Source: Ambulatory Visit | Attending: Family Medicine | Admitting: Family Medicine

## 2022-07-04 ENCOUNTER — Other Ambulatory Visit: Payer: Medicaid Other

## 2022-07-04 ENCOUNTER — Encounter: Payer: Self-pay | Admitting: Family Medicine

## 2022-07-04 VITALS — BP 104/76 | Ht 64.0 in | Wt 164.0 lb

## 2022-07-04 DIAGNOSIS — G4486 Cervicogenic headache: Secondary | ICD-10-CM

## 2022-07-04 DIAGNOSIS — M9908 Segmental and somatic dysfunction of rib cage: Secondary | ICD-10-CM

## 2022-07-04 DIAGNOSIS — M9901 Segmental and somatic dysfunction of cervical region: Secondary | ICD-10-CM | POA: Diagnosis not present

## 2022-07-04 DIAGNOSIS — J0100 Acute maxillary sinusitis, unspecified: Secondary | ICD-10-CM

## 2022-07-04 DIAGNOSIS — M9902 Segmental and somatic dysfunction of thoracic region: Secondary | ICD-10-CM

## 2022-07-04 DIAGNOSIS — E049 Nontoxic goiter, unspecified: Secondary | ICD-10-CM | POA: Diagnosis not present

## 2022-07-04 DIAGNOSIS — M9903 Segmental and somatic dysfunction of lumbar region: Secondary | ICD-10-CM

## 2022-07-04 DIAGNOSIS — M2550 Pain in unspecified joint: Secondary | ICD-10-CM | POA: Diagnosis not present

## 2022-07-04 DIAGNOSIS — M9904 Segmental and somatic dysfunction of sacral region: Secondary | ICD-10-CM | POA: Diagnosis not present

## 2022-07-04 DIAGNOSIS — E010 Iodine-deficiency related diffuse (endemic) goiter: Secondary | ICD-10-CM | POA: Insufficient documentation

## 2022-07-04 DIAGNOSIS — J329 Chronic sinusitis, unspecified: Secondary | ICD-10-CM | POA: Insufficient documentation

## 2022-07-04 LAB — COMPREHENSIVE METABOLIC PANEL
ALT: 5 U/L (ref 0–35)
AST: 15 U/L (ref 0–37)
Albumin: 4.5 g/dL (ref 3.5–5.2)
Alkaline Phosphatase: 54 U/L (ref 39–117)
BUN: 13 mg/dL (ref 6–23)
CO2: 27 mEq/L (ref 19–32)
Calcium: 9.5 mg/dL (ref 8.4–10.5)
Chloride: 104 mEq/L (ref 96–112)
Creatinine, Ser: 0.74 mg/dL (ref 0.40–1.20)
GFR: 113.27 mL/min (ref >60.00)
Glucose, Bld: 79 mg/dL (ref 70–99)
Potassium: 3.7 mEq/L (ref 3.5–5.1)
Sodium: 136 mEq/L (ref 135–145)
Total Bilirubin: 0.6 mg/dL (ref 0.2–1.2)
Total Protein: 7.4 g/dL (ref 6.0–8.3)

## 2022-07-04 LAB — T4, FREE: Free T4: 0.88 ng/dL (ref 0.60–1.60)

## 2022-07-04 LAB — CBC WITH DIFFERENTIAL/PLATELET
Basophils Absolute: 0 10*3/uL (ref 0.0–0.1)
Basophils Relative: 0.2 % (ref 0.0–3.0)
Eosinophils Absolute: 0.1 10*3/uL (ref 0.0–0.7)
Eosinophils Relative: 0.8 % (ref 0.0–5.0)
HCT: 38.5 % (ref 36.0–46.0)
Hemoglobin: 12.9 g/dL (ref 12.0–15.0)
Lymphocytes Relative: 20.5 % (ref 12.0–46.0)
Lymphs Abs: 1.7 10*3/uL (ref 0.7–4.0)
MCHC: 33.4 g/dL (ref 30.0–36.0)
MCV: 87.4 fl (ref 78.0–100.0)
Monocytes Absolute: 0.5 10*3/uL (ref 0.1–1.0)
Monocytes Relative: 6.4 % (ref 3.0–12.0)
Neutro Abs: 5.9 10*3/uL (ref 1.4–7.7)
Neutrophils Relative %: 72.1 % (ref 43.0–77.0)
Platelets: 237 10*3/uL (ref 150.0–400.0)
RBC: 4.4 Mil/uL (ref 3.87–5.11)
RDW: 12.2 % (ref 11.5–15.5)
WBC: 8.1 10*3/uL (ref 4.0–10.5)

## 2022-07-04 LAB — T3, FREE: T3, Free: 3.2 pg/mL (ref 2.3–4.2)

## 2022-07-04 LAB — TSH: TSH: 1.78 u[IU]/mL (ref 0.35–5.50)

## 2022-07-04 LAB — SEDIMENTATION RATE: Sed Rate: 8 mm/hr (ref 0–20)

## 2022-07-04 MED ORDER — KETOROLAC TROMETHAMINE 30 MG/ML IJ SOLN
30.0000 mg | Freq: Once | INTRAMUSCULAR | Status: AC
  Administered 2022-07-04: 30 mg via INTRAMUSCULAR

## 2022-07-04 MED ORDER — METHYLPREDNISOLONE ACETATE 40 MG/ML IJ SUSP
40.0000 mg | Freq: Once | INTRAMUSCULAR | Status: AC
  Administered 2022-07-04: 40 mg via INTRAMUSCULAR

## 2022-07-04 MED ORDER — DOXYCYCLINE HYCLATE 100 MG PO TABS: 100.0000 mg | ORAL_TABLET | Freq: Two times a day (BID) | ORAL | 0 refills | Status: AC

## 2022-07-04 NOTE — Patient Instructions (Addendum)
Good to see you! Labs today  [facility] [phone] Call Today  When we receive your results we will contact you.  See you again in 5-6 weeks

## 2022-07-04 NOTE — Assessment & Plan Note (Signed)
Patient is having some migraines at this time as well.  Patient does have a joint of the thyroid and does have a strong family history of thyroid problems.  Questionable thyroiditis.  Labs ordered today.  Ultrasound ordered to rule out any type of nodules that could be also contributing.  We discussed with patient about icing regimen and home exercises of the neck to work on posture and ergonomics.  Follow-up with me again 5 to 6 weeks

## 2022-07-04 NOTE — Assessment & Plan Note (Signed)
Patient does have significant fullness noted of the left maxillary area with tenderness to palpation.  We will treat with doxycycline for the possibility of a sinusitis as well.  Patient is following up with primary care provider in the future.

## 2022-07-04 NOTE — Assessment & Plan Note (Signed)
Ultrasound and lab work pending

## 2022-07-07 ENCOUNTER — Encounter: Payer: Self-pay | Admitting: Physician Assistant

## 2022-07-07 ENCOUNTER — Other Ambulatory Visit: Payer: Self-pay | Admitting: Family Medicine

## 2022-07-07 ENCOUNTER — Ambulatory Visit (INDEPENDENT_AMBULATORY_CARE_PROVIDER_SITE_OTHER): Payer: Medicaid Other | Admitting: Physician Assistant

## 2022-07-07 VITALS — BP 122/77 | HR 120 | Wt 164.0 lb

## 2022-07-07 DIAGNOSIS — M542 Cervicalgia: Secondary | ICD-10-CM | POA: Diagnosis not present

## 2022-07-07 DIAGNOSIS — Z0000 Encounter for general adult medical examination without abnormal findings: Secondary | ICD-10-CM

## 2022-07-07 DIAGNOSIS — R232 Flushing: Secondary | ICD-10-CM | POA: Diagnosis not present

## 2022-07-07 DIAGNOSIS — E010 Iodine-deficiency related diffuse (endemic) goiter: Secondary | ICD-10-CM

## 2022-07-07 DIAGNOSIS — R768 Other specified abnormal immunological findings in serum: Secondary | ICD-10-CM

## 2022-07-07 LAB — ANTI-NUCLEAR AB-TITER (ANA TITER): ANA Titer 1: 1:640 {titer} — ABNORMAL HIGH

## 2022-07-07 LAB — ANA: Anti Nuclear Antibody (ANA): POSITIVE — AB

## 2022-07-07 MED ORDER — METHYLPREDNISOLONE 4 MG PO TBPK: ORAL_TABLET | ORAL | 0 refills | Status: DC

## 2022-07-07 NOTE — Progress Notes (Unsigned)
I,[name],acting as a Education administrator for [employer], PA-C.,have documented all relevant documentation on the behalf of Amanda Kirschner, PA-C,as directed by  Amanda Kirschner, PA-C while in the presence of Amanda Kirschner, PA-C.   Complete physical exam   Patient: Amanda [name]   DOB: May 30, 1996   26 y.o. Female  MRN: [id number] Visit Date: 07/07/2022  Today's healthcare provider: [name], PA-C   No chief complaint on file.  Subjective    Amanda [name] is a 26 y.o. female who presents today for a complete physical exam.  She reports consuming a general diet.  The patient reports doing exercises at home for at least 30 minutes when she is able too.  She generally feels fairly well. She reports sleeping fairly well. She does have additional problems to discuss today.  HPI  Wanted to go over labs that were done on 07/04/22 wit her sports medicine doctor. Also states she was supposed to receive a steroid for anti-inflammatory but never received it.   Past Medical History:  Diagnosis Date   BRCA negative 09/2020   MyRisk neg except AXIN2 VUS   Family history of adverse reaction to anesthesia    heart condition   Family history of breast cancer 09/2020   IBIS=15.7%/riskscore=14.7%   Family history of pancreatic cancer    GERD (gastroesophageal reflux disease)    Migraine    Past Surgical History:  Procedure Laterality Date   NO PAST SURGERIES     WISDOM TOOTH EXTRACTION     Social History   Socioeconomic History   Marital status: Single    Spouse name: Physicist, medical   Number of children: 2   Years of education: Not on file   Highest education level: Associate degree: occupational, Hotel manager, or vocational program  Occupational History   Occupation: Massage Therapy  Tobacco Use   Smoking status: Never   Smokeless tobacco: Never  Vaping Use   Vaping Use: Never used  Substance and Sexual Activity   Alcohol use: Never   Drug use: No   Sexual activity: Yes     Partners: Male    Birth control/protection: None  Other Topics Concern   Not on file  Social History Narrative   ** Merged History Encounter **       One story home Right-handed Caffeine: occasional coffee   Social Determinants of Health   Financial Resource Strain: Not on file  Food Insecurity: Not on file  Transportation Needs: Not on file  Physical Activity: Not on file  Stress: Stress Concern Present (12/01/2021)   [place]    Feeling of Stress : Rather much  Social Connections: Not on file  Intimate Partner Violence: Not on file   Family Status  Relation Name Status   Mother  Alive   Father  Alive   MGM  Alive   MGF  Deceased   Walker  Alive   PGF  Alive   Mat Aunt  Alive   Brother  Alive   Daughter  Alive   Daughter  Alive   Family History  Problem Relation Age of Onset   Migraines Mother    Skin cancer Father    Asthma Maternal Grandmother    Hypertension Maternal Grandmother    Thyroid disease Maternal Grandmother    Hypertension Maternal Grandfather    Pancreatic cancer Maternal Grandfather 34   Colon cancer Maternal Grandfather 60   Hypertension Paternal Grandmother    Diabetes Paternal  Grandmother    Hypertension Paternal Grandfather    Diabetes Paternal Grandfather    Colon cancer Paternal Grandfather 50   Thyroid disease Maternal Aunt    Breast cancer Maternal Aunt 45   Allergies Brother    Allergies  Allergen Reactions   Imitrex [Sumatriptan] Swelling    Facial swelling and burning lips   Penicillins Hives    Did it involve swelling of the face/tongue/throat, SOB, or low BP? No Did it involve sudden or severe rash/hives, skin peeling, or any reaction on the inside of your mouth or nose? Yes Did you need to seek medical attention at a hospital or doctor's office? Yes When did it last happen?      childhood allergy If all above answers are "NO", may proceed with cephalosporin  use.    Patient Care Team: Amanda Kirschner, PA-C as PCP - General (Physician Assistant)   Medications: Outpatient Medications Prior to Visit  Medication Sig   Adapalene (DIFFERIN) 0.3 % gel Apply to face qhs, wash off qam   azelastine (ASTELIN) 0.1 % nasal spray Place 2 sprays into both nostrils 2 (two) times daily. Use in each nostril as directed   Clindamycin-Benzoyl Per, Refr, (DUAC) gel Apply to face qam, wash off qhs   doxycycline (VIBRA-TABS) 100 MG tablet Take 1 tablet (100 mg total) by mouth 2 (two) times daily for 7 days.   escitalopram (LEXAPRO) 10 MG tablet Take 1.5 tablets (15 mg total) by mouth daily. Dose change   fluticasone (FLONASE) 50 MCG/ACT nasal spray Place 2 sprays into both nostrils daily.   gabapentin (NEURONTIN) 300 MG capsule Take 1 capsule (300 mg total) by mouth at bedtime.   hydrOXYzine (VISTARIL) 25 MG capsule TAKE 1-2 CAPSULES (25-50 MG TOTAL) BY MOUTH AT BEDTIME AS NEEDED. FOR ANXIETY AND SLEEP   spironolactone (ALDACTONE) 50 MG tablet Take 1 tablet (50 mg total) by mouth daily.   Ubrogepant (UBRELVY) 100 MG TABS Take 1 tablet by mouth as needed (May repeat after 2 hours.  Maximum 2 tablets in 24 hours.).   valACYclovir (VALTREX) 1000 MG tablet Take 1 tablet (1,000 mg total) by mouth daily.   No facility-administered medications prior to visit.    Review of Systems  {Labs  Heme  Chem  Endocrine  Serology  Results Review (optional):23779}  Objective    There were no vitals taken for this visit. {Show previous vital signs (optional):23777}   Physical Exam  ***  Last depression screening scores    06/06/2022    2:15 PM 06/01/2022    2:32 PM 03/06/2022    4:09 PM  PHQ 2/9 Scores  PHQ - 2 Score  2   PHQ- 9 Score  11      Information is confidential and restricted. Go to Review Flowsheets to unlock data.   Last fall risk screening    06/01/2022    2:32 PM  [place] in the past year? 0  Number falls in past yr: 0  Injury with  Fall? 0  Risk for fall due to : No Fall Risks   Last Audit-C alcohol use screening    06/01/2022    2:31 PM  Alcohol Use Disorder Test (AUDIT)  1. How often do you have a drink containing alcohol? 1  2. How many drinks containing alcohol do you have on a typical day when you are drinking? 0  3. How often do you have six or more drinks on one occasion? 1  AUDIT-C  Score 2   A score of 3 or more in women, and 4 or more in men indicates increased risk for alcohol abuse, EXCEPT if all of the points are from question 1   No results found for any visits on 07/07/22.  Assessment & Plan    Routine Health Maintenance and Physical Exam  Exercise Activities and Dietary recommendations  Goals   None     Immunization History  Administered Date(s) Administered   DTaP 04/15/1998, 06/10/1998, 08/12/1998, 02/17/1999, 05/15/2003   HPV Quadrivalent 09/23/2009, 11/24/2009, 09/27/2010   Hepatitis A 08/20/2007, 08/28/2008   Hepatitis B 04/15/1998, 06/10/1998, 07/21/1999   HiB (PRP-OMP) 04/15/1998, 06/10/1998, 07/21/1999   IPV 04/15/1998, 06/10/1998, 02/17/1999, 05/15/2003   Influenza Split 12/31/2005, 10/27/2008, 09/06/2012   Influenza Whole 09/30/2009   Influenza,inj,Quad PF,6+ Mos 09/11/2013, 09/04/2014, 08/20/2015, 08/29/2016, 10/04/2017, 08/20/2018, 08/13/2019, 11/03/2021   Influenza-Unspecified 09/06/2012   MMR 07/21/1999, 05/15/2003   Meningococcal Conjugate 08/20/2015   Meningococcal Polysaccharide 09/23/2009   PPD Test 03/13/2016, 03/20/2016   Td 08/28/2008   Tdap 08/28/2008, 08/22/2018, 10/21/2019   Varicella 02/17/1999, 08/20/2007    Health Maintenance  Topic Date Due   INFLUENZA VACCINE  07/18/2022   PAP-Cervical Cytology Screening  02/10/2025   PAP SMEAR-Modifier  02/10/2025   TETANUS/TDAP  10/20/2029   HPV VACCINES  Completed   Hepatitis C Screening  Completed   HIV Screening  Completed    Discussed health benefits of physical activity, and encouraged her to engage in  regular exercise appropriate for her age and condition.  ***  No follow-ups on file.     {provider attestation***:1}   Amanda Kirschner, PA-C  [facility] [phone] (phone) [phone] (fax)  [place]

## 2022-07-10 ENCOUNTER — Encounter: Payer: Self-pay | Admitting: Physician Assistant

## 2022-07-10 DIAGNOSIS — M542 Cervicalgia: Secondary | ICD-10-CM | POA: Insufficient documentation

## 2022-07-10 DIAGNOSIS — R7689 Other specified abnormal immunological findings in serum: Secondary | ICD-10-CM | POA: Insufficient documentation

## 2022-07-10 DIAGNOSIS — R232 Flushing: Secondary | ICD-10-CM | POA: Insufficient documentation

## 2022-07-10 DIAGNOSIS — R768 Other specified abnormal immunological findings in serum: Secondary | ICD-10-CM | POA: Insufficient documentation

## 2022-07-10 NOTE — Assessment & Plan Note (Signed)
Labs WNL [place] w/ mild thyromegaly.  We can continue to monitor labs annually.

## 2022-07-10 NOTE — Assessment & Plan Note (Signed)
Discussed recent labs w/ pt Advised that this is a vague inflammatory/autoimmune marker, and she will likely need more testing w/ rheumatology

## 2022-07-10 NOTE — Assessment & Plan Note (Signed)
rx medrol dose pack for neck pain/headache.  Managed by sports medicine, previously well controlled.

## 2022-07-10 NOTE — Assessment & Plan Note (Signed)
Advised she discuss with w/ rheum as well.  All labs wnl aside from ANA  Will montior

## 2022-07-11 ENCOUNTER — Ambulatory Visit (INDEPENDENT_AMBULATORY_CARE_PROVIDER_SITE_OTHER): Payer: Medicaid Other | Admitting: Licensed Clinical Social Worker

## 2022-07-11 DIAGNOSIS — F411 Generalized anxiety disorder: Secondary | ICD-10-CM | POA: Diagnosis not present

## 2022-07-11 NOTE — Progress Notes (Signed)
THERAPIST PROGRESS NOTE  [facility] in office visit for patient and LCSW clinician  Session Time: 10-11a  Participation Level: Active  Behavioral Response: NeatAlertAnxious  Type of Therapy: Individual Therapy  Treatment Goals addressed: Problem: Anxiety Disorder CCP Problem  1 Reduce overall frequency, intensity, and duration of the anxiety so that daily functioning is not impaired per pt self report 3 out of 5 sessions documented.   Goal: LTG: Patient will score less than 5 on the Generalized Anxiety Disorder 7 Scale (GAD-7) Outcome: Progressing Goal: STG: Patient will participate in at least 80% of scheduled individual psychotherapy sessions Outcome: Progressing Intervention: Encourage verbalization of feelings/concerns/expectations Intervention: Encourage self-care activities Intervention: Assist with relaxation techniques, as appropriate (deep breathing exercises, meditation, guided imagery) Intervention: REVIEW PLEASE SKILLS (TREAT PHYSICAL ILLNESS, BALANCE EATING, AVOID MOOD-ALTERING SUBSTANCES, BALANCE SLEEP AND GET EXERCISE) WITH Kasy Intervention: WORK WITH Dana TO TRACK SYMPTOMS, TRIGGERS AND/OR SKILL USE THROUGH A MOOD CHART, DIARY CARD, OR JOURNAL    ProgressTowards Goals: Progressing  Interventions: CBT  Summary: Amanda [name] is a 26 y.o. female who presents with improving symptoms related to anxiety.  Pt reports that she is compliant with medication and is managing situational stressors well.    Baby mama: not allowed to take photos with stepson or his sisters: has to have "her permission". She called me bad names. She stated that I am the reason that she and husband can't coparent. Did a photo shoot with kids.  Needed model photos for a session. She took it really hard. Kid friendly shoot. Blocks posts from baby mom on FB.   Family: father's day was tough.  "You don't love me, you don't care about me"  Me and husband trying to set boundaries with family members. "They  need to come here"   I don't know what to do or say: don't want to say anything that will hurt someone's feelings.   I am a big people pleaser+  Cry when I get mad.  Not sad, very frustrated.   Personal empowerment:   Health concerns: neck adjustment. Noticed my thyroid was enlarged. Possible autoimmune disease. Hot flashes/mood swings. Struggle with weight. Not happening. Migraines getting worse even with meds. Seeing specialist. Need tonsils taken out. Tonsil stones most days.   Fiance: helps with the kids when he is here. He works a lot.  What I do pays some of the bills but he pays for the house and vehicles. Planning a wedding==we are paying for it ourselves. "He is the only person that will help me" mom has agreed to take off work.   "I think its stress and anxiety"   Kids go with me when I clean a house. They will stay with my MIL for a few hours.   Juggle things in the house. Hard to do things when stepson is here. I do notice that I am on the edge when he is here. I don't know why.   "I get blamed for everything that goes wrong". Kid had nightmares   Kid categoriezes "poor" and "rich"           Allowed pt to explore and express thoughts and feelings associated with recent life situations and external stressors.   In laws.    Allowed pt to explore some conflicts that have happened due to her blended family--stepson's bio mom and his relationship with other siblings versus pts 2yo twins and relationship with them.   Reviewed coping skills to help manage stress, feelings of being  overwhelmed, and importance of self care behaviors.    Suicidal/Homicidal: No  Therapist Response: Pt is continuing to apply interventions learned in session into daily life situations. Pt is currently on track to meet goals utilizing interventions mentioned above. Personal growth and progress noted. Treatment to continue as indicated.    Plan: Return again in 4 weeks.  Diagnosis: No  diagnosis found.  Collaboration of Care: Other pt encouraged to continue with psychiatrist of record, Dr. Ursula Alert  Patient/Guardian was advised Release of Information must be obtained prior to any record release in order to collaborate their care with an outside provider. Patient/Guardian was advised if they have not already done so to contact the registration department to sign all necessary forms in order for [place] to release information regarding their care.   Consent: Patient/Guardian gives verbal consent for treatment and assignment of benefits for services provided during this visit. Patient/Guardian expressed understanding and agreed to proceed.   [place], LCSW 07/11/2022

## 2022-07-13 ENCOUNTER — Other Ambulatory Visit: Payer: Self-pay | Admitting: Psychiatry

## 2022-07-13 DIAGNOSIS — F3342 Major depressive disorder, recurrent, in full remission: Secondary | ICD-10-CM

## 2022-07-13 DIAGNOSIS — F411 Generalized anxiety disorder: Secondary | ICD-10-CM

## 2022-07-16 ENCOUNTER — Encounter: Payer: Self-pay | Admitting: Physician Assistant

## 2022-07-17 ENCOUNTER — Other Ambulatory Visit: Payer: Self-pay

## 2022-07-17 DIAGNOSIS — R768 Other specified abnormal immunological findings in serum: Secondary | ICD-10-CM

## 2022-07-21 ENCOUNTER — Encounter: Payer: Self-pay | Admitting: Psychiatry

## 2022-07-21 ENCOUNTER — Telehealth: Payer: Self-pay

## 2022-07-21 ENCOUNTER — Telehealth (INDEPENDENT_AMBULATORY_CARE_PROVIDER_SITE_OTHER): Payer: Medicaid Other | Admitting: Psychiatry

## 2022-07-21 DIAGNOSIS — F411 Generalized anxiety disorder: Secondary | ICD-10-CM

## 2022-07-21 DIAGNOSIS — F3342 Major depressive disorder, recurrent, in full remission: Secondary | ICD-10-CM | POA: Diagnosis not present

## 2022-07-21 DIAGNOSIS — G4700 Insomnia, unspecified: Secondary | ICD-10-CM | POA: Diagnosis not present

## 2022-07-21 MED ORDER — DOXEPIN HCL 3 MG PO TABS: 3.0000 mg | ORAL_TABLET | Freq: Every evening | ORAL | 1 refills | Status: DC | PRN

## 2022-07-21 MED ORDER — TRAZODONE HCL 50 MG PO TABS: 25.0000 mg | ORAL_TABLET | Freq: Every evening | ORAL | 1 refills | Status: DC | PRN

## 2022-07-21 NOTE — Telephone Encounter (Signed)
Spoke to patient.  Discussed starting trazodone 25 mg at bedtime.  Will send a new prescription to pharmacy.  Provided medication education.

## 2022-07-21 NOTE — Telephone Encounter (Signed)
Denied. The drug that you asked for is not covered.   We are denying your request because we do not show that you have tried at least 2 preferred drugs. Other covered drug(s) is/are: Flurazepam capsule '15mg'$ , '30mg'$ , Temazepam capsule '15mg'$ , '30mg'$ , Zolpidem tablet '5mg'$ , '10mg'$ . Note: Some covered drug(s) may have limits on the quantity you can get. You can get this information on the list of covered drugs (Preferred Drug List) for details.

## 2022-07-21 NOTE — Progress Notes (Signed)
Virtual Visit via Video Note  I connected with Amanda [name] on 07/21/22 at 10:20 AM EDT by a video enabled telemedicine application and verified that I am speaking with the correct person using two identifiers.  Location Provider Location : [facility] Patient Location : Home  Participants: Patient , Provider   I discussed the limitations of evaluation and management by telemedicine and the availability of in person appointments. The patient expressed understanding and agreed to proceed.   I discussed the assessment and treatment plan with the patient. The patient was provided an opportunity to ask questions and all were answered. The patient agreed with the plan and demonstrated an understanding of the instructions.   The patient was advised to call back or seek an in-person evaluation if the symptoms worsen or if the condition fails to improve as anticipated.   [facility] MD OP Progress Note  07/21/2022 12:42 PM Amanda [name]  MRN:  [id number]  Chief Complaint:  Chief Complaint  Patient presents with   Follow-up: 26 year old female with history of anxiety, depression, worsening sleep problems presented for medication management   HPI: Amanda [name] is a 26 year old female, engaged, self-employed, lives in [place], has a history of GAD, MDD, was evaluated by telemedicine today.  Patient today reports she has been managing her anxiety symptoms better than before.  The higher dosage of Lexapro 15 mg has been beneficial.  Denies side effects.  She continues to follow-up with her therapist and therapy sessions are helpful.  Patient reports she is currently struggling with sleep problems.  She reports she has difficulty falling asleep.  Tried hydroxyzine however it does not help.  She falls asleep around midnight.  She tries to wake up at around 6 AM in the morning so she can get some home chores out of the way before her kids are awake.  She hence reports she has not been getting enough sleep.   Agreeable to trial of low dosage doxepin.  Patient denies any suicidality, homicidality or perceptual disturbances.  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  Has good support system from her spouse.  Recently was diagnosed with an enlarged thyroid, has upcoming appointment with rheumatologist coming up, likely has autoimmune disease per her recent workup.  That has been anxiety provoking for her.  Patient denies any other concerns today.    Visit Diagnosis:    ICD-10-CM   1. GAD (generalized anxiety disorder)  F41.1     2. MDD (major depressive disorder), recurrent, in full remission ([place])  F33.42     3. Insomnia, unspecified type  G47.00 Doxepin HCl 3 MG TABS      Past Psychiatric History: Reviewed past psychiatric history from progress note on 02/02/2022.  Past trials of Zoloft-did not work, Effexor-made her suicidal.   Past Medical History:  Past Medical History:  Diagnosis Date   BRCA negative 09/2020   MyRisk neg except AXIN2 VUS   Family history of adverse reaction to anesthesia    heart condition   Family history of breast cancer 09/2020   IBIS=15.7%/riskscore=14.7%   Family history of pancreatic cancer    GERD (gastroesophageal reflux disease)    Migraine     Past Surgical History:  Procedure Laterality Date   NO PAST SURGERIES     WISDOM TOOTH EXTRACTION      Family Psychiatric History: Reviewed family psychiatric history from progress note on 02/02/2022.  Family History:  Family History  Problem Relation Age of Onset  Migraines Mother    Skin cancer Father    Asthma Maternal Grandmother    Hypertension Maternal Grandmother    Thyroid disease Maternal Grandmother    Hypertension Maternal Grandfather    Pancreatic cancer Maternal Grandfather 44   Colon cancer Maternal Grandfather 60   Hypertension Paternal Grandmother    Diabetes Paternal Grandmother    Hypertension Paternal Grandfather    Diabetes Paternal Grandfather     Colon cancer Paternal Grandfather 89   Thyroid disease Maternal Aunt    Breast cancer Maternal Aunt 32   Allergies Brother     Social History: Reviewed social history from progress note on 02/02/2022. Social History   Socioeconomic History   Marital status: Single    Spouse name: Amanda [name]   Number of children: 2   Years of education: Not on file   Highest education level: Associate degree: occupational, Hotel manager, or vocational program  Occupational History   Occupation: Massage Therapy  Tobacco Use   Smoking status: Never   Smokeless tobacco: Never  Vaping Use   Vaping Use: Never used  Substance and Sexual Activity   Alcohol use: Never   Drug use: No   Sexual activity: Yes    Partners: Male    Birth control/protection: None  Other Topics Concern   Not on file  Social History Narrative   ** Merged History Encounter **       One story home Right-handed Caffeine: occasional coffee   Social Determinants of Health   Financial Resource Strain: Not on file  Food Insecurity: Not on file  Transportation Needs: Not on file  Physical Activity: Not on file  Stress: Stress Concern Present (12/01/2021)   [employer] [place] of Stress : Rather much  Social Connections: Not on file    Allergies:  Allergies  Allergen Reactions   Imitrex [Sumatriptan] Swelling    Facial swelling and burning lips   Penicillins Hives    Did it involve swelling of the face/tongue/throat, SOB, or low BP? No Did it involve sudden or severe rash/hives, skin peeling, or any reaction on the inside of your mouth or nose? Yes Did you need to seek medical attention at a hospital or doctor's office? Yes When did it last happen?      childhood allergy If all above answers are "NO", may proceed with cephalosporin use.    Metabolic Disorder Labs: Lab Results  Component Value Date   HGBA1C 5.3 01/07/2015   No results found for:  "PROLACTIN" Lab Results  Component Value Date   CHOL 135 06/28/2020   TRIG 54 06/28/2020   HDL 54 06/28/2020   LDLCALC 69 06/28/2020   Lab Results  Component Value Date   TSH 1.78 07/04/2022   TSH 1.600 03/08/2022    Therapeutic Level Labs: No results found for: "LITHIUM" No results found for: "VALPROATE" No results found for: "CBMZ"  Current Medications: Current Outpatient Medications  Medication Sig Dispense Refill   Adapalene (DIFFERIN) 0.3 % gel Apply to face qhs, wash off qam 45 g 2   Clindamycin-Benzoyl Per, Refr, (DUAC) gel Apply to face qam, wash off qhs 45 g 2   Doxepin HCl 3 MG TABS Take 1 tablet (3 mg total) by mouth at bedtime as needed. For sleep 30 tablet 1   escitalopram (LEXAPRO) 10 MG tablet Take 1.5 tablets (15 mg total) by mouth daily. Dose change 135 tablet 0   gabapentin (NEURONTIN) 300 MG capsule  Take 1 capsule (300 mg total) by mouth at bedtime. 30 capsule 5   hydrOXYzine (VISTARIL) 25 MG capsule TAKE 1-2 CAPSULES (25-50 MG TOTAL) BY MOUTH AT BEDTIME AS NEEDED. FOR ANXIETY AND SLEEP 180 capsule 0   spironolactone (ALDACTONE) 50 MG tablet Take 1 tablet (50 mg total) by mouth daily. 30 tablet 3   Ubrogepant (UBRELVY) 100 MG TABS Take 1 tablet by mouth as needed (May repeat after 2 hours.  Maximum 2 tablets in 24 hours.). 10 tablet 5   valACYclovir (VALTREX) 1000 MG tablet Take 1 tablet (1,000 mg total) by mouth daily. 90 tablet 2   methylPREDNISolone (MEDROL DOSEPAK) 4 MG TBPK tablet Take 6 pills on day 1, 5 pills on day 2, 4 pills on day 3, 3 pills on day 4, 2 pills on day 5, 1 pill on day 6 (Patient not taking: Reported on 07/21/2022) 1 each 0   No current facility-administered medications for this visit.     Musculoskeletal: Strength & Muscle Tone:  UTA Gait & Station:  Seated Patient leans: N/A  Psychiatric Specialty Exam: Review of Systems  Psychiatric/Behavioral:  Positive for sleep disturbance. The patient is nervous/anxious.   All other systems  reviewed and are negative.   Last menstrual period 06/14/2022.There is no height or weight on file to calculate BMI.  General Appearance: Casual  Eye Contact:  Fair  Speech:  Clear and Coherent  Volume:  Normal  Mood:  Anxious  Affect:  Congruent  Thought Process:  Goal Directed and Descriptions of Associations: Intact  Orientation:  Full (Time, Place, and Person)  Thought Content: Logical   Suicidal Thoughts:  No  Homicidal Thoughts:  No  Memory:  Immediate;   Fair Recent;   Fair Remote;   Fair  Judgement:  Fair  Insight:  Fair  Psychomotor Activity:  Normal  Concentration:  Concentration: Fair and Attention Span: Fair  Recall:  [employer] of Knowledge: Fair  Language: Fair  Akathisia:  No  Handed:  Right  AIMS (if indicated): done  Assets:  Communication Skills Desire for Improvement Housing Intimacy Social Support  ADL's:  Intact  Cognition: WNL  Sleep:  Poor   Screenings: AIMS    Flowsheet Row Video Visit from 07/21/2022 in [facility] Total Score 0      GAD-7    Flowsheet Row Video Visit from 07/21/2022 in [facility] Video Visit from 06/06/2022 in [place] [place] Office Visit from 06/01/2022 in [facility] Video Visit from 03/06/2022 in [facility] Counselor from 02/16/2022 in [facility]  Total GAD-7 Score _0 PHQ2-9    Flowsheet Row Video Visit from 07/21/2022 in [facility] [place] Video Visit from 06/06/2022 in [place] Office Visit from 06/01/2022 in [facility] Video Visit from 03/06/2022 in [place] Office Visit from 02/28/2022 in [place]  PHQ-2 Total Score 1 0 2 0 3  PHQ-9 Total Score -- -- _1 Flowsheet Row Video Visit from 07/21/2022 in [facility] [place] Counselor from 07/11/2022 in [place] Video Visit from 06/06/2022 in [place] No Risk No Risk No Risk        Assessment and Plan: LARUTH HANGER is a 26 year old female, engaged, lives in [place], has a history of  GAD, MDD was evaluated by telemedicine today.  Patient with anxiety, sleep problems, will benefit from the following plan.  Plan GAD-improving Lexapro 15 mg p.o. daily Continue CBT with Ms. [name] Hussami Hydroxyzine 25-50 mg at bedtime as needed.  MDD in remission Lexapro 15 mg p.o. daily  Insomnia-unstable Discussed sleep hygiene techniques. Start doxepin 3 mg p.o. nightly.  Discussed drug to drug interaction, adverse side effects, serotonin syndrome.  Follow-up in clinic in 6 weeks or sooner if needed.    Collaboration of Care: Collaboration of Care: Referral or follow-up with counselor/therapist [facility] encouraged to continue psychotherapy sessions.  Patient/Guardian was advised Release of Information must be obtained prior to any record release in order to collaborate their care with an outside provider. Patient/Guardian was advised if they have not already done so to contact the registration department to sign all necessary forms in order for [place] to release information regarding their care.   Consent: Patient/Guardian gives verbal consent for treatment and assignment of benefits for services provided during this visit. Patient/Guardian expressed understanding and agreed to proceed.   This note was generated in part or whole with voice recognition software. Voice recognition is usually quite accurate but there are transcription errors that can and very often do occur. I apologize for any typographical errors that were not detected and corrected.      Ursula Alert, MD 07/21/2022, 12:42 PM

## 2022-07-21 NOTE — Telephone Encounter (Signed)
went online to [email or web address] and submitted the prior auth . - pending 

## 2022-07-21 NOTE — Telephone Encounter (Signed)
received fax requesting a prior auth for the doxepin hcl '3mg'$ 

## 2022-07-21 NOTE — Patient Instructions (Signed)
Doxepin Capsules What is this medication? DOXEPIN (DOX e pin) treats depression and anxiety. It increases the amount of serotonin and norepinephrine in the brain, hormones that help regulate mood. It belongs to a group of medications called tricyclic antidepressants (TCAs). This medicine may be used for other purposes; ask your health care provider or pharmacist if you have questions. COMMON BRAND NAME(S): Sinequan What should I tell my care team before I take this medication? They need to know if you have any of these conditions: Bipolar disorder Difficulty passing urine Glaucoma Heart disease If you frequently drink alcohol containing drinks Liver disease Lung or breathing disease, like asthma or sleep apnea Prostate trouble Schizophrenia Seizures Suicidal thoughts, plans, or attempt; a previous suicide attempt by you or a family member An unusual or allergic reaction to doxepin, other medications, foods, dyes, or preservatives Pregnant or trying to get pregnant Breast-feeding How should I use this medication? Take this medication by mouth with a glass of water. Follow the directions on the prescription label. Take your doses at regular intervals. Do not take your medication more often than directed. Do not stop taking this medication suddenly except upon the advice of your care team. Stopping this medication too quickly may cause serious side effects or your condition may worsen. A special MedGuide will be given to you by the pharmacist with each prescription and refill. Be sure to read this information carefully each time. Talk to your care team about the use of this medication in children. While this medication may be prescribed for children as young as 12 years for selected conditions, precautions do apply. Overdosage: If you think you have taken too much of this medicine contact a poison control center or emergency room at once. NOTE: This medicine is only for you. Do not share this  medicine with others. What if I miss a dose? If you miss a dose, take it as soon as you can. If it is almost time for your next dose, take only that dose. Do not take double or extra doses. What may interact with this medication? Do not take this medication with any of the following: Arsenic trioxide Certain medications used to regulate abnormal heartbeat or to treat other heart conditions Cisapride Halofantrine Levomethadyl Linezolid MAOIs like Carbex, Eldepryl, Marplan, Nardil, and Parnate Methylene blue Other medications for mental depression Phenothiazines like perphenazine, thioridazine and chlorpromazine Pimozide Procarbazine Sparfloxacin St. John's Wort This medication may also interact with the following: Cimetidine Tolazamide Ziprasidone This list may not describe all possible interactions. Give your health care provider a list of all the medicines, herbs, non-prescription drugs, or dietary supplements you use. Also tell them if you smoke, drink alcohol, or use illegal drugs. Some items may interact with your medicine. What should I watch for while using this medication? Visit your care team for regular checks on your progress. It can take several days before you feel the full effect of this medication. If you have been taking this medication regularly for some time, do not suddenly stop taking it. You must gradually reduce the dose or you may get severe side effects. Ask your care team for advice. Even after you stop taking this medication it can still affect your body for several days. Patients and their families should watch out for new or worsening thoughts of suicide or depression. Also watch out for sudden changes in feelings such as feeling anxious, agitated, panicky, irritable, hostile, aggressive, impulsive, severely restless, overly excited and hyperactive, or not being able  to sleep. If this happens, especially at the beginning of treatment or after a change in dose,  call your care team. You may get drowsy or dizzy. Do not drive, use machinery, or do anything that needs mental alertness until you know how this medication affects you. Do not stand or sit up quickly, especially if you are an older patient. This reduces the risk of dizzy or fainting spells. Alcohol may increase dizziness and drowsiness. Avoid alcoholic drinks. Do not treat yourself for coughs, colds, or allergies without asking your care team for advice. Some ingredients can increase possible side effects. Your mouth may get dry. Chewing sugarless gum or sucking hard candy, and drinking plenty of water may help. Contact your care team if the problem does not go away or is severe. This medication may cause dry eyes and blurred vision. If you wear contact lenses you may feel some discomfort. Lubricating drops may help. See your care team if the problem does not go away or is severe. This medication can make you more sensitive to the sun. Keep out of the sun. If you cannot avoid being in the sun, wear protective clothing and use sunscreen. Do not use sun lamps or tanning beds/booths. What side effects may I notice from receiving this medication? Side effects that you should report to your care team as soon as possible: Allergic reactions--skin rash, itching, hives, swelling of the face, lips, tongue, or throat Irritability, confusion, fast or irregular heartbeat, muscle stiffness, twitching muscles, sweating, high fever, seizure, chills, vomiting, diarrhea, which may be signs of serotonin syndrome Sudden eye pain or change in vision such as blurry vision, seeing halos around lights, vision loss Thoughts of suicide or self-harm, worsening mood, or feelings of depression Trouble passing urine Side effects that usually do not require medical attention (report to your care team if they continue or are bothersome): Change in sex drive or performance Constipation Dizziness Drowsiness Dry  mouth Tremors Weight gain This list may not describe all possible side effects. Call your doctor for medical advice about side effects. You may report side effects to FDA at 1-800-FDA-1088. Where should I keep my medication? Keep out of the reach of children. Store at room temperature between 15 and 30 degrees C (59 and 86 degrees F). Throw away any unused medication after the expiration date. NOTE: This sheet is a summary. It may not cover all possible information. If you have questions about this medicine, talk to your doctor, pharmacist, or health care provider.  2023 Elsevier/Gold Standard (2021-02-16 00:00:00)

## 2022-07-24 NOTE — Telephone Encounter (Signed)
Doxepin was discontinued and patient started on Trazodone , script sent out on Friday - August 4 th.

## 2022-07-24 NOTE — Telephone Encounter (Signed)
received fax that prior auth for the doxepine hcl tablets were not approved.  pt will need to have tried and failded 2 preferred medications. (perferred medication: flurazepam capsule '15mg'$  or '30mg'$ , temaqzepam capsule '15mg'$ , or '30mg'$ .  zolpidem tablet '5mg'$  '10mg'$ .)

## 2022-07-25 ENCOUNTER — Encounter: Payer: Self-pay | Admitting: Physician Assistant

## 2022-07-26 ENCOUNTER — Ambulatory Visit: Payer: Medicaid Other | Admitting: Physician Assistant

## 2022-07-26 NOTE — Progress Notes (Deleted)
     I,Sha'taria Nate Common,acting as a Education administrator for [employer], PA-C.,have documented all relevant documentation on the behalf of [name], PA-C,as directed by  [name], PA-C while in the presence of [name], PA-C.   Established patient visit   Patient: Amanda [name]   DOB: 1996-06-19   26 y.o. Female  MRN: [id number] Visit Date: 07/26/2022  Today's healthcare provider: [name], PA-C   No chief complaint on file.  Subjective    HPI  ***  Medications: Outpatient Medications Prior to Visit  Medication Sig   Adapalene (DIFFERIN) 0.3 % gel Apply to face qhs, wash off qam   Clindamycin-Benzoyl Per, Refr, (DUAC) gel Apply to face qam, wash off qhs   escitalopram (LEXAPRO) 10 MG tablet Take 1.5 tablets (15 mg total) by mouth daily. Dose change   gabapentin (NEURONTIN) 300 MG capsule Take 1 capsule (300 mg total) by mouth at bedtime.   hydrOXYzine (VISTARIL) 25 MG capsule TAKE 1-2 CAPSULES (25-50 MG TOTAL) BY MOUTH AT BEDTIME AS NEEDED. FOR ANXIETY AND SLEEP   methylPREDNISolone (MEDROL DOSEPAK) 4 MG TBPK tablet Take 6 pills on day 1, 5 pills on day 2, 4 pills on day 3, 3 pills on day 4, 2 pills on day 5, 1 pill on day 6 (Patient not taking: Reported on 07/21/2022)   spironolactone (ALDACTONE) 50 MG tablet Take 1 tablet (50 mg total) by mouth daily.   traZODone (DESYREL) 50 MG tablet Take 0.5 tablets (25 mg total) by mouth at bedtime as needed for sleep.   Ubrogepant (UBRELVY) 100 MG TABS Take 1 tablet by mouth as needed (May repeat after 2 hours.  Maximum 2 tablets in 24 hours.).   valACYclovir (VALTREX) 1000 MG tablet Take 1 tablet (1,000 mg total) by mouth daily.   No facility-administered medications prior to visit.    Review of Systems  {Labs  Heme  Chem  Endocrine  Serology  Results Review (optional):23779}   Objective    LMP 06/14/2022  {Show previous vital signs (optional):23777}  Physical Exam  ***  No results found for any visits on  07/26/22.  Assessment & Plan     ***  No follow-ups on file.      {provider attestation***:1}   [name], PA-C  [facility] [phone] (phone) [phone] (fax)  [place]

## 2022-07-27 ENCOUNTER — Encounter: Payer: Self-pay | Admitting: Physician Assistant

## 2022-07-27 ENCOUNTER — Ambulatory Visit (INDEPENDENT_AMBULATORY_CARE_PROVIDER_SITE_OTHER): Payer: Medicaid Other | Admitting: Physician Assistant

## 2022-07-27 VITALS — BP 108/70 | HR 101 | Ht 64.0 in | Wt 164.3 lb

## 2022-07-27 DIAGNOSIS — G43711 Chronic migraine without aura, intractable, with status migrainosus: Secondary | ICD-10-CM | POA: Diagnosis not present

## 2022-07-27 MED ORDER — KETOROLAC TROMETHAMINE 30 MG/ML IJ SOLN: 30.0000 mg | Freq: Once | INTRAMUSCULAR | Status: DC

## 2022-07-27 MED ORDER — KETOROLAC TROMETHAMINE 60 MG/2ML IM SOLN
60.0000 mg | Freq: Once | INTRAMUSCULAR | Status: AC
  Administered 2022-07-27: 30 mg via INTRAMUSCULAR

## 2022-07-27 NOTE — Assessment & Plan Note (Addendum)
Admin toradol 30 mg injection today as this relieved her pain last time. Pt rested for 5 minutes and felt well enough to leave. Advised she f/u with neurology for more options on preventative medications. Sounds like she has tried/failed gabapentin, topamax, ajovy.  Allergy (lip swelling) to triptans and ubrelvy ineffective.

## 2022-07-27 NOTE — Patient Instructions (Signed)
[facility] [address], [place], [place] [address] [phone]

## 2022-07-27 NOTE — Progress Notes (Signed)
I,[name],acting as a Education administrator for [employer], PA-C.,have documented all relevant documentation on the behalf of [name], PA-C,as directed by  [name], PA-C while in the presence of [name], PA-C.   Established patient visit   Patient: Amanda [name]   DOB: May 09, 1996   26 y.o. Female  MRN: [id number] Visit Date: 07/27/2022  Today's healthcare provider: [name], PA-C   Cc. Migraine x 2 weeks  Subjective    HPI  Follow up for migraines  Describes her current migraines as at the front of her forehead, tension at her neck. Typically 1-2 days a week, but lasting longer. Never completely goes away, a throbbing pain. She has had pain for the last 2 weeks. Reports associated neck pain. Denies changes in vision, dizziness, weakness, numbness. Is taking Gabapentin daily, unsure if it is helping. Reports ubrelvy doesn't help and it makes her tired. She does follow with neurology. -----------------------------------------------------------------------------------------   Medications: Outpatient Medications Prior to Visit  Medication Sig   Adapalene (DIFFERIN) 0.3 % gel Apply to face qhs, wash off qam   Clindamycin-Benzoyl Per, Refr, (DUAC) gel Apply to face qam, wash off qhs   escitalopram (LEXAPRO) 10 MG tablet Take 1.5 tablets (15 mg total) by mouth daily. Dose change   gabapentin (NEURONTIN) 300 MG capsule Take 1 capsule (300 mg total) by mouth at bedtime.   hydrOXYzine (VISTARIL) 25 MG capsule TAKE 1-2 CAPSULES (25-50 MG TOTAL) BY MOUTH AT BEDTIME AS NEEDED. FOR ANXIETY AND SLEEP   methylPREDNISolone (MEDROL DOSEPAK) 4 MG TBPK tablet Take 6 pills on day 1, 5 pills on day 2, 4 pills on day 3, 3 pills on day 4, 2 pills on day 5, 1 pill on day 6   spironolactone (ALDACTONE) 50 MG tablet Take 1 tablet (50 mg total) by mouth daily.   traZODone (DESYREL) 50 MG tablet Take 0.5 tablets (25 mg total) by mouth at bedtime as needed for sleep.   Ubrogepant  (UBRELVY) 100 MG TABS Take 1 tablet by mouth as needed (May repeat after 2 hours.  Maximum 2 tablets in 24 hours.).   valACYclovir (VALTREX) 1000 MG tablet Take 1 tablet (1,000 mg total) by mouth daily.   No facility-administered medications prior to visit.    Review of Systems  Constitutional:  Negative for fatigue and fever.  Respiratory:  Negative for cough and shortness of breath.   Cardiovascular:  Negative for chest pain and leg swelling.  Gastrointestinal:  Negative for abdominal pain.  Neurological:  Positive for headaches. Negative for dizziness.       Objective    BP 108/70 (BP Location: Right Arm, Patient Position: Sitting, Cuff Size: Normal)   Pulse (!) 101   Ht '5\' 4"'$  (1.626 m)   Wt 164 lb 4.8 oz (74.5 kg)   LMP 06/14/2022   SpO2 100%   BMI 28.20 kg/m  Blood pressure 108/70, pulse (!) 101, height '5\' 4"'$  (1.626 m), weight 164 lb 4.8 oz (74.5 kg), last menstrual period 06/14/2022, SpO2 100 %.   Physical Exam Vitals reviewed.  Constitutional:      Appearance: She is not ill-appearing.  HENT:     Head: Normocephalic.  Eyes:     Conjunctiva/sclera: Conjunctivae normal.     Pupils: Pupils are equal, round, and reactive to light.  Cardiovascular:     Rate and Rhythm: Normal rate.  Pulmonary:     Effort: Pulmonary effort is normal. No respiratory distress.  Neurological:  General: No focal deficit present.     Mental Status: She is alert and oriented to person, place, and time.  Psychiatric:        Mood and Affect: Mood normal.        Behavior: Behavior normal.     No results found for any visits on 07/27/22.  Assessment & Plan     Problem List Items Addressed This Visit       Cardiovascular and Mediastinum   Intractable chronic migraine without aura with status migrainosus - Primary    Admin toradol 30 mg injection today as this relieved her pain last time. Pt rested for 5 minutes and felt well enough to leave. Advised she f/u with neurology for more  options on preventative medications. Sounds like she has tried/failed gabapentin, topamax, ajovy.  Allergy (lip swelling) to triptans and ubrelvy ineffective.       I, [name], PA-C have reviewed all documentation for this visit. The documentation on  07/27/2022 for the exam, diagnosis, procedures, and orders are all accurate and complete.  [name], PA-C [facility] [address] [place], [place], [address] Office: [phone] Fax: [place]

## 2022-08-09 NOTE — Progress Notes (Unsigned)
NEUROLOGY FOLLOW UP OFFICE NOTE  Amanda [name] [id number]  Assessment/Plan:   Migraine without aura, cervicogenic   1.Migraine prevention:  start gabapentin 322m at bedtime 2.Migraine rescue:  Amanda try samples of Ubrelvy 1076m3.Limit use of pain relievers to no more than 2 days out of week to prevent risk of rebound or medication-overuse headache. 4.Keep headache diary 5.Continue OMM 6.Follow up 4 months   Subjective:  Amanda [name] a [age]ear old right-handed female who follows up for migraines.   UPDATE: Started gabapentin in February. UbRoselyn [name]** Initially, she saw improvement in her migraines, ***.  She started having worsening migraines *** after initiating doxycycline for acne.  ***  Frequency of abortive medication: 1 in last 4 days Takes Tylenol after lunch once daily or every other day. Current NSAIDS/analgesics:  none Current triptans:  none Current ergotamine:  none Current anti-emetic:  none Current muscle relaxants:  none Current Antihypertensive medications:  none Current Antidepressant medications:  Lexapro 5g Current Anticonvulsant medications:  gabapentin 30055mt bedtime Current anti-CGRP:  Ubrelvy 100m67mrrent Vitamins/Herbal/Supplements:  none Current Antihistamines/Decongestants:  Zyrtec Other therapy:  OMM for neck pain Hormone/birth control:  none   Caffeine:  No coffee.  Sometimes a Red Bull Diet:  Hydrates with water.  Tries not to skip meals.  Occasional soda Exercise:  Walks routine Depression:  improved; Anxiety:  no Other pain:  no Sleep hygiene:  At least 6-7 hours   HISTORY:  Migraines since teenager.  They are bifrontal/facial pressure with photophobia, phonophobia, dizziness and sometimes nausea.  In February 2018, she had a migraine where she lost vision in the left eye.  MRI of brain with and without contrast on 01/29/2017 personally reviewed was normal.  They would last 4-5 hours and occur every 2 to 3 days.. Every 2 to 3 days,  not as severe, 4 to 5 hours.  Improved during pregnancy in 2020 but became worse following birth of her twins.  Since late 2020, they are daily, lasting 4-5 hours (but within 30 minutes with Nurtec).  They are moderate in the morning and gradually become more severe later in the day with worsening dizziness.  She takes Tylenol once a day to every other day.       Past NSAIDS/analgesics:  Ibuprofen (contraindicated - stomach), Excedrin, acetaminophen Past abortive triptans:  Sumatriptan (lip and tongue swelling), rizatriptan (drowsiness) Past abortive ergotamine:  none Past muscle relaxants:  none Past anti-emetic:  Zofran 4mg 51mt antihypertensive medications:  none Past antidepressant medications:  Amitriptyline (drowsiness), sertraline Past anticonvulsant medications:  none Past anti-CGRP:  Emgality (painful), Nurtec (ineffective) Past vitamins/Herbal/Supplements:  none Past antihistamines/decongestants:  none     Family history of headache:  Mom (chronic migraines)  PAST MEDICAL HISTORY: Past Medical History:  Diagnosis Date   BRCA negative 09/2020   MyRisk neg except AXIN2 VUS   Family history of adverse reaction to anesthesia    heart condition   Family history of breast cancer 09/2020   IBIS=15.7%/riskscore=14.7%   Family history of pancreatic cancer    GERD (gastroesophageal reflux disease)    Migraine     MEDICATIONS: Current Outpatient Medications on File Prior to Visit  Medication Sig Dispense Refill   Adapalene (DIFFERIN) 0.3 % gel Apply to face qhs, wash off qam 45 g 2   Clindamycin-Benzoyl Per, Refr, (DUAC) gel Apply to face qam, wash off qhs 45 g 2   escitalopram (LEXAPRO) 10 MG tablet Take 1.5 tablets (15 mg total) by mouth  daily. Dose change 135 tablet 0   gabapentin (NEURONTIN) 300 MG capsule Take 1 capsule (300 mg total) by mouth at bedtime. 30 capsule 5   hydrOXYzine (VISTARIL) 25 MG capsule TAKE 1-2 CAPSULES (25-50 MG TOTAL) BY MOUTH AT BEDTIME AS NEEDED.  FOR ANXIETY AND SLEEP 180 capsule 0   methylPREDNISolone (MEDROL DOSEPAK) 4 MG TBPK tablet Take 6 pills on day 1, 5 pills on day 2, 4 pills on day 3, 3 pills on day 4, 2 pills on day 5, 1 pill on day 6 1 each 0   spironolactone (ALDACTONE) 50 MG tablet Take 1 tablet (50 mg total) by mouth daily. 30 tablet 3   traZODone (DESYREL) 50 MG tablet Take 0.5 tablets (25 mg total) by mouth at bedtime as needed for sleep. 15 tablet 1   Ubrogepant (UBRELVY) 100 MG TABS Take 1 tablet by mouth as needed (May repeat after 2 hours.  Maximum 2 tablets in 24 hours.). 10 tablet 5   valACYclovir (VALTREX) 1000 MG tablet Take 1 tablet (1,000 mg total) by mouth daily. 90 tablet 2   [DISCONTINUED] dicyclomine (BENTYL) 20 MG tablet Take 1 tablet (20 mg total) by mouth 2 (two) times daily. (Patient not taking: No sig reported) 20 tablet 0   [DISCONTINUED] norethindrone (MICRONOR) 0.35 MG tablet Take 1 tablet (0.35 mg total) by mouth daily. (Patient not taking: No sig reported) 30 tablet 6   [DISCONTINUED] omeprazole (PRILOSEC) 40 MG capsule Take 1 capsule (40 mg total) by mouth in the morning and at bedtime. (Patient not taking: Reported on 01/04/2021) 60 capsule 2   No current facility-administered medications on file prior to visit.    ALLERGIES: Allergies  Allergen Reactions   Imitrex [Sumatriptan] Swelling    Facial swelling and burning lips   Penicillins Hives    Did it involve swelling of the face/tongue/throat, SOB, or low BP? No Did it involve sudden or severe rash/hives, skin peeling, or any reaction on the inside of your mouth or nose? Yes Did you need to seek medical attention at a hospital or doctor's office? Yes When did it last happen?      childhood allergy If all above answers are "NO", may proceed with cephalosporin use.    FAMILY HISTORY: Family History  Problem Relation Age of Onset   Migraines Mother    Skin cancer Father    Asthma Maternal Grandmother    Hypertension Maternal Grandmother     Thyroid disease Maternal Grandmother    Hypertension Maternal Grandfather    Pancreatic cancer Maternal Grandfather 3   Colon cancer Maternal Grandfather 60   Hypertension Paternal Grandmother    Diabetes Paternal Grandmother    Hypertension Paternal Grandfather    Diabetes Paternal Grandfather    Colon cancer Paternal Grandfather 30   Thyroid disease Maternal Aunt    Breast cancer Maternal Aunt 45   Allergies Brother       Objective:  *** General: No acute distress.  Patient appears ***-groomed.   Head:  Normocephalic/atraumatic Eyes:  Fundi examined but not visualized Neck: supple, no paraspinal tenderness, full range of motion Heart:  Regular rate and rhythm Lungs:  Clear to auscultation bilaterally Back: No paraspinal tenderness Neurological Exam: alert and oriented to person, place, and time.  Speech fluent and not dysarthric, language intact.  CN II-XII intact. Bulk and tone normal, muscle strength 5/5 throughout.  Sensation to light touch intact.  Deep tendon reflexes 2+ throughout, toes downgoing.  Finger to nose testing intact.  Gait normal, Romberg negative.   Metta Clines, DO  CC: ***

## 2022-08-10 ENCOUNTER — Encounter: Payer: Self-pay | Admitting: Neurology

## 2022-08-10 ENCOUNTER — Ambulatory Visit: Payer: Medicaid Other | Admitting: Neurology

## 2022-08-10 VITALS — BP 107/71 | HR 81 | Ht 64.0 in | Wt 166.0 lb

## 2022-08-10 DIAGNOSIS — G43719 Chronic migraine without aura, intractable, without status migrainosus: Secondary | ICD-10-CM | POA: Diagnosis not present

## 2022-08-10 MED ORDER — TOPIRAMATE 25 MG PO TABS: 25.0000 mg | ORAL_TABLET | Freq: Every day | ORAL | 3 refills | Status: DC

## 2022-08-10 NOTE — Patient Instructions (Addendum)
  Start topiramate '25mg'$  at bedtime.  Contact us in 4 weeks with update and we can increase dose if needed. Take Zavzpret - in spray in one nostril (maximum 1 in 24 hours).  Let me know if effective.  Limit use of pain relievers to no more than 2 days out of the week.  These medications include acetaminophen, NSAIDs (ibuprofen/Advil/Motrin, naproxen/Aleve, triptans (Imitrex/sumatriptan), Excedrin, and narcotics.  This will help reduce risk of rebound headaches. Be aware of common food triggers:  - Caffeine:  coffee, black tea, cola, Mt. Dew  - Chocolate  - Dairy:  aged cheeses (brie, blue, cheddar, gouda, Emma, provolone, [place], Swiss, etc), chocolate milk, buttermilk, sour cream, limit eggs and yogurt  - Nuts, peanut butter  - Alcohol  - Cereals/grains:  FRESH breads (fresh bagels, sourdough, doughnuts), yeast productions  - Processed/canned/aged/cured meats (pre-packaged deli meats, hotdogs)  - MSG/glutamate:  soy sauce, flavor enhancer, pickled/preserved/marinated foods  - Sweeteners:  aspartame (Equal, Nutrasweet).  Sugar and Splenda are okay  - Vegetables:  legumes (lima beans, lentils, snow peas, fava beans, pinto peans, peas, garbanzo beans), sauerkraut, onions, olives, pickles  - Fruit:  avocados, bananas, citrus fruit (orange, lemon, grapefruit), mango  - Other:  Frozen meals, macaroni and cheese Routine exercise Stay adequately hydrated (aim for 64 oz water daily) Keep headache diary Maintain proper stress management Maintain proper sleep hygiene Do not skip meals Consider supplements:  magnesium citrate '400mg'$  daily, riboflavin '400mg'$  daily, coenzyme Q10 '100mg'$  three times daily. Follow up 3 months.

## 2022-08-10 NOTE — Progress Notes (Signed)
Medication Samples have been provided to the patient.  Drug name: Zavzpret       Strength: 10 mg        Qty: 1  LOT: [id number]  Exp.Date: 01/2024  Dosing instructions: as needed  The patient has been instructed regarding the correct time, dose, and frequency of taking this medication, including desired effects and most common side effects.   Venetia Night 10:17 AM 08/10/2022

## 2022-08-13 ENCOUNTER — Other Ambulatory Visit: Payer: Self-pay | Admitting: Psychiatry

## 2022-08-13 DIAGNOSIS — G4700 Insomnia, unspecified: Secondary | ICD-10-CM

## 2022-08-14 NOTE — Progress Notes (Deleted)
  [facility] [place] Phone: [phone] Subjective:    I'm seeing this patient by the request  of:  [name], PA-C  CC:   [id number]  Amanda [name] is a 26 y.o. female coming in with complaint of back and neck pain. OMT 07/04/2022. Patient states   Medications patient has been prescribed: None  Taking:         Reviewed prior external information including notes and imaging from previsou exam, outside providers and external EMR if available.   As well as notes that were available from care everywhere and other healthcare systems.  Past medical history, social, surgical and family history all reviewed in electronic medical record.  No pertanent information unless stated regarding to the chief complaint.   Past Medical History:  Diagnosis Date   BRCA negative 09/2020   MyRisk neg except AXIN2 VUS   Family history of adverse reaction to anesthesia    heart condition   Family history of breast cancer 09/2020   IBIS=15.7%/riskscore=14.7%   Family history of pancreatic cancer    GERD (gastroesophageal reflux disease)    Migraine     Allergies  Allergen Reactions   Imitrex [Sumatriptan] Swelling    Facial swelling and burning lips   Penicillins Hives    Did it involve swelling of the face/tongue/throat, SOB, or low BP? No Did it involve sudden or severe rash/hives, skin peeling, or any reaction on the inside of your mouth or nose? Yes Did you need to seek medical attention at a hospital or doctor's office? Yes When did it last happen?      childhood allergy If all above answers are "NO", may proceed with cephalosporin use.     Review of Systems:  No headache, visual changes, nausea, vomiting, diarrhea, constipation, dizziness, abdominal pain, skin rash, fevers, chills, night sweats, weight loss, swollen lymph nodes, body aches, joint swelling, chest pain, shortness of breath, mood changes. POSITIVE  muscle aches  Objective  There were no vitals taken for this visit.   General: No apparent distress alert and oriented x3 mood and affect normal, dressed appropriately.  HEENT: Pupils equal, extraocular movements intact  Respiratory: Patient's speak in full sentences and does not appear short of breath  Cardiovascular: No lower extremity edema, non tender, no erythema  Gait MSK:  Back   Osteopathic findings  C2 flexed rotated and side bent right C6 flexed rotated and side bent left T3 extended rotated and side bent right inhaled rib T9 extended rotated and side bent left L2 flexed rotated and side bent right Sacrum right on right       Assessment and Plan:  No problem-specific Assessment & Plan notes found for this encounter.    Nonallopathic problems  Decision today to treat with OMT was based on Physical Exam  After verbal consent patient was treated with HVLA, ME, FPR techniques in cervical, rib, thoracic, lumbar, and sacral  areas  Patient tolerated the procedure well with improvement in symptoms  Patient given exercises, stretches and lifestyle modifications  See medications in patient instructions if given  Patient will follow up in 4-8 weeks             Note: This dictation was prepared with Dragon dictation along with smaller phrase technology. Any transcriptional errors that result from this process are unintentional.

## 2022-08-15 ENCOUNTER — Ambulatory Visit: Payer: Medicaid Other | Admitting: Family Medicine

## 2022-08-18 ENCOUNTER — Other Ambulatory Visit: Payer: Self-pay | Admitting: Neurology

## 2022-08-18 ENCOUNTER — Encounter: Payer: Self-pay | Admitting: Neurology

## 2022-08-18 MED ORDER — ZAVZPRET 10 MG/ACT NA SOLN: 1.0000 | Freq: Every day | NASAL | 5 refills | Status: DC | PRN

## 2022-08-22 ENCOUNTER — Ambulatory Visit (HOSPITAL_COMMUNITY): Payer: Self-pay | Admitting: Licensed Clinical Social Worker

## 2022-08-22 NOTE — Progress Notes (Signed)
Amanda [name] [address] [place] Phone: [phone] Subjective:   IVilma [name], am serving as a scribe for Dr. Hulan Saas.  I'm seeing this patient by the request  of:  Amanda Kirschner, PA-C  CC: Neck and back pain follow-up  [name]  CONLEY PAWLING is a 26 y.o. female coming in with complaint of back and neck pain. OMT 07/04/2022. Patient states doing well. Has had some more intense lower back pain recently. No new issues.  Medications patient has been prescribed: None  Taking:  Reviewing patient's chart patient scheduled for tonsillectomy next week.       Reviewed prior external information including notes and imaging from previsou exam, outside providers and external EMR if available.   As well as notes that were available from care everywhere and other healthcare systems.  Past medical history, social, surgical and family history all reviewed in electronic medical record.  No pertanent information unless stated regarding to the chief complaint.   Past Medical History:  Diagnosis Date   BRCA negative 09/2020   MyRisk neg except AXIN2 VUS   Family history of adverse reaction to anesthesia    heart condition   Family history of breast cancer 09/2020   IBIS=15.7%/riskscore=14.7%   Family history of pancreatic cancer    GERD (gastroesophageal reflux disease)    Migraine     Allergies  Allergen Reactions   Imitrex [Sumatriptan] Swelling    Facial swelling and burning lips   Penicillins Hives    Did it involve swelling of the face/tongue/throat, SOB, or low BP? No Did it involve sudden or severe rash/hives, skin peeling, or any reaction on the inside of your mouth or nose? Yes Did you need to seek medical attention at a hospital or doctor's office? Yes When did it last happen?      childhood allergy If all above answers are "NO", may proceed with cephalosporin use.     Review of Systems:  No headache,  visual changes, nausea, vomiting, diarrhea, constipation, dizziness, abdominal pain, skin rash, fevers, chills, night sweats, weight loss, swollen lymph nodes, body aches, joint swelling, chest pain, shortness of breath, mood changes. POSITIVE muscle aches  Objective  Blood pressure 114/68, pulse (!) 102, height 5' 4"  (1.626 m), weight 167 lb (75.8 kg), SpO2 99 %.   General: No apparent distress alert and oriented x3 mood and affect normal, dressed appropriately.  HEENT: Pupils equal, extraocular movements intact  Respiratory: Patient's speak in full sentences and does not appear short of breath  Cardiovascular: No lower extremity edema, non tender, no erythema  Gait normal  MSK:  Back does have significant tightness of the hip flexor.  Tight hip flexor also noted.  Patient did have some mild tightness with FABER test.  More than usual.  Seems to be right greater than left  Osteopathic findings  C2 flexed rotated and side bent right C7 flexed rotated and side bent left T3 extended rotated and side bent right inhaled rib T8 extended rotated and side bent left L2 flexed rotated and side bent right L3 flexed rotated Sacrum right on right       Assessment and Plan:  Thyromegaly Awaiting her rheumatology for February.  Labs were unremarkable other than autoimmune   Cervicogenic headache Chronic problem with some worsening symptoms at this time.  Discussed posture and ergonomics.  Patient did have worsening pain and we have seen her in the lower back and given Toradol and  Depo-Medrol injection today.  Discussed icing regimen and home exercises.  Increase activity slowly.  Follow-up with me again in 6 to 8 weeks.    Nonallopathic problems  Decision today to treat with OMT was based on Physical Exam  After verbal consent patient was treated with HVLA, ME, FPR techniques in cervical, rib, thoracic, lumbar, and sacral  areas  Patient tolerated the procedure well with improvement in  symptoms  Patient given exercises, stretches and lifestyle modifications  See medications in patient instructions if given  Patient will follow up in 4-8 weeks     The above documentation has been reviewed and is accurate and complete [name], DO         Note: This dictation was prepared with Dragon dictation along with smaller phrase technology. Any transcriptional errors that result from this process are unintentional.

## 2022-08-24 ENCOUNTER — Encounter: Payer: Self-pay | Admitting: Physician Assistant

## 2022-08-29 ENCOUNTER — Ambulatory Visit (INDEPENDENT_AMBULATORY_CARE_PROVIDER_SITE_OTHER): Payer: Medicaid Other | Admitting: Family Medicine

## 2022-08-29 ENCOUNTER — Telehealth (INDEPENDENT_AMBULATORY_CARE_PROVIDER_SITE_OTHER): Payer: Medicaid Other | Admitting: Psychiatry

## 2022-08-29 ENCOUNTER — Encounter: Payer: Self-pay | Admitting: Otolaryngology

## 2022-08-29 ENCOUNTER — Other Ambulatory Visit: Payer: Self-pay

## 2022-08-29 ENCOUNTER — Encounter: Payer: Self-pay | Admitting: Psychiatry

## 2022-08-29 VITALS — BP 114/68 | HR 102 | Ht 64.0 in | Wt 167.0 lb

## 2022-08-29 DIAGNOSIS — F411 Generalized anxiety disorder: Secondary | ICD-10-CM | POA: Diagnosis not present

## 2022-08-29 DIAGNOSIS — F3342 Major depressive disorder, recurrent, in full remission: Secondary | ICD-10-CM | POA: Diagnosis not present

## 2022-08-29 DIAGNOSIS — M9904 Segmental and somatic dysfunction of sacral region: Secondary | ICD-10-CM

## 2022-08-29 DIAGNOSIS — M9902 Segmental and somatic dysfunction of thoracic region: Secondary | ICD-10-CM | POA: Diagnosis not present

## 2022-08-29 DIAGNOSIS — M9908 Segmental and somatic dysfunction of rib cage: Secondary | ICD-10-CM | POA: Diagnosis not present

## 2022-08-29 DIAGNOSIS — E010 Iodine-deficiency related diffuse (endemic) goiter: Secondary | ICD-10-CM | POA: Diagnosis not present

## 2022-08-29 DIAGNOSIS — M9903 Segmental and somatic dysfunction of lumbar region: Secondary | ICD-10-CM

## 2022-08-29 DIAGNOSIS — G4701 Insomnia due to medical condition: Secondary | ICD-10-CM | POA: Diagnosis not present

## 2022-08-29 DIAGNOSIS — M9901 Segmental and somatic dysfunction of cervical region: Secondary | ICD-10-CM

## 2022-08-29 DIAGNOSIS — G4486 Cervicogenic headache: Secondary | ICD-10-CM | POA: Diagnosis not present

## 2022-08-29 MED ORDER — KETOROLAC TROMETHAMINE 30 MG/ML IJ SOLN
30.0000 mg | Freq: Once | INTRAMUSCULAR | Status: AC
  Administered 2022-08-29: 30 mg via INTRAMUSCULAR

## 2022-08-29 MED ORDER — METHYLPREDNISOLONE ACETATE 40 MG/ML IJ SUSP
40.0000 mg | Freq: Once | INTRAMUSCULAR | Status: AC
  Administered 2022-08-29: 40 mg via INTRAMUSCULAR

## 2022-08-29 MED ORDER — ESCITALOPRAM OXALATE 5 MG PO TABS: 5.0000 mg | ORAL_TABLET | Freq: Every day | ORAL | 0 refills | Status: DC

## 2022-08-29 MED ORDER — ESCITALOPRAM OXALATE 10 MG PO TABS: 10.0000 mg | ORAL_TABLET | Freq: Every day | ORAL | 0 refills | Status: DC

## 2022-08-29 NOTE — Patient Instructions (Addendum)
Good to see you! Stay active Eat a lot of ice cream next week

## 2022-08-29 NOTE — Assessment & Plan Note (Signed)
Chronic problem with some worsening symptoms at this time.  Discussed posture and ergonomics.  Patient did have worsening pain and we have seen her in the lower back and given Toradol and Depo-Medrol injection today.  Discussed icing regimen and home exercises.  Increase activity slowly.  Follow-up with me again in 6 to 8 weeks.

## 2022-08-29 NOTE — Addendum Note (Signed)
Addended by: Judy Pimple R on: 08/29/2022 10:28 AM   Modules accepted: Orders

## 2022-08-29 NOTE — Progress Notes (Signed)
Virtual Visit via Video Note  I connected with Amanda [name] on 08/29/22 at 11:40 AM EDT by a video enabled telemedicine application and verified that I am speaking with the correct person using two identifiers.  Location Provider Location : [facility] Patient Location : Home  Participants: Patient , Provider   I discussed the limitations of evaluation and management by telemedicine and the availability of in person appointments. The patient expressed understanding and agreed to proceed.   I discussed the assessment and treatment plan with the patient. The patient was provided an opportunity to ask questions and all were answered. The patient agreed with the plan and demonstrated an understanding of the instructions.   The patient was advised to call back or seek an in-person evaluation if the symptoms worsen or if the condition fails to improve as anticipated.   [facility] MD OP Progress Note  08/29/2022 5:19 PM Amanda [name]  MRN:  [id number]  Chief Complaint:  Chief Complaint  Patient presents with   Follow-up: 26 year old female with history of anxiety, depression, presented for medication management.   HPI: Amanda [name] is a 26 year old female, engaged, self-employed, lives in [place], has a history of GAD, MDD was evaluated by telemedicine today.  Patient today reports overall she has been making progress on the current medication regimen.  She however reports she does have back pain which did affect her mood as well as sleep however she was able to get injections to her back this morning.  That has helped and she is hopeful it will continue to help her.  Reports sleep is affected by her pain.  She does have trazodone available which has helped.  She would like to stay on the trazodone, current dose.  Not interested in dosage increase at this time.  Currently compliant on the Lexapro 15 mg, denies side effects.  Patient continues to be motivated to stay in therapy.  Denies any  suicidality, homicidality or perceptual disturbances.  Patient denies any other concerns today.  Visit Diagnosis:    ICD-10-CM   1. GAD (generalized anxiety disorder)  F41.1 escitalopram (LEXAPRO) 5 MG tablet    escitalopram (LEXAPRO) 10 MG tablet    2. MDD (major depressive disorder), recurrent, in full remission ([place])  F33.42 escitalopram (LEXAPRO) 5 MG tablet    escitalopram (LEXAPRO) 10 MG tablet    3. Insomnia due to medical condition  G47.01    mood , sleep      Past Psychiatric History: Reviewed past psychiatric history from progress note on 02/02/2022.  Past trials of Zoloft-did not work, Effexor-made her suicidal.  Past Medical History:  Past Medical History:  Diagnosis Date   BRCA negative 09/2020   MyRisk neg except AXIN2 VUS   Family history of adverse reaction to anesthesia    heart condition   Family history of breast cancer 09/2020   IBIS=15.7%/riskscore=14.7%   Family history of pancreatic cancer    GERD (gastroesophageal reflux disease)    Migraine     Past Surgical History:  Procedure Laterality Date   NO PAST SURGERIES     WISDOM TOOTH EXTRACTION      Family Psychiatric History: Reviewed  family psychiatric history from progress note on 02/02/2022.  Family History:  Family History  Problem Relation Age of Onset   Migraines Mother    Skin cancer Father    Asthma Maternal Grandmother    Hypertension Maternal Grandmother    Thyroid disease Maternal Grandmother    Hypertension Maternal Grandfather  Pancreatic cancer Maternal Grandfather 59   Colon cancer Maternal Grandfather 60   Hypertension Paternal Grandmother    Diabetes Paternal Grandmother    Hypertension Paternal Grandfather    Diabetes Paternal Grandfather    Colon cancer Paternal Grandfather 47   Thyroid disease Maternal Aunt    Breast cancer Maternal Aunt 62   Allergies Brother     Social History: Reviewed social history from progress note on 02/02/2022. Social History    Socioeconomic History   Marital status: Single    Spouse name: [name]   Number of children: 2   Years of education: Not on file   Highest education level: Associate degree: occupational, Hotel manager, or vocational program  Occupational History   Occupation: Massage Therapy  Tobacco Use   Smoking status: Never   Smokeless tobacco: Never  Vaping Use   Vaping Use: Never used  Substance and Sexual Activity   Alcohol use: Never   Drug use: No   Sexual activity: Yes    Partners: Male    Birth control/protection: None  Other Topics Concern   Not on file  Social History Narrative   ** Merged History Encounter **       One story home Right-handed Caffeine: occasional coffee   Social Determinants of Health   Financial Resource Strain: Not on file  Food Insecurity: Not on file  Transportation Needs: Not on file  Physical Activity: Not on file  Stress: Stress Concern Present (12/01/2021)   [employer] [place] of Stress : Rather much  Social Connections: Not on file    Allergies:  Allergies  Allergen Reactions   Imitrex [Sumatriptan] Swelling    Facial swelling and burning lips   Penicillins Hives    Did it involve swelling of the face/tongue/throat, SOB, or low BP? No Did it involve sudden or severe rash/hives, skin peeling, or any reaction on the inside of your mouth or nose? Yes Did you need to seek medical attention at a hospital or doctor's office? Yes When did it last happen?      childhood allergy If all above answers are "NO", may proceed with cephalosporin use.    Metabolic Disorder Labs: Lab Results  Component Value Date   HGBA1C 5.3 01/07/2015   No results found for: "PROLACTIN" Lab Results  Component Value Date   CHOL 135 06/28/2020   TRIG 54 06/28/2020   HDL 54 06/28/2020   LDLCALC 69 06/28/2020   Lab Results  Component Value Date   TSH 1.78 07/04/2022   TSH 1.600  03/08/2022    Therapeutic Level Labs: No results found for: "LITHIUM" No results found for: "VALPROATE" No results found for: "CBMZ"  Current Medications: Current Outpatient Medications  Medication Sig Dispense Refill   Adapalene (DIFFERIN) 0.3 % gel Apply to face qhs, wash off qam 45 g 2   Clindamycin-Benzoyl Per, Refr, (DUAC) gel Apply to face qam, wash off qhs 45 g 2   escitalopram (LEXAPRO) 10 MG tablet Take 1 tablet (10 mg total) by mouth daily. Take along with 5 mg daily 90 tablet 0   escitalopram (LEXAPRO) 5 MG tablet Take 1 tablet (5 mg total) by mouth daily. Take along with 10 mg daily 90 tablet 0   hydrOXYzine (VISTARIL) 25 MG capsule TAKE 1-2 CAPSULES (25-50 MG TOTAL) BY MOUTH AT BEDTIME AS NEEDED. FOR ANXIETY AND SLEEP 180 capsule 0   spironolactone (ALDACTONE) 50 MG tablet Take 1 tablet (50 mg  total) by mouth daily. 30 tablet 3   topiramate (TOPAMAX) 25 MG tablet Take 1 tablet (25 mg total) by mouth at bedtime. 30 tablet 3   traZODone (DESYREL) 50 MG tablet TAKE 0.5 TABLETS BY MOUTH AT BEDTIME AS NEEDED FOR SLEEP. 45 tablet 0   valACYclovir (VALTREX) 1000 MG tablet Take 1 tablet (1,000 mg total) by mouth daily. 90 tablet 2   Zavegepant HCl (ZAVZPRET) 10 MG/ACT SOLN Place 1 spray into the nose daily as needed. Maximum 1 spray in 24 hours. 8 each 5   No current facility-administered medications for this visit.     Musculoskeletal: Strength & Muscle Tone:  UTA Gait & Station:  Seated Patient leans: N/A  Psychiatric Specialty Exam: Review of Systems  Musculoskeletal:  Positive for back pain (improving).  Psychiatric/Behavioral:  Positive for sleep disturbance (improving).   All other systems reviewed and are negative.   Last menstrual period 08/16/2022.There is no height or weight on file to calculate BMI.  General Appearance: Casual  Eye Contact:  Fair  Speech:  Clear and Coherent  Volume:  Normal  Mood:  Euthymic  Affect:  Congruent  Thought Process:  Goal  Directed and Descriptions of Associations: Intact  Orientation:  Full (Time, Place, and Person)  Thought Content: Logical   Suicidal Thoughts:  No  Homicidal Thoughts:  No  Memory:  Immediate;   Fair Recent;   Fair Remote;   Fair  Judgement:  Fair  Insight:  Fair  Psychomotor Activity:  Normal  Concentration:  Concentration: Fair and Attention Span: Fair  Recall:  [employer] of Knowledge: Fair  Language: Fair  Akathisia:  No  Handed:  Right  AIMS (if indicated): not done  Assets:  Communication Skills Desire for Improvement Housing Social Support Transportation  ADL's:  Intact  Cognition: WNL  Sleep:   improving   Screenings: AIMS    Flowsheet Row Video Visit from 07/21/2022 in [facility] Total Score 0      GAD-7    Flowsheet Row Video Visit from 08/29/2022 in [facility] Video Visit from 07/21/2022 in [facility] Video Visit from 06/06/2022 in [facility] Office Visit from 06/01/2022 in [facility] Video Visit from 03/06/2022 in [facility]  Total GAD-7 Score 5 8 11 10 10       PHQ2-9    Flowsheet Row Video Visit from 08/29/2022 in [facility] Video Visit from 07/21/2022 in [facility] Video Visit from 06/06/2022 in [facility] Office Visit from 06/01/2022 in [facility] Video Visit from 03/06/2022 in [facility]  PHQ-2 Total Score 0 1 0 2 0  PHQ-9 Total Score -- -- -- 11 2      Flowsheet Row Video Visit from 08/29/2022 in [facility] Video Visit from 07/21/2022 in [facility] Counselor from 07/11/2022 in [facility] No Risk No Risk No Risk        Assessment and Plan: FLORIA BRANDAU is a 26 year old female, engaged, lives in [place], has a history of GAD, MDD was evaluated by telemedicine today.  Patient is currently improving, will benefit from the following plan.  Plan  GAD-improving Lexapro 15 mg p.o. daily Continue CBT with Ms. [name] Hussami Hydroxyzine 25-50 mg at bedtime as needed  MDD in remission Lexapro 15 mg p.o. daily  Insomnia-improving Patient will  need sufficient pain management. Trazodone 25 mg at bedtime as needed for sleep.  Follow-up in clinic in 2 to 3 months or sooner if needed.  Consent: Patient/Guardian gives verbal consent for treatment and assignment of benefits for services provided during this visit. Patient/Guardian expressed understanding and agreed to proceed.   This note was generated in part or whole with voice recognition software. Voice recognition is usually quite accurate but there are transcription errors that can and very often do occur. I apologize for any typographical errors that were not detected and corrected.      Ursula Alert, MD 08/31/2022, 8:23 AM

## 2022-08-29 NOTE — Assessment & Plan Note (Signed)
Awaiting her rheumatology for February.  Labs were unremarkable other than autoimmune

## 2022-08-30 ENCOUNTER — Ambulatory Visit (HOSPITAL_COMMUNITY): Payer: Medicaid Other | Admitting: Licensed Clinical Social Worker

## 2022-08-31 ENCOUNTER — Ambulatory Visit (HOSPITAL_COMMUNITY): Payer: Self-pay | Admitting: Licensed Clinical Social Worker

## 2022-09-01 NOTE — Discharge Instructions (Signed)
T & A INSTRUCTION SHEET - [facility]  P. SCOTT BENNETT, MD  INFORMATION SHEET FOR A TONSILLECTOMY AND ADENDOIDECTOMY  About Your Tonsils and Adenoids The tonsils and adenoids are normal body tissues that are part of our immune system. They normally help to protect us against diseases that may enter our mouth and nose. However, sometimes the tonsils and/or adenoids become too large and obstruct our breathing, especially at night.  If either of these things happen it helps to remove the tonsils and adenoids in order to become healthier. The operation to remove the tonsils and adenoids is called a tonsillectomy and adenoidectomy.  The Location of Your Tonsils and Adenoids The tonsils are located in the back of the throat on both side and sit in a cradle of muscles. The adenoids are located in the roof of the mouth, behind the nose, and closely associated with the opening of the Eustachian tube to the ear.  Surgery on Tonsils and Adenoids A tonsillectomy and adenoidectomy is a short operation which takes about thirty minutes. This includes being put to sleep and being awakened. Tonsillectomies and adenoidectomies are performed at [facility] and may require observation period in the recovery room prior to going home. Children are required to remain in the recovery area for 45 minutes after surgery.  Following the Operation for a Tonsillectomy A cautery machine is used to control bleeding.  Bleeding from a tonsillectomy and adenoidectomy is minimal and postoperatively the risk of bleeding is approximately four percent, although this rarely life threatening.  After your tonsillectomy and adenoidectomy post-op care at home: 1. Our patients are able to go home the same day. You may be given prescriptions for pain medications and antibiotics, if indicated. 2. It is extremely important to remember that fluid intake is of utmost importance after a  tonsillectomy. The amount that you drink must be maintained in the postoperative period. A good indication of whether a child is getting enough fluid is whether his/her urine output is constant.  As long as children are urinating or wetting their diaper every 6 - 8 hours this is usually enough fluid intake.   3. Although rare, this is a risk of some bleeding in the first ten days after surgery. This usually occurs between day five and nine postoperatively. This risk of bleeding is approximately four percent.  If you or your child should have any bleeding you should remain calm and notify our office or go directly to the Emergency Room at [facility] where they will contact us. Our doctors are available seven days a week for notification. We recommend sitting up quietly in a chair, place an ice pack on the front of the neck and spitting out the blood gently until we are able to contact you. Adults should gargle gently with ice water and this may help stop the bleeding. If the bleeding does not stop after a short time, i.e. 10 to 15 minutes, or seems to be increasing again, please contact us or go to the hospital.   4. It is common for the pain to be worse at 5 - 7 days postoperatively. This occurs because the "scab" is peeling off and the mucous membrane (skin of the throat) is growing back where the tonsils were.   5. It is common for a low-grade fever, less than 102, during the first week after a tonsillectomy and adenoidectomy. It is usually due to not drinking enough   liquids, and we suggest your use liquid Tylenol (acetaminophen) or the pain medicine with Tylenol (acetaminophen) prescribed in order to keep your temperature below 102. Please follow the directions on the back of the bottle. 6. Do not take aspirin or any products that contain aspirin such as Bufferin, Anacin, Ecotrin, aspirin gum, Goodies, BC headache powders, etc., after a T&A because it can promote bleeding.  DO NOT TAKE  MOTRIN OR IBUPROFEN. Please check with our office before administering any other medication that may been prescribed by other doctors during the two-week post-operative period. 7. If you happen to look in the mirror or into your child's mouth you will see white/gray patches on the back of the throat.  This is what a scab looks like in the mouth and is normal after having a tonsillectomy and adenoidectomy. It will disappear once the tonsil area heals completely. However, it may cause a noticeable odor, and this too will disappear with time.     8. You or your child may experience ear pain after having a tonsillectomy and adenoidectomy. This is called referred pain and comes from the throat, but it is felt in the ears. Ear pain is quite common and expected. It will usually go away after ten days. There is usually nothing wrong with the ears, and it is primarily due to the healing area stimulating the nerve to the ear that runs along the side of the throat. Use either the prescribed pain medicine or Tylenol (acetaminophen) as needed.  9. The throat tissues after a tonsillectomy are obviously sensitive. Smoking around children who have had a tonsillectomy significantly increases the risk of bleeding.  DO NOT SMOKE! What to Expect Each Day  First Day at Home 1. Patients will be discharged home the same day.  2. Drink at least four glasses of liquid a day. Clear, cool liquids are recommended. Fruit juices containing citric acid are not recommended because they tend to cause pain. Carbonated beverages are allowed if you pour them from glass to glass to remove the bubbles as these tend to cause discomfort. Avoid alcoholic beverages.  3. Eat very soft foods such as soups, broth, jello, custard, pudding, ice cream, popsicles, applesauce, mashed potatoes, and in general anything that you can crush between your tongue and the roof of your mouth. Try adding Carnation Instant Breakfast Mix into your food for extra  calories. It is not uncommon to lose 5 to 10 pounds of fluid weight. The weight will be gained back quickly once you're feeling better and drinking more.  4. Sleep with your head elevated on two pillows for about three days to help decrease the swelling.  5. DO NOT SMOKE!  Day Two  1. Rest as much as possible. Use common sense in your activities.  2. Continue drinking at least four glasses of liquid per day.  3. Follow the soft diet.  4. Use your pain medication as needed.  Day Three  1. Advance your activity as you are able and continue to follow the previous day's suggestions.  Days Four Through Six  1. Advance your diet and begin to eat more solid foods such as chopped hamburger. 2. Advance your activities slowly. Children should be kept mostly around the house.  3. Not uncommonly, there will be more pain at this time. It is temporary, usually lasting a day or two.  Day Seven Through Ten  1. Most individuals by this time are able to return to work or school unless otherwise instructed.   Consider sending children back to school for a half day on the first day back. 

## 2022-09-05 ENCOUNTER — Ambulatory Visit
Admission: RE | Admit: 2022-09-05 | Discharge: 2022-09-05 | Disposition: A | Discharge status: Home / Self Care | Payer: Medicaid Other | Attending: Otolaryngology | Admitting: Otolaryngology

## 2022-09-05 ENCOUNTER — Ambulatory Visit (AMBULATORY_SURGERY_CENTER): Payer: Medicaid Other | Admitting: Anesthesiology

## 2022-09-05 ENCOUNTER — Encounter
Admission: RE | Disposition: A | Discharge status: Home / Self Care | Payer: Self-pay | Source: Home / Self Care | Attending: Otolaryngology

## 2022-09-05 ENCOUNTER — Encounter: Payer: Self-pay | Admitting: Otolaryngology

## 2022-09-05 ENCOUNTER — Other Ambulatory Visit: Payer: Self-pay

## 2022-09-05 ENCOUNTER — Ambulatory Visit: Payer: Medicaid Other | Admitting: Anesthesiology

## 2022-09-05 DIAGNOSIS — F418 Other specified anxiety disorders: Secondary | ICD-10-CM | POA: Insufficient documentation

## 2022-09-05 DIAGNOSIS — G709 Myoneural disorder, unspecified: Secondary | ICD-10-CM | POA: Diagnosis not present

## 2022-09-05 DIAGNOSIS — J358 Other chronic diseases of tonsils and adenoids: Secondary | ICD-10-CM

## 2022-09-05 DIAGNOSIS — K219 Gastro-esophageal reflux disease without esophagitis: Secondary | ICD-10-CM | POA: Diagnosis not present

## 2022-09-05 DIAGNOSIS — J3501 Chronic tonsillitis: Secondary | ICD-10-CM | POA: Diagnosis present

## 2022-09-05 DIAGNOSIS — Z79899 Other long term (current) drug therapy: Secondary | ICD-10-CM | POA: Diagnosis not present

## 2022-09-05 HISTORY — PX: TONSILLECTOMY: SHX5217

## 2022-09-05 LAB — POCT PREGNANCY, URINE: Preg Test, Ur: NEGATIVE

## 2022-09-05 SURGERY — TONSILLECTOMY
Anesthesia: General | Site: Throat | Laterality: Bilateral

## 2022-09-05 MED ORDER — PREDNISOLONE SODIUM PHOSPHATE 15 MG/5ML PO SOLN: ORAL | 0 refills | Status: DC

## 2022-09-05 MED ORDER — FENTANYL CITRATE (PF) 100 MCG/2ML IJ SOLN
INTRAMUSCULAR | Status: DC | PRN
  Administered 2022-09-05: 25 ug via INTRAVENOUS
  Administered 2022-09-05: 75 ug via INTRAVENOUS

## 2022-09-05 MED ORDER — FENTANYL CITRATE PF 50 MCG/ML IJ SOSY: 25.0000 ug | PREFILLED_SYRINGE | INTRAMUSCULAR | Status: DC | PRN

## 2022-09-05 MED ORDER — BUPIVACAINE HCL 0.25 % IJ SOLN
INTRAMUSCULAR | Status: DC | PRN
  Administered 2022-09-05: 2 mL

## 2022-09-05 MED ORDER — OXYCODONE HCL 5 MG/5ML PO SOLN
5.0000 mg | ORAL | Status: DC | PRN
  Administered 2022-09-05: 5 mg via ORAL

## 2022-09-05 MED ORDER — ONDANSETRON HCL 4 MG/2ML IJ SOLN: 4.0000 mg | Freq: Once | INTRAMUSCULAR | Status: DC | PRN

## 2022-09-05 MED ORDER — HYDROCODONE-ACETAMINOPHEN 7.5-325 MG/15ML PO SOLN: ORAL | 0 refills | Status: DC

## 2022-09-05 MED ORDER — SCOPOLAMINE 1 MG/3DAYS TD PT72
1.0000 | MEDICATED_PATCH | TRANSDERMAL | Status: DC
  Administered 2022-09-05: 1.5 mg via TRANSDERMAL

## 2022-09-05 MED ORDER — SUCCINYLCHOLINE CHLORIDE 200 MG/10ML IV SOSY
PREFILLED_SYRINGE | INTRAVENOUS | Status: DC | PRN
  Administered 2022-09-05: 80 mg via INTRAVENOUS

## 2022-09-05 MED ORDER — PROPOFOL 10 MG/ML IV BOLUS
INTRAVENOUS | Status: DC | PRN
  Administered 2022-09-05: 150 mg via INTRAVENOUS

## 2022-09-05 MED ORDER — KETOROLAC TROMETHAMINE 15 MG/ML IJ SOLN
INTRAMUSCULAR | Status: DC | PRN
  Administered 2022-09-05: 15 mg via INTRAVENOUS

## 2022-09-05 MED ORDER — ACETAMINOPHEN 500 MG PO TABS
1000.0000 mg | ORAL_TABLET | Freq: Four times a day (QID) | ORAL | Status: DC | PRN
  Administered 2022-09-05: 1000 mg via ORAL

## 2022-09-05 MED ORDER — FENTANYL CITRATE PF 50 MCG/ML IJ SOSY
25.0000 ug | PREFILLED_SYRINGE | INTRAMUSCULAR | Status: DC | PRN
  Administered 2022-09-05 (×2): 25 ug via INTRAVENOUS

## 2022-09-05 MED ORDER — MIDAZOLAM HCL 2 MG/2ML IJ SOLN
INTRAMUSCULAR | Status: DC | PRN
  Administered 2022-09-05: 2 mg via INTRAVENOUS

## 2022-09-05 MED ORDER — ONDANSETRON HCL 4 MG/2ML IJ SOLN
4.0000 mg | Freq: Once | INTRAMUSCULAR | Status: AC | PRN
Start: 2022-09-05 — End: 2022-09-05
  Administered 2022-09-05: 4 mg via INTRAVENOUS

## 2022-09-05 MED ORDER — GLYCOPYRROLATE 0.2 MG/ML IJ SOLN
INTRAMUSCULAR | Status: DC | PRN
  Administered 2022-09-05: .4 mg via INTRAVENOUS

## 2022-09-05 MED ORDER — LACTATED RINGERS IV SOLN: INTRAVENOUS | Status: DC | PRN

## 2022-09-05 MED ORDER — DEXAMETHASONE SODIUM PHOSPHATE 4 MG/ML IJ SOLN
INTRAMUSCULAR | Status: DC | PRN
  Administered 2022-09-05: 8 mg via INTRAVENOUS

## 2022-09-05 SURGICAL SUPPLY — 16 items
BLADE ELECT COATED/INSUL 125 (ELECTRODE) ×1 IMPLANT
CANISTER SUCT 1200ML W/VALVE (MISCELLANEOUS) ×1 IMPLANT
CATH ROBINSON RED A/P 10FR (CATHETERS) ×1 IMPLANT
ELECT REM PT RETURN 9FT ADLT (ELECTROSURGICAL) ×1
ELECTRODE REM PT RTRN 9FT ADLT (ELECTROSURGICAL) ×1 IMPLANT
GLOVE SURG ENC MOIS LTX SZ7.5 (GLOVE) ×1 IMPLANT
KIT TURNOVER KIT A (KITS) ×1 IMPLANT
NS IRRIG 500ML POUR BTL (IV SOLUTION) ×1 IMPLANT
PACK TONSIL AND ADENOID CUSTOM (PACKS) ×1 IMPLANT
PENCIL SMOKE EVACUATOR (MISCELLANEOUS) ×1 IMPLANT
SLEEVE SUCTION 125 (MISCELLANEOUS) ×1 IMPLANT
SOL ANTI-FOG 6CC FOG-OUT (MISCELLANEOUS) ×1 IMPLANT
SOL FOG-OUT ANTI-FOG 6CC (MISCELLANEOUS) ×1
SPONGE TONSIL 1 RF SGL (DISPOSABLE) IMPLANT
STRAP BODY AND KNEE 60X3 (MISCELLANEOUS) ×1 IMPLANT
SUCTION COAG ELEC 10 HAND CTRL (ELECTROSURGICAL) IMPLANT

## 2022-09-05 NOTE — Anesthesia Preprocedure Evaluation (Addendum)
Anesthesia Evaluation  Patient identified by MRN, date of birth, ID band Patient awake    Reviewed: Allergy & Precautions, H&P , NPO status , Patient's Chart, lab work & pertinent test results, reviewed documented beta blocker date and time   Airway Mallampati: II  TM Distance: >3 FB Neck ROM: full    Dental  (+) Teeth Intact   Pulmonary neg pulmonary ROS,    Pulmonary exam normal        Cardiovascular negative cardio ROS Normal cardiovascular exam Rhythm:regular Rate:Normal     Neuro/Psych  Headaches, PSYCHIATRIC DISORDERS Anxiety Depression  Neuromuscular disease    GI/Hepatic Neg liver ROS, GERD  Medicated,  Endo/Other  negative endocrine ROS  Renal/GU negative Renal ROS  negative genitourinary   Musculoskeletal   Abdominal   Peds  Hematology negative hematology ROS (+)   Anesthesia Other Findings Past Medical History: 09/2020: BRCA negative     Comment:  MyRisk neg except AXIN2 VUS No date: Family history of adverse reaction to anesthesia     Comment:  heart condition 09/2020: Family history of breast cancer     Comment:  IBIS=15.7%/riskscore=14.7% No date: Family history of pancreatic cancer No date: GERD (gastroesophageal reflux disease) No date: Migraine Past Surgical History: No date: NO PAST SURGERIES No date: WISDOM TOOTH EXTRACTION BMI    Body Mass Index: 28.49 kg/m     Reproductive/Obstetrics negative OB ROS                             Anesthesia Physical Anesthesia Plan  ASA: 2  Anesthesia Plan: General ETT   Post-op Pain Management:    Induction:   PONV Risk Score and Plan: 4 or greater  Airway Management Planned:   Additional Equipment:   Intra-op Plan:   Post-operative Plan:   Informed Consent: I have reviewed the patients History and Physical, chart, labs and discussed the procedure including the risks, benefits and alternatives for the proposed  anesthesia with the patient or authorized representative who has indicated his/her understanding and acceptance.     Dental Advisory Given  Plan Discussed with: CRNA  Anesthesia Plan Comments:        Anesthesia Quick Evaluation

## 2022-09-05 NOTE — H&P (Signed)
Amanda [name], Amanda [name] [id number] 1996/05/08  Date of Admission: @TODAY @ Admitting Physician: Riley Nearing  Chief Complaint: Chronic tonsillitis   HPI: This 26 y.o. year old female with chronic tonsillitis, tonsillolithiasis. Doing well currently, no fevers or URI.   Medications:  Medications Prior to Admission  Medication Sig Dispense Refill   escitalopram (LEXAPRO) 10 MG tablet Take 1 tablet (10 mg total) by mouth daily. Take along with 5 mg daily 90 tablet 0   escitalopram (LEXAPRO) 5 MG tablet Take 1 tablet (5 mg total) by mouth daily. Take along with 10 mg daily 90 tablet 0   hydrOXYzine (VISTARIL) 25 MG capsule TAKE 1-2 CAPSULES (25-50 MG TOTAL) BY MOUTH AT BEDTIME AS NEEDED. FOR ANXIETY AND SLEEP 180 capsule 0   spironolactone (ALDACTONE) 50 MG tablet Take 1 tablet (50 mg total) by mouth daily. 30 tablet 3   topiramate (TOPAMAX) 25 MG tablet Take 1 tablet (25 mg total) by mouth at bedtime. 30 tablet 3   traZODone (DESYREL) 50 MG tablet TAKE 0.5 TABLETS BY MOUTH AT BEDTIME AS NEEDED FOR SLEEP. 45 tablet 0   valACYclovir (VALTREX) 1000 MG tablet Take 1 tablet (1,000 mg total) by mouth daily. 90 tablet 2   Zavegepant HCl (ZAVZPRET) 10 MG/ACT SOLN Place 1 spray into the nose daily as needed. Maximum 1 spray in 24 hours. 8 each 5   Adapalene (DIFFERIN) 0.3 % gel Apply to face qhs, wash off qam 45 g 2   Clindamycin-Benzoyl Per, Refr, (DUAC) gel Apply to face qam, wash off qhs 45 g 2    Allergies:  Allergies  Allergen Reactions   Imitrex [Sumatriptan] Swelling    Facial swelling and burning lips   Penicillins Hives    Did it involve swelling of the face/tongue/throat, SOB, or low BP? No Did it involve sudden or severe rash/hives, skin peeling, or any reaction on the inside of your mouth or nose? Yes Did you need to seek medical attention at a hospital or doctor's office? Yes When did it last happen?      childhood allergy If all above answers are "NO", may proceed with cephalosporin use.     PMH:  Past Medical History:  Diagnosis Date   BRCA negative 09/2020   MyRisk neg except AXIN2 VUS   Family history of adverse reaction to anesthesia    heart condition   Family history of breast cancer 09/2020   IBIS=15.7%/riskscore=14.7%   Family history of pancreatic cancer    GERD (gastroesophageal reflux disease)    Migraine     Fam Hx:  Family History  Problem Relation Age of Onset   Migraines Mother    Skin cancer Father    Asthma Maternal Grandmother    Hypertension Maternal Grandmother    Thyroid disease Maternal Grandmother    Hypertension Maternal Grandfather    Pancreatic cancer Maternal Grandfather 66   Colon cancer Maternal Grandfather 60   Hypertension Paternal Grandmother    Diabetes Paternal Grandmother    Hypertension Paternal Grandfather    Diabetes Paternal Grandfather    Colon cancer Paternal Grandfather 104   Thyroid disease Maternal Aunt    Breast cancer Maternal Aunt 110   Allergies Brother     Soc Hx:  Social History   Socioeconomic History   Marital status: Single    Spouse name: [name]   Number of children: 2   Years of education: Not on file   Highest education level: Associate degree: occupational, Hotel manager, or vocational program  Occupational  History   Occupation: Massage Therapy  Tobacco Use   Smoking status: Never   Smokeless tobacco: Never  Vaping Use   Vaping Use: Never used  Substance and Sexual Activity   Alcohol use: Never   Drug use: No   Sexual activity: Yes    Partners: Male    Birth control/protection: None  Other Topics Concern   Not on file  Social History Narrative   ** Merged History Encounter **       One story home Right-handed Caffeine: occasional coffee   Social Determinants of Health   Financial Resource Strain: Not on file  Food Insecurity: Not on file  Transportation Needs: Not on file  Physical Activity: Not on file  Stress: Stress Concern Present (12/01/2021)   [place]    Feeling of Stress : Rather much  Social Connections: Not on file  Intimate Partner Violence: Not on file    PSH:  Past Surgical History:  Procedure Laterality Date   NO PAST SURGERIES     WISDOM TOOTH EXTRACTION    .   PHYSICAL EXAM  Vitals: Blood pressure 107/67, pulse 89, temperature 98.2 F (36.8 C), height 5' 4"  (1.626 m), weight 75.3 kg, last menstrual period 08/16/2022, SpO2 98 %.. General: Well-developed, Well-nourished in no acute distress Mood: Mood and affect well adjusted, pleasant and cooperative. Orientation: Grossly alert and oriented. Vocal Quality: No hoarseness. Communicates verbally. head and Face: NCAT. No facial asymmetry. No visible skin lesions. No significant facial scars. No tenderness with sinus percussion. Facial strength normal and symmetric. Respiratory: Normal respiratory effort without labored breathing. Lungs CTA bilaterally Cardiovascular: Heart exam shows regular rate and rhythm  ASSESSMENT: Chronic tonsillitis, tonsillolithiasis  PLAN: Adenotonsillectomy   Riley Nearing 09/05/2022 11:27 AM

## 2022-09-05 NOTE — Anesthesia Procedure Notes (Signed)
Procedure Name: Intubation Date/Time: 09/05/2022 12:02 PM  Performed by: Tobie Poet, CRNAPre-anesthesia Checklist: Patient identified, Emergency Drugs available, Suction available and Patient being monitored Patient Re-evaluated:Patient Re-evaluated prior to induction Oxygen Delivery Method: Circle system utilized Preoxygenation: Pre-oxygenation with 100% oxygen Induction Type: IV induction Ventilation: Mask ventilation without difficulty Laryngoscope Size: Mac and 3 Grade View: Grade I Tube type: Oral Tube size: 6.0 mm Number of attempts: 1 Airway Equipment and Method: Stylet and Oral airway Placement Confirmation: ETT inserted through vocal cords under direct vision, positive ETCO2 and breath sounds checked- equal and bilateral Tube secured with: Tape Dental Injury: Teeth and Oropharynx as per pre-operative assessment

## 2022-09-05 NOTE — Op Note (Signed)
09/05/2022  12:08 PM    Mare Loan  [id number]   Pre-Op Diagnosis:  Tonsillolithiasis  Post-op Diagnosis: Tonsillolithiasis  Procedure: Tonsillectomy  Surgeon:  Riley Nearing., MD  Anesthesia:  General endotracheal  EBL:  Less than 25 cc  Complications:  None  Findings: 2+ cryptic tonsils  Procedure: The patient was taken to the Operating Room and placed in the supine position.  After induction of general endotracheal anesthesia, the table was turned 90 degrees and the patient was draped in the usual fashion  with the eyes protected.  A mouth gag was inserted into the oral cavity to open the mouth, and examination of the oropharynx showed the uvula was non-bifid. The palate was palpated, and there was no evidence of submucous cleft. Examination of the nasopharynx showed no obstructing adenoids. The right tonsil was grasped with an Allis clamp and resected from the tonsillar fossa in the usual fashion with the Bovie. The left tonsil was resected in the same fashion. The Bovie was used to obtain hemostasis. Each tonsillar fossa was then carefully injected with 0.25% marcaine , avoiding intravascular injection. The nose and throat were irrigated and suctioned to remove any  blood clot. The mouth gag was  removed with no evidence of active bleeding.  The patient was then returned to the anesthesiologist for awakening, and was taken to the Recovery Room in stable condition.  Cultures:  None.  Specimens:  Tonsils.  Disposition:   PACU to home  Plan: Soft, bland diet and push fluids. Take pain medications and prednisolone as prescribed. No strenuous activity for 2 weeks. Follow-up in 3 weeks.  Riley Nearing 09/05/2022 12:08 PM

## 2022-09-05 NOTE — Transfer of Care (Signed)
Immediate Anesthesia Transfer of Care Note  Patient: Amanda [name]  Procedure(s) Performed: TONSILLECTOMY (Bilateral: Throat)  Patient Location: PACU  Anesthesia Type: General ETT  Level of Consciousness: awake, alert  and patient cooperative  Airway and Oxygen Therapy: Patient Spontanous Breathing and Patient connected to supplemental oxygen  Post-op Assessment: Post-op Vital signs reviewed, Patient's Cardiovascular Status Stable, Respiratory Function Stable, Patent Airway and No signs of Nausea or vomiting  Post-op Vital Signs: Reviewed and stable  Complications: No notable events documented.

## 2022-09-05 NOTE — Anesthesia Postprocedure Evaluation (Signed)
Anesthesia Post Note  Patient: Amanda [name]  Procedure(s) Performed: TONSILLECTOMY (Bilateral: Throat)     Patient location during evaluation: PACU Anesthesia Type: General Level of consciousness: awake and alert Pain management: pain level controlled Vital Signs Assessment: post-procedure vital signs reviewed and stable Respiratory status: spontaneous breathing, nonlabored ventilation, respiratory function stable and patient connected to nasal cannula oxygen Cardiovascular status: blood pressure returned to baseline and stable Postop Assessment: no apparent nausea or vomiting Anesthetic complications: no   No notable events documented.  Molli Barrows

## 2022-09-06 ENCOUNTER — Encounter: Payer: Self-pay | Admitting: Otolaryngology

## 2022-09-06 ENCOUNTER — Encounter: Payer: Self-pay | Admitting: Physician Assistant

## 2022-09-07 LAB — SURGICAL PATHOLOGY

## 2022-09-10 ENCOUNTER — Encounter: Payer: Self-pay | Admitting: Physician Assistant

## 2022-09-12 ENCOUNTER — Encounter: Payer: Self-pay | Admitting: Physician Assistant

## 2022-09-12 ENCOUNTER — Telehealth: Payer: Self-pay

## 2022-09-12 ENCOUNTER — Other Ambulatory Visit (HOSPITAL_COMMUNITY): Payer: Self-pay

## 2022-09-12 NOTE — Telephone Encounter (Signed)
Medication Samples have been provided to the patient.  Drug name: Zavzpret       Strength: 10 mg        Qty: 2  LOT: [id number]  Exp.Date: 01/2024  Dosing instructions: as needed  The patient has been instructed regarding the correct time, dose, and frequency of taking this medication, including desired effects and most common side effects.   Venetia Night 8:51 AM 09/12/2022

## 2022-09-12 NOTE — Telephone Encounter (Signed)
Patient Advocate Encounter  Prior Authorization for Zavzpret '10MG'$ /ACT solution has been approved.    Effective: 09-12-2022 to 09-12-2023  Approved quantity: 8 spray per 8 day(s). You may fill up to a 34 day supply at a retail pharmacy. You may fill up to a 90 day supply for maintenance drugs, please refer to the formulary for details.

## 2022-09-12 NOTE — Telephone Encounter (Signed)
Patient advised.

## 2022-09-12 NOTE — Telephone Encounter (Signed)
Patient Advocate Encounter   Received notification that prior authorization is required for Zavzpret '10MG'$ /ACT solution  Submitted: 09-12-2022 Key [id number]  Status is pending

## 2022-09-12 NOTE — Telephone Encounter (Signed)
PA started, will update when receive determination. Key: [id number]

## 2022-09-12 NOTE — Telephone Encounter (Signed)
Please start a PA for Zavpret nasal spray.

## 2022-09-13 ENCOUNTER — Other Ambulatory Visit: Payer: Self-pay | Admitting: Physician Assistant

## 2022-09-13 DIAGNOSIS — A6000 Herpesviral infection of urogenital system, unspecified: Secondary | ICD-10-CM

## 2022-09-13 MED ORDER — TRIAMCINOLONE ACETONIDE 0.1 % EX LOTN: 1.0000 | TOPICAL_LOTION | Freq: Two times a day (BID) | CUTANEOUS | 0 refills | Status: DC

## 2022-09-13 MED ORDER — MUPIROCIN 2 % EX OINT: 1.0000 | TOPICAL_OINTMENT | Freq: Two times a day (BID) | CUTANEOUS | 0 refills | Status: DC

## 2022-09-19 ENCOUNTER — Other Ambulatory Visit: Payer: Self-pay

## 2022-09-19 ENCOUNTER — Other Ambulatory Visit: Payer: Self-pay | Admitting: Neurology

## 2022-09-19 MED ORDER — ZAVZPRET 10 MG/ACT NA SOLN: 1.0000 | Freq: Every day | NASAL | 5 refills | Status: DC | PRN

## 2022-09-28 NOTE — Progress Notes (Deleted)
  [facility] [address] [place] Phone: [phone] Subjective:    I'm seeing this patient by the request  of:  [name], PA-C  CC:   [id number]  Amanda [name] is a 26 y.o. female coming in with complaint of back and neck pain. OMT 08/29/2022. Patient states   Medications patient has been prescribed: None  Taking:         Reviewed prior external information including notes and imaging from previsou exam, outside providers and external EMR if available.   As well as notes that were available from care everywhere and other healthcare systems.  Past medical history, social, surgical and family history all reviewed in electronic medical record.  No pertanent information unless stated regarding to the chief complaint.   Past Medical History:  Diagnosis Date   BRCA negative 09/2020   MyRisk neg except AXIN2 VUS   Family history of adverse reaction to anesthesia    heart condition   Family history of breast cancer 09/2020   IBIS=15.7%/riskscore=14.7%   Family history of pancreatic cancer    GERD (gastroesophageal reflux disease)    Migraine     Allergies  Allergen Reactions   Imitrex [Sumatriptan] Swelling    Facial swelling and burning lips   Penicillins Hives    Did it involve swelling of the face/tongue/throat, SOB, or low BP? No Did it involve sudden or severe rash/hives, skin peeling, or any reaction on the inside of your mouth or nose? Yes Did you need to seek medical attention at a hospital or doctor's office? Yes When did it last happen?      childhood allergy If all above answers are "NO", may proceed with cephalosporin use.     Review of Systems:  No headache, visual changes, nausea, vomiting, diarrhea, constipation, dizziness, abdominal pain, skin rash, fevers, chills, night sweats, weight loss, swollen lymph nodes, body aches, joint swelling, chest pain, shortness of breath, mood changes. POSITIVE  muscle aches  Objective  Last menstrual period 08/16/2022.   General: No apparent distress alert and oriented x3 mood and affect normal, dressed appropriately.  HEENT: Pupils equal, extraocular movements intact  Respiratory: Patient's speak in full sentences and does not appear short of breath  Cardiovascular: No lower extremity edema, non tender, no erythema  Gait MSK:  Back   Osteopathic findings  C2 flexed rotated and side bent right C6 flexed rotated and side bent left T3 extended rotated and side bent right inhaled rib T9 extended rotated and side bent left L2 flexed rotated and side bent right Sacrum right on right       Assessment and Plan:  No problem-specific Assessment & Plan notes found for this encounter.    Nonallopathic problems  Decision today to treat with OMT was based on Physical Exam  After verbal consent patient was treated with HVLA, ME, FPR techniques in cervical, rib, thoracic, lumbar, and sacral  areas  Patient tolerated the procedure well with improvement in symptoms  Patient given exercises, stretches and lifestyle modifications  See medications in patient instructions if given  Patient will follow up in 4-8 weeks             Note: This dictation was prepared with Dragon dictation along with smaller phrase technology. Any transcriptional errors that result from this process are unintentional.

## 2022-10-04 ENCOUNTER — Ambulatory Visit: Payer: Medicaid Other | Admitting: Family Medicine

## 2022-10-09 ENCOUNTER — Ambulatory Visit: Payer: Medicaid Other | Admitting: Dermatology

## 2022-10-10 ENCOUNTER — Telehealth (HOSPITAL_COMMUNITY): Payer: Self-pay | Admitting: Licensed Clinical Social Worker

## 2022-10-10 ENCOUNTER — Ambulatory Visit (INDEPENDENT_AMBULATORY_CARE_PROVIDER_SITE_OTHER): Payer: Medicaid Other | Admitting: Licensed Clinical Social Worker

## 2022-10-10 DIAGNOSIS — Z91199 Patient's noncompliance with other medical treatment and regimen due to unspecified reason: Secondary | ICD-10-CM

## 2022-10-10 NOTE — Telephone Encounter (Signed)
LCSW counselor attempted to connect with patient for scheduled appointment via MyChart video text request x 2 and email request with no response; also attempted to connect via phone--pt answered phone and stated that she was in the middle of something with her children and cannot stop. Pt elects to reschedule appointment.  Attempt 1: Text and email: 9:05a  Attempt 2: Text and email: 9:11a  Attempt 3: phone call: 9:15a  Pt is aware there will be a no show fee for last-minute reschedule.

## 2022-10-10 NOTE — Progress Notes (Signed)
LCSW counselor attempted to connect with patient for scheduled appointment via MyChart video text request x 2 and email request with no response; also attempted to connect via phone--pt answered phone and stated that she was in the middle of something with her children and cannot stop. Pt elects to reschedule appointment.  Attempt 1: Text and email: 9:05a  Attempt 2: Text and email: 9:11a  Attempt 3: phone call: 9:15a  Pt is aware there will be a no show fee for last-minute reschedule.

## 2022-10-24 ENCOUNTER — Encounter: Payer: Self-pay | Admitting: Physician Assistant

## 2022-10-26 ENCOUNTER — Encounter: Payer: Self-pay | Admitting: Physician Assistant

## 2022-10-26 ENCOUNTER — Ambulatory Visit (INDEPENDENT_AMBULATORY_CARE_PROVIDER_SITE_OTHER): Payer: Medicaid Other | Admitting: Physician Assistant

## 2022-10-26 VITALS — BP 108/69 | HR 81 | Temp 99.2°F | Resp 16 | Wt 169.0 lb

## 2022-10-26 DIAGNOSIS — J0110 Acute frontal sinusitis, unspecified: Secondary | ICD-10-CM

## 2022-10-26 MED ORDER — DOXYCYCLINE HYCLATE 100 MG PO TABS: 100.0000 mg | ORAL_TABLET | Freq: Two times a day (BID) | ORAL | 0 refills | Status: DC

## 2022-10-26 NOTE — Progress Notes (Signed)
I,Amanda [name],acting as a Education administrator for [employer], PA-C.,have documented all relevant documentation on the behalf of Amanda Kirschner, PA-C,as directed by  Amanda Kirschner, PA-C while in the presence of Amanda Kirschner, PA-C.   Established patient visit   Patient: Amanda [name]   DOB: 12/31/1995   26 y.o. Female  MRN: [id number] Visit Date: 10/26/2022  Today's healthcare provider: [name], PA-C   Chief Complaint  Patient presents with   Cough   Subjective    HPI  Pt reports headache, sinus pressure, thick mucous, cough, nasal congestion x 6 days. Reports sore throat, fatigue. Denies covid testing. Denies sick contacts.  Denies fevers, chills.   Medications: Outpatient Medications Prior to Visit  Medication Sig   Adapalene (DIFFERIN) 0.3 % gel Apply to face qhs, wash off qam   Clindamycin-Benzoyl Per, Refr, (DUAC) gel Apply to face qam, wash off qhs   escitalopram (LEXAPRO) 10 MG tablet Take 1 tablet (10 mg total) by mouth daily. Take along with 5 mg daily   escitalopram (LEXAPRO) 5 MG tablet Take 1 tablet (5 mg total) by mouth daily. Take along with 10 mg daily   HYDROcodone-acetaminophen (HYCET) 7.5-325 mg/15 ml solution 10-15 cc PO every 4-6 hours as needed for pain.   hydrOXYzine (VISTARIL) 25 MG capsule TAKE 1-2 CAPSULES (25-50 MG TOTAL) BY MOUTH AT BEDTIME AS NEEDED. FOR ANXIETY AND SLEEP   mupirocin ointment (BACTROBAN) 2 % Apply 1 Application topically 2 (two) times daily.   prednisoLONE (ORAPRED) 15 MG/5ML solution 10 cc PO BID x 3 days, then 5 cc PO BID x 3 days, then 5 cc PO QD x 3 days   spironolactone (ALDACTONE) 50 MG tablet Take 1 tablet (50 mg total) by mouth daily.   topiramate (TOPAMAX) 25 MG tablet Take 1 tablet (25 mg total) by mouth at bedtime.   traZODone (DESYREL) 50 MG tablet TAKE 0.5 TABLETS BY MOUTH AT BEDTIME AS NEEDED FOR SLEEP.   triamcinolone lotion (KENALOG) 0.1 % Apply 1 Application topically 2 (two) times daily.   valACYclovir (VALTREX)  1000 MG tablet Take 1 tablet (1,000 mg total) by mouth daily.   Zavegepant HCl (ZAVZPRET) 10 MG/ACT SOLN Place 1 spray into the nose daily as needed. Maximum 1 spray in 24 hours.   No facility-administered medications prior to visit.    Review of Systems  Constitutional:  Negative for fatigue and fever.  HENT:  Positive for congestion, postnasal drip, rhinorrhea and sinus pain.   Respiratory:  Positive for cough. Negative for shortness of breath.   Cardiovascular:  Negative for chest pain and leg swelling.  Gastrointestinal:  Negative for abdominal pain.  Neurological:  Negative for dizziness and headaches.       Objective    BP 108/69 (BP Location: Left Arm, Patient Position: Sitting, Cuff Size: Normal)   Pulse 81   Temp 99.2 F (37.3 C) (Oral)   Resp 16   Wt 169 lb (76.7 kg)   SpO2 98%   BMI 29.01 kg/m    Physical Exam Constitutional:      General: She is awake.     Appearance: She is well-developed.  HENT:     Head: Normocephalic.     Right Ear: Tympanic membrane normal.     Left Ear: Tympanic membrane normal.     Nose: Congestion and rhinorrhea present.     Mouth/Throat:     Pharynx: Posterior oropharyngeal erythema present. No oropharyngeal exudate.  Eyes:     Conjunctiva/sclera: Conjunctivae  normal.  Cardiovascular:     Rate and Rhythm: Normal rate and regular rhythm.     Heart sounds: Normal heart sounds.  Pulmonary:     Effort: Pulmonary effort is normal.     Breath sounds: Normal breath sounds. No wheezing.  Skin:    General: Skin is warm.  Neurological:     Mental Status: She is alert and oriented to person, place, and time.  Psychiatric:        Attention and Perception: Attention normal.        Mood and Affect: Mood normal.        Speech: Speech normal.        Behavior: Behavior is cooperative.      No results found for any visits on 10/26/22.  Assessment & Plan     Acute sinusitis Offered covid test, pt declined, would not use antiviral  regardless as this is day 6-7 Pcn all, rx doxycycline bid x 7 days Use flonase, advised saline nasal rinses   Return if symptoms worsen or fail to improve.     I, Amanda Kirschner, PA-C have reviewed all documentation for this visit. The documentation on  10/26/2022  for the exam, diagnosis, procedures, and orders are all accurate and complete.  Amanda Kirschner, PA-C [facility] [address] [place], [place], [address] Office: [phone] Fax: [place]

## 2022-10-30 ENCOUNTER — Encounter: Payer: Self-pay | Admitting: Physician Assistant

## 2022-10-31 ENCOUNTER — Other Ambulatory Visit: Payer: Self-pay | Admitting: Physician Assistant

## 2022-10-31 MED ORDER — CEFDINIR 300 MG PO CAPS: 300.0000 mg | ORAL_CAPSULE | Freq: Two times a day (BID) | ORAL | 0 refills | Status: AC

## 2022-11-06 NOTE — Progress Notes (Unsigned)
Amanda [name] Phone: [phone] Subjective:   Amanda [name], am serving as a scribe for Dr. Hulan Saas.  I'm seeing this patient by the request  of:  Amanda Kirschner, PA-C  CC: Back and neck pain follow-up  [id number]  Amanda [name] is a 26 y.o. female coming in with complaint of back and neck pain. OMT 08/29/2022. Patient states that she has a lot of pain when lying down and she goes to roll over. Pain in R side of lumbar spine. Pain will stay with her the entire day. Has tried massage but this causes burning sensation.   Was in ED last week for migraine. Has some visual changes. Neck has also been tight causing more frequent episodes.  Medications patient has been prescribed: None  Taking:         Reviewed prior external information including notes and imaging from previsou exam, outside providers and external EMR if available.   As well as notes that were available from care everywhere and other healthcare systems.  Past medical history, social, surgical and family history all reviewed in electronic medical record.  [name] pertanent information unless stated regarding to the chief complaint.   Past Medical History:  Diagnosis Date   BRCA negative 09/2020   MyRisk neg except AXIN2 VUS   Family history of adverse reaction to anesthesia    heart condition   Family history of breast cancer 09/2020   IBIS=15.7%/riskscore=14.7%   Family history of pancreatic cancer    GERD (gastroesophageal reflux disease)    Migraine     Allergies  Allergen Reactions   Imitrex [Sumatriptan] Swelling    Facial swelling and burning lips   Penicillins Hives    Did it involve swelling of the face/tongue/throat, SOB, or low BP? [name] Did it involve sudden or severe rash/hives, skin peeling, or any reaction on the inside of your mouth or nose? Yes Did you need to seek medical attention at a hospital or doctor's office?  Yes When did it last happen?      childhood allergy If all above answers are "[name]", may proceed with cephalosporin use.     Review of Systems:  [name] headache, visual changes, nausea, vomiting, diarrhea, constipation, dizziness, abdominal pain, skin rash, fevers, chills, night sweats, weight loss, swollen lymph nodes, body aches, joint swelling, chest pain, shortness of breath, mood changes. POSITIVE muscle aches, body aches, joint swelling  Objective  Blood pressure 98/68, pulse (!) 110, height _0  (1.626 m), weight 171 lb (77.6 kg), SpO2 98 %.   General: [name] alert and oriented x3 mood and affect normal, dressed appropriately.  HEENT: Pupils equal, extraocular movements intact  Respiratory: Patient's speak in full sentences and does not appear short of breath  Cardiovascular: [name] lower extremity edema, non tender, [name] erythema  Gait MSK:  Back does have some mild loss of lordosis.  Tightness noted in the paraspinal musculature of the lumbar spine seems to be on the higher lumbars and L1-L2 on the right side.  Osteopathic findings  C2 flexed rotated and side bent right C6 flexed rotated and side bent right T3 extended rotated and side bent right inhaled rib T7 extended rotated and side bent left L1 flexed rotated and side bent right Sacrum right on right       Assessment and Plan:  Neck pain Neck exam does have some mild loss of lordosis.  Still has fullness of the  thyroid noted.  [name] significant masses noted.  Discussed with patient about icing regimen and home exercises.  Discussed to monitor for any association with food.  Discussed possible other laboratory workup.  Due to the severity of the pain in the back pain we will get x-rays to rule out any other bony abnormality that could be contributing.  Follow-up with me again in 6 to 8 weeks. Total time with patient reviewing patient's previous MRI of the brain, previous abdominal ultrasounds for the gallbladder  polyp as well as discussed with patient 32 minutes this does not include the osteopathic manipulation.  Nonallopathic problems  Decision today to treat with OMT was based on Physical Exam  After verbal consent patient was treated with HVLA, ME, FPR techniques in cervical, rib, thoracic, lumbar, and sacral  areas  Patient tolerated the procedure well with improvement in symptoms  Patient given exercises, stretches and lifestyle modifications  See medications in patient instructions if given  Patient will follow up in 4-8 weeks    The above documentation has been reviewed and is accurate and complete [name], DO          Note: This dictation was prepared with Dragon dictation along with smaller phrase technology. Any transcriptional errors that result from this process are unintentional.

## 2022-11-08 ENCOUNTER — Ambulatory Visit (INDEPENDENT_AMBULATORY_CARE_PROVIDER_SITE_OTHER): Payer: Medicaid Other

## 2022-11-08 ENCOUNTER — Encounter: Payer: Self-pay | Admitting: Family Medicine

## 2022-11-08 ENCOUNTER — Ambulatory Visit (INDEPENDENT_AMBULATORY_CARE_PROVIDER_SITE_OTHER): Payer: Medicaid Other | Admitting: Family Medicine

## 2022-11-08 ENCOUNTER — Other Ambulatory Visit: Payer: Self-pay | Admitting: Psychiatry

## 2022-11-08 VITALS — BP 98/68 | HR 110 | Ht 64.0 in | Wt 171.0 lb

## 2022-11-08 DIAGNOSIS — M545 Low back pain, unspecified: Secondary | ICD-10-CM

## 2022-11-08 DIAGNOSIS — M9903 Segmental and somatic dysfunction of lumbar region: Secondary | ICD-10-CM | POA: Diagnosis not present

## 2022-11-08 DIAGNOSIS — M9908 Segmental and somatic dysfunction of rib cage: Secondary | ICD-10-CM

## 2022-11-08 DIAGNOSIS — M9904 Segmental and somatic dysfunction of sacral region: Secondary | ICD-10-CM | POA: Diagnosis not present

## 2022-11-08 DIAGNOSIS — M9902 Segmental and somatic dysfunction of thoracic region: Secondary | ICD-10-CM

## 2022-11-08 DIAGNOSIS — M542 Cervicalgia: Secondary | ICD-10-CM | POA: Diagnosis not present

## 2022-11-08 DIAGNOSIS — G4700 Insomnia, unspecified: Secondary | ICD-10-CM

## 2022-11-08 DIAGNOSIS — M9901 Segmental and somatic dysfunction of cervical region: Secondary | ICD-10-CM | POA: Diagnosis not present

## 2022-11-08 NOTE — Assessment & Plan Note (Signed)
Neck exam does have some mild loss of lordosis.  Still has fullness of the thyroid noted.  No significant masses noted.  Discussed with patient about icing regimen and home exercises.  Discussed to monitor for any association with food.  Discussed possible other laboratory workup.  Due to the severity of the pain in the back pain we will get x-rays to rule out any other bony abnormality that could be contributing.  Follow-up with me again in 6 to 8 weeks.

## 2022-11-08 NOTE — Patient Instructions (Signed)
Xray on way out Stay active Watch for association with foods [email or web address] See me in 5-6 weeks

## 2022-11-09 ENCOUNTER — Encounter: Payer: Self-pay | Admitting: Physician Assistant

## 2022-11-19 ENCOUNTER — Other Ambulatory Visit: Payer: Self-pay | Admitting: Psychiatry

## 2022-11-19 DIAGNOSIS — F3342 Major depressive disorder, recurrent, in full remission: Secondary | ICD-10-CM

## 2022-11-19 DIAGNOSIS — F411 Generalized anxiety disorder: Secondary | ICD-10-CM

## 2022-11-20 ENCOUNTER — Telehealth: Payer: Self-pay | Admitting: Psychiatry

## 2022-11-20 DIAGNOSIS — F411 Generalized anxiety disorder: Secondary | ICD-10-CM

## 2022-11-20 DIAGNOSIS — F3342 Major depressive disorder, recurrent, in full remission: Secondary | ICD-10-CM

## 2022-11-20 MED ORDER — ESCITALOPRAM OXALATE 5 MG PO TABS: 5.0000 mg | ORAL_TABLET | Freq: Every day | ORAL | 0 refills | Status: DC

## 2022-11-20 NOTE — Telephone Encounter (Signed)
I have sent Lexapro 10 mg and Lexapro 5 mg to pharmacy.

## 2022-11-21 ENCOUNTER — Ambulatory Visit (INDEPENDENT_AMBULATORY_CARE_PROVIDER_SITE_OTHER): Payer: Medicaid Other | Admitting: Licensed Clinical Social Worker

## 2022-11-21 DIAGNOSIS — F411 Generalized anxiety disorder: Secondary | ICD-10-CM | POA: Diagnosis not present

## 2022-11-21 NOTE — Progress Notes (Signed)
Virtual Visit via Video Note  I connected with Amanda [name] on 11/21/22 at  9:00 AM EST by a video enabled telemedicine application and verified that I am speaking with the correct person using two identifiers.  Location: Patient: home Provider: remote office [place], [place])   I discussed the limitations of evaluation and management by telemedicine and the availability of in person appointments. The patient expressed understanding and agreed to proceed.  I discussed the assessment and treatment plan with the patient. The patient was provided an opportunity to ask questions and all were answered. The patient agreed with the plan and demonstrated an understanding of the instructions.   The patient was advised to call back or seek an in-person evaluation if the symptoms worsen or if the condition fails to improve as anticipated.  I provided 50 minutes of non-face-to-face time during this encounter.   [place], LCSW   THERAPIST PROGRESS NOTE  Session Time: [id number]  Participation Level: Active  Behavioral Response: NeatAlertAnxious  Type of Therapy: Individual Therapy  Treatment Goals addressed: Problem: Anxiety Disorder CCP Problem  1 Reduce overall frequency, intensity, and duration of the anxiety so that daily functioning is not impaired per pt self report 3 out of 5 sessions documented.   Goal: LTG: Patient will score less than 5 on the Generalized Anxiety Disorder 7 Scale (GAD-7) Outcome: Progressing Goal: STG: Patient will participate in at least 80% of scheduled individual psychotherapy sessions Outcome: Progressing Intervention: Encourage verbalization of feelings/concerns/expectations Intervention: Encourage self-care activities Intervention: Assist with relaxation techniques, as appropriate (deep breathing exercises, meditation, guided imagery) Intervention: REVIEW PLEASE SKILLS (TREAT PHYSICAL ILLNESS, BALANCE EATING, AVOID MOOD-ALTERING SUBSTANCES, BALANCE  SLEEP AND GET EXERCISE) WITH [name] Intervention: WORK WITH [name] TO TRACK SYMPTOMS, TRIGGERS AND/OR SKILL USE THROUGH A MOOD CHART, DIARY CARD, OR JOURNAL   ProgressTowards Goals: Progressing  Interventions: CBT  Summary: Amanda [name] is a 26 y.o. female who presents with continuing symptoms related to anxiety.    Allowed pt to explore and express thoughts and feelings associated with recent life situations and external stressors.Patient reports that she has been dealing with the physical pain of having some headaches, feeling over emotional, and has been dealing with ongoing family related drama. Patient reports that she has had some drama happen between her fiancees mother and herself, and between her fiancees ex partner and herself. Patient reports that these individuals have been putting negative comments on social media, and have been contacting her trying to trigger her. Patient states that she tries really hard to "fix things".   Patient reports that because of this, she is feeling more nervous, anxious, having more physical tension, and overall more anxious. Patient having concerns that medication is not managing her symptoms as well as she thinks that they should. Patient made the statement "I am never in a calm state."  Used psycho educational tools to educate patient about anxiety, and how medication and relaxation interventions assist in managing anxiety, and not eliminating anxiety. Patient states that she is trying hard to engage in calming activities, socially engaged with others, and even tries to regulate emotions before getting into an argument. Discuss the difference between conflict resolution and personal attacks. Recommended the book "disarming the narcissist" as a reference tool about setting boundaries and limits with others.  Continued recommendations are as follows: self care behaviors, positive social engagements, focusing on overall work/home/life balance, and focusing on  positive physical and emotional wellness.   Suicidal/Homicidal: No  Therapist Response:  Pt is continuing to apply interventions learned in session into daily life situations. Pt is currently on track to meet goals utilizing interventions mentioned above. Personal growth and progress noted. Treatment to continue as indicated.    Plan: Return again in 4 weeks.  Diagnosis:  Encounter Diagnosis  Name Primary?   GAD (generalized anxiety disorder) Yes    Collaboration of Care: Other pt encouraged to continue with psychiatrist of record, Dr. Ursula Alert  Patient/Guardian was advised Release of Information must be obtained prior to any record release in order to collaborate their care with an outside provider. Patient/Guardian was advised if they have not already done so to contact the registration department to sign all necessary forms in order for [place] to release information regarding their care.   Consent: Patient/Guardian gives verbal consent for treatment and assignment of benefits for services provided during this visit. Patient/Guardian expressed understanding and agreed to proceed.   [place], LCSW 11/21/2022

## 2022-11-22 NOTE — Progress Notes (Signed)
NEUROLOGY FOLLOW UP OFFICE NOTE  Amanda [name] [id number]  Assessment/Plan:   Migraine without aura, without status migrainosus, intractable - cervicogenic   1.Migraine prevention:  Increase topiramate to 66m at bedtime.  We can increase to 739mat bedtime in 4 weeks if needed 2.Migraine rescue:  Zavzpret 3.Limit use of pain relievers to no more than 2 days out of week to prevent risk of rebound or medication-overuse headache. 4.Keep headache diary 5.Continue OMM 6.Follow up 4 to 5 months.   Subjective:  [name] a [age]ear old right-handed female who follows up for migraines.   UPDATE: Started topiramate last visit. Tried Zavzpret NS.  Effective but has an after taste.   Intensity:  severe Duration:  15-20 minutes with Zavzpret Frequency:  8 days a month (cannot function 4 to 5 days)  Last month she had migraines every other day.  One migraine was associated with visual aura (she couldn't seen out of her left eye and saw stars out of her right eye.  She went to the ED and received a headache cocktail.  Migraines may have been aggravated by a sinus infection for which she took doxycycline, for which has previously been a trigger for migraines.  Also her neck pain has been aggravated.    Current NSAIDS/analgesics:  none Current triptans:  none Current ergotamine:  none Current anti-emetic:  none Current muscle relaxants:  none Current Antihypertensive medications:  none Current Antidepressant medications:  Lexapro 5g Current Anticonvulsant medications:  none Current anti-CGRP:  Ubrelvy 10043murrent Vitamins/Herbal/Supplements:  none Current Antihistamines/Decongestants:  Zyrtec Other therapy:  OMM for neck pain Hormone/birth control:  none Anti-anxiety:  Hydroxyzine Sleep:  trazodone 50m53mS  Caffeine:  No coffee.  Sometimes a Red Bull Diet:  Hydrates with water.  Tries not to skip meals.  Occasional soda Exercise:  Walks routine Depression:  improved;  Anxiety:  no Other pain:  no Sleep hygiene:  At least 6-7 hours   HISTORY:  Migraines since teenager.  They are bifrontal/facial pressure with photophobia, phonophobia, dizziness and sometimes nausea.  In February 2018, she had a migraine where she lost vision in the left eye.  MRI of brain with and without contrast on 01/29/2017 personally reviewed was normal.  They would last 4-5 hours and occur every 2 to 3 days.. Every 2 to 3 days, not as severe, 4 to 5 hours.  Improved during pregnancy in 2020 but became worse following birth of her twins.  Since late 2020, they are daily, lasting 4-5 hours (but within 30 minutes with Nurtec).  They are moderate in the morning and gradually become more severe later in the day with worsening dizziness.  Triggers include smells (perfumes, cleaning fluids), skipped meals, computer screen time.  She takes Tylenol once a day to every other day.       Past NSAIDS/analgesics:  Ibuprofen (contraindicated - stomach), Excedrin, acetaminophen Past abortive triptans:  Sumatriptan (lip and tongue swelling), rizatriptan (drowsiness) Past abortive ergotamine:  none Past muscle relaxants:  none Past anti-emetic:  Zofran 4mg 75mt antihypertensive medications:  none Past antidepressant medications:  Amitriptyline (drowsiness), sertraline Past anticonvulsant medications:  gabapentin Past anti-CGRP:  Emgality (painful), Nurtec (ineffective) Past vitamins/Herbal/Supplements:  none Past antihistamines/decongestants:  none     Family history of headache:  Amanda [name] (chronic migraines)  PAST MEDICAL HISTORY: Past Medical History:  Diagnosis Date   BRCA negative 09/2020   MyRisk neg except AXIN2 VUS   Family history of adverse reaction to anesthesia  heart condition   Family history of breast cancer 09/2020   IBIS=15.7%/riskscore=14.7%   Family history of pancreatic cancer    GERD (gastroesophageal reflux disease)    Migraine     MEDICATIONS: Current Outpatient  Medications on File Prior to Visit  Medication Sig Dispense Refill   Adapalene (DIFFERIN) 0.3 % gel Apply to face qhs, wash off qam 45 g 2   Clindamycin-Benzoyl Per, Refr, (DUAC) gel Apply to face qam, wash off qhs 45 g 2   escitalopram (LEXAPRO) 10 MG tablet Take 1.5 tablets (15 mg total) by mouth daily. Dose change 135 tablet 0   gabapentin (NEURONTIN) 300 MG capsule Take 1 capsule (300 mg total) by mouth at bedtime. 30 capsule 5   hydrOXYzine (VISTARIL) 25 MG capsule TAKE 1-2 CAPSULES (25-50 MG TOTAL) BY MOUTH AT BEDTIME AS NEEDED. FOR ANXIETY AND SLEEP 180 capsule 0   methylPREDNISolone (MEDROL DOSEPAK) 4 MG TBPK tablet Take 6 pills on day 1, 5 pills on day 2, 4 pills on day 3, 3 pills on day 4, 2 pills on day 5, 1 pill on day 6 1 each 0   spironolactone (ALDACTONE) 50 MG tablet Take 1 tablet (50 mg total) by mouth daily. 30 tablet 3   traZODone (DESYREL) 50 MG tablet Take 0.5 tablets (25 mg total) by mouth at bedtime as needed for sleep. 15 tablet 1   Ubrogepant (UBRELVY) 100 MG TABS Take 1 tablet by mouth as needed (May repeat after 2 hours.  Maximum 2 tablets in 24 hours.). 10 tablet 5   valACYclovir (VALTREX) 1000 MG tablet Take 1 tablet (1,000 mg total) by mouth daily. 90 tablet 2   [DISCONTINUED] dicyclomine (BENTYL) 20 MG tablet Take 1 tablet (20 mg total) by mouth 2 (two) times daily. (Patient not taking: No sig reported) 20 tablet 0   [DISCONTINUED] norethindrone (MICRONOR) 0.35 MG tablet Take 1 tablet (0.35 mg total) by mouth daily. (Patient not taking: No sig reported) 30 tablet 6   [DISCONTINUED] omeprazole (PRILOSEC) 40 MG capsule Take 1 capsule (40 mg total) by mouth in the morning and at bedtime. (Patient not taking: Reported on 01/04/2021) 60 capsule 2   No current facility-administered medications on file prior to visit.    ALLERGIES: Allergies  Allergen Reactions   Imitrex [Sumatriptan] Swelling    Facial swelling and burning lips   Penicillins Hives    Did it involve  swelling of the face/tongue/throat, SOB, or low BP? No Did it involve sudden or severe rash/hives, skin peeling, or any reaction on the inside of your mouth or nose? Yes Did you need to seek medical attention at a hospital or doctor's office? Yes When did it last happen?      childhood allergy If all above answers are "NO", may proceed with cephalosporin use.    FAMILY HISTORY: Family History  Problem Relation Age of Onset   Migraines Mother    Skin cancer Father    Asthma Maternal Grandmother    Hypertension Maternal Grandmother    Thyroid disease Maternal Grandmother    Hypertension Maternal Grandfather    Pancreatic cancer Maternal Grandfather 67   Colon cancer Maternal Grandfather 60   Hypertension Paternal Grandmother    Diabetes Paternal Grandmother    Hypertension Paternal Grandfather    Diabetes Paternal Grandfather    Colon cancer Paternal Grandfather 57   Thyroid disease Maternal Aunt    Breast cancer Maternal Aunt 45   Allergies Brother       Objective:  Blood pressure 109/73, pulse 99, height _0  (1.651 m), weight 170 lb 3.2 oz (77.2 kg), last menstrual period 11/01/2022, SpO2 98 %. General: No acute distress.  Patient appears well-groomed.     Metta Clines, DO  CC: Amanda Kirschner, PA-C

## 2022-11-24 ENCOUNTER — Ambulatory Visit (INDEPENDENT_AMBULATORY_CARE_PROVIDER_SITE_OTHER): Payer: Medicaid Other | Admitting: Neurology

## 2022-11-24 ENCOUNTER — Encounter: Payer: Self-pay | Admitting: Neurology

## 2022-11-24 VITALS — BP 109/73 | HR 99 | Ht 65.0 in | Wt 170.2 lb

## 2022-11-24 DIAGNOSIS — G43009 Migraine without aura, not intractable, without status migrainosus: Secondary | ICD-10-CM

## 2022-11-24 MED ORDER — ZAVZPRET 10 MG/ACT NA SOLN: 1.0000 | Freq: Every day | NASAL | 5 refills | Status: DC | PRN

## 2022-11-24 MED ORDER — TOPIRAMATE 50 MG PO TABS: 50.0000 mg | ORAL_TABLET | Freq: Every day | ORAL | 5 refills | Status: DC

## 2022-11-24 NOTE — Patient Instructions (Signed)
Increase topiramate to '50mg'$  at bedtime.  If no improvement in 4 weeks, contact me and we can increase dose Take Zavzpret nasal spray as needed Limit use of pain relievers to no more than 2 days out of week to prevent risk of rebound or medication-overuse headache. Keep headache diary Follow up 4 to 5 months.

## 2022-11-26 ENCOUNTER — Encounter: Payer: Self-pay | Admitting: Family Medicine

## 2022-11-27 NOTE — Progress Notes (Unsigned)
      Established patient visit   Patient: Amanda [name]   DOB: [birth year]   26 y.o. Female  MRN: [id number] Visit Date: 11/28/2022  Today's healthcare provider: [name], PA-C   No chief complaint on file.  Subjective    HPI  Bump: Patient presents with a Bump.  Symptoms have been present for {0-10:33138} {units:11}.  The Bump is located on the {body part general:32401}. Since then it {has not:15037} spread to the {body part:32401}. Parent {has/not:15037} tried {med tried:12996} for initial treatment showing bump {change:13112}. Discomfort is {Severity:14456}. Patient {has/not:15037} fever. Recent illnesses: {illness:12986}. Sick contacts: {sick contacts:12987}   Medications: Outpatient Medications Prior to Visit  Medication Sig   Adapalene (DIFFERIN) 0.3 % gel Apply to face qhs, wash off qam   Clindamycin-Benzoyl Per, Refr, (DUAC) gel Apply to face qam, wash off qhs   escitalopram (LEXAPRO) 10 MG tablet Take 1 tablet (10 mg total) by mouth daily. Take along with 5 mg daily - total of 15 mg daily   escitalopram (LEXAPRO) 5 MG tablet Take 1 tablet (5 mg total) by mouth daily. Take along with 10 mg daily   hydrOXYzine (VISTARIL) 25 MG capsule TAKE 1-2 CAPSULES (25-50 MG TOTAL) BY MOUTH AT BEDTIME AS NEEDED. FOR ANXIETY AND SLEEP   mupirocin ointment (BACTROBAN) 2 % Apply 1 Application topically 2 (two) times daily.   spironolactone (ALDACTONE) 50 MG tablet Take 1 tablet (50 mg total) by mouth daily.   topiramate (TOPAMAX) 50 MG tablet Take 1 tablet (50 mg total) by mouth at bedtime.   traZODone (DESYREL) 50 MG tablet TAKE 1/2 TABLET BY MOUTH AT BEDTIME AS NEEDED FOR SLEEP   triamcinolone lotion (KENALOG) 0.1 % Apply 1 Application topically 2 (two) times daily.   valACYclovir (VALTREX) 1000 MG tablet Take 1 tablet (1,000 mg total) by mouth daily.   Zavegepant HCl (ZAVZPRET) 10 MG/ACT SOLN Place 1 spray into the nose daily as needed. Maximum 1 spray in 24 hours.   No  facility-administered medications prior to visit.    Review of Systems  {Labs  Heme  Chem  Endocrine  Serology  Results Review (optional):23779}   Objective    LMP 11/01/2022  {Show previous vital signs (optional):23777}  Physical Exam  ***  No results found for any visits on 11/28/22.  Assessment & Plan     ***  No follow-ups on file.      {provider attestation***:1}   [name], PA-C  [facility] [phone] (phone) [phone] (fax)  [place]

## 2022-11-28 ENCOUNTER — Other Ambulatory Visit: Payer: Self-pay

## 2022-11-28 ENCOUNTER — Ambulatory Visit (INDEPENDENT_AMBULATORY_CARE_PROVIDER_SITE_OTHER): Payer: Medicaid Other | Admitting: Physician Assistant

## 2022-11-28 ENCOUNTER — Encounter: Payer: Self-pay | Admitting: Physician Assistant

## 2022-11-28 VITALS — BP 113/73 | HR 113 | Temp 97.7°F | Wt 169.8 lb

## 2022-11-28 DIAGNOSIS — R220 Localized swelling, mass and lump, head: Secondary | ICD-10-CM

## 2022-11-28 DIAGNOSIS — R635 Abnormal weight gain: Secondary | ICD-10-CM | POA: Diagnosis not present

## 2022-11-28 DIAGNOSIS — M542 Cervicalgia: Secondary | ICD-10-CM

## 2022-11-28 DIAGNOSIS — G8929 Other chronic pain: Secondary | ICD-10-CM

## 2022-11-28 DIAGNOSIS — M545 Low back pain, unspecified: Secondary | ICD-10-CM

## 2022-12-07 ENCOUNTER — Telehealth: Payer: Medicaid Other | Admitting: Psychiatry

## 2022-12-07 ENCOUNTER — Other Ambulatory Visit: Payer: Self-pay | Admitting: Obstetrics and Gynecology

## 2022-12-07 DIAGNOSIS — A6000 Herpesviral infection of urogenital system, unspecified: Secondary | ICD-10-CM

## 2022-12-07 NOTE — Progress Notes (Signed)
Amanda Amanda [name] [place] Phone: [phone] Subjective:   Amanda Amanda [name], am serving as a scribe for Dr. Hulan [name].  I'm seeing this patient by the request  of:  Amanda Kirschner, PA-C  CC: Neck pain and back pain  [id number]  Amanda Amanda [name] is a 26 y.o. female coming in with complaint of back and neck pain. OMT 11/08/2022. Patient states that she has been having an increase in neck pain and migraines.   Also c/o numbness in both feet intermittently in the heel and tips of her toes. Symptoms occurring with legs crossed. Notes redness in those areas as well when she feels numbness.   Also mentions that she was told that she clenches her jaw by her dentist. Amanda Amanda [name] if this is contributing to her headaches.   Medications patient has been prescribed: None  Taking:         Reviewed prior external information including notes and imaging from previsou exam, outside providers and external EMR if available.   As well as notes that were available from care everywhere and other healthcare systems.  Past medical history, social, surgical and family history all reviewed in electronic medical record.  Amanda Amanda [name] information unless stated regarding to the chief complaint.   Past Medical History:  Diagnosis Date   BRCA negative 09/2020   MyRisk neg except AXIN2 VUS   Family history of adverse reaction to anesthesia    heart condition   Family history of breast cancer 09/2020   IBIS=15.7%/riskscore=14.7%   Family history of pancreatic cancer    GERD (gastroesophageal reflux disease)    Migraine     Allergies  Allergen Reactions   Imitrex [Sumatriptan] Swelling    Facial swelling and burning lips   Penicillins Hives    Did it involve swelling of the face/tongue/throat, SOB, or low BP? Amanda [name] Did it involve sudden or severe rash/hives, skin peeling, or any reaction on the inside of your mouth or nose? Yes Did you need to  seek medical attention at a hospital or doctor's office? Yes When did it last happen?      childhood allergy If all above answers are "Amanda [name]", may proceed with cephalosporin use.     Review of Systems:  Amanda [name] headache, visual changes, nausea, vomiting, diarrhea, constipation, dizziness, abdominal pain, skin rash, fevers, chills, night sweats, weight loss, swollen lymph nodes, body aches, joint swelling, chest pain, shortness of breath, mood changes. POSITIVE muscle aches  Objective  Blood pressure 106/72, pulse 86, height _0  (1.651 m), weight 167 lb (75.8 kg).   General: Amanda [name] alert and oriented x3 mood and affect normal, dressed appropriately.  HEENT: Pupils equal, extraocular movements intact  Respiratory: Patient's speak in full sentences and does not appear short of breath  Cardiovascular: Amanda [name] lower extremity edema, non tender, Amanda [name] erythema  Gait MSK:  Back   Osteopathic findings  C2 flexed rotated and side bent right C6 flexed rotated and side bent left T3 extended rotated and side bent right inhaled rib T9 extended rotated and side bent left L2 flexed rotated and side bent right Sacrum right on right       Assessment and Plan:  Cervicogenic headache Continue cervicogenic headache, patient has been recently grinding her teeth and may even have some potential TMJ.  We discussed things to do and which ones to avoid.  Increase activity slowly over the course the next several weeks.  Follow-up with me again  in 6 to 8 weeks.  Did discuss the Zanaflex at 10 mg at night to use as needed which was prescribed today.    Nonallopathic problems  Decision today to treat with OMT was based on Physical Exam  After verbal consent patient was treated with , ME, FPR techniques in cervical, rib, thoracic, lumbar, and sacral  areas  Patient tolerated the procedure well with improvement in symptoms  Patient given exercises, stretches and lifestyle modifications  See  medications in patient instructions if given  Patient will follow up in 4-8 weeks      The above documentation has been reviewed and is accurate and complete Amanda Pulley, DO        Note: This dictation was prepared with Dragon dictation along with smaller phrase technology. Any transcriptional errors that result from this process are unintentional.

## 2022-12-08 ENCOUNTER — Encounter: Payer: Self-pay | Admitting: Family Medicine

## 2022-12-08 ENCOUNTER — Encounter: Payer: Self-pay | Admitting: Physician Assistant

## 2022-12-08 ENCOUNTER — Other Ambulatory Visit: Payer: Self-pay | Admitting: Physician Assistant

## 2022-12-08 DIAGNOSIS — A6000 Herpesviral infection of urogenital system, unspecified: Secondary | ICD-10-CM

## 2022-12-08 MED ORDER — VALACYCLOVIR HCL 1 G PO TABS: 1000.0000 mg | ORAL_TABLET | Freq: Every day | ORAL | 2 refills | Status: DC

## 2022-12-12 ENCOUNTER — Telehealth (INDEPENDENT_AMBULATORY_CARE_PROVIDER_SITE_OTHER): Payer: Medicaid Other | Admitting: Psychiatry

## 2022-12-12 ENCOUNTER — Encounter: Payer: Self-pay | Admitting: Psychiatry

## 2022-12-12 DIAGNOSIS — F411 Generalized anxiety disorder: Secondary | ICD-10-CM | POA: Diagnosis not present

## 2022-12-12 DIAGNOSIS — G4701 Insomnia due to medical condition: Secondary | ICD-10-CM | POA: Diagnosis not present

## 2022-12-12 DIAGNOSIS — F3342 Major depressive disorder, recurrent, in full remission: Secondary | ICD-10-CM

## 2022-12-12 NOTE — Progress Notes (Unsigned)
Virtual Visit via Video Note  I connected with Amanda [name] on 12/12/22 at  1:00 PM EST by a video enabled telemedicine application and verified that I am speaking with the correct person using two identifiers.  Location Provider Location : [facility] Patient Location : Home  Participants: Patient , Provider   I discussed the limitations of evaluation and management by telemedicine and the availability of in person appointments. The patient expressed understanding and agreed to proceed.   I discussed the assessment and treatment plan with the patient. The patient was provided an opportunity to ask questions and all were answered. The patient agreed with the plan and demonstrated an understanding of the instructions.   The patient was advised to call back or seek an in-person evaluation if the symptoms worsen or if the condition fails to improve as anticipated.   [facility] MD OP Progress Note  12/13/2022 8:17 AM Amanda [name]  MRN:  [id number]  Chief Complaint:  Chief Complaint  Patient presents with   Follow-up   Medication Refill   Anxiety   Depression   HPI: Amanda [name] is a 26 year old female, engaged, lives in [place], self-employed, with history of GAD, MDD, was evaluated by telemedicine today.  Patient today reports she had a good Christmas holiday with her family.  Patient reports overall mood symptoms as improved.  Higher dosage of Lexapro has been beneficial with her anxiety.  Denies any depression symptoms.  Patient reports sleep is restless at times due to her children needing her help at night.  Otherwise she has been sleeping okay.  Patient appeared to be alert, oriented to person place time situation.  Denies suicidality, homicidality or perceptual disturbances.  Patient is compliant on the Lexapro, denies side effects.  Patient continues to follow-up with her therapist, reports her therapist recommended work sheets as well as books to read to cope with her anxiety.   She is trying to come up with a routine to do all that.  Denies any other concerns today.  Visit Diagnosis:    ICD-10-CM   1. GAD (generalized anxiety disorder)  F41.1     2. MDD (major depressive disorder), recurrent, in full remission ([place])  F33.42     3. Insomnia due to medical condition  G47.01    anxiety, children needing her help at night      Past Psychiatric History: Reviewed past psychiatric history from progress note on 02/02/2022.  Past trials of Zoloft-did not work, Effexor-made her suicidal.  Past Medical History:  Past Medical History:  Diagnosis Date   BRCA negative 09/2020   MyRisk neg except AXIN2 VUS   Family history of adverse reaction to anesthesia    heart condition   Family history of breast cancer 09/2020   IBIS=15.7%/riskscore=14.7%   Family history of pancreatic cancer    GERD (gastroesophageal reflux disease)    Migraine     Past Surgical History:  Procedure Laterality Date   NO PAST SURGERIES     TONSILLECTOMY Bilateral 09/05/2022   Procedure: TONSILLECTOMY;  Surgeon: Clyde Canterbury, MD;  Location: [facility];  Service: ENT;  Laterality: Bilateral;   WISDOM TOOTH EXTRACTION      Family Psychiatric History: Reviewed family psychiatric history from progress note on 02/02/2022.  Family History:  Family History  Problem Relation Age of Onset   Migraines Mother    Skin cancer Father    Asthma Maternal Grandmother    Hypertension Maternal Grandmother    Thyroid disease Maternal Grandmother  Hypertension Maternal Grandfather    Pancreatic cancer Maternal Grandfather 1   Colon cancer Maternal Grandfather 60   Hypertension Paternal Grandmother    Diabetes Paternal Grandmother    Hypertension Paternal Grandfather    Diabetes Paternal Grandfather    Colon cancer Paternal Grandfather 60   Thyroid disease Maternal Aunt    Breast cancer Maternal Aunt 10   Allergies Brother     Social History: Reviewed social history from progress note  on 02/02/2022. Social History   Socioeconomic History   Marital status: Single    Spouse name: [name]   Number of children: 2   Years of education: Not on file   Highest education level: Associate degree: occupational, Hotel manager, or vocational program  Occupational History   Occupation: Massage Therapy  Tobacco Use   Smoking status: Never   Smokeless tobacco: Never  Vaping Use   Vaping Use: Never used  Substance and Sexual Activity   Alcohol use: Never   Drug use: No   Sexual activity: Yes    Partners: Male    Birth control/protection: None  Other Topics Concern   Not on file  Social History Narrative   ** Merged History Encounter **       One story home Right-handed Caffeine: occasional coffee   Social Determinants of Health   Financial Resource Strain: Not on file  Food Insecurity: Not on file  Transportation Needs: Not on file  Physical Activity: Not on file  Stress: Stress Concern Present (12/01/2021)   [employer] [place] of Stress : Rather much  Social Connections: Not on file    Allergies:  Allergies  Allergen Reactions   Imitrex [Sumatriptan] Swelling    Facial swelling and burning lips   Penicillins Hives    Did it involve swelling of the face/tongue/throat, SOB, or low BP? No Did it involve sudden or severe rash/hives, skin peeling, or any reaction on the inside of your mouth or nose? Yes Did you need to seek medical attention at a hospital or doctor's office? Yes When did it last happen?      childhood allergy If all above answers are "NO", may proceed with cephalosporin use.    Metabolic Disorder Labs: Lab Results  Component Value Date   HGBA1C 5.3 01/07/2015   No results found for: "PROLACTIN" Lab Results  Component Value Date   CHOL 135 06/28/2020   TRIG 54 06/28/2020   HDL 54 06/28/2020   LDLCALC 69 06/28/2020   Lab Results  Component Value Date   TSH 1.78  07/04/2022   TSH 1.600 03/08/2022    Therapeutic Level Labs: No results found for: "LITHIUM" No results found for: "VALPROATE" No results found for: "CBMZ"  Current Medications: Current Outpatient Medications  Medication Sig Dispense Refill   escitalopram (LEXAPRO) 10 MG tablet Take 1 tablet (10 mg total) by mouth daily. Take along with 5 mg daily - total of 15 mg daily 90 tablet 0   escitalopram (LEXAPRO) 5 MG tablet Take 1 tablet (5 mg total) by mouth daily. Take along with 10 mg daily 90 tablet 0   hydrOXYzine (VISTARIL) 25 MG capsule TAKE 1-2 CAPSULES (25-50 MG TOTAL) BY MOUTH AT BEDTIME AS NEEDED. FOR ANXIETY AND SLEEP 180 capsule 0   spironolactone (ALDACTONE) 50 MG tablet Take 1 tablet (50 mg total) by mouth daily. 30 tablet 3   topiramate (TOPAMAX) 50 MG tablet Take 1 tablet (50 mg total) by mouth  at bedtime. 30 tablet 5   traZODone (DESYREL) 50 MG tablet TAKE 1/2 TABLET BY MOUTH AT BEDTIME AS NEEDED FOR SLEEP 45 tablet 0   triamcinolone lotion (KENALOG) 0.1 % Apply 1 Application topically 2 (two) times daily. 60 mL 0   valACYclovir (VALTREX) 1000 MG tablet Take 1 tablet (1,000 mg total) by mouth daily. 90 tablet 2   Zavegepant HCl (ZAVZPRET) 10 MG/ACT SOLN Place 1 spray into the nose daily as needed. Maximum 1 spray in 24 hours. 8 each 5   Adapalene (DIFFERIN) 0.3 % gel Apply to face qhs, wash off qam 45 g 2   Clindamycin-Benzoyl Per, Refr, (DUAC) gel Apply to face qam, wash off qhs 45 g 2   mupirocin ointment (BACTROBAN) 2 % Apply 1 Application topically 2 (two) times daily. (Patient not taking: Reported on 12/12/2022) 15 g 0   No current facility-administered medications for this visit.     Musculoskeletal: Strength & Muscle Tone:  UTA Gait & Station:  Seated Patient leans: N/A  Psychiatric Specialty Exam: Review of Systems  Psychiatric/Behavioral:  The patient is nervous/anxious.   All other systems reviewed and are negative.   There were no vitals taken for this  visit.There is no height or weight on file to calculate BMI.  General Appearance: Casual  Eye Contact:  Fair  Speech:  Clear and Coherent  Volume:  Normal  Mood:  Anxious improving  Affect:  Full Range  Thought Process:  Goal Directed and Descriptions of Associations: Intact  Orientation:  Full (Time, Place, and Person)  Thought Content: Logical   Suicidal Thoughts:  No  Homicidal Thoughts:  No  Memory:  Immediate;   Fair Recent;   Fair Remote;   Fair  Judgement:  Fair  Insight:  Fair  Psychomotor Activity:  Normal  Concentration:  Concentration: Fair and Attention Span: Fair  Recall:  [employer] of Knowledge: Fair  Language: Fair  Akathisia:  No  Handed:  Right  AIMS (if indicated): not done  Assets:  Communication Skills Desire for Improvement Housing Intimacy Social Support  ADL's:  Intact  Cognition: WNL  Sleep:  Fair   Screenings: AIMS    Flowsheet Row Video Visit from 07/21/2022 in [facility] Total Score 0      GAD-7    Flowsheet Row Video Visit from 12/12/2022 in [facility] Video Visit from 08/29/2022 in [facility] Video Visit from 07/21/2022 in [facility] Video Visit from 06/06/2022 in [facility] Office Visit from 06/01/2022 in [facility]  Total GAD-7 Score _0 PHQ2-9    Flowsheet Row Video Visit from 12/12/2022 in [facility] Counselor from 11/21/2022 in [facility] Video Visit from 08/29/2022 in [facility] Video Visit from 07/21/2022 in [facility] Video Visit from 06/06/2022 in [facility]  PHQ-2 Total Score 0 0 0 1 0      Flowsheet Row Video Visit from 12/12/2022 in [facility] Counselor from  11/21/2022 in [facility] Admission (Discharged) from 09/05/2022 in [facility] No Risk No Risk No Risk        Assessment and Plan: Amanda [name] is a 26 year old female, engaged, lives in [place], has a history of GAD, MDD was evaluated by telemedicine today.  Patient is currently improving.  Plan as noted below.  Plan GAD-improving Lexapro 15 mg p.o. daily Continue CBT with Ms. [name] Hussami Hydroxyzine 25-50 mg at bedtime as needed  MDD in remission Lexapro 15 mg p.o. daily  Insomnia-restless due to children needing her help at night Patient to work on sleep hygiene techniques Trazodone 25 mg at bedtime as needed for sleep  Follow-up in clinic in 3 months or sooner if needed.  Collaboration of Care: Collaboration of Care: Referral or follow-up with counselor/therapist AEB encouraged to continue to follow-up with therapist.  Patient/Guardian was advised Release of Information must be obtained prior to any record release in order to collaborate their care with an outside provider. Patient/Guardian was advised if they have not already done so to contact the registration department to sign all necessary forms in order for [place] to release information regarding their care.   Consent: Patient/Guardian gives verbal consent for treatment and assignment of benefits for services provided during this visit. Patient/Guardian expressed understanding and agreed to proceed.   This note was generated in part or whole with voice recognition software. Voice recognition is usually quite accurate but there are transcription errors that can and very often do occur. I apologize for any typographical errors that were not detected and corrected.      Ursula Alert, MD 12/13/2022, 8:17 AM

## 2022-12-13 ENCOUNTER — Other Ambulatory Visit: Payer: Medicaid Other

## 2022-12-13 ENCOUNTER — Other Ambulatory Visit: Payer: Self-pay | Admitting: Family Medicine

## 2022-12-13 NOTE — Progress Notes (Unsigned)
Patient continues to have headache and neck pain. Patient states that the headaches are worsening at this point.  Patient has done multiple rounds of formal physical therapy, multiple medications at the moment as well.  Patient has seen neurology as well.  Fullness in the neck and has had ultrasound of the thyroid that showed mild enlargement but no significant nodularity.  I do feel at this point that an MRI brain without and MRI cervical without contrast would be beneficial.  Will make sure that patient does not have a Chiari malformation, or any other significant findings that would contribute to patient's symptomatology.

## 2022-12-15 ENCOUNTER — Encounter: Payer: Self-pay | Admitting: Family Medicine

## 2022-12-15 ENCOUNTER — Ambulatory Visit (INDEPENDENT_AMBULATORY_CARE_PROVIDER_SITE_OTHER): Payer: Medicaid Other | Admitting: Family Medicine

## 2022-12-15 VITALS — BP 106/72 | HR 86 | Ht 65.0 in | Wt 167.0 lb

## 2022-12-15 DIAGNOSIS — M9904 Segmental and somatic dysfunction of sacral region: Secondary | ICD-10-CM | POA: Diagnosis not present

## 2022-12-15 DIAGNOSIS — M9903 Segmental and somatic dysfunction of lumbar region: Secondary | ICD-10-CM | POA: Diagnosis not present

## 2022-12-15 DIAGNOSIS — M9901 Segmental and somatic dysfunction of cervical region: Secondary | ICD-10-CM

## 2022-12-15 DIAGNOSIS — G4486 Cervicogenic headache: Secondary | ICD-10-CM | POA: Diagnosis not present

## 2022-12-15 DIAGNOSIS — M9902 Segmental and somatic dysfunction of thoracic region: Secondary | ICD-10-CM | POA: Diagnosis not present

## 2022-12-15 DIAGNOSIS — M9908 Segmental and somatic dysfunction of rib cage: Secondary | ICD-10-CM

## 2022-12-15 DIAGNOSIS — I7300 Raynaud's syndrome without gangrene: Secondary | ICD-10-CM

## 2022-12-15 MED ORDER — TIZANIDINE HCL 2 MG PO TABS: 2.0000 mg | ORAL_TABLET | Freq: Every day | ORAL | 0 refills | Status: DC
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
## 2022-12-15 NOTE — Assessment & Plan Note (Signed)
Continue cervicogenic headache, patient has been recently grinding her teeth and may even have some potential TMJ.  We discussed things to do and which ones to avoid.  Increase activity slowly over the course the next several weeks.  Follow-up with me again in 6 to 8 weeks.  Did discuss the Zanaflex at 10 mg at night to use as needed which was prescribed today.

## 2022-12-15 NOTE — Patient Instructions (Signed)
Zanaflex 2 mg at night as need Low back HEP 6-8 week follow up

## 2022-12-18 ENCOUNTER — Encounter: Payer: Self-pay | Admitting: Physician Assistant

## 2022-12-21 ENCOUNTER — Encounter: Payer: Self-pay | Admitting: Physician Assistant

## 2022-12-21 ENCOUNTER — Ambulatory Visit (INDEPENDENT_AMBULATORY_CARE_PROVIDER_SITE_OTHER): Payer: Medicaid Other | Admitting: Physician Assistant

## 2022-12-21 VITALS — BP 103/76 | HR 110 | Wt 168.7 lb

## 2022-12-21 DIAGNOSIS — R635 Abnormal weight gain: Secondary | ICD-10-CM | POA: Diagnosis not present

## 2022-12-21 DIAGNOSIS — R202 Paresthesia of skin: Secondary | ICD-10-CM

## 2022-12-21 DIAGNOSIS — Z23 Encounter for immunization: Secondary | ICD-10-CM | POA: Diagnosis not present

## 2022-12-21 NOTE — Telephone Encounter (Signed)
Saw pt in office today

## 2022-12-21 NOTE — Progress Notes (Signed)
I,[name],acting as a Education administrator for [employer], PA-C.,have documented all relevant documentation on the behalf of Amanda Kirschner, PA-C,as directed by  Amanda Kirschner, PA-C while in the presence of Amanda Kirschner, PA-C.   Established patient visit   Patient: Amanda [name]   DOB: 06-29-1996   27 y.o. Female  MRN: [id number] Visit Date: 12/21/2022  Today's healthcare provider: [name], PA-C   Cc. Numbness in left heel x 1 month  Subjective    HPI  Patient is being seen for numbness and tingling with rednessof left heel x 1 month. Has happened sometimes in her right foot as well. The sensation can take an hour before it goes away. She has noticed it happens more when she is sitting crossed legged or driving.  She also as additional questions about weight management. She is concerned over weight gain and would like to know of any medical options.  Medications: Outpatient Medications Prior to Visit  Medication Sig   Adapalene (DIFFERIN) 0.3 % gel Apply to face qhs, wash off qam   Clindamycin-Benzoyl Per, Refr, (DUAC) gel Apply to face qam, wash off qhs   escitalopram (LEXAPRO) 10 MG tablet Take 1 tablet (10 mg total) by mouth daily. Take along with 5 mg daily - total of 15 mg daily   escitalopram (LEXAPRO) 5 MG tablet Take 1 tablet (5 mg total) by mouth daily. Take along with 10 mg daily   hydrOXYzine (VISTARIL) 25 MG capsule TAKE 1-2 CAPSULES (25-50 MG TOTAL) BY MOUTH AT BEDTIME AS NEEDED. FOR ANXIETY AND SLEEP   mupirocin ointment (BACTROBAN) 2 % Apply 1 Application topically 2 (two) times daily.   tiZANidine (ZANAFLEX) 2 MG tablet Take 1 tablet (2 mg total) by mouth at bedtime.   topiramate (TOPAMAX) 50 MG tablet Take 1 tablet (50 mg total) by mouth at bedtime.   traZODone (DESYREL) 50 MG tablet TAKE 1/2 TABLET BY MOUTH AT BEDTIME AS NEEDED FOR SLEEP   triamcinolone lotion (KENALOG) 0.1 % Apply 1 Application topically 2 (two) times daily.   valACYclovir (VALTREX)  1000 MG tablet Take 1 tablet (1,000 mg total) by mouth daily.   Zavegepant HCl (ZAVZPRET) 10 MG/ACT SOLN Place 1 spray into the nose daily as needed. Maximum 1 spray in 24 hours.   spironolactone (ALDACTONE) 50 MG tablet Take 1 tablet (50 mg total) by mouth daily. (Patient not taking: Reported on 12/21/2022)   No facility-administered medications prior to visit.    Review of Systems  Constitutional:  Negative for fatigue and fever.  Respiratory:  Negative for cough and shortness of breath.   Cardiovascular:  Negative for chest pain and leg swelling.  Gastrointestinal:  Negative for abdominal pain.  Neurological:  Negative for dizziness and headaches.      Objective    Blood pressure 103/76, pulse (!) 110, weight 168 lb 11.2 oz (76.5 kg), SpO2 100 %.   Physical Exam Musculoskeletal:     Left foot: Normal. Normal capillary refill. No swelling or tenderness.  Feet:     Left foot:     Skin integrity: Skin integrity normal.      No results found for any visits on 12/21/22.  Assessment & Plan     Left foot paresthesias Advised pt to monitor to see if there is a pattern to when it occurs Advised not to sit on her foot crossed legged  Sounds like local blood flow being cut off d/t position  2. Weight gain She is not eligible  for any medications for assistance Again discussed diet and exercise.  Problem List Items Addressed This Visit   None Visit Diagnoses     Paresthesia of left foot    -  Primary   Weight gain       Need for immunization against influenza       Relevant Orders   Flu Vaccine QUAD 97moIM (Fluarix, Fluzone & Alfiuria Quad PF) (Completed)       Return if symptoms worsen or fail to improve.      I, LMikey Kirschner PA-C have reviewed all documentation for this visit. The documentation on  12/21/2022  for the exam, diagnosis, procedures, and orders are all accurate and complete.  LMikey Kirschner PA-C [id number] [place] [id number]: [phone]: [phone]

## 2022-12-22 ENCOUNTER — Encounter: Payer: Self-pay | Admitting: Physician Assistant

## 2023-01-02 ENCOUNTER — Encounter: Payer: Self-pay | Admitting: Family Medicine

## 2023-01-02 ENCOUNTER — Ambulatory Visit
Admission: RE | Admit: 2023-01-02 | Discharge: 2023-01-02 | Disposition: A | Discharge status: Home / Self Care | Payer: Medicaid Other | Source: Ambulatory Visit | Attending: Family Medicine | Admitting: Family Medicine

## 2023-01-02 DIAGNOSIS — R519 Headache, unspecified: Secondary | ICD-10-CM

## 2023-01-02 DIAGNOSIS — M542 Cervicalgia: Secondary | ICD-10-CM

## 2023-01-03 ENCOUNTER — Ambulatory Visit (HOSPITAL_COMMUNITY): Payer: Medicaid Other | Admitting: Licensed Clinical Social Worker

## 2023-01-03 ENCOUNTER — Encounter: Payer: Self-pay | Admitting: Family Medicine

## 2023-01-06 ENCOUNTER — Other Ambulatory Visit: Payer: Self-pay | Admitting: Family Medicine

## 2023-01-09 NOTE — Progress Notes (Signed)
Office Visit Note  Patient: Amanda [name]             Date of Birth: January 15, 1996           MRN: [id number]             PCP: [name], PA-C Referring: [name], DO Visit Date: 01/23/2023 Occupation: '@GUAROCC'$ @  Subjective:   Positive ANA  History of Present Illness: Amanda [name] is a 27 y.o. female in consultation per [place] of Dr. Tamala Julian.  According to the patient she has been experiencing tingling in her bilateral index and middle finger for the last needed.  She also has been experiencing numbness and reddish discoloration in her heels and her toes.  She states that she stays cold all the time.  Even when she wraps herself and proper warm clothing she still notices the same symptoms.  She has been experiencing mid and lower back pain for which she has been seeing Dr. Tamala Julian.  She had x-rays of her lumbar spine in November 2023 which were unremarkable.  She has been also experiencing ongoing cervical spine pain for the last 2 years for which she has been seeing Dr. Tamala Julian.  She had x-rays of the cervical spine followed by MRI of the cervical spine which showed mild spondylosis.  She gets manipulations and injections by Dr. Tamala Julian.  She denies any radiculopathy.  There is no history of oral ulcers, nasal ulcers, malar rash, photosensitivity or lymphadenopathy.  She gives history of dry mouth and dry eyes.  She has family members who have hypothyroidism and hyperthyroidism.  There is no other autoimmune disease in the family.  Gravida 1, para 2.  She has 15-years-old twins.  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  She is asymptomatic when she is working out.    Activities of Daily Living:  Patient reports morning stiffness for Constant in neck .   Patient Reports nocturnal pain.  Difficulty dressing/grooming: Denies Difficulty climbing stairs: Denies Difficulty getting out of chair: Denies Difficulty using hands for taps, buttons, cutlery, and/or  writing: Denies  Review of Systems  Constitutional: Negative.  Negative for fatigue.  HENT:  Positive for mouth dryness. Negative for mouth sores.   Eyes:  Positive for dryness.  Respiratory: Negative.  Negative for shortness of breath.   Cardiovascular: Negative.  Negative for chest pain and palpitations.  Gastrointestinal: Negative.  Negative for blood in stool, constipation and diarrhea.  Endocrine: Negative.  Negative for increased urination.  Genitourinary: Negative.  Negative for involuntary urination.  Musculoskeletal:  Positive for myalgias, morning stiffness and myalgias. Negative for joint pain, gait problem, joint pain, joint swelling, muscle weakness and muscle tenderness.       Neck stiffness  Skin:  Negative for color change, rash, hair loss and sensitivity to sunlight.  Allergic/Immunologic: Negative.  Negative for susceptible to infections.  Neurological:  Positive for headaches. Negative for dizziness.  Hematological: Negative.  Negative for swollen glands.  Psychiatric/Behavioral:  Positive for depressed mood and sleep disturbance. The patient is nervous/anxious.     PMFS History:  Patient Active Problem List   Diagnosis Date Noted   Raynaud's syndrome 12/15/2022   Insomnia 07/21/2022   Neck pain 07/10/2022   ANA positive 07/10/2022   Hot flashes 07/10/2022   Sinusitis 07/04/2022   Thyromegaly 07/04/2022   Adenoid vegetations 06/21/2022   MDD (major depressive disorder), recurrent, in full remission ([place]) 06/06/2022   Abscess of groin, right  06/05/2022   Inguinal lymphadenopathy 03/08/2022   MDD (major depressive disorder), recurrent, in partial remission ([place]) 03/06/2022   MDD (major depressive disorder), recurrent episode, mild ([place]) 02/02/2022   Intractable migraine with aura with status migrainosus 01/31/2022   Depression, major, single episode, severe ([facility]) 01/31/2022   Cervicogenic headache 12/15/2021   Somatic dysfunction of cervical region 12/15/2021    Neck muscle spasm 11/03/2021   GAD (generalized anxiety disorder) 11/03/2021   History of affective disorder 11/03/2021   CIN I (cervical intraepithelial neoplasia I) 02/14/2021   Genital herpes simplex type 2 02/10/2021   Vitamin D deficiency 06/28/2020   History of chronic sinusitis 06/28/2020   Monochorionic diamniotic twin gestation 05/20/2019   Intractable chronic migraine without aura with status migrainosus 02/11/2017   Vision loss of left eye 01/17/2017   Acne 11/18/2014    Past Medical History:  Diagnosis Date   BRCA negative 09/2020   MyRisk neg except AXIN2 VUS   Family history of adverse reaction to anesthesia    heart condition   Family history of breast cancer 09/2020   IBIS=15.7%/riskscore=14.7%   Family history of pancreatic cancer    Family history of thyroid disease    GERD (gastroesophageal reflux disease)    Migraine     Family History  Problem Relation Age of Onset   Migraines Mother    Skin cancer Father    Asthma Maternal Grandmother    Hypertension Maternal Grandmother    Thyroid disease Maternal Grandmother    Hypertension Maternal Grandfather    Pancreatic cancer Maternal Grandfather 36   Colon cancer Maternal Grandfather 60   Hypertension Paternal Grandmother    Diabetes Paternal Grandmother    Hypertension Paternal Grandfather    Diabetes Paternal Grandfather    Colon cancer Paternal Grandfather 61   Thyroid disease Maternal Aunt    Breast cancer Maternal Aunt 4   Allergies Brother    Past Surgical History:  Procedure Laterality Date   NO PAST SURGERIES     TONSILLECTOMY Bilateral 09/05/2022   Procedure: TONSILLECTOMY;  Surgeon: Clyde Canterbury, MD;  Location: [facility];  Service: ENT;  Laterality: Bilateral;   WISDOM TOOTH EXTRACTION     Social History   Social History Narrative   ** Merged History Encounter **       One story home Right-handed Caffeine: occasional coffee   Immunization History  Administered Date(s)  Administered   DTaP 04/15/1998, 06/10/1998, 08/12/1998, 02/17/1999, 05/15/2003   HIB (PRP-OMP) 04/15/1998, 06/10/1998, 07/21/1999   HPV Quadrivalent 09/23/2009, 11/24/2009, 09/27/2010   Hepatitis A 08/20/2007, 08/28/2008   Hepatitis B 04/15/1998, 06/10/1998, 07/21/1999   IPV 04/15/1998, 06/10/1998, 02/17/1999, 05/15/2003   Influenza Split 12/31/2005, 10/27/2008, 09/06/2012   Influenza Whole 09/30/2009   Influenza,inj,Quad PF,6+ Mos 09/11/2013, 09/04/2014, 08/20/2015, 08/29/2016, 10/04/2017, 08/20/2018, 08/13/2019, 11/03/2021, 12/21/2022   Influenza-Unspecified 09/06/2012   MMR 07/21/1999, 05/15/2003   Meningococcal Conjugate 08/20/2015   Meningococcal polysaccharide vaccine (MPSV4) 09/23/2009   PPD Test 03/13/2016, 03/20/2016   Td 08/28/2008   Tdap 08/28/2008, 08/22/2018, 10/21/2019   Varicella 02/17/1999, 08/20/2007     Objective: Vital Signs: BP 101/67 (BP Location: Right Arm, Patient Position: Sitting, Cuff Size: Normal)   Pulse 87   Ht 5' 4.57" (1.64 m)   Wt 167 lb (75.8 kg)   BMI 28.16 kg/m    Physical Exam Vitals and nursing note reviewed.  Constitutional:      Appearance: She is well-developed.  HENT:     Head: Normocephalic and atraumatic.  Eyes:  Conjunctiva/sclera: Conjunctivae normal.  Cardiovascular:     Rate and Rhythm: Normal rate and regular rhythm.     Heart sounds: Normal heart sounds.  Pulmonary:     Effort: Pulmonary effort is normal.     Breath sounds: Normal breath sounds.  Abdominal:     General: Bowel sounds are normal.     Palpations: Abdomen is soft.  Musculoskeletal:     Cervical back: Normal range of motion.  Lymphadenopathy:     Cervical: No cervical adenopathy.  Skin:    General: Skin is warm and dry.     Capillary Refill: Capillary refill takes less than 2 seconds.  Neurological:     Mental Status: She is alert and oriented to person, place, and time.  Psychiatric:        Behavior: Behavior normal.      Musculoskeletal Exam:  Cervical, thoracic and lumbar spine were in good range of motion.  Shoulder joints, elbow joints, wrist joints, MCPs PIPs and DIPs been good range of motion with no synovitis.  Hip joints, knee joints, ankles, MTPs and PIPs been good range of motion with no synovitis.  CDAI Exam: CDAI Score: -- Patient Global: --; Provider Global: -- Swollen: --; Tender: -- Joint Exam 01/23/2023   No joint exam has been documented for this visit   There is currently no information documented on the homunculus. Go to the Rheumatology activity and complete the homunculus joint exam.  Investigation: No additional findings.  Imaging: MR BRAIN WO CONTRAST  Result Date: 01/02/2023 CLINICAL DATA:  Provided history: Chronic non-intractable headache, unspecified headache type. Neuro deficit. Additional history provided by the scanning technologist: the patient reports a three-year history of migraines. EXAM: MRI HEAD WITHOUT CONTRAST TECHNIQUE: Multiplanar, multiecho pulse sequences of the brain and surrounding structures were obtained without intravenous contrast. COMPARISON:  Same day cervical spine MRI 01/02/2023. Brain MRI 01/29/2017. FINDINGS: Brain: Cerebral volume is normal. No cortical encephalomalacia is identified. No significant cerebral white matter disease. There is no acute infarct. No evidence of an intracranial mass. No chronic intracranial blood products. No extra-axial fluid collection. No midline shift. Vascular: Maintained flow voids within the proximal large arterial vessels. Skull and upper cervical spine: No focal suspicious marrow lesion. Sinuses/Orbits: No mass or acute finding within the imaged orbits. No significant paranasal sinus disease. IMPRESSION: Unremarkable non-contrast MRI appearance of the brain. No evidence of acute intracranial abnormality. Electronically Signed   By: Kellie Simmering D.O.   On: 01/02/2023 18:46   MR CERVICAL SPINE WO CONTRAST  Result Date: 01/02/2023 CLINICAL DATA:   Chronic neck pain and right arm tingling for the past 3 years. No injury or prior surgery. EXAM: MRI CERVICAL SPINE WITHOUT CONTRAST TECHNIQUE: Multiplanar, multisequence MR imaging of the cervical spine was performed. No intravenous contrast was administered. COMPARISON:  Cervical spine x-rays dated November 08, 2022. FINDINGS: Alignment: Straightening of the normal cervical lordosis. No listhesis. Vertebrae: No fracture, evidence of discitis, or bone lesion. Cord: Normal signal and morphology. Posterior Fossa, vertebral arteries, paraspinal tissues: Negative. Disc levels: C2-C3:  Negative. C3-C4:  Tiny central disc protrusion.  No stenosis. C4-C5:  Tiny central disc protrusion.  No stenosis. C5-C6:  Tiny central disc protrusion.  No stenosis. C6-C7:  Small central disc protrusion.  No stenosis. C7-T1:  Negative. IMPRESSION: 1. Very mild multilevel cervical spondylosis as described above. No stenosis or impingement. Electronically Signed   By: Titus Dubin M.D.   On: 01/02/2023 16:07    Recent Labs: Lab Results  Component Value Date   WBC 8.1 07/04/2022   HGB 12.9 07/04/2022   PLT 237.0 07/04/2022   NA 136 07/04/2022   K 3.7 07/04/2022   CL 104 07/04/2022   CO2 27 07/04/2022   GLUCOSE 79 07/04/2022   BUN 13 07/04/2022   CREATININE 0.74 07/04/2022   BILITOT 0.6 07/04/2022   ALKPHOS 54 07/04/2022   AST 15 07/04/2022   ALT 5 07/04/2022   PROT 7.4 07/04/2022   ALBUMIN 4.5 07/04/2022   CALCIUM 9.5 07/04/2022   GFRAA 131 06/28/2020    Speciality Comments: No specialty comments available.  Procedures:  No procedures performed Allergies: Doxycycline, Imitrex [sumatriptan], and Penicillins   Assessment / Plan:     Visit Diagnoses: Positive ANA (antinuclear antibody) -07/04/22: ANA 1:640 nuclear, dense fine speckled, ESR 8 -patient had recent labs done by Dr. Tamala Julian which showed positive ANA.  Labs were not obtained because of the history of Raynaud's phenomenon.  Patient gives history of  tingling on her fingertips mostly distal to the middle phalanx.  She does not give typical history of of ideation bluish discoloration of her hands.  She states her hands get red sometimes.  She states the symptoms happen even when her body temperature is warm.  She has not noticed any digital ulcers.  There is no history of oral ulcers, nasal ulcers, malar rash, photosensitivity, lymphadenopathy.  She gives history of dry mouth and dry eyes.  Plan: ANA, Anti-scleroderma antibody, RNP Antibody, Anti-Smith antibody, Sjogrens syndrome-A extractable nuclear antibody, Sjogrens syndrome-B extractable nuclear antibody, Anti-DNA antibody, double-stranded, C3 and C4, Beta-2 glycoprotein antibodies, Cardiolipin antibodies, IgG, IgM, IgA, Thyroglobulin antibody, Thyroid peroxidase antibody, Protein / creatinine ratio, urine  Raynaud's disease without gangrene-she gives history of cold hands and feet.  There is no history of digital ulcers.  She had good capillary refill.  No nailbed capillary changes or sclerodactyly was noted.  No telangiectasias were noted.  Information regarding raynaud's phenominon was placed in the AVS.  Keeping core temperature warm and warm clothing was discussed.  Somatic dysfunction of cervical region-she gives history of chronic discomfort in her cervical spine for at least 2 years.  She has been followed by Dr. Tamala Julian.  Recent MRI was reviewed which showed mild spondylosis.  Chronic midline low back pain without sciatica-she gives history of lower back pain off and on for the last few years.  X-ray of the lumbar spine was unremarkable.  She is followed by Dr. Tamala Julian.  Thyromegaly-TSH was normal recently.  I will check thyroglobulin and thyroperoxidase antibodies.  Anxiety and depression-she is on Lexapro.  Vitamin D deficiency-her vitamin D was little low in the past.  Intractable chronic migraine without aura with status migrainosus  Genital herpes simplex type 2  CIN I (cervical  intraepithelial neoplasia I)  Orders: Orders Placed This Encounter  Procedures   ANA   Anti-scleroderma antibody   RNP Antibody   Anti-Smith antibody   Sjogrens syndrome-A extractable nuclear antibody   Sjogrens syndrome-B extractable nuclear antibody   Anti-DNA antibody, double-stranded   C3 and C4   Beta-2 glycoprotein antibodies   Cardiolipin antibodies, IgG, IgM, IgA   Thyroglobulin antibody   Thyroid peroxidase antibody   Protein / creatinine ratio, urine   No orders of the defined types were placed in this encounter.    Follow-Up Instructions: Return for +ANA .   Bo Merino, MD  Note - This record has been created using Editor, commissioning.  Chart creation errors have been sought, but may  not always  have been located. Such creation errors do not reflect on  the standard of medical care.  

## 2023-01-15 ENCOUNTER — Encounter: Payer: Self-pay | Admitting: Physician Assistant

## 2023-01-16 ENCOUNTER — Telehealth: Payer: Self-pay | Admitting: Psychiatry

## 2023-01-16 NOTE — Telephone Encounter (Signed)
Received message from patient that she is going to be out of Lexapro however per review of medical records-both Lexapro 10 and 5 mg were sent to pharmacy on 11/20/2022 for 90 days supply.  I have already send a MyChart message to patient regarding this.

## 2023-01-22 ENCOUNTER — Encounter: Payer: Self-pay | Admitting: Dermatology

## 2023-01-23 ENCOUNTER — Encounter: Payer: Self-pay | Admitting: Rheumatology

## 2023-01-23 ENCOUNTER — Ambulatory Visit (HOSPITAL_COMMUNITY): Payer: Medicaid Other | Admitting: Licensed Clinical Social Worker

## 2023-01-23 ENCOUNTER — Ambulatory Visit: Payer: Medicaid Other | Attending: Rheumatology | Admitting: Rheumatology

## 2023-01-23 VITALS — BP 101/67 | HR 87 | Ht 64.57 in | Wt 167.0 lb

## 2023-01-23 DIAGNOSIS — M545 Low back pain, unspecified: Secondary | ICD-10-CM

## 2023-01-23 DIAGNOSIS — M9901 Segmental and somatic dysfunction of cervical region: Secondary | ICD-10-CM

## 2023-01-23 DIAGNOSIS — A6000 Herpesviral infection of urogenital system, unspecified: Secondary | ICD-10-CM

## 2023-01-23 DIAGNOSIS — R768 Other specified abnormal immunological findings in serum: Secondary | ICD-10-CM | POA: Diagnosis not present

## 2023-01-23 DIAGNOSIS — E010 Iodine-deficiency related diffuse (endemic) goiter: Secondary | ICD-10-CM

## 2023-01-23 DIAGNOSIS — J358 Other chronic diseases of tonsils and adenoids: Secondary | ICD-10-CM

## 2023-01-23 DIAGNOSIS — G8929 Other chronic pain: Secondary | ICD-10-CM

## 2023-01-23 DIAGNOSIS — I7300 Raynaud's syndrome without gangrene: Secondary | ICD-10-CM | POA: Diagnosis not present

## 2023-01-23 DIAGNOSIS — F32A Depression, unspecified: Secondary | ICD-10-CM

## 2023-01-23 DIAGNOSIS — R590 Localized enlarged lymph nodes: Secondary | ICD-10-CM

## 2023-01-23 DIAGNOSIS — F411 Generalized anxiety disorder: Secondary | ICD-10-CM

## 2023-01-23 DIAGNOSIS — F3342 Major depressive disorder, recurrent, in full remission: Secondary | ICD-10-CM

## 2023-01-23 DIAGNOSIS — F322 Major depressive disorder, single episode, severe without psychotic features: Secondary | ICD-10-CM

## 2023-01-23 DIAGNOSIS — G43711 Chronic migraine without aura, intractable, with status migrainosus: Secondary | ICD-10-CM

## 2023-01-23 DIAGNOSIS — F419 Anxiety disorder, unspecified: Secondary | ICD-10-CM

## 2023-01-23 DIAGNOSIS — Z8659 Personal history of other mental and behavioral disorders: Secondary | ICD-10-CM

## 2023-01-23 DIAGNOSIS — E559 Vitamin D deficiency, unspecified: Secondary | ICD-10-CM

## 2023-01-23 DIAGNOSIS — N870 Mild cervical dysplasia: Secondary | ICD-10-CM

## 2023-01-23 NOTE — Patient Instructions (Signed)
Raynaud's Phenomenon  Raynaud's phenomenon is a condition that affects the blood vessels (arteries) that carry blood to the fingers and toes. The arteries that supply blood to the ears, lips, nipples, or the tip of the nose might also be affected. Raynaud's phenomenon causes the arteries to become narrow temporarily (spasm). As a result, the flow of blood to the affected areas is temporarily decreased. This usually occurs in response to cold temperatures or stress. During an attack, the skin in the affected areas turns white, then blue, and finally red. A person may also feel tingling or numbness in those areas. Attacks usually last for only a brief period, and then the blood flow to the area returns to normal. In most cases, Raynaud's phenomenon does not cause serious health problems. What are the causes? In many cases, the cause of this condition is not known. The condition may occur on its own (primary Raynaud's phenomenon) or may be associated with other diseases or factors (secondary Raynaud's phenomenon). Possible causes may include: Diseases or medical conditions that damage the arteries. Injuries and repetitive actions that hurt the hands or feet. Being exposed to certain chemicals. Taking medicines that narrow the arteries. Other medical conditions, such as lupus, scleroderma, rheumatoid arthritis, thyroid problems, blood disorders, Sjogren syndrome, or atherosclerosis. What increases the risk? The following factors may make you more likely to develop this condition: Being 20-40 years old. Being female. Having a family history of Raynaud's phenomenon. Living in a cold climate. Smoking. What are the signs or symptoms? Symptoms of this condition usually occur when you are exposed to cold temperatures or when you have emotional stress. The symptoms may last for a few minutes or up to several hours. They usually affect your fingers but may also affect your toes, nipples, lips, ears, or the  tip of your nose. Symptoms may include: Changes in skin color. The skin in the affected areas will turn pale or white. The skin may then change from white to bluish to red as normal blood flow returns to the area. Numbness, tingling, or pain in the affected areas. In severe cases, symptoms may include: Skin sores. Tissues decaying and dying (gangrene). How is this diagnosed? This condition may be diagnosed based on: Your symptoms and medical history. A physical exam. During the exam, you may be asked to put your hands in cold water to check for a reaction to cold temperature. Tests, such as: Blood tests to check for other diseases or conditions. A test to check the movement of blood through your arteries and veins (vascular ultrasound). A test in which the skin at the base of your fingernail is examined under a microscope (nailfold capillaroscopy). How is this treated? During an episode, you can take actions to help symptoms go away faster. Options include moving your arms around in a windmill pattern, warming your fingers under warm water, or placing your fingers in a warm body fold, such as your armpit. Long-term treatment for this condition often involves making lifestyle changes and taking steps to control your exposure to cold temperature. For more severe cases, medicine (calcium channel blockers) may be used to improve blood circulation. Follow these instructions at home: Avoiding cold temperatures Take these steps to avoid exposure to cold: If possible, stay indoors during cold weather. When you go outside during cold weather, dress in layers and wear mittens, a hat, a scarf, and warm footwear. Wear mittens or gloves when handling ice or frozen food. Use holders for glasses or cans containing   cold drinks. Let warm water run for a while before taking a shower or bath. Warm up the car before driving in cold weather. Lifestyle If possible, avoid stressful and emotional situations. Try  to find ways to manage your stress, such as: Exercise. Yoga. Meditation. Biofeedback. Do not use any products that contain nicotine or tobacco. These products include cigarettes, chewing tobacco, and vaping devices, such as e-cigarettes. If you need help quitting, ask your health care provider. Avoid secondhand smoke. Limit your use of caffeine. Switch to decaffeinated coffee, tea, and soda. Avoid chocolate. Avoid vibrating tools and machinery. General instructions Protect your hands and feet from injuries, cuts, or bruises. Avoid wearing tight rings or wristbands. Wear loose fitting socks and comfortable, roomy shoes. Take over-the-counter and prescription medicines only as told by your health care provider. Where to find support Raynaud's Association: www.raynauds.org Where to find more information National Institute of Arthritis and Musculoskeletal and Skin Diseases: www.niams.nih.gov Contact a health care provider if: Your discomfort becomes worse despite lifestyle changes. You develop sores on your fingers or toes that do not heal. You have breaks in the skin on your fingers or toes. You have a fever. You have pain or swelling in your joints. You have a rash. Your symptoms occur on only one side of your body. Get help right away if: Your fingers or toes turn black. You have severe pain in the affected areas. These symptoms may represent a serious problem that is an emergency. Do not wait to see if the symptoms will go away. Get medical help right away. Call your local emergency services (911 in the [place] Do not drive yourself to the hospital. Summary Raynaud's phenomenon is a condition that affects the arteries that carry blood to the fingers, toes, ears, lips, nipples, or the tip of the nose. In many cases, the cause of this condition is not known. Symptoms of this condition include changes in skin color along with numbness and tingling in the affected area. Treatment for  this condition includes lifestyle changes and reducing exposure to cold temperatures. Medicines may be used for severe cases of the condition. Contact your health care provider if your condition worsens despite treatment. This information is not intended to replace advice given to you by your health care provider. Make sure you discuss any questions you have with your health care provider. Document Revised: 02/08/2021 Document Reviewed: 02/08/2021 Elsevier Patient Education  2023 [employer]  

## 2023-01-26 NOTE — Progress Notes (Unsigned)
Zach Bracken [place] [address] [place] Phone: [phone] Subjective:   [name], am serving as a scribe for Dr. Hulan Saas.  I'm seeing this patient by the request  of:  [name], PA-C  CC: back and neck pain   [id number]  Amanda [name] is a 27 y.o. female coming in with complaint of back and neck pain. OMT 12/292/2023. Patient states doing well. Working out more.  Medications patient has been prescribed:   Taking:  MRI cervical 2024 IMPRESSION: 1. Very mild multilevel cervical spondylosis as described above. No stenosis or impingement.  MRI brain 2024 IMPRESSION: Unremarkable non-contrast MRI appearance of the brain. No evidence of acute intracranial abnormality.       Reviewed prior external information including notes and imaging from previsou exam, outside providers and external EMR if available.   As well as notes that were available from care everywhere and other healthcare systems.  Past medical history, social, surgical and family history all reviewed in electronic medical record.  No pertanent information unless stated regarding to the chief complaint.   Past Medical History:  Diagnosis Date   BRCA negative 09/2020   MyRisk neg except AXIN2 VUS   Family history of adverse reaction to anesthesia    heart condition   Family history of breast cancer 09/2020   IBIS=15.7%/riskscore=14.7%   Family history of pancreatic cancer    Family history of thyroid disease    GERD (gastroesophageal reflux disease)    Migraine     Allergies  Allergen Reactions   Doxycycline Other (See Comments)    Exacerbates Migraines   Imitrex [Sumatriptan] Swelling    Facial swelling and burning lips   Penicillins Hives    Did it involve swelling of the face/tongue/throat, SOB, or low BP? No Did it involve sudden or severe rash/hives, skin peeling, or any reaction on the inside of your mouth or nose? Yes Did you need  to seek medical attention at a hospital or doctor's office? Yes When did it last happen?      childhood allergy If all above answers are "NO", may proceed with cephalosporin use.     Review of Systems:  No headache, visual changes, nausea, vomiting, diarrhea, constipation, dizziness, abdominal pain, skin rash, fevers, chills, night sweats, weight loss, swollen lymph nodes, body aches, joint swelling, chest pain, shortness of breath, mood changes. POSITIVE muscle aches  Objective  Blood pressure 112/74, pulse 95, height 5' 4"$  (1.626 m), weight 168 lb (76.2 kg), SpO2 98 %.   General: No apparent distress alert and oriented x3 mood and affect normal, dressed appropriately.  HEENT: Pupils equal, extraocular movements intact  Respiratory: Patient's speak in full sentences and does not appear short of breath  Cardiovascular: No lower extremity edema, non tender, no erythema  Neck exam does have some mild loss of lordosis.  Still has some tightness noted.  He continues to have thyromegaly   Osteopathic findings  C2 flexed rotated and side bent right C5 flexed rotated and side bent left T3 extended rotated and side bent right inhaled rib T9 extended rotated and side bent left L1 flexed rotated and side bent right Sacrum right on right       Assessment and Plan:  Neck pain Continues to have tightness noted.  Discussed icing regimen and home exercises, discussed which activities to do and which ones to avoid, increase activity slowly over the course the next several weeks.  Follow-up with me  again in 6 to 8 weeks are still in pain medication for also prednisone  Labs you want to try to the right decreases the pain  The course    Nonallopathic problems  Decision today to treat with OMT was based on Physical Exam  After verbal consent patient was treated with HVLA, ME, FPR techniques in cervical, rib, thoracic, lumbar, and sacral  areas  Patient tolerated the procedure well with  improvement in symptoms  Patient given exercises, stretches and lifestyle modifications  See medications in patient instructions if given  Patient will follow up in 4-8 weeks     The above documentation has been reviewed and is accurate and complete [name], DO         Note: This dictation was prepared with Dragon dictation along with smaller phrase technology. Any transcriptional errors that result from this process are unintentional.

## 2023-01-29 ENCOUNTER — Ambulatory Visit: Payer: Medicaid Other | Admitting: Family Medicine

## 2023-01-29 ENCOUNTER — Encounter: Payer: Self-pay | Admitting: Family Medicine

## 2023-01-29 VITALS — BP 112/74 | HR 95 | Ht 64.0 in | Wt 168.0 lb

## 2023-01-29 DIAGNOSIS — M9908 Segmental and somatic dysfunction of rib cage: Secondary | ICD-10-CM | POA: Diagnosis not present

## 2023-01-29 DIAGNOSIS — M9903 Segmental and somatic dysfunction of lumbar region: Secondary | ICD-10-CM | POA: Diagnosis not present

## 2023-01-29 DIAGNOSIS — M9901 Segmental and somatic dysfunction of cervical region: Secondary | ICD-10-CM

## 2023-01-29 DIAGNOSIS — M542 Cervicalgia: Secondary | ICD-10-CM | POA: Diagnosis not present

## 2023-01-29 DIAGNOSIS — M9902 Segmental and somatic dysfunction of thoracic region: Secondary | ICD-10-CM | POA: Diagnosis not present

## 2023-01-29 DIAGNOSIS — M9904 Segmental and somatic dysfunction of sacral region: Secondary | ICD-10-CM

## 2023-01-29 LAB — ANTI-SMITH ANTIBODY: ENA SM Ab Ser-aCnc: <1 AI

## 2023-01-29 LAB — ANTI-NUCLEAR AB-TITER (ANA TITER)
ANA TITER: 1:1280 {titer} — ABNORMAL HIGH
ANA Titer 1: 1:1280 {titer} — ABNORMAL HIGH

## 2023-01-29 LAB — PROTEIN / CREATININE RATIO, URINE
Creatinine, Urine: 161 mg/dL (ref 20–275)
Protein/Creat Ratio: 68 mg/g creat (ref 24–184)
Protein/Creatinine Ratio: 0.068 mg/mg creat (ref 0.024–0.184)
Total Protein, Urine: 11 mg/dL (ref 5–24)

## 2023-01-29 LAB — RNP ANTIBODY: Ribonucleic Protein(ENA) Antibody, IgG: <1 AI

## 2023-01-29 LAB — SJOGRENS SYNDROME-A EXTRACTABLE NUCLEAR ANTIBODY: SSA (Ro) (ENA) Antibody, IgG: <1 AI

## 2023-01-29 LAB — C3 AND C4
C3 Complement: 137 mg/dL (ref 83–193)
C4 Complement: 26 mg/dL (ref 15–57)

## 2023-01-29 LAB — ANTI-DNA ANTIBODY, DOUBLE-STRANDED: ds DNA Ab: <1 IU/mL

## 2023-01-29 LAB — CARDIOLIPIN ANTIBODIES, IGG, IGM, IGA
Anticardiolipin IgA: <2 APL-U/mL (ref <20.0)
Anticardiolipin IgG: <2 GPL-U/mL (ref <20.0)
Anticardiolipin IgM: <2 MPL-U/mL (ref <20.0)

## 2023-01-29 LAB — THYROGLOBULIN ANTIBODY: Thyroglobulin Ab: <1 IU/mL (ref <=1)

## 2023-01-29 LAB — BETA-2 GLYCOPROTEIN ANTIBODIES
Beta-2 Glyco 1 IgA: <2 U/mL (ref <20.0)
Beta-2 Glyco 1 IgM: <2 U/mL (ref <20.0)
Beta-2 Glyco I IgG: <2 U/mL (ref <20.0)

## 2023-01-29 LAB — ANA: Anti Nuclear Antibody (ANA): POSITIVE — AB

## 2023-01-29 LAB — SJOGRENS SYNDROME-B EXTRACTABLE NUCLEAR ANTIBODY: SSB (La) (ENA) Antibody, IgG: <1 AI

## 2023-01-29 LAB — THYROID PEROXIDASE ANTIBODY: Thyroperoxidase Ab SerPl-aCnc: 2 IU/mL (ref <9)

## 2023-01-29 LAB — ANTI-SCLERODERMA ANTIBODY: Scleroderma (Scl-70) (ENA) Antibody, IgG: <1 AI

## 2023-01-29 NOTE — Assessment & Plan Note (Addendum)
Continues to have tightness noted.  Discussed icing regimen and home exercises, discussed which activities to do and which ones to avoid, increase activity slowly over the course the next several weeks.  Follow-up with me again in 6 to 8 weeks

## 2023-01-29 NOTE — Progress Notes (Signed)
Will discuss results at the follow-up visit.

## 2023-01-29 NOTE — Patient Instructions (Addendum)
Iron 73m with 5054mVit C 3 days before menstruation to 3 days after Keep working out You're doing great Have a great trip See you again in 6 weeks

## 2023-01-30 NOTE — Progress Notes (Signed)
Office Visit Note  Patient: Amanda [name]             Date of Birth: Jan 22, 1996           MRN: [id number]             PCP: [name], PA-C Referring: [name], PA-C Visit Date: 02/13/2023 Occupation: '@GUAROCC'$ @  Subjective:  Positive ANA and Raynauds  History of Present Illness: Amanda [name] is a 27 y.o. female with history of Raynaud's and positive ANA.  She returns for follow-up visit today.  She states she has not noticed much discoloration in her hands and her feet since her last visit.  As the weather has warmed up her symptoms have not been as prominent.  She denies any history of oral ulcers, nasal ulcers, malar rash, photosensitivity, joint inflammation or lymphadenopathy.  She continues to workout on a regular basis.  She uses sunscreen.  She states she was seen by a dermatologist for acne and she is using topical agents.  She continues to have some stiffness in her neck and lower back for which she sees Dr. Tamala Julian.    Activities of Daily Living:  Patient reports morning stiffness for 0 minutes.   Patient Reports nocturnal pain.  Difficulty dressing/grooming: Denies Difficulty climbing stairs: Denies Difficulty getting out of chair: Denies Difficulty using hands for taps, buttons, cutlery, and/or writing: Denies  Review of Systems  Constitutional: Negative.  Negative for fatigue.  HENT:  Positive for mouth dryness. Negative for mouth sores.   Eyes: Negative.  Negative for dryness.  Respiratory: Negative.  Negative for shortness of breath.   Cardiovascular:  Negative for chest pain and palpitations.  Gastrointestinal:  Positive for diarrhea. Negative for blood in stool and constipation.  Endocrine: Negative.  Negative for increased urination.  Genitourinary: Negative.  Negative for involuntary urination.  Musculoskeletal:  Positive for muscle tenderness. Negative for joint pain, gait problem, joint pain, joint swelling, myalgias, muscle weakness, morning  stiffness and myalgias.  Skin:  Positive for sensitivity to sunlight. Negative for color change, rash and hair loss.  Allergic/Immunologic: Negative.  Negative for susceptible to infections.  Neurological: Negative.  Negative for dizziness and headaches.  Hematological: Negative.  Negative for swollen glands.  Psychiatric/Behavioral:  Positive for sleep disturbance. Negative for depressed mood. The patient is nervous/anxious.     PMFS History:  Patient Active Problem List   Diagnosis Date Noted   Raynaud's syndrome 12/15/2022   Insomnia 07/21/2022   Neck pain 07/10/2022   ANA positive 07/10/2022   Hot flashes 07/10/2022   Sinusitis 07/04/2022   Thyromegaly 07/04/2022   Adenoid vegetations 06/21/2022   MDD (major depressive disorder), recurrent, in full remission ([place]) 06/06/2022   Abscess of groin, right 06/05/2022   Inguinal lymphadenopathy 03/08/2022   MDD (major depressive disorder), recurrent, in partial remission ([place]) 03/06/2022   MDD (major depressive disorder), recurrent episode, mild ([place]) 02/02/2022   Intractable migraine with aura with status migrainosus 01/31/2022   Depression, major, single episode, severe ([place]) 01/31/2022   Cervicogenic headache 12/15/2021   Somatic dysfunction of cervical region 12/15/2021   Neck muscle spasm 11/03/2021   GAD (generalized anxiety disorder) 11/03/2021   History of affective disorder 11/03/2021   CIN I (cervical intraepithelial neoplasia I) 02/14/2021   Genital herpes simplex type 2 02/10/2021   Vitamin D deficiency 06/28/2020   History of chronic sinusitis 06/28/2020   Monochorionic diamniotic twin gestation 05/20/2019   Intractable chronic migraine without aura with status migrainosus 02/11/2017  Vision loss of left eye 01/17/2017   Acne 11/18/2014    Past Medical History:  Diagnosis Date   BRCA negative 09/2020   MyRisk neg except AXIN2 VUS   Family history of adverse reaction to anesthesia    heart condition   Family  history of breast cancer 09/2020   IBIS=15.7%/riskscore=14.7%   Family history of pancreatic cancer    Family history of thyroid disease    GERD (gastroesophageal reflux disease)    Migraine     Family History  Problem Relation Age of Onset   Migraines Mother    Skin cancer Father    Asthma Maternal Grandmother    Hypertension Maternal Grandmother    Thyroid disease Maternal Grandmother    Hypertension Maternal Grandfather    Pancreatic cancer Maternal Grandfather 79   Colon cancer Maternal Grandfather 60   Hypertension Paternal Grandmother    Diabetes Paternal Grandmother    Hypertension Paternal Grandfather    Diabetes Paternal Grandfather    Colon cancer Paternal Grandfather 21   Thyroid disease Maternal Aunt    Breast cancer Maternal Aunt 71   Allergies Brother    Past Surgical History:  Procedure Laterality Date   NO PAST SURGERIES     TONSILLECTOMY Bilateral 09/05/2022   Procedure: TONSILLECTOMY;  Surgeon: Clyde Canterbury, MD;  Location: [facility];  Service: ENT;  Laterality: Bilateral;   WISDOM TOOTH EXTRACTION     Social History   Social History Narrative   ** Merged History Encounter **       One story home Right-handed Caffeine: occasional coffee   Immunization History  Administered Date(s) Administered   DTaP 04/15/1998, 06/10/1998, 08/12/1998, 02/17/1999, 05/15/2003   HIB (PRP-OMP) 04/15/1998, 06/10/1998, 07/21/1999   HPV Quadrivalent 09/23/2009, 11/24/2009, 09/27/2010   Hepatitis A 08/20/2007, 08/28/2008   Hepatitis B 04/15/1998, 06/10/1998, 07/21/1999   IPV 04/15/1998, 06/10/1998, 02/17/1999, 05/15/2003   Influenza Split 12/31/2005, 10/27/2008, 09/06/2012   Influenza Whole 09/30/2009   Influenza,inj,Quad PF,6+ Mos 09/11/2013, 09/04/2014, 08/20/2015, 08/29/2016, 10/04/2017, 08/20/2018, 08/13/2019, 11/03/2021, 12/21/2022   Influenza-Unspecified 09/06/2012   MMR 07/21/1999, 05/15/2003   Meningococcal Conjugate 08/20/2015   Meningococcal  polysaccharide vaccine (MPSV4) 09/23/2009   PPD Test 03/13/2016, 03/20/2016   Td 08/28/2008   Tdap 08/28/2008, 08/22/2018, 10/21/2019   Varicella 02/17/1999, 08/20/2007     Objective: Vital Signs: BP 94/67 (BP Location: Left Arm, Patient Position: Sitting, Cuff Size: Normal)   Pulse 96   Resp 16   Ht '5\' 4"'$  (1.626 m)   Wt 167 lb 6.4 oz (75.9 kg)   BMI 28.73 kg/m    Physical Exam Vitals and nursing note reviewed.  Constitutional:      Appearance: She is well-developed.  HENT:     Head: Normocephalic and atraumatic.  Eyes:     Conjunctiva/sclera: Conjunctivae normal.  Cardiovascular:     Rate and Rhythm: Normal rate and regular rhythm.     Heart sounds: Normal heart sounds.  Pulmonary:     Effort: Pulmonary effort is normal.     Breath sounds: Normal breath sounds.  Abdominal:     General: Bowel sounds are normal.     Palpations: Abdomen is soft.  Musculoskeletal:     Cervical back: Normal range of motion.  Lymphadenopathy:     Cervical: No cervical adenopathy.  Skin:    General: Skin is warm and dry.     Capillary Refill: Capillary refill takes less than 2 seconds.     Comments: Mild acne was noted over forehead.  No  nailbed capillary changes or sclerodactyly was noted.  Neurological:     Mental Status: She is alert and oriented to person, place, and time.  Psychiatric:        Behavior: Behavior normal.      Musculoskeletal Exam: Cervical, thoracic and lumbar spine were in good range of motion.  Shoulder joints, elbow joints, wrist joints, MCPs PIPs and DIPs with good range of motion with no synovitis.  Hip joints, knee joints, ankles, MTPs and PIPs been good range of motion with no synovitis.  CDAI Exam: CDAI Score: -- Patient Global: --; Provider Global: -- Swollen: --; Tender: -- Joint Exam 02/13/2023   No joint exam has been documented for this visit   There is currently no information documented on the homunculus. Go to the Rheumatology activity and  complete the homunculus joint exam.  Investigation: No additional findings.  Imaging: No results found.  Recent Labs: Lab Results  Component Value Date   WBC 8.1 07/04/2022   HGB 12.9 07/04/2022   PLT 237.0 07/04/2022   NA 136 07/04/2022   K 3.7 07/04/2022   CL 104 07/04/2022   CO2 27 07/04/2022   GLUCOSE 79 07/04/2022   BUN 13 07/04/2022   CREATININE 0.74 07/04/2022   BILITOT 0.6 07/04/2022   ALKPHOS 54 07/04/2022   AST 15 07/04/2022   ALT 5 07/04/2022   PROT 7.4 07/04/2022   ALBUMIN 4.5 07/04/2022   CALCIUM 9.5 07/04/2022   GFRAA 131 06/28/2020     Speciality Comments: No specialty comments available.  Procedures:  No procedures performed Allergies: Doxycycline, Imitrex [sumatriptan], and Penicillins   Assessment / Plan:     Visit Diagnoses: Positive ANA (antinuclear antibody) -patient had ANA 1: 640NS on July 04, 2021.  Her sed rate was normal.  She gives history of mild Raynauds symptoms.  Labs obtained on January 23, 2023 ANA 1: 1280NS and 1: 1280NH, ENA (SCL 70, RNP, Smith, SSA, SSB, dsDNA) negative, anticardiolipin negative, beta-2 GP 1 negative, C3-C4 normal, urine protein creatinine ratio normal, thyroglobulin antibody negative, TPO antibody were reviewed with the patient.  She has significant ANA titer but does not have any clinical features of autoimmune disease at this point.  She gives history of mild Raynauds symptoms.  There is no history of oral ulcers, nasal ulcers, malar rash, lymphadenopathy, photosensitivity or arthritis.  I advised her to contact me if she develops any new symptoms.  My plan is to repeat autoimmune labs in a year and see her back in 1 year unless she develops any new symptoms.  Raynaud's disease without gangrene - no sclerodactyly, nailbed capillary changes were noted.  There is no history of digital ulcers.  Somatic dysfunction of cervical region - Followed by Dr. Tamala Julian.  She gives history of tingling in her hands.  MRI of the brain  recent sleep was normal and MRI of the cervical spine showed mild changes.  Chronic midline low back pain without sciatica - Recent x-rays were unremarkable.  She is followed by Dr. Tamala Julian.  Anxiety and depression  Intractable chronic migraine without aura with status migrainosus  Vitamin D deficiency  Thyromegaly - TPO and thyroglobulin antibodies were negative.  Findings were discussed with the patient.  Genital herpes simplex type 2  CIN I (cervical intraepithelial neoplasia I)  Orders: Orders Placed This Encounter  Procedures   Protein / creatinine ratio, urine   CBC with Differential/Platelet   COMPLETE METABOLIC PANEL WITH GFR   ANA   Anti-DNA antibody, double-stranded  C3 and C4   Sedimentation rate   No orders of the defined types were placed in this encounter.    Follow-Up Instructions: Return in about 1 year (around 02/14/2024) for Positive ANA, Raynauds.   Bo Merino, MD  Note - This record has been created using Editor, commissioning.  Chart creation errors have been sought, but may not always  have been located. Such creation errors do not reflect on  the standard of medical care.

## 2023-01-31 ENCOUNTER — Ambulatory Visit: Payer: Medicaid Other | Admitting: Dermatology

## 2023-01-31 DIAGNOSIS — L700 Acne vulgaris: Secondary | ICD-10-CM

## 2023-01-31 MED ORDER — MINOCYCLINE HCL 100 MG PO TABS: 100.0000 mg | ORAL_TABLET | Freq: Every day | ORAL | 3 refills | Status: DC

## 2023-01-31 MED ORDER — CLINDAMYCIN PHOS-BENZOYL PEROX 1.2-5 % EX GEL: CUTANEOUS | 3 refills | Status: DC

## 2023-01-31 MED ORDER — ADAPALENE 0.3 % EX GEL: CUTANEOUS | 3 refills | Status: DC

## 2023-01-31 NOTE — Patient Instructions (Signed)
Recommend OTC benzoyl peroxide cleanser, wash affected areas daily in shower, let sit several minutes prior to rinsing.  May bleach towels if not rinsed off completely.  Recommended brands include Panoxyl 4% Creamy Wash, CeraVe Acne Foaming Cream wash, or Cetaphil Gentle Clear Complexion-Clearing BPO Acne Cleanser.  Due to recent changes in healthcare laws, you may see results of your pathology and/or laboratory studies on MyChart before the doctors have had a chance to review them. We understand that in some cases there may be results that are confusing or concerning to you. Please understand that not all results are received at the same time and often the doctors may need to interpret multiple results in order to provide you with the best plan of care or course of treatment. Therefore, we ask that you please give [place] 2 business days to thoroughly review all your results before contacting the office for clarification. Should we see a critical lab result, you will be contacted sooner.   If You Need Anything After Your Visit  If you have any questions or concerns for your doctor, please call our main line at [phone] and press option 4 to reach your doctor's medical assistant. If no one answers, please leave a voicemail as directed and we will return your call as soon as possible. Messages left after 4 pm will be answered the following business day.   You may also send [place] a message via [place]. We typically respond to MyChart messages within 1-2 business days.  For prescription refills, please ask your pharmacy to contact our office. Our fax number is [phone].  If you have an urgent issue when the clinic is closed that cannot wait until the next business day, you can page your doctor at the number below.    Please note that while we do our best to be available for urgent issues outside of office hours, we are not available 24/7.   If you have an urgent issue and are unable to reach [place], you  may choose to seek medical care at your doctor's office, retail clinic, urgent care center, or emergency room.  If you have a medical emergency, please immediately call 911 or go to the emergency department.  Pager Numbers  - Dr. Nehemiah Massed: [phone]  - Dr. Laurence Ferrari: [phone]  - Dr. Nicole Kindred: [phone]  In the event of inclement weather, please call our main line at [phone] for an update on the status of any delays or closures.  Dermatology Medication Tips: Please keep the boxes that topical medications come in in order to help keep track of the instructions about where and how to use these. Pharmacies typically print the medication instructions only on the boxes and not directly on the medication tubes.   If your medication is too expensive, please contact our office at [phone] option 4 or send [place] a message through [place].   We are unable to tell what your co-pay for medications will be in advance as this is different depending on your insurance coverage. However, we may be able to find a substitute medication at lower cost or fill out paperwork to get insurance to cover a needed medication.   If a prior authorization is required to get your medication covered by your insurance company, please allow [place] 1-2 business days to complete this process.  Drug prices often vary depending on where the prescription is filled and some pharmacies may offer cheaper prices.  The website www.goodrx.com contains coupons for medications through different pharmacies. The  prices here do not account for what the cost may be with help from insurance (it may be cheaper with your insurance), but the website can give you the price if you did not use any insurance.  - You can print the associated coupon and take it with your prescription to the pharmacy.  - You may also stop by our office during regular business hours and pick up a GoodRx coupon card.  - If you need your prescription sent  electronically to a different pharmacy, notify our office through [facility] or by phone at [phone] option 4.     Si Usted Necesita Algo Despus de Su Visita  Tambin puede enviarnos un mensaje a travs de Pharmacist, community. Por lo general respondemos a los mensajes de MyChart en el transcurso de 1 a 2 das hbiles.  Para renovar recetas, por favor pida a su farmacia que se ponga en contacto con nuestra oficina. Harland Dingwall de fax es [place] [phone].  Si tiene un asunto urgente cuando la clnica est cerrada y que no puede esperar hasta el siguiente da hbil, puede llamar/localizar a su doctor(a) al nmero que aparece a continuacin.   Por favor, tenga en cuenta que aunque hacemos todo lo posible para estar disponibles para asuntos urgentes fuera del horario de [place], no estamos disponibles las 24 horas del da, los 7 das de la [place].   Si tiene un problema urgente y no puede comunicarse con nosotros, puede optar por buscar atencin mdica  en el consultorio de su doctor(a), en una clnica privada, en un centro de atencin urgente o en una sala de emergencias.  Si tiene Engineering geologist, por favor llame inmediatamente al 911 o vaya a la sala de emergencias.  Nmeros de bper  - Dr. Nehemiah Massed: [phone]  - Dra. Moye: [phone]  - Dra. Nicole Kindred: [phone]  En caso de inclemencias del [place], por favor llame a Johnsie Kindred principal al [phone] para una actualizacin sobre el [place] de cualquier retraso o cierre.  Consejos para la medicacin en dermatologa: Por favor, guarde las cajas en las que vienen los medicamentos de uso tpico para ayudarle a seguir las instrucciones sobre dnde y cmo usarlos. Las farmacias generalmente imprimen las instrucciones del medicamento slo en las cajas y no directamente en los tubos del [place].   Si su medicamento es muy caro, por favor, pngase en contacto con Zigmund Daniel llamando al [phone] y presione la  opcin 4 o envenos un mensaje a travs de Pharmacist, community.   No podemos decirle cul ser su copago por los medicamentos por adelantado ya que esto es diferente dependiendo de la cobertura de su seguro. Sin embargo, es posible que podamos encontrar un medicamento sustituto a Electrical engineer un formulario para que el seguro cubra el medicamento que se considera necesario.   Si se requiere una autorizacin previa para que su compaa de seguros [place] su medicamento, por favor permtanos de 1 a 2 das hbiles para completar este proceso.  Los precios de los medicamentos varan con frecuencia dependiendo del Environmental consultant de dnde se surte la receta y alguna farmacias pueden ofrecer precios ms baratos.  El sitio web www.goodrx.com tiene cupones para medicamentos de Airline pilot. Los precios aqu no tienen en cuenta lo que podra costar con la ayuda del seguro (puede ser ms barato con su seguro), pero el sitio web puede darle el precio si no utiliz Research scientist (physical sciences).  - Puede imprimir el cupn correspondiente y llevarlo con su receta a  la farmacia.  - Tambin puede pasar por nuestra oficina durante el horario de atencin regular y Charity fundraiser una tarjeta de cupones de GoodRx.  - Si necesita que su receta se enve electrnicamente a una farmacia diferente, informe a nuestra oficina a travs de MyChart de [place] o por telfono llamando al [phone] y presione la opcin 4.

## 2023-01-31 NOTE — Progress Notes (Signed)
   Follow-Up Visit   Subjective  Amanda [name] is a 27 y.o. female who presents for the following: Acne (6 month follow up Adapalene 03% gel qd, Duac gel qd. Stopped taking Spironolactone 50 mg - acne has gotten worse especially on her back). Spironolactone didn't help, Doxycycline gave her migraines.   The following portions of the chart were reviewed this encounter and updated as appropriate:       Review of Systems:  No other skin or systemic complaints except as noted in HPI or Assessment and Plan.  Objective  Well appearing patient in no apparent distress; mood and affect are within normal limits.  A focused examination was performed including face, back. Relevant physical exam findings are noted in the Assessment and Plan.  Face, back Inflammatory papules of upper back, cheeks and forehead    Assessment & Plan  Acne vulgaris Face, back  Chronic and persistent condition with duration or expected duration over one year. Condition is symptomatic/ bothersome to patient. Not currently at goal.   She can not take doxycycline because it exacerbates her migraines. Spironolactone did not work.  Start Minocycline 100 mg 1 po qd, observe for side effects  Continue Adapalene 0.3% gel qhs Continue Duac gel qd  Recommend OTC benzoyl peroxide cleanser, wash affected areas daily in shower including back, let sit several minutes prior to rinsing.  May bleach towels if not rinsed off completely.  Recommended brands include Panoxyl 4% Creamy Wash, CeraVe Acne Foaming Cream wash, or Cetaphil Gentle Clear Complexion-Clearing BPO Acne Cleanser.  May consider Isotretinoin in the future if not improved. Discussed potential side effects.  Topical retinoid medications like tretinoin/Retin-A, adapalene/Differin, tazarotene/Fabior, and Epiduo/Epiduo Forte can cause dryness and irritation when first started. Only apply a pea-sized amount to the entire affected area. Avoid applying it around the  eyes, edges of mouth and creases at the nose. If you experience irritation, use a good moisturizer first and/or apply the medicine less often. If you are doing well with the medicine, you can increase how often you use it until you are applying every night. Be careful with sun protection while using this medication as it can make you sensitive to the sun. This medicine should not be used by pregnant women.     minocycline (DYNACIN) 100 MG tablet - Face, back Take 1 tablet (100 mg total) by mouth daily.  Clindamycin-Benzoyl Per, Refr, (DUAC) gel - Face, back Apply to face qam, wash off qhs  Adapalene (DIFFERIN) 0.3 % gel - Face, back Apply to face qhs, wash off qam   Return in about 10 weeks (around 04/11/2023) for Acne.  Documentation: I have reviewed the above documentation for accuracy and completeness, and I agree with the above.  Brendolyn Patty MD

## 2023-02-03 ENCOUNTER — Other Ambulatory Visit: Payer: Self-pay | Admitting: Psychiatry

## 2023-02-03 DIAGNOSIS — G4700 Insomnia, unspecified: Secondary | ICD-10-CM

## 2023-02-05 ENCOUNTER — Ambulatory Visit (INDEPENDENT_AMBULATORY_CARE_PROVIDER_SITE_OTHER): Payer: Medicaid Other | Admitting: Licensed Clinical Social Worker

## 2023-02-05 DIAGNOSIS — F3341 Major depressive disorder, recurrent, in partial remission: Secondary | ICD-10-CM

## 2023-02-05 DIAGNOSIS — F411 Generalized anxiety disorder: Secondary | ICD-10-CM | POA: Diagnosis not present

## 2023-02-05 NOTE — Progress Notes (Unsigned)
Virtual Visit via Video Note  I connected with Amanda [name] on 02/05/23 at  9:00 AM EST by a video enabled telemedicine application and verified that I am speaking with the correct person using two identifiers.  Location: Patient: home Provider: remote office [place], [place])   I discussed the limitations of evaluation and management by telemedicine and the availability of in person appointments. The patient expressed understanding and agreed to proceed.  I discussed the assessment and treatment plan with the patient. The patient was provided an opportunity to ask questions and all were answered. The patient agreed with the plan and demonstrated an understanding of the instructions.   The patient was advised to call back or seek an in-person evaluation if the symptoms worsen or if the condition fails to improve as anticipated.  I provided 50 minutes of non-face-to-face time during this encounter.   [place], LCSW   THERAPIST PROGRESS NOTE  Session Time: [phone]  Participation Level: Active  Behavioral Response: NeatAlertAnxious  Type of Therapy: Individual Therapy  Treatment Goals addressed:    ProgressTowards Goals: Progressing  Interventions: CBT  Summary: Amanda [name] is a 27 y.o. female who presents with continuing symptoms related to anxiety.  Patient reports that she is compliant with her medication (Lexapro)  and is compliant with her psychiatric appointments.  Patient reports fair quality and quantity of sleep.  Reviewed sleep hygiene tips and importance of bedtime routine.  Allowed pt to explore and express thoughts and feelings associated with recent life situations and external stressors.  Patient reports that 1 big stressor currently is that her fianc is experiencing depression, and patient feels that she does not know how to support him and his emotional health.  Provided patient with documentation about supporting individuals with depression.  Patient  states he recently had an episode where the doctors made the statement that he could have ADHD or bipolar disorder.  Patient feels that her fianc's family are turning around on her and making his mental health her " problem and responsibility".    Patient reports another concern is ongoing financial strain.  Patient states that because of this her fianc and fianc's family are putting pressure on her to go back to work.  Patient states that she has 38-year-old twins and does not know how she could find a job that would make enough money to cover daycare expenses.  Allow patient safe space to explore her perspective and provided patient with unconditional validation and support.  Patient states that she is going to visit family members in [place] in March, and is looking forward to this.  Patient reports that she gets upset about family members getting involved in the children's lives, and reported several incidents where she got upset when individuals would wake up her children while they were staying at their house.  Patient reports that she gets very angry when family members do not do things the way that she tells them to do in regards to her children.  Patient reports that her husband feels the same way.  Explored current health status--patient states that she is continuing to have some headaches, and muscle spasms at the base of her neck.  Patient reports that she is trying hard to manage this, and to make some positive overall health choices including improving and nutrition, reading more, working out, and drinking more water.  Continued recommendations are as follows: self care behaviors, positive social engagements, focusing on overall work/home/life balance, and focusing on  positive physical and emotional wellness.   Suicidal/Homicidal: No  Therapist Response: Pt is continuing to apply interventions learned in session into daily life situations. Pt is currently on track to meet goals  utilizing interventions mentioned above. Personal growth and progress noted. Treatment to continue as indicated.   Encouraged patient to continue engaging in positive behavioral activities, and encouraged patient to challenge negative thoughts.  Assisted patient in exploring the benefits and consequences of going back to work versus staying home.  Plan: Return again in 4 weeks.  Diagnosis:  Encounter Diagnoses  Name Primary?   GAD (generalized anxiety disorder) Yes   MDD (major depressive disorder), recurrent, in partial remission ([place])     Collaboration of Care: Other pt encouraged to continue with psychiatrist of record, Dr. Ursula Alert  Patient/Guardian was advised Release of Information must be obtained prior to any record release in order to collaborate their care with an outside provider. Patient/Guardian was advised if they have not already done so to contact the registration department to sign all necessary forms in order for [place] to release information regarding their care.   Consent: Patient/Guardian gives verbal consent for treatment and assignment of benefits for services provided during this visit. Patient/Guardian expressed understanding and agreed to proceed.   [place], LCSW 02/05/2023

## 2023-02-06 ENCOUNTER — Encounter: Payer: Self-pay | Admitting: Dermatology

## 2023-02-13 ENCOUNTER — Ambulatory Visit: Payer: Medicaid Other | Attending: Rheumatology | Admitting: Rheumatology

## 2023-02-13 ENCOUNTER — Encounter: Payer: Self-pay | Admitting: Family Medicine

## 2023-02-13 ENCOUNTER — Encounter: Payer: Self-pay | Admitting: Rheumatology

## 2023-02-13 ENCOUNTER — Encounter: Payer: Self-pay | Admitting: Physician Assistant

## 2023-02-13 VITALS — BP 94/67 | HR 96 | Resp 16 | Ht 64.0 in | Wt 167.4 lb

## 2023-02-13 DIAGNOSIS — F32A Depression, unspecified: Secondary | ICD-10-CM

## 2023-02-13 DIAGNOSIS — A6000 Herpesviral infection of urogenital system, unspecified: Secondary | ICD-10-CM

## 2023-02-13 DIAGNOSIS — M545 Low back pain, unspecified: Secondary | ICD-10-CM | POA: Diagnosis not present

## 2023-02-13 DIAGNOSIS — R768 Other specified abnormal immunological findings in serum: Secondary | ICD-10-CM | POA: Diagnosis not present

## 2023-02-13 DIAGNOSIS — G8929 Other chronic pain: Secondary | ICD-10-CM

## 2023-02-13 DIAGNOSIS — M9901 Segmental and somatic dysfunction of cervical region: Secondary | ICD-10-CM

## 2023-02-13 DIAGNOSIS — E559 Vitamin D deficiency, unspecified: Secondary | ICD-10-CM

## 2023-02-13 DIAGNOSIS — I7300 Raynaud's syndrome without gangrene: Secondary | ICD-10-CM

## 2023-02-13 DIAGNOSIS — F419 Anxiety disorder, unspecified: Secondary | ICD-10-CM

## 2023-02-13 DIAGNOSIS — G43711 Chronic migraine without aura, intractable, with status migrainosus: Secondary | ICD-10-CM

## 2023-02-13 DIAGNOSIS — N870 Mild cervical dysplasia: Secondary | ICD-10-CM

## 2023-02-13 DIAGNOSIS — E010 Iodine-deficiency related diffuse (endemic) goiter: Secondary | ICD-10-CM

## 2023-02-17 ENCOUNTER — Other Ambulatory Visit: Payer: Self-pay | Admitting: Psychiatry

## 2023-02-17 DIAGNOSIS — F3342 Major depressive disorder, recurrent, in full remission: Secondary | ICD-10-CM

## 2023-02-17 DIAGNOSIS — F411 Generalized anxiety disorder: Secondary | ICD-10-CM

## 2023-02-25 ENCOUNTER — Encounter (INDEPENDENT_AMBULATORY_CARE_PROVIDER_SITE_OTHER): Payer: Medicaid Other | Admitting: Physician Assistant

## 2023-02-25 DIAGNOSIS — R609 Edema, unspecified: Secondary | ICD-10-CM | POA: Diagnosis not present

## 2023-02-27 ENCOUNTER — Other Ambulatory Visit: Payer: Self-pay

## 2023-02-27 ENCOUNTER — Other Ambulatory Visit: Payer: Self-pay | Admitting: Physician Assistant

## 2023-02-27 ENCOUNTER — Encounter: Payer: Self-pay | Admitting: Family Medicine

## 2023-02-27 ENCOUNTER — Encounter: Payer: Self-pay | Admitting: Physician Assistant

## 2023-02-27 DIAGNOSIS — E079 Disorder of thyroid, unspecified: Secondary | ICD-10-CM

## 2023-02-27 DIAGNOSIS — E010 Iodine-deficiency related diffuse (endemic) goiter: Secondary | ICD-10-CM

## 2023-02-27 NOTE — Telephone Encounter (Signed)
Please see the MyChart message reply(ies) for my assessment and plan.  ?  ?This patient gave consent for this Medical Advice Message and is aware that it may result in a bill to their insurance company, as well as the possibility of receiving a bill for a co-payment or deductible. They are an established patient, but are not seeking medical advice exclusively about a problem treated during an in person or video visit in the last seven days. I did not recommend an in person or video visit within seven days of my reply.  ?  ?I spent a total of 7 minutes cumulative time within 7 days through MyChart messaging. ? ?Lashannon Bresnan, PA-C  ? ?

## 2023-03-06 ENCOUNTER — Telehealth (INDEPENDENT_AMBULATORY_CARE_PROVIDER_SITE_OTHER): Payer: Medicaid Other | Admitting: Psychiatry

## 2023-03-06 ENCOUNTER — Encounter: Payer: Self-pay | Admitting: Physician Assistant

## 2023-03-06 DIAGNOSIS — F411 Generalized anxiety disorder: Secondary | ICD-10-CM

## 2023-03-06 NOTE — Progress Notes (Signed)
Patient connected at the time of the appointment however reports she is currently in [place].  Since patient is out of state discussed with patient, will not be able to complete this evaluation today.  Patient states she did not know, otherwise she would have rescheduled it.  Provided patient another appointment for late March.  I have also communicated with staff.

## 2023-03-08 ENCOUNTER — Ambulatory Visit: Payer: Medicaid Other | Admitting: Family Medicine

## 2023-03-12 ENCOUNTER — Ambulatory Visit: Payer: Medicaid Other

## 2023-03-12 ENCOUNTER — Encounter: Payer: Self-pay | Admitting: Rheumatology

## 2023-03-13 NOTE — Telephone Encounter (Signed)
Ro and La antibodies were negative on 01/23/23.     Recommend the use of refresh or systane eye drops for dry eyes.   Biotene products for dry mouth. Can use vasoline or aquaphor for dry lips.  Recommend scheduling follow up visit if symptoms persist or worsen.

## 2023-03-15 ENCOUNTER — Telehealth: Payer: Medicaid Other | Admitting: Psychiatry

## 2023-03-27 ENCOUNTER — Ambulatory Visit (INDEPENDENT_AMBULATORY_CARE_PROVIDER_SITE_OTHER): Payer: Medicaid Other | Admitting: Licensed Clinical Social Worker

## 2023-03-27 DIAGNOSIS — F411 Generalized anxiety disorder: Secondary | ICD-10-CM

## 2023-03-27 NOTE — Patient Instructions (Addendum)
Download PTSD Coach app.  Once downloaded, go on "settings", "personalize", then disable "distress meter".  This app is based off Cognitive Behavioral Therapy and Trauma therapy.  Its a great tool to help with anxiety/stress management.

## 2023-03-27 NOTE — Progress Notes (Unsigned)
Virtual Visit via Video Note  I connected with Rosina Lowenstein on 03/27/23 at  9:00 AM EDT by a video enabled telemedicine application and verified that I am speaking with the correct person using two identifiers.  Location: Patient: home Provider: remote office [place], [place])   I discussed the limitations of evaluation and management by telemedicine and the availability of in person appointments. The patient expressed understanding and agreed to proceed.  I discussed the assessment and treatment plan with the patient. The patient was provided an opportunity to ask questions and all were answered. The patient agreed with the plan and demonstrated an understanding of the instructions.   The patient was advised to call back or seek an in-person evaluation if the symptoms worsen or if the condition fails to improve as anticipated.  I provided 45 minutes of non-face-to-face time during this encounter.   Arush Gatliff R Kamillah Didonato, LCSW   THERAPIST PROGRESS NOTE  Session Time: [phone]  Participation Level: Active  Behavioral Response: NeatAlertAnxious  Type of Therapy: Individual Therapy  Treatment Goals addressed:    ProgressTowards Goals: Progressing  Interventions: CBT  Summary: SONNA VANDERGRIFF is a 27 y.o. female who presents with continuing symptoms related to anxiety.  Patient reports that she is compliant with her medication (Lexapro)  and is compliant with her psychiatric appointments.  Patient reports fair quality and quantity of sleep.    Allowed pt to explore and express thoughts and feelings associated with recent life situations and external stressors.  Assisted patient with identifying situations that have triggered anxiety and stress.  Admission reviewed current coping skills and recommended additional coping skills for assisting patient with overall anxiety management.  Encouraged patient to continue engaging in prosocial behaviors, and use positive communication skills with  family members and extended family members.  Patient states that she is adjusting after coming home from vacation--patient was in [place] weeks with family members.  Patient states that it is hard to adjust back to doing everything on her own when she had the very close supports of her family members while she was in [place]  Patient states that she has additional stress associated with her dog's recent diagnosis of diabetes.  Patient is learning how to manage this disorder with medication.  Patient states that her mother is being very financially supportive in helping with the added expense of taking care of her dog.  Discussed behaviors of her daughters, who are 3.  Patient states that she compares her daughters to others, and has concerns about their behavior.  Encouraged patient to do her own and independent research about twins, twin development, and twin behavior.  Encouraged patient to find time for self-care, and engaging in behaviors that assist with overall life balance.  Continued recommendations are as follows: self care behaviors, positive social engagements, focusing on overall work/home/life balance, and focusing on positive physical and emotional wellness.   Suicidal/Homicidal: No  Therapist Response: Pt is continuing to apply interventions learned in session into daily life situations. Pt is currently on track to meet goals utilizing interventions mentioned above. Personal growth and progress noted. Treatment to continue as indicated.   Encouraged patient to continue engaging in positive behavioral activities, and encouraged patient to challenge negative thoughts.  Assisted patient in exploring the benefits and consequences of going back to work versus staying home.  Plan: Return again in 4 weeks.  Diagnosis:  Encounter Diagnosis  Name Primary?   GAD (generalized anxiety disorder) Yes  Collaboration of Care: Other pt encouraged to continue with psychiatrist  of record, Dr. Jomarie Longs  Patient/Guardian was advised Release of Information must be obtained prior to any record release in order to collaborate their care with an outside provider. Patient/Guardian was advised if they have not already done so to contact the registration department to sign all necessary forms in order for [place] to release information regarding their care.   Consent: Patient/Guardian gives verbal consent for treatment and assignment of benefits for services provided during this visit. Patient/Guardian expressed understanding and agreed to proceed.   Ernest Haber Albertina Leise, LCSW 03/27/2023

## 2023-03-28 ENCOUNTER — Encounter: Payer: Self-pay | Admitting: Rheumatology

## 2023-03-28 ENCOUNTER — Encounter (INDEPENDENT_AMBULATORY_CARE_PROVIDER_SITE_OTHER): Payer: Medicaid Other | Admitting: Physician Assistant

## 2023-03-28 DIAGNOSIS — L509 Urticaria, unspecified: Secondary | ICD-10-CM | POA: Diagnosis not present

## 2023-03-28 NOTE — Telephone Encounter (Signed)

## 2023-03-28 NOTE — Telephone Encounter (Signed)
Referral placed.

## 2023-03-28 NOTE — Telephone Encounter (Signed)
I called patient and discussed that the pictures appear to be consistent with hives.  Patient will see her dermatologist.  She would also like a referral to an allergist.  Please place a referral to allergist for recurrent hives.

## 2023-04-03 NOTE — Progress Notes (Unsigned)
[facility] [address] [place] [address] Phone: [phone] Subjective:   Amanda [name], am serving as a scribe for Dr. Antoine Primas.  I'm seeing this patient by the request  of:  Alfredia Ferguson, PA-C  CC: Neck and back pain follow-up  [id number]  Amanda [name] is a 27 y.o. female coming in with complaint of back and neck pain. OMT 01/29/2023. Patient states that she is having more neck stiffness due to having to cancel her last visit.   Medications patient has been prescribed: Zanaflex  Taking:         Reviewed prior external information including notes and imaging from previsou exam, outside providers and external EMR if available.   As well as notes that were available from care everywhere and other healthcare systems.  Past medical history, social, surgical and family history all reviewed in electronic medical record.  No pertanent information unless stated regarding to the chief complaint.   Past Medical History:  Diagnosis Date   BRCA negative 09/2020   MyRisk neg except AXIN2 VUS   Family history of adverse reaction to anesthesia    heart condition   Family history of breast cancer 09/2020   IBIS=15.7%/riskscore=14.7%   Family history of pancreatic cancer    Family history of thyroid disease    GERD (gastroesophageal reflux disease)    Migraine     Allergies  Allergen Reactions   Doxycycline Other (See Comments)    Exacerbates Migraines   Imitrex [Sumatriptan] Swelling    Facial swelling and burning lips   Penicillins Hives    Did it involve swelling of the face/tongue/throat, SOB, or low BP? No Did it involve sudden or severe rash/hives, skin peeling, or any reaction on the inside of your mouth or nose? Yes Did you need to seek medical attention at a hospital or doctor's office? Yes When did it last happen?      childhood allergy If all above answers are "NO", may proceed with cephalosporin use.      Review of Systems:  No headache, visual changes, nausea, vomiting, diarrhea, constipation, dizziness, abdominal pain, skin rash, fevers, chills, night sweats, weight loss, swollen lymph nodes, body aches, joint swelling, chest pain, shortness of breath, mood changes. POSITIVE muscle aches  Objective  Blood pressure 96/62, pulse 68, height  (1.626 m), weight 163 lb (73.9 kg), SpO2 98 %.   General: No apparent distress alert and oriented x3 mood and affect normal, dressed appropriately.  HEENT: Pupils equal, extraocular movements intact  Respiratory: Patient's speak in full sentences and does not appear short of breath  Cardiovascular: No lower extremity edema, non tender, no erythema  \Low back exam does have some mild loss of lordosis.  Significant tightness noted in the parascapular area right greater than left.  Seems to be at the T7 being the worst.  Tightness noted in the T2 and going all the way up to the neck.  Limited sidebending to the left and rotation to the right  Osteopathic findings  C2 flexed rotated and side bent right C7 flexed rotated and side bent left T2 extended rotated and side bent right inhaled rib T7 extended rotated and side bent right with inhaled rib L2 flexed rotated and side bent right Sacrum right on right       Assessment and Plan:  Neck pain Still multifactorial.  Discussed icing regimen and home exercises, discussed which activities to do and which ones to avoid, increase  activity slowly. Patient does still have the thyromegaly and we can continue to monitor.  Do not think that other workup is necessary at this point.  Follow-up with me again in 6 to 8 weeks otherwise.    Nonallopathic problems  Decision today to treat with OMT was based on Physical Exam  After verbal consent patient was treated with HVLA, ME, FPR techniques in cervical, rib, thoracic, lumbar, and sacral  areas  Patient tolerated the procedure well with improvement  in symptoms  Patient given exercises, stretches and lifestyle modifications  See medications in patient instructions if given  Patient will follow up in 4-8 weeks     The above documentation has been reviewed and is accurate and complete Judi Saa, DO         Note: This dictation was prepared with Dragon dictation along with smaller phrase technology. Any transcriptional errors that result from this process are unintentional.

## 2023-04-04 ENCOUNTER — Encounter: Payer: Self-pay | Admitting: Family Medicine

## 2023-04-04 ENCOUNTER — Ambulatory Visit: Payer: Medicaid Other | Admitting: Family Medicine

## 2023-04-04 VITALS — BP 96/62 | HR 68 | Ht 64.0 in | Wt 163.0 lb

## 2023-04-04 DIAGNOSIS — M9903 Segmental and somatic dysfunction of lumbar region: Secondary | ICD-10-CM | POA: Diagnosis not present

## 2023-04-04 DIAGNOSIS — M9908 Segmental and somatic dysfunction of rib cage: Secondary | ICD-10-CM | POA: Diagnosis not present

## 2023-04-04 DIAGNOSIS — M542 Cervicalgia: Secondary | ICD-10-CM

## 2023-04-04 DIAGNOSIS — M9901 Segmental and somatic dysfunction of cervical region: Secondary | ICD-10-CM

## 2023-04-04 DIAGNOSIS — E010 Iodine-deficiency related diffuse (endemic) goiter: Secondary | ICD-10-CM

## 2023-04-04 DIAGNOSIS — M9904 Segmental and somatic dysfunction of sacral region: Secondary | ICD-10-CM

## 2023-04-04 DIAGNOSIS — M9902 Segmental and somatic dysfunction of thoracic region: Secondary | ICD-10-CM

## 2023-04-04 MED ORDER — TIZANIDINE HCL 2 MG PO TABS: 2.0000 mg | ORAL_TABLET | Freq: Every day | ORAL | 0 refills | Status: DC

## 2023-04-04 NOTE — Assessment & Plan Note (Addendum)
Still multifactorial.  Discussed icing regimen and home exercises, discussed which activities to do and which ones to avoid, increase activity slowly. Patient does still have the thyromegaly and we can continue to monitor.  Do not think that other workup is necessary at this point.  Follow-up with me again in 6 to 8 weeks otherwise.  Prescribed Zanaflex for breakthrough pain

## 2023-04-04 NOTE — Patient Instructions (Signed)
Good to see you  Stay active  Refilled zanaflex See me again in 5-6 weeks

## 2023-04-04 NOTE — Assessment & Plan Note (Signed)
[place]  Everything has been normal.  Last ultrasound was done in July 2023 and would hold until at least a year if we continue to notice any significant enlargement

## 2023-04-11 ENCOUNTER — Other Ambulatory Visit: Payer: Self-pay

## 2023-04-11 ENCOUNTER — Encounter: Payer: Self-pay | Admitting: Family Medicine

## 2023-04-11 DIAGNOSIS — M5412 Radiculopathy, cervical region: Secondary | ICD-10-CM

## 2023-04-16 ENCOUNTER — Encounter: Payer: Self-pay | Admitting: Psychiatry

## 2023-04-16 ENCOUNTER — Telehealth (INDEPENDENT_AMBULATORY_CARE_PROVIDER_SITE_OTHER): Payer: Medicaid Other | Admitting: Psychiatry

## 2023-04-16 DIAGNOSIS — F411 Generalized anxiety disorder: Secondary | ICD-10-CM | POA: Diagnosis not present

## 2023-04-16 DIAGNOSIS — G4701 Insomnia due to medical condition: Secondary | ICD-10-CM

## 2023-04-16 DIAGNOSIS — F3342 Major depressive disorder, recurrent, in full remission: Secondary | ICD-10-CM

## 2023-04-16 MED ORDER — ESCITALOPRAM OXALATE 20 MG PO TABS: 20.0000 mg | ORAL_TABLET | Freq: Every day | ORAL | 0 refills | Status: DC

## 2023-04-16 NOTE — Progress Notes (Unsigned)
Virtual Visit via Video Note  I connected with Amanda [name] on 04/16/23 at  3:30 PM EDT by a video enabled telemedicine application and verified that I am speaking with the correct person using two identifiers.  Location Provider Location : [facility] Patient Location : Home  Participants: Patient , Provider    I discussed the limitations of evaluation and management by telemedicine and the availability of in person appointments. The patient expressed understanding and agreed to proceed.   I discussed the assessment and treatment plan with the patient. The patient was provided an opportunity to ask questions and all were answered. The patient agreed with the plan and demonstrated an understanding of the instructions.   The patient was advised to call back or seek an in-person evaluation if the symptoms worsen or if the condition fails to improve as anticipated.                                                                       BH MD OP Progress Note  04/17/2023 9:59 AM Amanda [name]  MRN:  [id number]  Chief Complaint:  Chief Complaint  Patient presents with   Follow-up   Anxiety   Depression   HPI: Amanda [name] is a 27 year old female, engaged, lives in [place], self-employed, has a history of GAD, MDD was evaluated by telemedicine today.  Patient today reports she has a lot of psychosocial stressors going on at this time.  Patient reports she has been struggling with neck pain.  She reports she is currently undergoing treatment for that.  She reports that does make her anxious.  Patient also reports sleep as restless since she is unable to find a comfortable position.  She however reports she has been managing overall okay at work.  She just got back from work.  Patient also reports she got a new puppy which is around 15 weeks old.  That has been challenging since the puppy needs a lot of attention.  Her children continues to need help throughout the day .  Patient has reports she  continues to have anxiety, feels nervous, often on edge.  Patient reports she is currently taking Lexapro, denies side effects.  Agreeable to dosage increase.  Denies any suicidality, homicidality or perceptual disturbances.  Patient denies any other concerns today.  Visit Diagnosis:    ICD-10-CM   1. GAD (generalized anxiety disorder)  F41.1 escitalopram (LEXAPRO) 20 MG tablet    2. MDD (major depressive disorder), recurrent, in full remission (HCC)  F33.42 escitalopram (LEXAPRO) 20 MG tablet    3. Insomnia due to medical condition  G47.01    due to pain , children needing help      Past Psychiatric History: I have reviewed past psychiatric history from progress note on 02/02/2022.  Past trials of Zoloft-did not work, Effexor-made her suicidal.    Past Medical History:  Past Medical History:  Diagnosis Date   BRCA negative 09/2020   MyRisk neg except AXIN2 VUS   Family history of adverse reaction to anesthesia    heart condition   Family history of breast cancer 09/2020   IBIS=15.7%/riskscore=14.7%   Family history of pancreatic cancer    Family history of thyroid disease  GERD (gastroesophageal reflux disease)    Migraine     Past Surgical History:  Procedure Laterality Date   NO PAST SURGERIES     TONSILLECTOMY Bilateral 09/05/2022   Procedure: TONSILLECTOMY;  Surgeon: Geanie Logan, MD;  Location: [facility];  Service: ENT;  Laterality: Bilateral;   WISDOM TOOTH EXTRACTION      Family Psychiatric History: I have reviewed family psychiatric history from progress note on 02/02/2022.  Family History:  Family History  Problem Relation Age of Onset   Migraines Mother    Skin cancer Father    Asthma Maternal Grandmother    Hypertension Maternal Grandmother    Thyroid disease Maternal Grandmother    Hypertension Maternal Grandfather    Pancreatic cancer Maternal Grandfather 57   Colon cancer Maternal Grandfather 60   Hypertension Paternal Grandmother     Diabetes Paternal Grandmother    Hypertension Paternal Grandfather    Diabetes Paternal Grandfather    Colon cancer Paternal Grandfather 39   Thyroid disease Maternal Aunt    Breast cancer Maternal Aunt 45   Allergies Brother     Social History: I have reviewed social history from progress note on 02/02/2022. Social History   Socioeconomic History   Marital status: Single    Spouse name: Environmental manager   Number of children: 2   Years of education: Not on file   Highest education level: Associate degree: occupational, Scientist, product/process development, or vocational program  Occupational History   Occupation: Massage Therapy  Tobacco Use   Smoking status: Never    Passive exposure: Never   Smokeless tobacco: Never  Vaping Use   Vaping Use: Never used  Substance and Sexual Activity   Alcohol use: Never   Drug use: No   Sexual activity: Yes    Partners: Male    Birth control/protection: None  Other Topics Concern   Not on file  Social History Narrative   ** Merged History Encounter **       One story home Right-handed Caffeine: occasional coffee   Social Determinants of Health   Financial Resource Strain: Not on file  Food Insecurity: Not on file  Transportation Needs: Not on file  Physical Activity: Not on file  Stress: Stress Concern Present (12/01/2021)   [employer] of Occupational Health - Occupational Stress Questionnaire    Feeling of Stress : Rather much  Social Connections: Not on file    Allergies:  Allergies  Allergen Reactions   Doxycycline Other (See Comments)    Exacerbates Migraines   Imitrex [Sumatriptan] Swelling    Facial swelling and burning lips   Penicillins Hives    Did it involve swelling of the face/tongue/throat, SOB, or low BP? No Did it involve sudden or severe rash/hives, skin peeling, or any reaction on the inside of your mouth or nose? Yes Did you need to seek medical attention at a hospital or doctor's office? Yes When did it last happen?       childhood allergy If all above answers are "NO", may proceed with cephalosporin use.    Metabolic Disorder Labs: Lab Results  Component Value Date   HGBA1C 5.3 01/07/2015   No results found for: "PROLACTIN" Lab Results  Component Value Date   CHOL 135 06/28/2020   TRIG 54 06/28/2020   HDL 54 06/28/2020   LDLCALC 69 06/28/2020   Lab Results  Component Value Date   TSH 1.78 07/04/2022   TSH 1.600 03/08/2022    Therapeutic Level Labs: No results found for: "  LITHIUM" No results found for: "VALPROATE" No results found for: "CBMZ"  Current Medications: Current Outpatient Medications  Medication Sig Dispense Refill   Adapalene (DIFFERIN) 0.3 % gel Apply to face qhs, wash off qam 45 g 3   Clindamycin-Benzoyl Per, Refr, (DUAC) gel Apply to face qam, wash off qhs 45 g 3   escitalopram (LEXAPRO) 20 MG tablet Take 1 tablet (20 mg total) by mouth daily. Dose increase 90 tablet 0   hydrOXYzine (VISTARIL) 25 MG capsule TAKE 1-2 CAPSULES (25-50 MG TOTAL) BY MOUTH AT BEDTIME AS NEEDED. FOR ANXIETY AND SLEEP 180 capsule 0   minocycline (DYNACIN) 100 MG tablet Take 1 tablet (100 mg total) by mouth daily. 30 tablet 3   mupirocin ointment (BACTROBAN) 2 % Apply 1 Application topically 2 (two) times daily. 15 g 0   tiZANidine (ZANAFLEX) 2 MG tablet Take 1 tablet (2 mg total) by mouth at bedtime. 30 tablet 0   topiramate (TOPAMAX) 50 MG tablet Take 1 tablet (50 mg total) by mouth at bedtime. 30 tablet 5   triamcinolone lotion (KENALOG) 0.1 % Apply 1 Application topically 2 (two) times daily. 60 mL 0   valACYclovir (VALTREX) 1000 MG tablet Take 1 tablet (1,000 mg total) by mouth daily. 90 tablet 2   Zavegepant HCl (ZAVZPRET) 10 MG/ACT SOLN Place 1 spray into the nose daily as needed. Maximum 1 spray in 24 hours. 8 each 5   traZODone (DESYREL) 50 MG tablet TAKE 1/2 TABLET BY MOUTH AT BEDTIME AS NEEDED FOR SLEEP (Patient not taking: Reported on 04/16/2023) 45 tablet 0   No current  facility-administered medications for this visit.     Musculoskeletal: Strength & Muscle Tone:  UTA Gait & Station:  Seated Patient leans: N/A  Psychiatric Specialty Exam: Review of Systems  Musculoskeletal:  Positive for neck pain.  Psychiatric/Behavioral:  Positive for sleep disturbance. The patient is nervous/anxious.   All other systems reviewed and are negative.   There were no vitals taken for this visit.There is no height or weight on file to calculate BMI.  General Appearance: Casual  Eye Contact:  Fair  Speech:  Clear and Coherent  Volume:  Normal  Mood:  Anxious  Affect:  Congruent  Thought Process:  Goal Directed and Descriptions of Associations: Intact  Orientation:  Full (Time, Place, and Person)  Thought Content: Logical   Suicidal Thoughts:  No  Homicidal Thoughts:  No  Memory:  Immediate;   Fair Recent;   Fair Remote;   Fair  Judgement:  Fair  Insight:  Fair  Psychomotor Activity:  Normal  Concentration:  Concentration: Fair and Attention Span: Fair  Recall:  [employer] of Knowledge: Fair  Language: Fair  Akathisia:  No  Handed:  Right  AIMS (if indicated): not done  Assets:  Communication Skills Desire for Improvement Social Support  ADL's:  Intact  Cognition: WNL  Sleep:   restless due to pain   Screenings: AIMS    Flowsheet Row Video Visit from 07/21/2022 in [facility]  AIMS Total Score 0      GAD-7    Flowsheet Row Video Visit from 12/12/2022 in [facility] Video Visit from 08/29/2022 in [facility] Video Visit from 07/21/2022 in [facility] Video Visit from 06/06/2022 in [facility] Office Visit from 06/01/2022 in [facility]  Total GAD-7 Score 9 5 8  11  10      PHQ2-9    Flowsheet Row Office Visit from 12/21/2022  in [facility] Video Visit from 12/12/2022 in [facility] Counselor from 11/21/2022 in [facility] Video Visit from 08/29/2022 in [facility] Video Visit from 07/21/2022 in [facility]  PHQ-2 Total Score 2 0 0 0 1  PHQ-9 Total Score 12 -- -- -- --      Flowsheet Row Video Visit from 04/16/2023 in [facility] Counselor from 03/27/2023 in [facility] at [facility] Video Visit from 12/12/2022 in [facility]  C-SSRS RISK CATEGORY No Risk No Risk No Risk        Assessment and Plan: Amanda [name] is a 27 year old female, engaged, lives in [place], has a history of GAD, MDD was evaluated by telemedicine today.  Patient is currently struggling with anxiety, has multiple psychosocial stressors including pain, will benefit from the following plan.  Plan GAD-unstable Increase Lexapro to 20 mg p.o. daily Continue CBT as needed, used to follow-up with Ms. [name] Hussami  MDD in remission Lexapro was prescribed  Insomnia-restless due to pain She will need sufficient pain management Trazodone 25 mg at bedtime as needed for sleep  Follow-up in clinic in 2 months or sooner in person.   Consent: Patient/Guardian gives verbal consent for treatment and assignment of benefits for services provided during this visit. Patient/Guardian expressed understanding and agreed to proceed.   This note was generated in part or whole with voice recognition software. Voice recognition is usually quite accurate but there are transcription errors that can and very often do occur. I apologize for any typographical errors that were not detected and corrected.    Jomarie Longs, MD 04/17/2023, 9:59 AM

## 2023-04-18 ENCOUNTER — Ambulatory Visit: Payer: Medicaid Other | Admitting: Neurology

## 2023-04-19 NOTE — Progress Notes (Signed)
NEUROLOGY FOLLOW UP OFFICE NOTE  Amanda [name] [id number]  Assessment/Plan:   1  Migraine without aura, without status migrainosus, intractable - cervicogenic 2  Migraine with aura - one episode   1.Migraine prevention:  Increase topiramate to 75mg  at bedtime.   2.Migraine rescue:  Zavzpret 3.May use Tylenol.  Limit use of pain relievers to no more than 2 days out of week to prevent risk of rebound or medication-overuse headache. 4.Keep headache diary 5.Continue OMM/neck management with Dr. Katrinka Blazing 6.Follow up 6 months.   Subjective:  Amanda [name] is a 27 year old right-handed female who follows up for migraines.   UPDATE: Increased topiramate last visit.  Noted improvement.  Migraines were mild, lasting 15-20 minutes with Zavzpret and occurring 2-3 days a month.  However, she reports increased neck pain which has aggravated her migraines.  In the last 30 days, migraines are more severe and have occurred 5-6 times.  Each lasted 2-3 days but she hadn't been using the Zavzpret.  She followed up with Dr. Katrinka Blazing who ordered a cervical spinal epidural.       No more migraines with aura.     Current NSAIDS/analgesics:  none Current triptans:  none Current ergotamine:  none Current anti-emetic:  none Current muscle relaxants:  none Current Antihypertensive medications:  none Current Antidepressant medications:  Lexapro 5g Current Anticonvulsant medications:  topiramate 50mg  QHS Current anti-CGRP:  Ubrelvy 100mg  Current Vitamins/Herbal/Supplements:  none Current Antihistamines/Decongestants:  Zyrtec Other therapy:  OMM for neck pain Hormone/birth control:  none Anti-anxiety:  Hydroxyzine Sleep:  trazodone 25mg  QHS   Caffeine:  No coffee.  Sometimes a Red Bull Diet:  Hydrates with water.  Tries not to skip meals.  Occasional soda Exercise:  Walks routine Depression:  improved; Anxiety:  no Other pain:  no Sleep hygiene:  At least 6-7 hours   HISTORY:  Migraines since teenager.   They are bifrontal/facial pressure with photophobia, phonophobia, dizziness and sometimes nausea.  In February 2018, she had a migraine where she lost vision in the left eye.  MRI of brain with and without contrast on 01/29/2017 personally reviewed was normal.  They would last 4-5 hours and occur every 2 to 3 days.. Every 2 to 3 days, not as severe, 4 to 5 hours.  Improved during pregnancy in 2020 but became worse following birth of her twins.  Since late 2020, they are daily, lasting 4-5 hours (but within 30 minutes with Nurtec).  They are moderate in the morning and gradually become more severe later in the day with worsening dizziness.  One migraine was associated with visual aura (she couldn't seen out of her left eye and saw stars out of her right eye) but typically without aura.  Triggers include smells (perfumes, cleaning fluids), skipped meals, computer screen time, warm orange light.  She takes Tylenol once a day to every other day.       Past NSAIDS/analgesics:  Ibuprofen (contraindicated - stomach), Excedrin, acetaminophen Past abortive triptans:  Sumatriptan (lip and tongue swelling), rizatriptan (drowsiness) Past abortive ergotamine:  none Past muscle relaxants:  none Past anti-emetic:  Zofran 4mg  Past antihypertensive medications:  none Past antidepressant medications:  Amitriptyline (drowsiness), sertraline Past anticonvulsant medications:  gabapentin Past anti-CGRP:  Emgality (painful), Nurtec (ineffective) Past vitamins/Herbal/Supplements:  none Past antihistamines/decongestants:  none     Family history of headache:  Mom (chronic migraines)  PAST MEDICAL HISTORY: Past Medical History:  Diagnosis Date   BRCA negative 09/2020   MyRisk neg except  AXIN2 VUS   Family history of adverse reaction to anesthesia    heart condition   Family history of breast cancer 09/2020   IBIS=15.7%/riskscore=14.7%   Family history of pancreatic cancer    Family history of thyroid disease     GERD (gastroesophageal reflux disease)    Migraine     MEDICATIONS: Current Outpatient Medications on File Prior to Visit  Medication Sig Dispense Refill   Adapalene (DIFFERIN) 0.3 % gel Apply to face qhs, wash off qam 45 g 3   Clindamycin-Benzoyl Per, Refr, (DUAC) gel Apply to face qam, wash off qhs 45 g 3   escitalopram (LEXAPRO) 20 MG tablet Take 1 tablet (20 mg total) by mouth daily. Dose increase 90 tablet 0   hydrOXYzine (VISTARIL) 25 MG capsule TAKE 1-2 CAPSULES (25-50 MG TOTAL) BY MOUTH AT BEDTIME AS NEEDED. FOR ANXIETY AND SLEEP 180 capsule 0   minocycline (DYNACIN) 100 MG tablet Take 1 tablet (100 mg total) by mouth daily. 30 tablet 3   mupirocin ointment (BACTROBAN) 2 % Apply 1 Application topically 2 (two) times daily. 15 g 0   tiZANidine (ZANAFLEX) 2 MG tablet Take 1 tablet (2 mg total) by mouth at bedtime. 30 tablet 0   topiramate (TOPAMAX) 50 MG tablet Take 1 tablet (50 mg total) by mouth at bedtime. 30 tablet 5   traZODone (DESYREL) 50 MG tablet TAKE 1/2 TABLET BY MOUTH AT BEDTIME AS NEEDED FOR SLEEP (Patient not taking: Reported on 04/16/2023) 45 tablet 0   triamcinolone lotion (KENALOG) 0.1 % Apply 1 Application topically 2 (two) times daily. 60 mL 0   valACYclovir (VALTREX) 1000 MG tablet Take 1 tablet (1,000 mg total) by mouth daily. 90 tablet 2   Zavegepant HCl (ZAVZPRET) 10 MG/ACT SOLN Place 1 spray into the nose daily as needed. Maximum 1 spray in 24 hours. 8 each 5   [DISCONTINUED] dicyclomine (BENTYL) 20 MG tablet Take 1 tablet (20 mg total) by mouth 2 (two) times daily. (Patient not taking: No sig reported) 20 tablet 0   [DISCONTINUED] norethindrone (MICRONOR) 0.35 MG tablet Take 1 tablet (0.35 mg total) by mouth daily. (Patient not taking: No sig reported) 30 tablet 6   [DISCONTINUED] omeprazole (PRILOSEC) 40 MG capsule Take 1 capsule (40 mg total) by mouth in the morning and at bedtime. (Patient not taking: Reported on 01/04/2021) 60 capsule 2   No current  facility-administered medications on file prior to visit.    ALLERGIES: Allergies  Allergen Reactions   Doxycycline Other (See Comments)    Exacerbates Migraines   Imitrex [Sumatriptan] Swelling    Facial swelling and burning lips   Penicillins Hives    Did it involve swelling of the face/tongue/throat, SOB, or low BP? No Did it involve sudden or severe rash/hives, skin peeling, or any reaction on the inside of your mouth or nose? Yes Did you need to seek medical attention at a hospital or doctor's office? Yes When did it last happen?      childhood allergy If all above answers are "NO", may proceed with cephalosporin use.    FAMILY HISTORY: Family History  Problem Relation Age of Onset   Migraines Mother    Skin cancer Father    Asthma Maternal Grandmother    Hypertension Maternal Grandmother    Thyroid disease Maternal Grandmother    Hypertension Maternal Grandfather    Pancreatic cancer Maternal Grandfather 23   Colon cancer Maternal Grandfather 60   Hypertension Paternal Grandmother    Diabetes Paternal  Grandmother    Hypertension Paternal Grandfather    Diabetes Paternal Grandfather    Colon cancer Paternal Grandfather 73   Thyroid disease Maternal Aunt    Breast cancer Maternal Aunt 45   Allergies Brother       Objective:  Blood pressure 101/62, pulse 68, height 5\' 4"  (1.626 m), weight 160 lb (72.6 kg), SpO2 98 %. General: No acute distress.  Patient appears well-groomed.   Head:  Normocephalic/atraumatic Eyes:  Fundi examined but not visualized Neck: supple, bilateral paraspinal tenderness, full range of motion Heart:  Regular rate and rhythm Lungs:  Clear to auscultation bilaterally Back: No paraspinal tenderness Neurological Exam: alert and oriented to person, place, and time.  Speech fluent and not dysarthric, language intact.  CN II-XII intact. Bulk and tone normal, muscle strength 5/5 throughout.  Sensation to light touch intact.  Deep tendon reflexes 2+  throughout, toes downgoing.  Finger to nose testing intact.  Gait normal, Romberg negative.   Shon Millet, DO  CC: Alfredia Ferguson, PA-C

## 2023-04-20 ENCOUNTER — Encounter: Payer: Self-pay | Admitting: Neurology

## 2023-04-20 ENCOUNTER — Ambulatory Visit: Payer: Medicaid Other | Admitting: Neurology

## 2023-04-20 VITALS — BP 101/62 | HR 68 | Ht 64.0 in | Wt 160.0 lb

## 2023-04-20 DIAGNOSIS — G43009 Migraine without aura, not intractable, without status migrainosus: Secondary | ICD-10-CM | POA: Diagnosis not present

## 2023-04-20 DIAGNOSIS — M542 Cervicalgia: Secondary | ICD-10-CM

## 2023-04-20 MED ORDER — TOPIRAMATE 25 MG PO TABS: 75.0000 mg | ORAL_TABLET | Freq: Every day | ORAL | 5 refills | Status: DC

## 2023-04-20 NOTE — Patient Instructions (Signed)
INCREASE TOPIRAMATE TO 75MG  AT BEDTIME.  IF NO IMPROVEMENT IN 4 WEEKS, CONTACT ME ZAVZPRET SPRAY AS NEEDED. MAY USE TYLENOL OR IBUPROFEN IF NEEDED, BUT LIMIT TO NO MORE THAN 2 DAYS OUT OF WEEK HEADACHE DIARY FOLLOW UP WITH DR. Katrinka Blazing REGARDING MANAGEMENT OF NECK PAIN FOLLOW UP 6 MONTHS

## 2023-04-27 ENCOUNTER — Other Ambulatory Visit: Payer: Medicaid Other

## 2023-05-01 ENCOUNTER — Ambulatory Visit: Payer: Medicaid Other | Admitting: Dermatology

## 2023-05-01 ENCOUNTER — Inpatient Hospital Stay: Admission: RE | Admit: 2023-05-01 | Payer: Medicaid Other | Source: Ambulatory Visit

## 2023-05-02 ENCOUNTER — Inpatient Hospital Stay: Admission: RE | Admit: 2023-05-02 | Payer: Medicaid Other | Source: Ambulatory Visit

## 2023-05-04 ENCOUNTER — Ambulatory Visit: Payer: Medicaid Other | Admitting: Internal Medicine

## 2023-05-08 ENCOUNTER — Ambulatory Visit
Admission: RE | Admit: 2023-05-08 | Discharge: 2023-05-08 | Disposition: A | Discharge status: Home / Self Care | Payer: Medicaid Other | Source: Ambulatory Visit | Attending: Family Medicine | Admitting: Family Medicine

## 2023-05-08 ENCOUNTER — Ambulatory Visit (INDEPENDENT_AMBULATORY_CARE_PROVIDER_SITE_OTHER): Payer: Medicaid Other | Admitting: Licensed Clinical Social Worker

## 2023-05-08 DIAGNOSIS — F3342 Major depressive disorder, recurrent, in full remission: Secondary | ICD-10-CM

## 2023-05-08 DIAGNOSIS — F411 Generalized anxiety disorder: Secondary | ICD-10-CM

## 2023-05-08 DIAGNOSIS — M5412 Radiculopathy, cervical region: Secondary | ICD-10-CM

## 2023-05-08 MED ORDER — TRIAMCINOLONE ACETONIDE 40 MG/ML IJ SUSP (RADIOLOGY)
60.0000 mg | Freq: Once | INTRAMUSCULAR | Status: AC
  Administered 2023-05-08: 60 mg via EPIDURAL

## 2023-05-08 MED ORDER — IOPAMIDOL (ISOVUE-M 300) INJECTION 61%
1.0000 mL | Freq: Once | INTRAMUSCULAR | Status: AC | PRN
  Administered 2023-05-08: 1 mL via EPIDURAL

## 2023-05-08 NOTE — Progress Notes (Unsigned)
Virtual Visit via Video Note  I connected with Amanda [name] on 05/08/23 at  8:00 AM EDT by a video enabled telemedicine application and verified that I am speaking with the correct person using two identifiers.  Location: Patient: home Provider: remote office [place], [place])   I discussed the limitations of evaluation and management by telemedicine and the availability of in person appointments. The patient expressed understanding and agreed to proceed.  I discussed the assessment and treatment plan with the patient. The patient was provided an opportunity to ask questions and all were answered. The patient agreed with the plan and demonstrated an understanding of the instructions.   The patient was advised to call back or seek an in-person evaluation if the symptoms worsen or if the condition fails to improve as anticipated.  I provided 60 minutes of non-face-to-face time during this encounter.   Amanda [name] R Gursimran Litaker, LCSW   THERAPIST PROGRESS NOTE  Session Time: 8-9a  Participation Level: Active  Behavioral Response: NeatAlertAnxious  Type of Therapy: Individual Therapy  Treatment Goals addressed: LTG: Reduce overall frequency, intensity, and duration of the anxiety so that daily functioning is not impaired per pt self report 3 out of 5 sessions documented.    STG: Learn and implement coping skills that result in a reduction of anxiety and worry, and improve daily functioning per pt report 3 out of 5 sessions documented    ProgressTowards Goals: Progressing  Interventions: CBT  Summary: Amanda [name] is a 27 y.o. female who presents with continuing symptoms related to anxiety.  Patient reports that she is compliant with her medication (Lexapro)  and is compliant with her psychiatric appointments.  Patient reports fair quality and quantity of sleep.    Clinician assisted pt with identifying current levels of chronic pain, and explored medical interventions for treating  chronic pain. Discussed overall impact of pain on daily activities, relationships, and ability/inability to engage in self care or recreational activities. Reviewed keeping balance between rest and activity, and to continue communicating with medical providers for pain management. Reviewed mindfulness and meditation as relaxation interventions.  Assisted pt with identifying stress associated with caring for their children and young family members (keeping niece through the summer). Pt admits that she often feels overwhelmed with parenting responsibilities and has little to no respite/self care time. Provided pt with positive support and encouragement as pt explored current stressors. Pt reports that she recently acquired a new puppy--which is also a lot of extra work for her--pt currently has a dog with diabetes that .  Reviewed stress management strategies with [name].. Pt reflects understanding and willingness to cooperate and make positive changes   Allowed pt to explore recent financial stressors. Discussed ways that pt has tried to improve situation and utilize resources available to them.   Continued recommendations are as follows: self care behaviors, positive social engagements, focusing on overall work/home/life balance, and focusing on positive physical and emotional wellness.   Suicidal/Homicidal: No  Therapist Response: Pt is continuing to apply interventions learned in session into daily life situations. Pt is currently on track to meet goals utilizing interventions mentioned above. Personal growth and progress noted. Treatment to continue as indicated.   Encouraged patient to continue engaging in positive behavioral activities, and encouraged patient to challenge negative thoughts.  Assisted patient in exploring the benefits and consequences of going back to work versus staying home.  Plan: Return again in 4 weeks.  Diagnosis:  Encounter Diagnoses  Name Primary?   GAD (  generalized  anxiety disorder) Yes   MDD (major depressive disorder), recurrent, in full remission (HCC)     Collaboration of Care: Other pt encouraged to continue with psychiatrist of record, Dr. Jomarie Longs  Patient/Guardian was advised Release of Information must be obtained prior to any record release in order to collaborate their care with an outside provider. Patient/Guardian was advised if they have not already done so to contact the registration department to sign all necessary forms in order for [place] to release information regarding their care.   Consent: Patient/Guardian gives verbal consent for treatment and assignment of benefits for services provided during this visit. Patient/Guardian expressed understanding and agreed to proceed.   Ernest Haber Jago Carton, LCSW 05/08/2023

## 2023-05-08 NOTE — Discharge Instructions (Signed)

## 2023-05-10 NOTE — Progress Notes (Deleted)
[facility] [address] [place] [address] Phone: [phone] Subjective:    I'm seeing this patient by the request  of:  Alfredia Ferguson, PA-C  CC: Back and neck pain follow-up  [id number]  Amanda [name] is a 27 y.o. female coming in with complaint of back and neck pain. OMT on 04/04/2023. Patient states   Medications patient has been prescribed: Zanaflex  Taking:         Reviewed prior external information including notes and imaging from previsou exam, outside providers and external EMR if available.   As well as notes that were available from care everywhere and other healthcare systems.  Since we have seen patient patient has followed up with behavioral health and neurology recently.  Was to increase Topamax to 75 mg at bedtime.  Past medical history, social, surgical and family history all reviewed in electronic medical record.  No pertanent information unless stated regarding to the chief complaint.   Past Medical History:  Diagnosis Date   BRCA negative 09/2020   MyRisk neg except AXIN2 VUS   Family history of adverse reaction to anesthesia    heart condition   Family history of breast cancer 09/2020   IBIS=15.7%/riskscore=14.7%   Family history of pancreatic cancer    Family history of thyroid disease    GERD (gastroesophageal reflux disease)    Migraine     Allergies  Allergen Reactions   Doxycycline Other (See Comments)    Exacerbates Migraines   Imitrex [Sumatriptan] Swelling    Facial swelling and burning lips   Penicillins Hives    Did it involve swelling of the face/tongue/throat, SOB, or low BP? No Did it involve sudden or severe rash/hives, skin peeling, or any reaction on the inside of your mouth or nose? Yes Did you need to seek medical attention at a hospital or doctor's office? Yes When did it last happen?      childhood allergy If all above answers are "NO", may proceed with cephalosporin use.      Review of Systems:  No headache, visual changes, nausea, vomiting, diarrhea, constipation, dizziness, abdominal pain, skin rash, fevers, chills, night sweats, weight loss, swollen lymph nodes, body aches, joint swelling, chest pain, shortness of breath, mood changes. POSITIVE muscle aches  Objective  There were no vitals taken for this visit.   General: No apparent distress alert and oriented x3 mood and affect normal, dressed appropriately.  HEENT: Pupils equal, extraocular movements intact  Respiratory: Patient's speak in full sentences and does not appear short of breath  Cardiovascular: No lower extremity edema, non tender, no erythema  Gait MSK:  Back   Osteopathic findings  C2 flexed rotated and side bent right C6 flexed rotated and side bent left T3 extended rotated and side bent right inhaled rib T9 extended rotated and side bent left L2 flexed rotated and side bent right Sacrum right on right       Assessment and Plan:  No problem-specific Assessment & Plan notes found for this encounter.    Nonallopathic problems  Decision today to treat with OMT was based on Physical Exam  After verbal consent patient was treated with HVLA, ME, FPR techniques in cervical, rib, thoracic, lumbar, and sacral  areas  Patient tolerated the procedure well with improvement in symptoms  Patient given exercises, stretches and lifestyle modifications  See medications in patient instructions if given  Patient will follow up in 4-8 weeks    The above documentation  has been reviewed and is accurate and complete Judi Saa, DO          Note: This dictation was prepared with Dragon dictation along with smaller phrase technology. Any transcriptional errors that result from this process are unintentional.

## 2023-05-15 ENCOUNTER — Ambulatory Visit: Payer: Medicaid Other | Admitting: Family Medicine

## 2023-05-16 NOTE — Progress Notes (Unsigned)
[name] [address] [place] [address] Phone: [phone] Subjective:    I'm seeing this patient by the request  of:  Alfredia Ferguson, PA-C  CC: Neck exam and neck pain follow-up, acute left hip pain  [id number]   OMT 04/04/2023. Patient states that she is pretty good since epidural maybe last Tuesday, has been feeling significantly better at this time.  Has not had a headache since she has had the epidural.  Medications patient has been prescribed:   Taking:         Reviewed prior external information including notes and imaging from previsou exam, outside providers and external EMR if available.   As well as notes that were available from care everywhere and other healthcare systems.  Past medical history, social, surgical and family history all reviewed in electronic medical record.  No pertanent information unless stated regarding to the chief complaint.   Past Medical History:  Diagnosis Date   BRCA negative 09/2020   MyRisk neg except AXIN2 VUS   Family history of adverse reaction to anesthesia    heart condition   Family history of breast cancer 09/2020   IBIS=15.7%/riskscore=14.7%   Family history of pancreatic cancer    Family history of thyroid disease    GERD (gastroesophageal reflux disease)    Migraine     Allergies  Allergen Reactions   Doxycycline Other (See Comments)    Exacerbates Migraines   Imitrex [Sumatriptan] Swelling    Facial swelling and burning lips   Penicillins Hives    Did it involve swelling of the face/tongue/throat, SOB, or low BP? No Did it involve sudden or severe rash/hives, skin peeling, or any reaction on the inside of your mouth or nose? Yes Did you need to seek medical attention at a hospital or doctor's office? Yes When did it last happen?      childhood allergy If all above answers are "NO", may proceed with cephalosporin use.     Review of Systems:  No headache, visual  changes, nausea, vomiting, diarrhea, constipation, dizziness, abdominal pain, skin rash, fevers, chills, night sweats, weight loss, swollen lymph nodes, body aches, joint swelling, chest pain, shortness of breath, mood changes. POSITIVE muscle aches  Objective  Blood pressure 120/80, pulse 97, height 5\' 4"  (1.626 m), weight 158 lb (71.7 kg), SpO2 98 %.   General: No apparent distress alert and oriented x3 mood and affect normal, dressed appropriately.  HEENT: Pupils equal, extraocular movements intact  Respiratory: Patient's speak in full sentences and does not appear short of breath  Cardiovascular: No lower extremity edema, non tender, no erythema  Significant tightness of the hip flexor noted.  This is new from previous exams.  Neck exam is significantly better now than previous exam with good range of motion.  Negative straight leg test to the lower back.  Neurovascular intact distally.  Osteopathic findings  T5 extended rotated and side bent right inhaled rib T7 extended rotated and side bent left L2 flexed rotated and side bent right Sacrum right on right       Assessment and Plan:  Neck muscle spasm Significant improvement after the epidural.  Avoided any manipulation of the cervical spine today.  Did respond very well to the lower back and neck tightness was noted in the hip flexor.  We discussed icing regimen and home exercises.  Not having any headaches at this time.  Discussed core strengthening.  Follow-up again in 6 to 8 weeks otherwise  Cervicogenic headache Improvement after the epidural.  Continue with the conservative therapy.  Follow-up again in 6 to 8 weeks  Hip flexor tendon tightness, left Tightness of the hip flexor that is a new problem.  Discussed icing regimen and home exercises, discussed which activities to do and which ones to avoid.  Patient was doing a lot of repetitive flexion activities that likely contributed to this.  Follow-up with me again in 8 weeks  Zanaflex for breakthrough pain    Nonallopathic problems  Decision today to treat with OMT was based on Physical Exam  After verbal consent patient was treated with HVLA, ME, FPR techniques in rib, thoracic, lumbar, and sacral  areas  Patient tolerated the procedure well with improvement in symptoms  Patient given exercises, stretches and lifestyle modifications  See medications in patient instructions if given  Patient will follow up in 4-8 weeks     The above documentation has been reviewed and is accurate and complete Judi Saa, DO         Note: This dictation was prepared with Dragon dictation along with smaller phrase technology. Any transcriptional errors that result from this process are unintentional.

## 2023-05-17 ENCOUNTER — Ambulatory Visit: Payer: Medicaid Other | Admitting: Family Medicine

## 2023-05-17 ENCOUNTER — Encounter: Payer: Self-pay | Admitting: Family Medicine

## 2023-05-17 ENCOUNTER — Telehealth: Payer: Self-pay | Admitting: Pharmacy Technician

## 2023-05-17 VITALS — BP 120/80 | HR 97 | Ht 64.0 in | Wt 158.0 lb

## 2023-05-17 DIAGNOSIS — M9904 Segmental and somatic dysfunction of sacral region: Secondary | ICD-10-CM | POA: Diagnosis not present

## 2023-05-17 DIAGNOSIS — M24552 Contracture, left hip: Secondary | ICD-10-CM | POA: Diagnosis not present

## 2023-05-17 DIAGNOSIS — M9902 Segmental and somatic dysfunction of thoracic region: Secondary | ICD-10-CM

## 2023-05-17 DIAGNOSIS — M9903 Segmental and somatic dysfunction of lumbar region: Secondary | ICD-10-CM

## 2023-05-17 DIAGNOSIS — M62838 Other muscle spasm: Secondary | ICD-10-CM

## 2023-05-17 DIAGNOSIS — G4486 Cervicogenic headache: Secondary | ICD-10-CM | POA: Diagnosis not present

## 2023-05-17 DIAGNOSIS — M9908 Segmental and somatic dysfunction of rib cage: Secondary | ICD-10-CM

## 2023-05-17 NOTE — Patient Instructions (Signed)
Good to see you  Enjoy the pool  Stretch the hip flexor  2 month follow up

## 2023-05-17 NOTE — Assessment & Plan Note (Signed)
Improvement after the epidural.  Continue with the conservative therapy.  Follow-up again in 6 to 8 weeks

## 2023-05-17 NOTE — Telephone Encounter (Signed)
Patient Advocate Encounter   Received notification that prior authorization for Ubrelvy 100MG  tablets is required.   PA submitted on 05/17/2023 Key [id number] Insurance [facility] Medicaid of [facility] Electronic Prior Authorization Request Form Status is pending

## 2023-05-17 NOTE — Assessment & Plan Note (Addendum)
Tightness of the hip flexor that is a new problem.  Discussed icing regimen and home exercises, discussed which activities to do and which ones to avoid.  Patient was doing a lot of repetitive flexion activities that likely contributed to this.  Follow-up with me again in 8 weeks Zanaflex for breakthrough pain

## 2023-05-17 NOTE — Assessment & Plan Note (Signed)
Significant improvement after the epidural.  Avoided any manipulation of the cervical spine today.  Did respond very well to the lower back and neck tightness was noted in the hip flexor.  We discussed icing regimen and home exercises.  Not having any headaches at this time.  Discussed core strengthening.  Follow-up again in 6 to 8 weeks otherwise

## 2023-05-23 NOTE — Telephone Encounter (Signed)
Patient Advocate Encounter  Prior Authorization for Bernita Raisin 100MG  tablets has been approved through [facility].    Key: [id number]  Effective: 05-03-2023 to 05-16-2024

## 2023-06-01 ENCOUNTER — Encounter (INDEPENDENT_AMBULATORY_CARE_PROVIDER_SITE_OTHER): Payer: Medicaid Other | Admitting: Physician Assistant

## 2023-06-01 DIAGNOSIS — N939 Abnormal uterine and vaginal bleeding, unspecified: Secondary | ICD-10-CM

## 2023-06-04 ENCOUNTER — Encounter: Payer: Self-pay | Admitting: Licensed Practical Nurse

## 2023-06-04 NOTE — Progress Notes (Unsigned)
     I,Dylanie Quesenberry,acting as a Neurosurgeon for [employer], PA-C.,have documented all relevant documentation on the behalf of Alfredia Ferguson, PA-C,as directed by  Alfredia Ferguson, PA-C while in the presence of Alfredia Ferguson, PA-C.   Established patient visit   Patient: Amanda [name]   DOB: 10/14/1996   27 y.o. Female  MRN: [id number] Visit Date: 06/05/2023  Today's healthcare provider: Alfredia Ferguson, PA-C   No chief complaint on file.  Subjective    HPI  ***  Medications: Outpatient Medications Prior to Visit  Medication Sig   Adapalene (DIFFERIN) 0.3 % gel Apply to face qhs, wash off qam   Clindamycin-Benzoyl Per, Refr, (DUAC) gel Apply to face qam, wash off qhs   escitalopram (LEXAPRO) 20 MG tablet Take 1 tablet (20 mg total) by mouth daily. Dose increase   hydrOXYzine (VISTARIL) 25 MG capsule TAKE 1-2 CAPSULES (25-50 MG TOTAL) BY MOUTH AT BEDTIME AS NEEDED. FOR ANXIETY AND SLEEP   minocycline (DYNACIN) 100 MG tablet Take 1 tablet (100 mg total) by mouth daily.   mupirocin ointment (BACTROBAN) 2 % Apply 1 Application topically 2 (two) times daily.   tiZANidine (ZANAFLEX) 2 MG tablet Take 1 tablet (2 mg total) by mouth at bedtime.   topiramate (TOPAMAX) 25 MG tablet Take 3 tablets (75 mg total) by mouth at bedtime.   traZODone (DESYREL) 50 MG tablet TAKE 1/2 TABLET BY MOUTH AT BEDTIME AS NEEDED FOR SLEEP   triamcinolone lotion (KENALOG) 0.1 % Apply 1 Application topically 2 (two) times daily.   valACYclovir (VALTREX) 1000 MG tablet Take 1 tablet (1,000 mg total) by mouth daily.   Zavegepant HCl (ZAVZPRET) 10 MG/ACT SOLN Place 1 spray into the nose daily as needed. Maximum 1 spray in 24 hours.   No facility-administered medications prior to visit.    Review of Systems  {Labs  Heme  Chem  Endocrine  Serology  Results Review (optional):23779}   Objective    There were no vitals taken for this visit. {Show previous Sydell Prowell signs (optional):23777}  Physical Exam   ***  No results found for any visits on 06/05/23.  Assessment & Plan     ***  No follow-ups on file.      {provider attestation***:1}   Alfredia Ferguson, PA-C  [facility] [phone] (phone) [phone] (fax)  [facility]

## 2023-06-04 NOTE — Telephone Encounter (Signed)

## 2023-06-05 ENCOUNTER — Encounter: Payer: Self-pay | Admitting: Physician Assistant

## 2023-06-05 ENCOUNTER — Ambulatory Visit (INDEPENDENT_AMBULATORY_CARE_PROVIDER_SITE_OTHER): Payer: Medicaid Other | Admitting: Physician Assistant

## 2023-06-05 VITALS — BP 103/72 | HR 87 | Temp 98.4°F | Resp 14 | Ht 64.0 in | Wt 155.8 lb

## 2023-06-05 DIAGNOSIS — N939 Abnormal uterine and vaginal bleeding, unspecified: Secondary | ICD-10-CM | POA: Diagnosis not present

## 2023-06-05 DIAGNOSIS — R1030 Lower abdominal pain, unspecified: Secondary | ICD-10-CM

## 2023-06-05 LAB — POCT URINALYSIS DIPSTICK
Bilirubin, UA: NEGATIVE
Glucose, UA: NEGATIVE
Ketones, UA: NEGATIVE
Leukocytes, UA: NEGATIVE
Nitrite, UA: NEGATIVE
Protein, UA: NEGATIVE
Spec Grav, UA: 1.015 (ref 1.010–1.025)
Urobilinogen, UA: 0.2 E.U./dL
pH, UA: 6 (ref 5.0–8.0)

## 2023-06-05 LAB — POCT URINE PREGNANCY: Preg Test, Ur: NEGATIVE

## 2023-06-05 NOTE — Telephone Encounter (Signed)
I contacted the patient via phone. I left voicemail for the patient to call back to be schedule.

## 2023-06-06 ENCOUNTER — Ambulatory Visit: Payer: Medicaid Other | Admitting: Licensed Practical Nurse

## 2023-06-07 ENCOUNTER — Ambulatory Visit (INDEPENDENT_AMBULATORY_CARE_PROVIDER_SITE_OTHER): Payer: Medicaid Other | Admitting: Allergy & Immunology

## 2023-06-07 ENCOUNTER — Other Ambulatory Visit: Payer: Self-pay | Admitting: Physician Assistant

## 2023-06-07 ENCOUNTER — Other Ambulatory Visit: Payer: Self-pay

## 2023-06-07 ENCOUNTER — Encounter: Payer: Self-pay | Admitting: Allergy & Immunology

## 2023-06-07 VITALS — BP 122/78 | HR 102 | Temp 99.4°F | Resp 18 | Ht 63.78 in | Wt 157.2 lb

## 2023-06-07 DIAGNOSIS — N939 Abnormal uterine and vaginal bleeding, unspecified: Secondary | ICD-10-CM

## 2023-06-07 DIAGNOSIS — J302 Other seasonal allergic rhinitis: Secondary | ICD-10-CM

## 2023-06-07 DIAGNOSIS — R1030 Lower abdominal pain, unspecified: Secondary | ICD-10-CM

## 2023-06-07 DIAGNOSIS — J310 Chronic rhinitis: Secondary | ICD-10-CM

## 2023-06-07 DIAGNOSIS — J3089 Other allergic rhinitis: Secondary | ICD-10-CM | POA: Diagnosis not present

## 2023-06-07 DIAGNOSIS — L508 Other urticaria: Secondary | ICD-10-CM

## 2023-06-07 LAB — NUSWAB VAGINITIS PLUS (VG+)
Candida albicans, NAA: NEGATIVE
Candida glabrata, NAA: NEGATIVE
Chlamydia trachomatis, NAA: NEGATIVE
Neisseria gonorrhoeae, NAA: NEGATIVE
Trich vag by NAA: NEGATIVE

## 2023-06-07 MED ORDER — FAMOTIDINE 20 MG PO TABS
20.0000 mg | ORAL_TABLET | Freq: Two times a day (BID) | ORAL | 5 refills | Status: DC
Start: 2023-06-07 — End: 2023-08-02

## 2023-06-07 MED ORDER — LEVOCETIRIZINE DIHYDROCHLORIDE 5 MG PO TABS
5.0000 mg | ORAL_TABLET | Freq: Every evening | ORAL | 5 refills | Status: DC
Start: 2023-06-07 — End: 2023-06-09

## 2023-06-07 NOTE — Progress Notes (Signed)
NEW PATIENT  Date of Service/Encounter:  06/07/23  Consult requested by: Alfredia Ferguson, PA-C   Assessment:   Chronic urticaria  Seasonal and perennial allergic rhinitis (weeds, trees, indoor molds, outdoor molds, dust mites, cat, cockroach, and horse)  Plan/Recommendations:   1. Chronic urticaria - Your history does not have any "red flags" such as fevers, joint pains, or permanent skin changes that would be concerning for a more serious cause of hives.  - We will get some labs to rule out serious causes of hives:  - Alpha-Gal Panel - Chronic Urticaria - CMP14+EGFR - Tryptase - Thyroid antibodies - CBC With Differential - Chronic hives are often times a self limited process and will "burn themselves out" over 6-12 months, although this is not always the case.  - In the meantime, start suppressive dosing of antihistamines:   - Morning: Xyzal (levocetirizine) 5 mg + Pepcid (famotidine) 20mg   - Evening: Xyzal (levocetirizine) 5 mg + Pepcid (famotidine) 20mg  - You can change this dosing at home, decreasing the dose as needed or increasing the dosing as needed.  - If you are not tolerating the medications or are tired of taking them every day, we can start treatment with a monthly injectable medication called Xolair.   2. Chronic rhinitis - Testing today showed: weeds, trees, indoor molds, outdoor molds, dust mites, cat, cockroach, and horse - Copy of test results provided.  - Avoidance measures provided. - Start taking: antihistamines as above - You can use an extra dose of the antihistamine, if needed, for breakthrough symptoms.  - Consider nasal saline rinses 1-2 times daily to remove allergens from the nasal cavities as well as help with mucous clearance (this is especially helpful to do before the nasal sprays are given) - We could consider allergy shots as a means of long-term control. - Allergy shots "re-train" and "reset" the immune system to ignore environmental  allergens and decrease the resulting immune response to those allergens (sneezing, itchy watery eyes, runny nose, nasal congestion, etc).    - Allergy shots improve symptoms in 75-85% of patients.  - We can discuss more at the next appointment if the medications are not working for you.  3. Return in about 2 months (around 08/07/2023). You can have the follow up appointment with Dr. Dellis Anes or a Nurse Practicioner (our Nurse Practitioners are excellent and always have Physician oversight!).    This note in its entirety was forwarded to the Provider who requested this consultation.  Subjective:   Amanda [name] is a 27 y.o. female presenting today for evaluation of  Chief Complaint  Patient presents with   Urticaria    Hives all over her body - 6 months no known food allergies     Amanda [name] has a history of the following: Patient Active Problem List   Diagnosis Date Noted   Hip flexor tendon tightness, left 05/17/2023   Raynaud's syndrome 12/15/2022   Insomnia 07/21/2022   Neck pain 07/10/2022   ANA positive 07/10/2022   Hot flashes 07/10/2022   Sinusitis 07/04/2022   Thyromegaly 07/04/2022   Adenoid vegetations 06/21/2022   MDD (major depressive disorder), recurrent, in full remission (HCC) 06/06/2022   Abscess of groin, right 06/05/2022   Inguinal lymphadenopathy 03/08/2022   MDD (major depressive disorder), recurrent, in partial remission (HCC) 03/06/2022   MDD (major depressive disorder), recurrent episode, mild (HCC) 02/02/2022   Intractable migraine with aura with status migrainosus 01/31/2022   Depression, major, single episode, severe (HCC)  01/31/2022   Cervicogenic headache 12/15/2021   Somatic dysfunction of cervical region 12/15/2021   Neck muscle spasm 11/03/2021   GAD (generalized anxiety disorder) 11/03/2021   History of affective disorder 11/03/2021   CIN I (cervical intraepithelial neoplasia I) 02/14/2021   Genital herpes simplex type 2 02/10/2021    Vitamin D deficiency 06/28/2020   History of chronic sinusitis 06/28/2020   Monochorionic diamniotic twin gestation 05/20/2019   Intractable chronic migraine without aura with status migrainosus 02/11/2017   Vision loss of left eye 01/17/2017   Acne 11/18/2014    History obtained from: chart review and patient.  Amanda [name] was referred by Alfredia Ferguson, PA-C.     Amanda [name] is a 27 y.o. female presenting for an evaluation of urticaria . She has been having them off and on for 6 months. They got bad when she came back from [place] This was around 3 weeks ago. When she came back, they were all over her and she was breaking out everywhere. She had blisters everywhere. They come and go. She has always had allergies to things. She does not think that this occurs with food. He has a rash on her arms. She has tried changing deodorants. This is the first time that she has ever had hives. She never remembers having them as a child.   Her brother is allergic to "everything" as if her dad. She had hives with penicillin as an infant.   She did see Dr. Corliss Skains for evaluation of a positive ANA. She thinks that this might be the begging of something rheumatologic. She also has a history of a goiter and sees Endocrinology for this. She has seen Dr. Roseanne Reno with Dermatology. She got some medicatiosn from [employer] but nothing really helped.    Allergic Rhinitis Symptom History: She has sneezing and itchy eyes all of the time. She is using Zyrtec as well as a nasal spray. These do not help completely. She dogs through the home (two, both of which are shedders).  She has never been allergy tested. She has never been on any routine medications for her symptoms. She has not been on antibiotics at all.   Otherwise, there is no history of other atopic diseases, including asthma, food allergies, drug allergies, stinging insect allergies, or contact dermatitis. There is no significant infectious history.  Vaccinations are up to date.    Past Medical History: Patient Active Problem List   Diagnosis Date Noted   Hip flexor tendon tightness, left 05/17/2023   Raynaud's syndrome 12/15/2022   Insomnia 07/21/2022   Neck pain 07/10/2022   ANA positive 07/10/2022   Hot flashes 07/10/2022   Sinusitis 07/04/2022   Thyromegaly 07/04/2022   Adenoid vegetations 06/21/2022   MDD (major depressive disorder), recurrent, in full remission (HCC) 06/06/2022   Abscess of groin, right 06/05/2022   Inguinal lymphadenopathy 03/08/2022   MDD (major depressive disorder), recurrent, in partial remission (HCC) 03/06/2022   MDD (major depressive disorder), recurrent episode, mild (HCC) 02/02/2022   Intractable migraine with aura with status migrainosus 01/31/2022   Depression, major, single episode, severe (HCC) 01/31/2022   Cervicogenic headache 12/15/2021   Somatic dysfunction of cervical region 12/15/2021   Neck muscle spasm 11/03/2021   GAD (generalized anxiety disorder) 11/03/2021   History of affective disorder 11/03/2021   CIN I (cervical intraepithelial neoplasia I) 02/14/2021   Genital herpes simplex type 2 02/10/2021   Vitamin D deficiency 06/28/2020   History of chronic sinusitis 06/28/2020   Monochorionic  diamniotic twin gestation 05/20/2019   Intractable chronic migraine without aura with status migrainosus 02/11/2017   Vision loss of left eye 01/17/2017   Acne 11/18/2014    Medication List:  Allergies as of 06/07/2023       Reactions   Doxycycline Other (See Comments)   Exacerbates Migraines   Imitrex [sumatriptan] Swelling   Facial swelling and burning lips   Penicillins Hives   Did it involve swelling of the face/tongue/throat, SOB, or low BP? No Did it involve sudden or severe rash/hives, skin peeling, or any reaction on the inside of your mouth or nose? Yes Did you need to seek medical attention at a hospital or doctor's office? Yes When did it last happen?      childhood  allergy If all above answers are "NO", may proceed with cephalosporin use.        Medication List        Accurate as of June 07, 2023 11:59 PM. If you have any questions, ask your nurse or doctor.          Adapalene 0.3 % gel Commonly known as: Differin Apply to face qhs, wash off qam   Clindamycin-Benzoyl Per (Refr) gel Commonly known as: Duac Apply to face qam, wash off qhs   escitalopram 20 MG tablet Commonly known as: Lexapro Take 1 tablet (20 mg total) by mouth daily. Dose increase   famotidine 20 MG tablet Commonly known as: PEPCID Take 1 tablet (20 mg total) by mouth 2 (two) times daily. Started by: Alfonse Spruce, MD   hydrOXYzine 25 MG capsule Commonly known as: VISTARIL TAKE 1-2 CAPSULES (25-50 MG TOTAL) BY MOUTH AT BEDTIME AS NEEDED. FOR ANXIETY AND SLEEP   levocetirizine 5 MG tablet Commonly known as: XYZAL Take 1 tablet (5 mg total) by mouth every evening. Started by: Alfonse Spruce, MD   minocycline 100 MG tablet Commonly known as: DYNACIN Take 1 tablet (100 mg total) by mouth daily.   mupirocin ointment 2 % Commonly known as: BACTROBAN Apply 1 Application topically 2 (two) times daily.   tiZANidine 2 MG tablet Commonly known as: ZANAFLEX Take 1 tablet (2 mg total) by mouth at bedtime.   topiramate 25 MG tablet Commonly known as: TOPAMAX Take 3 tablets (75 mg total) by mouth at bedtime.   traZODone 50 MG tablet Commonly known as: DESYREL TAKE 1/2 TABLET BY MOUTH AT BEDTIME AS NEEDED FOR SLEEP   triamcinolone lotion 0.1 % Commonly known as: KENALOG Apply 1 Application topically 2 (two) times daily.   valACYclovir 1000 MG tablet Commonly known as: VALTREX Take 1 tablet (1,000 mg total) by mouth daily.   Zavzpret 10 MG/ACT Soln Generic drug: Zavegepant HCl Place 1 spray into the nose daily as needed. Maximum 1 spray in 24 hours.        Birth History: non-contributory  Developmental History: non-contributory  Past  Surgical History: Past Surgical History:  Procedure Laterality Date   NO PAST SURGERIES     TONSILLECTOMY Bilateral 09/05/2022   Procedure: TONSILLECTOMY;  Surgeon: Geanie Logan, MD;  Location: [facility];  Service: ENT;  Laterality: Bilateral;   TONSILLECTOMY     WISDOM TOOTH EXTRACTION       Family History: Family History  Problem Relation Age of Onset   Migraines Mother    Allergic rhinitis Father    Skin cancer Father    Allergic rhinitis Brother    Allergies Brother    Thyroid disease Maternal Aunt  Breast cancer Maternal Aunt 45   Asthma Maternal Grandmother    Hypertension Maternal Grandmother    Thyroid disease Maternal Grandmother    Hypertension Maternal Grandfather    Pancreatic cancer Maternal Grandfather 70   Colon cancer Maternal Grandfather 60   Hypertension Paternal Grandmother    Diabetes Paternal Grandmother    Hypertension Paternal Grandfather    Diabetes Paternal Grandfather    Colon cancer Paternal Grandfather 75     Social History: Sweetie lives at home with her family.  They live in a house that are 25 years old.  There is carpeting throughout the home.  They have electric heating and central cooling.  There are 2 dogs inside of the home.  There are no dust mite covers on the bedding.  There is tobacco exposure in the home.  She currently works as a Architectural technologist.  There are no fumes, chemicals, or dust at her home.  She does clean the house as well and is exposed to fumes, chemicals, and dust, including a cat.  There is no HEPA filter in the home.  She does not live near an interstate or industrial area.   Review of Systems  Constitutional: Negative.  Negative for chills, fever, malaise/fatigue and weight loss.  HENT: Negative.  Negative for congestion, ear discharge and ear pain.   Eyes:  Negative for pain, discharge and redness.  Respiratory:  Negative for cough, sputum production, shortness of breath and wheezing.   Cardiovascular:  Negative.  Negative for chest pain and palpitations.  Gastrointestinal:  Negative for abdominal pain, constipation, diarrhea, heartburn, nausea and vomiting.  Skin:  Positive for itching and rash.  Neurological:  Negative for dizziness and headaches.  Endo/Heme/Allergies:  Negative for environmental allergies. Does not bruise/bleed easily.       Objective:   Blood pressure 122/78, pulse (!) 102, temperature 99.4 F (37.4 C), resp. rate 18, height 5' 3.78" (1.62 m), weight 157 lb 3.2 oz (71.3 kg), last menstrual period 05/21/2023, SpO2 96 %. Body mass index is 27.17 kg/m.     Physical Exam Vitals reviewed.  Constitutional:      Appearance: She is well-developed.     Comments: Very friendly.   HENT:     Head: Normocephalic and atraumatic.     Right Ear: Tympanic membrane, ear canal and external ear normal. No drainage, swelling or tenderness. Tympanic membrane is not injected, scarred, erythematous, retracted or bulging.     Left Ear: Tympanic membrane, ear canal and external ear normal. No drainage, swelling or tenderness. Tympanic membrane is not injected, scarred, erythematous, retracted or bulging.     Nose: No nasal deformity, septal deviation, mucosal edema or rhinorrhea.     Right Turbinates: Enlarged and swollen.     Left Turbinates: Enlarged and swollen.     Right Sinus: No maxillary sinus tenderness or frontal sinus tenderness.     Left Sinus: No maxillary sinus tenderness or frontal sinus tenderness.     Comments: No nasal polyps noted.     Mouth/Throat:     Mouth: Mucous membranes are not pale and not dry.     Pharynx: Uvula midline.  Eyes:     General: Lids are normal. No allergic shiner.       Right eye: No discharge.        Left eye: No discharge.     Conjunctiva/sclera: Conjunctivae normal.     Right eye: Right conjunctiva is not injected. No chemosis.    Left eye: Left  conjunctiva is not injected. No chemosis.    Pupils: Pupils are equal, round, and reactive  to light.  Cardiovascular:     Rate and Rhythm: Normal rate and regular rhythm.     Heart sounds: Normal heart sounds.  Pulmonary:     Effort: Pulmonary effort is normal. No tachypnea, accessory muscle usage or respiratory distress.     Breath sounds: Normal breath sounds. No wheezing, rhonchi or rales.  Chest:     Chest wall: No tenderness.  Abdominal:     Tenderness: There is no abdominal tenderness. There is no guarding or rebound.  Lymphadenopathy:     Head:     Right side of head: No submandibular, tonsillar or occipital adenopathy.     Left side of head: No submandibular, tonsillar or occipital adenopathy.     Cervical: No cervical adenopathy.  Skin:    Coloration: Skin is not pale.     Findings: No abrasion, erythema, petechiae or rash. Rash is not papular, urticarial or vesicular.  Neurological:     Mental Status: She is alert.  Psychiatric:        Behavior: Behavior is cooperative.      Diagnostic studies:   Allergy Studies:     Airborne Adult Perc - 06/07/23 1510     Time Antigen Placed 1450    Allergen Manufacturer Waynette Buttery    Location Back    Number of Test 55    1. Control-Buffer 50% Glycerol Negative    2. Control-Histamine 2+    3. Bahia Negative    4. French Southern Territories Negative    5. Johnson Negative    6. Kentucky Blue Negative    7. Meadow Fescue Negative    8. Perennial Rye Negative    9. Timothy Negative    10. Ragweed Mix Negative    11. Cocklebur Negative    12. Plantain,  English Negative    13. Baccharis Negative    14. Dog Fennel Negative    15. Guernsey Thistle 2+    16. Lamb's Quarters Negative    17. Sheep Sorrell 2+    18. Rough Pigweed 2+    19. Marsh Elder, Rough Negative    20. Mugwort, Common Negative    21. Box, Elder Negative    22. Cedar, red Negative    23. Sweet Gum Negative    24. Pecan Pollen 3+    25. Pine Mix Negative    26. Walnut, Black Pollen 3+    27. Red Mulberry 2+    28. Ash Mix Negative    29. Birch Mix Negative     30. Beech American 2+    31. Cottonwood, Guinea-[place]issau Negative    32. Hickory, White Negative    33. Maple Mix Negative    34. Oak, [place] Mix 2+    35. Sycamore Eastern Negative    36. Alternaria Alternata Negative    37. Cladosporium Herbarum 2+    38. Aspergillus Mix Negative    39. Penicillium Mix Negative    40. Bipolaris Sorokiniana (Helminthosporium) Negative    41. Drechslera Spicifera (Curvularia) Negative    42. Mucor Plumbeus Negative    43. Fusarium Moniliforme Negative    44. Aureobasidium Pullulans (pullulara) Negative    45. Rhizopus Oryzae 2+    46. Botrytis Cinera Negative    47. Epicoccum Nigrum Negative    48. Phoma Betae Negative    49. Dust Mite Mix 3+    50. Cat Hair 10,000 BAU/ml 2+  51.  Dog Epithelia Negative    52. Mixed Feathers Negative    53. Horse Epithelia 2+    54. Cockroach, German 2+    55. Tobacco Leaf Negative             13 Food Perc - 06/07/23 1510       Test Information   Time Antigen Placed 1450    Allergen Manufacturer Waynette Buttery    Location Back    Number of allergen test 13      Food   1. Peanut Negative    2. Soybean Negative    3. Wheat Negative    4. Sesame Negative    5. Milk, Cow Negative    6. Casein Negative    7. Egg White, Chicken Negative    8. Shellfish Mix Negative    9. Fish Mix Negative    10. Cashew Negative    11. Walnut Food Negative    12. Almond Negative    13. Hazelnut Negative             Allergy testing results were read and interpreted by myself, documented by clinical staff.         Malachi Bonds, MD [facility]

## 2023-06-07 NOTE — Patient Instructions (Addendum)
1. Chronic urticaria - Your history does not have any "red flags" such as fevers, joint pains, or permanent skin changes that would be concerning for a more serious cause of hives.  - We will get some labs to rule out serious causes of hives:  - Alpha-Gal Panel - Chronic Urticaria - CMP14+EGFR - Tryptase - Thyroid antibodies - CBC With Differential - Chronic hives are often times a self limited process and will "burn themselves out" over 6-12 months, although this is not always the case.  - In the meantime, start suppressive dosing of antihistamines:   - Morning: Xyzal (levocetirizine) 5 mg + Pepcid (famotidine) 20mg   - Evening: Xyzal (levocetirizine) 5 mg + Pepcid (famotidine) 20mg  - You can change this dosing at home, decreasing the dose as needed or increasing the dosing as needed.  - If you are not tolerating the medications or are tired of taking them every day, we can start treatment with a monthly injectable medication called Xolair.   2. Chronic rhinitis - Testing today showed: weeds, trees, indoor molds, outdoor molds, dust mites, cat, cockroach, and horse - Copy of test results provided.  - Avoidance measures provided. - Start taking: antihistamines as above - You can use an extra dose of the antihistamine, if needed, for breakthrough symptoms.  - Consider nasal saline rinses 1-2 times daily to remove allergens from the nasal cavities as well as help with mucous clearance (this is especially helpful to do before the nasal sprays are given) - We could consider allergy shots as a means of long-term control. - Allergy shots "re-train" and "reset" the immune system to ignore environmental allergens and decrease the resulting immune response to those allergens (sneezing, itchy watery eyes, runny nose, nasal congestion, etc).    - Allergy shots improve symptoms in 75-85% of patients.  - We can discuss more at the next appointment if the medications are not working for you.  3. Return  in about 2 months (around 08/07/2023). You can have the follow up appointment with Dr. Dellis Anes or a Nurse Practicioner (our Nurse Practitioners are excellent and always have Physician oversight!).    Please inform us of any Emergency Department visits, hospitalizations, or changes in symptoms. Call us before going to the ED for breathing or allergy symptoms since we might be able to fit you in for a sick visit. Feel free to contact us anytime with any questions, problems, or concerns.  It was a pleasure to meet you and your family today! Your kids are SO CUTE and they were SO WELL BEHAVED!   Websites that have reliable patient information: 1. American Academy of Asthma, Allergy, and Immunology: www.aaaai.org 2. Food Allergy Research and Education (FARE): foodallergy.org 3. Mothers of Asthmatics: http://www.asthmacommunitynetwork.org 4. American College of Allergy, Asthma, and Immunology: www.acaai.org   COVID-19 Vaccine Information can be found at: [email or web address] For questions related to vaccine distribution or appointments, please email [email or web address] or call [phone].   We realize that you might be concerned about having an allergic reaction to the COVID19 vaccines. To help with that concern, WE ARE OFFERING THE COVID19 VACCINES IN OUR OFFICE! Ask the front desk for dates!     "Like" [place] on Facebook and Instagram for our latest updates!      A healthy democracy works best when [employer] participate! Make sure you are registered to vote! If you have moved or changed any of your contact information, you will need to get this updated before voting!  In some cases,  you MAY be able to register to vote online: [email or web address]       Airborne Adult Perc - 06/07/23 1510     Time Antigen Placed 1450    Allergen Manufacturer Waynette Buttery    Location Back    Number of Test 55    1.  Control-Buffer 50% Glycerol Negative    2. Control-Histamine 2+    3. Bahia Negative    4. [place] Negative    5. Johnson Negative    6. Kentucky Blue Negative    7. Meadow Fescue Negative    8. Perennial Rye Negative    9. Timothy Negative    10. Ragweed Mix Negative    11. Cocklebur Negative    12. Plantain,  English Negative    13. Baccharis Negative    14. Dog Fennel Negative    15. [place] Thistle 2+    16. Lamb's Quarters Negative    17. Sheep Sorrell 2+    18. Rough Pigweed 2+    19. Marsh Elder, Rough Negative    20. Mugwort, Common Negative    21. Box, Elder Negative    22. Cedar, red Negative    23. Sweet Gum Negative    24. Pecan Pollen 3+    25. Pine Mix Negative    26. Walnut, Black Pollen 3+    27. Red Mulberry 2+    28. Ash Mix Negative    29. Birch Mix Negative    30. Beech American 2+    31. Cottonwood, [place] Negative    32. Hickory, White Negative    33. Maple Mix Negative    34. Oak, [place] Mix 2+    35. Sycamore Eastern Negative    36. Alternaria Alternata Negative    37. Cladosporium Herbarum 2+    38. Aspergillus Mix Negative    39. Penicillium Mix Negative    40. Bipolaris Sorokiniana (Helminthosporium) Negative    41. Drechslera Spicifera (Curvularia) Negative    42. Mucor Plumbeus Negative    43. Fusarium Moniliforme Negative    44. Aureobasidium Pullulans (pullulara) Negative    45. Rhizopus Oryzae 2+    46. Botrytis Cinera Negative    47. Epicoccum Nigrum Negative    48. Phoma Betae Negative    49. Dust Mite Mix 3+    50. Cat Hair 10,000 BAU/ml 2+    51.  Dog Epithelia Negative    52. Mixed Feathers Negative    53. Horse Epithelia 2+    54. Cockroach, German 2+    55. Tobacco Leaf Negative             13 Food Perc - 06/07/23 1510       Test Information   Time Antigen Placed 1450    Allergen Manufacturer Waynette Buttery    Location Back    Number of allergen test 13      Food   1. Peanut Negative    2. Soybean Negative    3.  Wheat Negative    4. Sesame Negative    5. Milk, Cow Negative    6. Casein Negative    7. Egg White, Chicken Negative    8. Shellfish Mix Negative    9. Fish Mix Negative    10. Cashew Negative    11. Walnut Food Negative    12. Almond Negative    13. Hazelnut Negative             Reducing Pollen Exposure  The American Academy of  Allergy, Asthma and Immunology suggests the following steps to reduce your exposure to pollen during allergy seasons.    Do not hang sheets or clothing out to dry; pollen may collect on these items. Do not mow lawns or spend time around freshly cut grass; mowing stirs up pollen. Keep windows closed at night.  Keep car windows closed while driving. Minimize morning activities outdoors, a time when pollen counts are usually at their highest. Stay indoors as much as possible when pollen counts or humidity is high and on windy days when pollen tends to remain in the air longer. Use air conditioning when possible.  Many air conditioners have filters that trap the pollen spores. Use a HEPA room air filter to remove pollen form the indoor air you breathe.  Control of Mold Allergen   Mold and fungi can grow on a variety of surfaces provided certain temperature and moisture conditions exist.  Outdoor molds grow on plants, decaying vegetation and soil.  The major outdoor mold, Alternaria and Cladosporium, are found in very high numbers during hot and dry conditions.  Generally, a late Summer - Fall peak is seen for common outdoor fungal spores.  Rain will temporarily lower outdoor mold spore count, but counts rise rapidly when the rainy period ends.  The most important indoor molds are Aspergillus and Penicillium.  Dark, humid and poorly ventilated basements are ideal sites for mold growth.  The next most common sites of mold growth are the bathroom and the kitchen.  Outdoor (Seasonal) Mold Control  Positive outdoor molds via skin testing: Cladosporium  Use air  conditioning and keep windows closed Avoid exposure to decaying vegetation. Avoid leaf raking. Avoid grain handling. Consider wearing a face mask if working in moldy areas.    Indoor (Perennial) Mold Control   Positive indoor molds via skin testing: Rhizopus  Maintain humidity below 50%. Clean washable surfaces with 5% bleach solution. Remove sources e.g. contaminated carpets.    Control of Dust Mite Allergen    Dust mites play a major role in allergic asthma and rhinitis.  They occur in environments with high humidity wherever human skin is found.  Dust mites absorb humidity from the atmosphere (ie, they do not drink) and feed on organic matter (including shed human and animal skin).  Dust mites are a microscopic type of insect that you cannot see with the naked eye.  High levels of dust mites have been detected from mattresses, pillows, carpets, upholstered furniture, bed covers, clothes, soft toys and any woven material.  The principal allergen of the dust mite is found in its feces.  A gram of dust may contain 1,000 mites and 250,000 fecal particles.  Mite antigen is easily measured in the air during house cleaning activities.  Dust mites do not bite and do not cause harm to humans, other than by triggering allergies/asthma.    Ways to decrease your exposure to dust mites in your home:  Encase mattresses, box springs and pillows with a mite-impermeable barrier or cover   Wash sheets, blankets and drapes weekly in hot water (130 F) with detergent and dry them in a dryer on the hot setting.  Have the room cleaned frequently with a vacuum cleaner and a damp dust-mop.  For carpeting or rugs, vacuuming with a vacuum cleaner equipped with a high-efficiency particulate air (HEPA) filter.  The dust mite allergic individual should not be in a room which is being cleaned and should wait 1 hour after cleaning before going into  the room. Do not sleep on upholstered furniture (eg, couches).   If  possible removing carpeting, upholstered furniture and drapery from the home is ideal.  Horizontal blinds should be eliminated in the rooms where the person spends the most time (bedroom, study, television room).  Washable vinyl, roller-type shades are optimal. Remove all non-washable stuffed toys from the bedroom.  Wash stuffed toys weekly like sheets and blankets above.   Reduce indoor humidity to less than 50%.  Inexpensive humidity monitors can be purchased at most hardware stores.  Do not use a humidifier as can make the problem worse and are not recommended.  Control of Cockroach Allergen  Cockroach allergen has been identified as an important cause of acute attacks of asthma, especially in urban settings.  There are fifty-five species of cockroach that exist in the [place], however only three, the [place], [place] species produce allergen that can affect patients with Asthma.  Allergens can be obtained from fecal particles, egg casings and secretions from cockroaches.    Remove food sources. Reduce access to water. Seal access and entry points. Spray runways with 0.5-1% Diazinon or Chlorpyrifos Blow boric acid power under stoves and refrigerator. Place bait stations (hydramethylnon) at feeding sites.

## 2023-06-08 ENCOUNTER — Encounter: Payer: Self-pay | Admitting: Allergy & Immunology

## 2023-06-09 MED ORDER — LEVOCETIRIZINE DIHYDROCHLORIDE 5 MG PO TABS: 5.0000 mg | ORAL_TABLET | Freq: Two times a day (BID) | ORAL | 5 refills | Status: DC

## 2023-06-10 LAB — ALPHA-GAL PANEL
Allergen Lamb IgE: <0.1 kU/L
Beef IgE: <0.1 kU/L
IgE (Immunoglobulin E), Serum: 121 IU/mL (ref 6–495)
O215-IgE Alpha-Gal: <0.1 kU/L
Pork IgE: <0.1 kU/L

## 2023-06-10 LAB — ALLERGY PANEL 19, SEAFOOD GROUP
Allergen Salmon IgE: <0.1 kU/L
Catfish: <0.1 kU/L
Codfish IgE: <0.1 kU/L
F023-IgE Crab: <0.1 kU/L
F080-IgE Lobster: <0.1 kU/L
Shrimp IgE: 0.1 kU/L — AB
Tuna: <0.1 kU/L

## 2023-06-12 ENCOUNTER — Encounter: Payer: Self-pay | Admitting: Physician Assistant

## 2023-06-14 LAB — CMP14+EGFR
ALT: 5 IU/L (ref 0–32)
AST: 12 IU/L (ref 0–40)
Albumin: 4.6 g/dL (ref 4.0–5.0)
Alkaline Phosphatase: 64 IU/L (ref 44–121)
BUN/Creatinine Ratio: 21 (ref 9–23)
BUN: 16 mg/dL (ref 6–20)
Bilirubin Total: 0.5 mg/dL (ref 0.0–1.2)
CO2: 23 mmol/L (ref 20–29)
Calcium: 9.3 mg/dL (ref 8.7–10.2)
Chloride: 102 mmol/L (ref 96–106)
Creatinine, Ser: 0.78 mg/dL (ref 0.57–1.00)
Globulin, Total: 2.4 g/dL (ref 1.5–4.5)
Glucose: 112 mg/dL — ABNORMAL HIGH (ref 70–99)
Potassium: 4 mmol/L (ref 3.5–5.2)
Sodium: 139 mmol/L (ref 134–144)
Total Protein: 7 g/dL (ref 6.0–8.5)
eGFR: 108 mL/min/{1.73_m2} (ref >59)

## 2023-06-14 LAB — CBC WITH DIFFERENTIAL
Basophils Absolute: 0 10*3/uL (ref 0.0–0.2)
Basos: 0 %
EOS (ABSOLUTE): 0 10*3/uL (ref 0.0–0.4)
Eos: 1 %
Hematocrit: 40.9 % (ref 34.0–46.6)
Hemoglobin: 13.4 g/dL (ref 11.1–15.9)
Immature Grans (Abs): 0 10*3/uL (ref 0.0–0.1)
Immature Granulocytes: 0 %
Lymphocytes Absolute: 1.5 10*3/uL (ref 0.7–3.1)
Lymphs: 22 %
MCH: 28.8 pg (ref 26.6–33.0)
MCHC: 32.8 g/dL (ref 31.5–35.7)
MCV: 88 fL (ref 79–97)
Monocytes Absolute: 0.3 10*3/uL (ref 0.1–0.9)
Monocytes: 5 %
Neutrophils Absolute: 5 10*3/uL (ref 1.4–7.0)
Neutrophils: 72 %
RBC: 4.65 x10E6/uL (ref 3.77–5.28)
RDW: 11.9 % (ref 11.7–15.4)
WBC: 6.9 10*3/uL (ref 3.4–10.8)

## 2023-06-14 LAB — CHRONIC URTICARIA: cu index: 18.2 — ABNORMAL HIGH (ref <10)

## 2023-06-14 LAB — TRYPTASE: Tryptase: 3.9 ug/L (ref 2.2–13.2)

## 2023-06-14 LAB — THYROID ANTIBODIES
Thyroglobulin Antibody: <1 IU/mL (ref 0.0–0.9)
Thyroperoxidase Ab SerPl-aCnc: <9 IU/mL (ref 0–34)

## 2023-06-15 ENCOUNTER — Encounter: Payer: Self-pay | Admitting: Allergy & Immunology

## 2023-06-20 ENCOUNTER — Telehealth: Payer: Self-pay | Admitting: *Deleted

## 2023-06-20 MED ORDER — MONTELUKAST SODIUM 10 MG PO TABS: 10.0000 mg | ORAL_TABLET | Freq: Every day | ORAL | 5 refills | Status: DC

## 2023-06-20 NOTE — Telephone Encounter (Signed)
OK we can decide next time she sees me. I will add on Singulair today. Can someone call and let her know the plan and discuss the side effects of Singulair? Thanks!   Malachi Bonds, MD [facility]

## 2023-06-20 NOTE — Addendum Note (Signed)
Addended by: Alfonse Spruce on: 06/20/2023 04:17 PM   Modules accepted: Orders

## 2023-06-20 NOTE — Telephone Encounter (Signed)
My chart message requesting to start Xolair for CIU. Patient Ins denied Xolair needs to try and fail Montelukast in addition to two antihistamine therapies. Please advise

## 2023-06-20 NOTE — Telephone Encounter (Signed)
Called patient - DOB verified - reviewed Montelukast (Singulair) side effects with patient per provider.  Patient verbalized understanding, no further questions.

## 2023-06-26 ENCOUNTER — Ambulatory Visit
Admission: RE | Admit: 2023-06-26 | Discharge: 2023-06-26 | Disposition: A | Discharge status: Home / Self Care | Payer: Medicaid Other | Source: Ambulatory Visit | Attending: Physician Assistant | Admitting: Physician Assistant

## 2023-06-26 DIAGNOSIS — R1030 Lower abdominal pain, unspecified: Secondary | ICD-10-CM | POA: Insufficient documentation

## 2023-06-26 DIAGNOSIS — N939 Abnormal uterine and vaginal bleeding, unspecified: Secondary | ICD-10-CM | POA: Insufficient documentation

## 2023-06-27 ENCOUNTER — Encounter: Payer: Self-pay | Admitting: Physician Assistant

## 2023-07-02 ENCOUNTER — Ambulatory Visit: Payer: Medicaid Other | Admitting: Psychiatry

## 2023-07-02 ENCOUNTER — Encounter: Payer: Self-pay | Admitting: Psychiatry

## 2023-07-02 VITALS — BP 95/61 | HR 94 | Temp 98.1°F | Ht 63.78 in | Wt 158.8 lb

## 2023-07-02 DIAGNOSIS — F3342 Major depressive disorder, recurrent, in full remission: Secondary | ICD-10-CM | POA: Diagnosis not present

## 2023-07-02 DIAGNOSIS — F411 Generalized anxiety disorder: Secondary | ICD-10-CM

## 2023-07-02 DIAGNOSIS — G4701 Insomnia due to medical condition: Secondary | ICD-10-CM | POA: Diagnosis not present

## 2023-07-02 MED ORDER — ESCITALOPRAM OXALATE 20 MG PO TABS: 20.0000 mg | ORAL_TABLET | Freq: Every day | ORAL | 0 refills | Status: DC

## 2023-07-02 MED ORDER — BUSPIRONE HCL 10 MG PO TABS
10.0000 mg | ORAL_TABLET | Freq: Two times a day (BID) | ORAL | 1 refills | Status: DC
Start: 2023-07-02 — End: 2023-07-26

## 2023-07-02 NOTE — Progress Notes (Unsigned)
BH MD OP Progress Note  07/02/2023 11:49 AM Amanda [name]  MRN:  [id number]  Chief Complaint:  Chief Complaint  Patient presents with   Follow-up   Anxiety   Depression   Medication Refill   HPI: Amanda [name] is a 27 year old female, engaged, lives in [place], self-employed, has a history of GAD, MDD was evaluated in office today.  Patient today reports she is currently struggling with worsening anxiety symptoms.  She reports she is worried about paying her bills, has a lot of financial stressors.  She hence has a lot of racing thoughts, has anxiety often, sleep problems likely due to anxiety ongoing since the past several weeks.  She is compliant on the Lexapro.  Denies side effects.  Does have hydroxyzine available as needed which she uses when she needs it.  However does not want to use anything stronger at night since she is worried she will not be able to hear her children if they need her help.  She has young children who are around 77 years old.  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  She also reports she has been having menstrual irregularities, bleeding a lot since the past couple of months, planning to talk to her OB/GYN.  That also makes her tired and fatigued.  Patient denies any suicidality, homicidality or perceptual disturbances.  Patient is motivated to stay in psychotherapy.  Has upcoming appointment with Ms. [name] Hussami.  Denies any other concerns today.  Visit Diagnosis:    ICD-10-CM   1. GAD (generalized anxiety disorder)  F41.1 busPIRone (BUSPAR) 10 MG tablet    escitalopram (LEXAPRO) 20 MG tablet    2. MDD (major depressive disorder), recurrent, in full remission (HCC)  F33.42 escitalopram (LEXAPRO) 20 MG tablet    3.  Insomnia due to medical condition  G47.01    mood symptoms      Past Psychiatric History: I have reviewed past psychiatric history from my progress note on 02/02/2022.  Past trials of Zoloft-did not work, Effexor-made her suicidal.  Past Medical History:  Past Medical History:  Diagnosis Date   Angio-edema    BRCA negative 09/2020   MyRisk neg except AXIN2 VUS   Family history of adverse reaction to anesthesia    heart condition   Family history of breast cancer 09/2020   IBIS=15.7%/riskscore=14.7%   Family history of pancreatic cancer    Family history of thyroid disease    GERD (gastroesophageal reflux disease)    Migraine    Urticaria     Past Surgical History:  Procedure Laterality Date   NO PAST SURGERIES     TONSILLECTOMY Bilateral 09/05/2022   Procedure: TONSILLECTOMY;  Surgeon: Geanie Logan, MD;  [facility];  Service: ENT;  Laterality: Bilateral;   TONSILLECTOMY     WISDOM TOOTH EXTRACTION      Family Psychiatric History: I have reviewed family psychiatric history from my  progress note on 02/02/2022.  Family History:  Family History  Problem Relation Age of Onset   Migraines Mother    Allergic rhinitis Father    Skin cancer Father    Allergic rhinitis Brother    Allergies Brother    Thyroid disease Maternal Aunt    Breast cancer Maternal Aunt 24  Asthma Maternal Grandmother    Hypertension Maternal Grandmother    Thyroid disease Maternal Grandmother    Hypertension Maternal Grandfather    Pancreatic cancer Maternal Grandfather 58   Colon cancer Maternal Grandfather 60   Hypertension Paternal Grandmother    Diabetes Paternal Grandmother    Hypertension Paternal Grandfather    Diabetes Paternal Grandfather    Colon cancer Paternal Grandfather 50    Social History: I have reviewed social history from my progress note on 02/02/2022. Social History   Socioeconomic History   Marital status: Single    Spouse name: Environmental manager    Number of children: 2   Years of education: Not on file   Highest education level: Associate degree: occupational, Scientist, product/process development, or vocational program  Occupational History   Occupation: Massage Therapy  Tobacco Use   Smoking status: Never    Passive exposure: Never   Smokeless tobacco: Never  Vaping Use   Vaping status: Never Used  Substance and Sexual Activity   Alcohol use: Never   Drug use: No   Sexual activity: Yes    Partners: Male    Birth control/protection: None  Other Topics Concern   Not on file  Social History Narrative   ** Merged History Encounter **       One story home Right-handed Caffeine: occasional coffee   Social Determinants of Health   Financial Resource Strain: Not on file  Food Insecurity: Not on file  Transportation Needs: Not on file  Physical Activity: Not on file  Stress: Stress Concern Present (12/01/2021)   [employer] of Occupational Health - Occupational Stress Questionnaire    Feeling of Stress : Rather much  Social Connections: Not on file    Allergies:  Allergies  Allergen Reactions   Doxycycline Other (See Comments)    Exacerbates Migraines   Imitrex [Sumatriptan] Swelling    Facial swelling and burning lips   Molds & Smuts    Pollen Extract-Tree Extract [Pollen Extract]    Shrimp Extract    Penicillins Hives    Did it involve swelling of the face/tongue/throat, SOB, or low BP? No Did it involve sudden or severe rash/hives, skin peeling, or any reaction on the inside of your mouth or nose? Yes Did you need to seek medical attention at a hospital or doctor's office? Yes When did it last happen?      childhood allergy If all above answers are "NO", may proceed with cephalosporin use.    Metabolic Disorder Labs: Lab Results  Component Value Date   HGBA1C 5.3 01/07/2015   No results found for: "PROLACTIN" Lab Results  Component Value Date   CHOL 135 06/28/2020   TRIG 54 06/28/2020   HDL 54 06/28/2020   LDLCALC 69  06/28/2020   Lab Results  Component Value Date   TSH 1.78 07/04/2022   TSH 1.600 03/08/2022    Therapeutic Level Labs: No results found for: "LITHIUM" No results found for: "VALPROATE" No results found for: "CBMZ"  Current Medications: Current Outpatient Medications  Medication Sig Dispense Refill   Adapalene (DIFFERIN) 0.3 % gel Apply to face qhs, wash off qam 45 g 3   busPIRone (BUSPAR) 10 MG tablet Take 1 tablet (10 mg total) by mouth 2 (two) times daily. 60 tablet 1   Clindamycin-Benzoyl Per, Refr, (DUAC) gel Apply to face qam, wash off qhs 45 g 3   famotidine (PEPCID) 20 MG tablet Take 1 tablet (20 mg total) by mouth 2 (two) times daily. 30 tablet  5   hydrOXYzine (VISTARIL) 25 MG capsule TAKE 1-2 CAPSULES (25-50 MG TOTAL) BY MOUTH AT BEDTIME AS NEEDED. FOR ANXIETY AND SLEEP 180 capsule 0   levocetirizine (XYZAL) 5 MG tablet Take 1 tablet (5 mg total) by mouth in the morning and at bedtime. 60 tablet 5   montelukast (SINGULAIR) 10 MG tablet Take 1 tablet (10 mg total) by mouth at bedtime. 30 tablet 5   tiZANidine (ZANAFLEX) 2 MG tablet Take 1 tablet (2 mg total) by mouth at bedtime. 30 tablet 0   topiramate (TOPAMAX) 25 MG tablet Take 3 tablets (75 mg total) by mouth at bedtime. 90 tablet 5   triamcinolone lotion (KENALOG) 0.1 % Apply 1 Application topically 2 (two) times daily. 60 mL 0   valACYclovir (VALTREX) 1000 MG tablet Take 1 tablet (1,000 mg total) by mouth daily. 90 tablet 2   Zavegepant HCl (ZAVZPRET) 10 MG/ACT SOLN Place 1 spray into the nose daily as needed. Maximum 1 spray in 24 hours. 8 each 5   escitalopram (LEXAPRO) 20 MG tablet Take 1 tablet (20 mg total) by mouth daily. Dose increase 90 tablet 0   traZODone (DESYREL) 50 MG tablet TAKE 1/2 TABLET BY MOUTH AT BEDTIME AS NEEDED FOR SLEEP (Patient not taking: Reported on 07/02/2023) 45 tablet 0   No current facility-administered medications for this visit.     Musculoskeletal: Strength & Muscle Tone: within normal  limits Gait & Station: normal Patient leans: N/A  Psychiatric Specialty Exam: Review of Systems  Psychiatric/Behavioral:  Positive for sleep disturbance. The patient is nervous/anxious.     Blood pressure 95/61, pulse 94, temperature 98.1 F (36.7 C), temperature source Skin, height 5' 3.78" (1.62 m), weight 158 lb 12.8 oz (72 kg), last menstrual period 05/21/2023.Body mass index is 27.45 kg/m.  General Appearance: Fairly Groomed  Eye Contact:  Fair  Speech:  Clear and Coherent  Volume:  Normal  Mood:  Anxious  Affect:  Tearful  Thought Process:  Goal Directed and Descriptions of Associations: Intact  Orientation:  Full (Time, Place, and Person)  Thought Content: Rumination   Suicidal Thoughts:  No  Homicidal Thoughts:  No  Memory:  Immediate;   Fair Recent;   Fair Remote;   Fair  Judgement:  Fair  Insight:  Fair  Psychomotor Activity:  Normal  Concentration:  Concentration: Fair and Attention Span: Fair  Recall:  [employer] of Knowledge: Fair  Language: Fair  Akathisia:  No  Handed:  Right  AIMS (if indicated): not done  Assets:  Communication Skills Desire for Improvement Social Support  ADL's:  Intact  Cognition: WNL  Sleep:  Poor   Screenings: AIMS    Flowsheet Row Video Visit from 07/21/2022 in [facility]  AIMS Total Score 0      GAD-7    Flowsheet Row Office Visit from 07/02/2023 in [facility] Video Visit from 12/12/2022 in [facility] Video Visit from 08/29/2022 in [facility] Video Visit from 07/21/2022 in [facility] Video Visit from 06/06/2022 in [facility]  Total GAD-7 Score 12 9 5 8 11       PHQ2-9    Flowsheet Row Office Visit from 07/02/2023 in [facility] Office Visit  from 06/05/2023 in [facility] Office Visit from 12/21/2022 in [facility] Video Visit from 12/12/2022  in [facility] Counselor from 11/21/2022 in [facility] at [facility] Total Score 0 1 2 0 0  PHQ-9 Total Score -- 7 12 -- --      Flowsheet Row Office Visit from 07/02/2023 in [facility] Counselor from 05/08/2023 in [facility] at [facility] Video Visit from 04/16/2023 in [facility]  C-SSRS RISK CATEGORY No Risk No Risk No Risk        Assessment and Plan: REYGAN HEAGLE is a 27 year old female, engaged, lives in [place], has a history of GAD, MDD was evaluated in office today.  Patient with psychosocial stressors, financial, and other, currently with worsening anxiety, sleep problems will benefit from medication management, psychotherapy sessions, will benefit from the following plan.  Plan GAD-unstable Lexapro 20 mg p.o. daily Start BuSpar 10 mg p.o. twice daily Continue CBT with Ms. [name] Hussami , I have coordinated care.  MDD in remission Lexapro 20 mg p.o. daily  Insomnia-unstable Patient does have hydroxyzine 25-50 mg p.o. nightly as needed available. Trazodone 25 mg as needed available however has not been using it. Discussed sleep hygiene techniques, listening to soft music, reading a book, coloring other relaxation techniques.  She has small children who needs her help at night and she is not comfortable taking a sleep medication at this time.  Follow-up in clinic in 6 to 7 weeks or sooner if needed.  Collaboration of Care: Collaboration of Care: Referral or follow-up with counselor/therapist AEB patient encouraged to continue CBT.  Patient/Guardian was advised Release of Information must be obtained prior to any record release in order  to collaborate their care with an outside provider. Patient/Guardian was advised if they have not already done so to contact the registration department to sign all necessary forms in order for [place] to release information regarding their care.   Consent: Patient/Guardian gives verbal consent for treatment and assignment of benefits for services provided during this visit. Patient/Guardian expressed understanding and agreed to proceed.  This note was generated in part or whole with voice recognition software. Voice recognition is usually quite accurate but there are transcription errors that can and very often do occur. I apologize for any typographical errors that were not detected and corrected.     Jomarie Longs, MD 07/03/2023, 9:46 AM

## 2023-07-02 NOTE — Patient Instructions (Signed)
Buspirone Tablets What is this medication? BUSPIRONE (byoo SPYE rone) treats anxiety. It works by balancing the levels of dopamine and serotonin in your brain, substances that help regulate mood. This medicine may be used for other purposes; ask your health care provider or pharmacist if you have questions. COMMON BRAND NAME(S): BuSpar, Buspar Dividose What should I tell my care team before I take this medication? They need to know if you have any of these conditions: Kidney or liver disease An unusual or allergic reaction to buspirone, other medications, foods, dyes, or preservatives Pregnant or trying to get pregnant Breast-feeding How should I use this medication? Take this medication by mouth with a glass of water. Follow the directions on the prescription label. You may take this medication with or without food. To ensure that this medication always works the same way for you, you should take it either always with or always without food. Take your doses at regular intervals. Do not take your medication more often than directed. Do not stop taking except on the advice of your care team. Talk to your care team about the use of this medication in children. Special care may be needed. Overdosage: If you think you have taken too much of this medicine contact a poison control center or emergency room at once. NOTE: This medicine is only for you. Do not share this medicine with others. What if I miss a dose? If you miss a dose, take it as soon as you can. If it is almost time for your next dose, take only that dose. Do not take double or extra doses. What may interact with this medication? Do not take this medication with any of the following: Linezolid MAOIs like Carbex, Eldepryl, Marplan, Nardil, and Parnate Methylene blue Procarbazine This medication may also interact with the following: Diazepam Digoxin Diltiazem Erythromycin Grapefruit juice Haloperidol Medications for mental  depression or mood problems Medications for seizures like carbamazepine, phenobarbital and phenytoin Nefazodone Other medications for anxiety Rifampin Ritonavir Some antifungal medications like itraconazole, ketoconazole, and voriconazole Verapamil Warfarin This list may not describe all possible interactions. Give your health care provider a list of all the medicines, herbs, non-prescription drugs, or dietary supplements you use. Also tell them if you smoke, drink alcohol, or use illegal drugs. Some items may interact with your medicine. What should I watch for while using this medication? Visit your care team for regular checks on your progress. It may take 1 to 2 weeks before your anxiety gets better. This medication may affect your coordination, reaction time, or judgment. Do not drive or operate machinery until you know how this medication affects you. Sit up or stand slowly to reduce the risk of dizzy or fainting spells. Drinking alcohol with this medication can increase the risk of these side effects. What side effects may I notice from receiving this medication? Side effects that you should report to your care team as soon as possible: Allergic reactions--skin rash, itching, hives, swelling of the face, lips, tongue, or throat Irritability, confusion, fast or irregular heartbeat, muscle stiffness, twitching muscles, sweating, high fever, seizure, chills, vomiting, diarrhea, which may be signs of serotonin syndrome Side effects that usually do not require medical attention (report to your care team if they continue or are bothersome): Anxiety, nervousness Dizziness Drowsiness Headache Nausea Trouble sleeping This list may not describe all possible side effects. Call your doctor for medical advice about side effects. You may report side effects to FDA at 1-800-FDA-1088. Where should I keep   my medication? Keep out of the reach of children. Store at room temperature below 30 degrees C  (86 degrees F). Protect from light. Keep container tightly closed. Throw away any unused medication after the expiration date. NOTE: This sheet is a summary. It may not cover all possible information. If you have questions about this medicine, talk to your doctor, pharmacist, or health care provider.  2024 Elsevier/Gold Standard (2022-06-26 00:00:00)  

## 2023-07-03 ENCOUNTER — Ambulatory Visit (INDEPENDENT_AMBULATORY_CARE_PROVIDER_SITE_OTHER): Payer: Medicaid Other | Admitting: Licensed Clinical Social Worker

## 2023-07-03 DIAGNOSIS — F411 Generalized anxiety disorder: Secondary | ICD-10-CM

## 2023-07-03 NOTE — Progress Notes (Signed)
Virtual Visit via Video Note  I connected with Amanda [name] on 07/03/23 at  9:00 AM EDT by a video enabled telemedicine application and verified that I am speaking with the correct person using two identifiers.  Location: Patient: home Provider: remote office [place], [place])   I discussed the limitations of evaluation and management by telemedicine and the availability of in person appointments. The patient expressed understanding and agreed to proceed.  I discussed the assessment and treatment plan with the patient. The patient was provided an opportunity to ask questions and all were answered. The patient agreed with the plan and demonstrated an understanding of the instructions.   The patient was advised to call back or seek an in-person evaluation if the symptoms worsen or if the condition fails to improve as anticipated.  I provided 60 minutes of non-face-to-face time during this encounter.   Baileigh Modisette R Calley Drenning, LCSW   THERAPIST PROGRESS NOTE  Session Time: 9-10a  Participation Level: Active  Behavioral Response: NeatAlertAnxious  Type of Therapy: Individual Therapy  Treatment Goals addressed: LTG: Reduce overall frequency, intensity, and duration of the anxiety so that daily functioning is not impaired per pt self report 3 out of 5 sessions documented.    STG: Learn and implement coping skills that result in a reduction of anxiety and worry, and improve daily functioning per pt report 3 out of 5 sessions documented    ProgressTowards Goals: Progressing  Interventions: CBT  Summary: Amanda [name] is a 27 y.o. female who presents with continuing symptoms related to anxiety.    Allowed pt to explore recent financial stressors. Discussed ways that pt has tried to improve situation and utilize resources available to them. Encouraged pt to explore ways of making changes that could improve overall financial stress. 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  Pt is considering the offer since it would be flexible and allow pt to navigate around her children's school schedule. Pt is unsure whether she could commit to pre k for an entire day "I might could manage 1/2 a day).   Pt reports ongoing medical concerns including cycle-related pain and other symptoms. Allowed pt to identify current symptoms, treatments, and future treatment plans/recommendations.Clinician and patient continued to discuss problem solving regarding complex medical history, implementation and practice of relaxation activities and self-care, addressing addressing impact of current medications, and encouraging patient to communicate thoughts and feelings to medical professionals, and anyone else in their inner circle.   Continued recommendations are as follows: self care behaviors, positive social engagements, focusing on overall work/home/life balance, and focusing on positive physical and emotional wellness.   Suicidal/Homicidal: No  Therapist Response: Pt is continuing to apply interventions learned in session into daily life situations. Pt is currently on track to meet goals utilizing interventions mentioned above. Personal growth and progress noted. Treatment to continue as indicated.   Encouraged patient to continue engaging in positive behavioral activities, and encouraged patient to challenge negative thoughts.  Assisted patient in exploring the benefits and consequences of going back to work versus staying home.  Plan: Informed patient that clinician will be leaving outpatient department. Allowed pt to explore any questions or concerns and discussed future counseling options/resources. Provided pt with psychoeducational resources and list of OPT therapists. Encouraged pt to continue with psychiatric med management appointments, if applicable.   Diagnosis:  Encounter Diagnosis  Name Primary?   GAD (generalized anxiety disorder) Yes   Collaboration of Care: Other pt encouraged  to continue with psychiatrist of record, Dr. Jomarie Longs  Patient/Guardian was advised Release of Information must be obtained prior to any record release in order to collaborate their care with an outside provider. Patient/Guardian was advised if they have not already done so to contact the registration department to sign all necessary forms in order for [place] to release information regarding their care.   Consent: Patient/Guardian gives verbal consent for treatment and assignment of benefits for services provided during this visit. Patient/Guardian expressed understanding and agreed to proceed.   Ernest Haber Aedan Geimer, LCSW 07/03/2023

## 2023-07-03 NOTE — Patient Instructions (Signed)
Outpatient Psychiatry and Counseling  FOR CRISIS:  call 911, [facility] Alternatives: Mobile Crisis Management 24 hours:  [phone], call [phone], [facility] ([facility]) [address] walk in, or go to your local EMERGENCY DEPARTMENT  [facility] [address], [place], [place] [address]  [phone]  [facility] [address] [place], [place] [address] [phone]  [facility] [address] [place],  [place]  [address] [phone]  [facility] Address: [address] [place] [place], [place] [address] Phone: [phone]  [facility] [place] and [place] Locations) [email or web address]  [employer] [facility] [employer] fee and walk in schedule: M-F 8am-12pm/1pm-3pm [address]  [place], [place] [address] [phone]  [facility] [address] [place], [place] [address] [phone]  [facility] Outpatient Services/ Intensive Outpatient Therapy Program/CDIOP/PHP [address] [place], [place] [address] [phone]  [facility], Outpatient Therapy Services, [place]      [phone]     [address]    [place], [place] [address]                 [facility]   [facility] [phone]. [address] [place], [place] [address]  [facility]          [address]  [place], [place] [address]       [phone]  [facility] [address] [place], [place] [address] [phone]  [facility]    [address]    [place], [place] [address]     [phone]       [facility] [address] [place] [address]  [facility]     [address] [place], [place]      [phone]       [facility] Amanda Ditch, MD [address] [place], [place] [address] [phone]  [facility]     [address]     [place], [place] [address]     [phone]       [facility] [address] [place], [place] [address] [phone] Resources for Temporary Residential Assistance/Crisis Centers

## 2023-07-12 ENCOUNTER — Encounter: Payer: Self-pay | Admitting: Physician Assistant

## 2023-07-13 NOTE — Progress Notes (Deleted)
[facility] [address] [place] [address] Phone: [phone] Subjective:    I'm seeing this patient by the request  of:  Alfredia Ferguson, PA-C  CC:   [id number]  Amanda [name] is a 27 y.o. female coming in with complaint of back and neck pain. OMT 05/17/2023. Patient states   Medications patient has been prescribed: Zanaflex  Taking:         Reviewed prior external information including notes and imaging from previsou exam, outside providers and external EMR if available.   As well as notes that were available from care everywhere and other healthcare systems.  Past medical history, social, surgical and family history all reviewed in electronic medical record.  No pertanent information unless stated regarding to the chief complaint.   Past Medical History:  Diagnosis Date   Angio-edema    BRCA negative 09/2020   MyRisk neg except AXIN2 VUS   Family history of adverse reaction to anesthesia    heart condition   Family history of breast cancer 09/2020   IBIS=15.7%/riskscore=14.7%   Family history of pancreatic cancer    Family history of thyroid disease    GERD (gastroesophageal reflux disease)    Migraine    Urticaria     Allergies  Allergen Reactions   Doxycycline Other (See Comments)    Exacerbates Migraines   Imitrex [Sumatriptan] Swelling    Facial swelling and burning lips   Molds & Smuts    Pollen Extract-Tree Extract [Pollen Extract]    Shrimp Extract    Penicillins Hives    Did it involve swelling of the face/tongue/throat, SOB, or low BP? No Did it involve sudden or severe rash/hives, skin peeling, or any reaction on the inside of your mouth or nose? Yes Did you need to seek medical attention at a hospital or doctor's office? Yes When did it last happen?      childhood allergy If all above answers are "NO", may proceed with cephalosporin use.     Review of Systems:  No headache, visual changes, nausea,  vomiting, diarrhea, constipation, dizziness, abdominal pain, skin rash, fevers, chills, night sweats, weight loss, swollen lymph nodes, body aches, joint swelling, chest pain, shortness of breath, mood changes. POSITIVE muscle aches  Objective  There were no vitals taken for this visit.   General: No apparent distress alert and oriented x3 mood and affect normal, dressed appropriately.  HEENT: Pupils equal, extraocular movements intact  Respiratory: Patient's speak in full sentences and does not appear short of breath  Cardiovascular: No lower extremity edema, non tender, no erythema  Gait MSK:  Back   Osteopathic findings  C2 flexed rotated and side bent right C6 flexed rotated and side bent left T3 extended rotated and side bent right inhaled rib T9 extended rotated and side bent left L2 flexed rotated and side bent right Sacrum right on right       Assessment and Plan:  No problem-specific Assessment & Plan notes found for this encounter.    Nonallopathic problems  Decision today to treat with OMT was based on Physical Exam  After verbal consent patient was treated with HVLA, ME, FPR techniques in cervical, rib, thoracic, lumbar, and sacral  areas  Patient tolerated the procedure well with improvement in symptoms  Patient given exercises, stretches and lifestyle modifications  See medications in patient instructions if given  Patient will follow up in 4-8 weeks  Note: This dictation was prepared with Dragon dictation along with smaller phrase technology. Any transcriptional errors that result from this process are unintentional.

## 2023-07-17 ENCOUNTER — Ambulatory Visit: Payer: Medicaid Other | Admitting: Family Medicine

## 2023-07-17 ENCOUNTER — Ambulatory Visit: Payer: Medicaid Other | Admitting: Licensed Practical Nurse

## 2023-07-17 VITALS — BP 103/78 | HR 110 | Wt 160.2 lb

## 2023-07-17 DIAGNOSIS — N939 Abnormal uterine and vaginal bleeding, unspecified: Secondary | ICD-10-CM

## 2023-07-17 NOTE — Progress Notes (Unsigned)
HPI:      [name] is a 27 y.o. [id number] who LMP was Patient's last menstrual period was 06/26/2023 (approximate)., presents today for a problem visit.  She complains of {irreg bleeding:16177} that  began {LOS Time; disease onset (duration):[phone]} and its severity is described as {severity:22726}.  She has {Regular/irregular menstrual period abdominal pain hpi md:30583} and they are associated with {no/mild/moderate/severe:19664} menstrual cramping.  She has used the following for attempts at control: {misc; menstrual control:12244}.  Previous evaluation: {Previous eval:60352}. Prior Diagnosis: {diagnoses; dysfunctional uterine bleeding:735}. Previous Treatment: {prev rx:318706}.  She is {sex active: 315163}.  Hx of STDs: {Diagnoses; std:14028}. She is {Menopause:31378}.  PMHx: She  has a past medical history of Angio-edema, BRCA negative (09/2020), Family history of adverse reaction to anesthesia, Family history of breast cancer (09/2020), Family history of pancreatic cancer, Family history of thyroid disease, GERD (gastroesophageal reflux disease), Migraine, and Urticaria. Also,  has a past surgical history that includes No past surgeries; Wisdom tooth extraction; Tonsillectomy (Bilateral, 09/05/2022); and Tonsillectomy., family history includes Allergic rhinitis in her brother and father; Allergies in her brother; Asthma in her maternal grandmother; Breast cancer (age of onset: 45) in her maternal aunt; Colon cancer (age of onset: 57) in her maternal grandfather; Colon cancer (age of onset: 35) in her paternal grandfather; Diabetes in her paternal grandfather and paternal grandmother; Hypertension in her maternal grandfather, maternal grandmother, paternal grandfather, and paternal grandmother; Migraines in her mother; Pancreatic cancer (age of onset: 50) in her maternal grandfather; Skin cancer in her father; Thyroid disease in her maternal aunt and maternal grandmother.,  reports that  she has never smoked. She has never been exposed to tobacco smoke. She has never used smokeless tobacco. She reports that she does not drink alcohol and does not use drugs.  She  Current Outpatient Medications:    Adapalene (DIFFERIN) 0.3 % gel, Apply to face qhs, wash off qam, Disp: 45 g, Rfl: 3   busPIRone (BUSPAR) 10 MG tablet, Take 1 tablet (10 mg total) by mouth 2 (two) times daily., Disp: 60 tablet, Rfl: 1   Clindamycin-Benzoyl Per, Refr, (DUAC) gel, Apply to face qam, wash off qhs, Disp: 45 g, Rfl: 3   escitalopram (LEXAPRO) 20 MG tablet, Take 1 tablet (20 mg total) by mouth daily. Dose increase, Disp: 90 tablet, Rfl: 0   famotidine (PEPCID) 20 MG tablet, Take 1 tablet (20 mg total) by mouth 2 (two) times daily., Disp: 30 tablet, Rfl: 5   tiZANidine (ZANAFLEX) 2 MG tablet, Take 1 tablet (2 mg total) by mouth at bedtime., Disp: 30 tablet, Rfl: 0   topiramate (TOPAMAX) 25 MG tablet, Take 3 tablets (75 mg total) by mouth at bedtime., Disp: 90 tablet, Rfl: 5   triamcinolone lotion (KENALOG) 0.1 %, Apply 1 Application topically 2 (two) times daily., Disp: 60 mL, Rfl: 0   valACYclovir (VALTREX) 1000 MG tablet, Take 1 tablet (1,000 mg total) by mouth daily., Disp: 90 tablet, Rfl: 2   Zavegepant HCl (ZAVZPRET) 10 MG/ACT SOLN, Place 1 spray into the nose daily as needed. Maximum 1 spray in 24 hours., Disp: 8 each, Rfl: 5   hydrOXYzine (VISTARIL) 25 MG capsule, TAKE 1-2 CAPSULES (25-50 MG TOTAL) BY MOUTH AT BEDTIME AS NEEDED. FOR ANXIETY AND SLEEP (Patient not taking: Reported on 07/17/2023), Disp: 180 capsule, Rfl: 0   levocetirizine (XYZAL) 5 MG tablet, Take 1 tablet (5 mg total) by mouth in the morning and at bedtime., Disp: 60 tablet, Rfl: 5  montelukast (SINGULAIR) 10 MG tablet, Take 1 tablet (10 mg total) by mouth at bedtime., Disp: 30 tablet, Rfl: 5   traZODone (DESYREL) 50 MG tablet, TAKE 1/2 TABLET BY MOUTH AT BEDTIME AS NEEDED FOR SLEEP (Patient not taking: Reported on 07/02/2023), Disp: 45  tablet, Rfl: 0  Also, is allergic to doxycycline, imitrex [sumatriptan], molds & smuts, pollen extract-tree extract [pollen extract], shrimp extract, and penicillins.  ROS see HPI   Objective: BP 103/78   Pulse (!) 110   Wt 160 lb 3.2 oz (72.7 kg)   LMP 06/26/2023 (Approximate)   BMI 27.69 kg/m  Physical Exam Constitutional:      Appearance: Normal appearance.  Pulmonary:     Effort: Pulmonary effort is normal.  Neurological:     Mental Status: She is alert.  Psychiatric:        Mood and Affect: Mood normal.     ASSESSMENT/PLAN:  Abnormal Uterine bleeding   Problem List Items Addressed This Visit   None   -discussed management for

## 2023-07-18 ENCOUNTER — Ambulatory Visit: Payer: Medicaid Other | Admitting: Family Medicine

## 2023-07-18 ENCOUNTER — Encounter: Payer: Self-pay | Admitting: Family Medicine

## 2023-07-18 VITALS — BP 118/78 | HR 98 | Ht 63.0 in | Wt 159.0 lb

## 2023-07-18 DIAGNOSIS — M9901 Segmental and somatic dysfunction of cervical region: Secondary | ICD-10-CM

## 2023-07-18 DIAGNOSIS — M9908 Segmental and somatic dysfunction of rib cage: Secondary | ICD-10-CM | POA: Diagnosis not present

## 2023-07-18 DIAGNOSIS — M9903 Segmental and somatic dysfunction of lumbar region: Secondary | ICD-10-CM | POA: Diagnosis not present

## 2023-07-18 DIAGNOSIS — M9902 Segmental and somatic dysfunction of thoracic region: Secondary | ICD-10-CM

## 2023-07-18 DIAGNOSIS — M9904 Segmental and somatic dysfunction of sacral region: Secondary | ICD-10-CM

## 2023-07-18 DIAGNOSIS — M62838 Other muscle spasm: Secondary | ICD-10-CM | POA: Diagnosis not present

## 2023-07-18 NOTE — Patient Instructions (Signed)
Good to see you  Like all the body guards 2 month follow up

## 2023-07-18 NOTE — Progress Notes (Signed)
[facility] [address] [place] [address] Phone: [phone] Subjective:    I'm seeing this patient by the request  of:  Alfredia Ferguson, PA-C  CC: back and neck pain   [id number]  Amanda [name] is a 27 y.o. female coming in with complaint of back and neck pain. OMT 05/17/2023. Patient states June she was experiencing r shoulder pain pain has resolved. Neck pain is intermittent   Medications patient has been prescribed: Zanaflex  Taking:  Started hormonal birth control yesterday     Recent pelvic ultrasound July showed no significant abnormality.   Reviewed prior external information including notes and imaging from previsou exam, outside providers and external EMR if available.   As well as notes that were available from care everywhere and other healthcare systems.  Past medical history, social, surgical and family history all reviewed in electronic medical record.  No pertanent information unless stated regarding to the chief complaint.   Past Medical History:  Diagnosis Date   Angio-edema    BRCA negative 09/2020   MyRisk neg except AXIN2 VUS   Family history of adverse reaction to anesthesia    heart condition   Family history of breast cancer 09/2020   IBIS=15.7%/riskscore=14.7%   Family history of pancreatic cancer    Family history of thyroid disease    GERD (gastroesophageal reflux disease)    Migraine    Urticaria     Allergies  Allergen Reactions   Doxycycline Other (See Comments)    Exacerbates Migraines   Imitrex [Sumatriptan] Swelling    Facial swelling and burning lips   Molds & Smuts    Pollen Extract-Tree Extract [Pollen Extract]    Shrimp Extract    Penicillins Hives    Did it involve swelling of the face/tongue/throat, SOB, or low BP? No Did it involve sudden or severe rash/hives, skin peeling, or any reaction on the inside of your mouth or nose? Yes Did you need to seek medical attention at a  hospital or doctor's office? Yes When did it last happen?      childhood allergy If all above answers are "NO", may proceed with cephalosporin use.     Review of Systems:  No headache, visual changes, nausea, vomiting, diarrhea, constipation, dizziness, abdominal pain, skin rash, fevers, chills, night sweats, weight loss, swollen lymph nodes, body aches, joint swelling, chest pain, shortness of breath, mood changes. POSITIVE muscle aches  Objective  Blood pressure 118/78, pulse 98, height 5\' 3"  (1.6 m), weight 159 lb (72.1 kg), last menstrual period 06/26/2023, SpO2 99%.   General: No apparent distress alert and oriented x3 mood and affect normal, dressed appropriately.  HEENT: Pupils equal, extraocular movements intact  Respiratory: Patient's speak in full sentences and does not appear short of breath  Cardiovascular: No lower extremity edema, non tender, no erythema  Gait MSK:  Back does have some loss of lordosis noted.  Patient does have some tightness noted in the parascapular area.  No significant tenderness at the occipital region right side.  Osteopathic findings  C2 flexed rotated and side bent right C6 flexed rotated and side bent left T5 extended rotated and side bent right inhaled rib T8 extended rotated and side bent left L2 flexed rotated and side bent right Sacrum right on right       Assessment and Plan:  No problem-specific Assessment & Plan notes found for this encounter.    Nonallopathic problems  Decision today to treat with OMT  was based on Physical Exam  After verbal consent patient was treated with HVLA, ME, FPR techniques in cervical, rib, thoracic, lumbar, and sacral  areas  Patient tolerated the procedure well with improvement in symptoms  Patient given exercises, stretches and lifestyle modifications  See medications in patient instructions if given  Patient will follow up in 4-8 weeks     The above documentation has been reviewed and  is accurate and complete Judi Saa, DO         Note: This dictation was prepared with Dragon dictation along with smaller phrase technology. Any transcriptional errors that result from this process are unintentional.

## 2023-07-18 NOTE — Assessment & Plan Note (Signed)
Has had some difficulty but overall doing okay with a conservative approach.  We discussed that muscle relaxers are a possibility if needed.  Increase activity slowly otherwise.  Follow-up with me again in 6 to 8 weeks

## 2023-07-19 DIAGNOSIS — N939 Abnormal uterine and vaginal bleeding, unspecified: Secondary | ICD-10-CM | POA: Insufficient documentation

## 2023-07-26 ENCOUNTER — Other Ambulatory Visit: Payer: Self-pay | Admitting: Psychiatry

## 2023-07-26 DIAGNOSIS — F411 Generalized anxiety disorder: Secondary | ICD-10-CM

## 2023-08-02 ENCOUNTER — Encounter: Payer: Self-pay | Admitting: Allergy & Immunology

## 2023-08-02 ENCOUNTER — Other Ambulatory Visit: Payer: Self-pay

## 2023-08-02 ENCOUNTER — Ambulatory Visit (INDEPENDENT_AMBULATORY_CARE_PROVIDER_SITE_OTHER): Payer: Medicaid Other | Admitting: Allergy & Immunology

## 2023-08-02 DIAGNOSIS — T7800XD Anaphylactic reaction due to unspecified food, subsequent encounter: Secondary | ICD-10-CM

## 2023-08-02 DIAGNOSIS — J3089 Other allergic rhinitis: Secondary | ICD-10-CM | POA: Diagnosis not present

## 2023-08-02 DIAGNOSIS — L508 Other urticaria: Secondary | ICD-10-CM | POA: Diagnosis not present

## 2023-08-02 DIAGNOSIS — J302 Other seasonal allergic rhinitis: Secondary | ICD-10-CM | POA: Diagnosis not present

## 2023-08-02 MED ORDER — LEVOCETIRIZINE DIHYDROCHLORIDE 5 MG PO TABS: 5.0000 mg | ORAL_TABLET | Freq: Two times a day (BID) | ORAL | 5 refills | Status: DC

## 2023-08-02 MED ORDER — MONTELUKAST SODIUM 10 MG PO TABS: 10.0000 mg | ORAL_TABLET | Freq: Every day | ORAL | 5 refills | Status: DC

## 2023-08-02 NOTE — Progress Notes (Signed)
RE: Amanda [name] MRN: [id number] DOB: 1996-01-01 Date of Telemedicine Visit: 08/02/2023  Referring provider: Alfredia Ferguson, PA-C Primary care provider: Alfredia Ferguson, PA-C  Chief Complaint: Urticaria and Allergic Rhinitis    Telemedicine Follow Up Visit via Telephone: I connected with [name] for a follow up on 08/02/23 by telephone and verified that I am speaking with the correct person using two identifiers.   I discussed the limitations, risks, security and privacy concerns of performing an evaluation and management service by telephone and the availability of in person appointments. I also discussed with the patient that there may be a patient responsible charge related to this service. The patient expressed understanding and agreed to proceed.  Patient is at home. Provider is at the office.  Visit start time: 8:40 AM Visit end time: 8:55 AM Insurance consent/check in by: [facility] Medical consent and medical assistant/nurse: Dr. Reece Agar  History of Present Illness:  She is a 27 y.o. female, who is being followed for cardiology colitis as well as autoimmune urticaria. Her previous allergy office visit was in July 2024 with myself. She was last seen in June 2024 for new patient evaluation for chronic urticaria and environmental allergies.  We started her on Xyzal and Pepcid twice daily.  We obtained labs.  She had skin testing that was positive to multiple indoor and outdoor allergens.  Labs were notable for slight reactivity to shrimp.  She also had an elevated chronic urticaria assay, indicating autoimmune urticaria.  Since last visit, she has been well.  Allergic Rhinitis Symptom History: She did forget to take the allergy medications once and this was bad news bears. She goes two times per month. She is there for an entire day 4-6 hours.  These allergy pills have been very helpful with helping her control her symptoms and she is doing her housecleaning.  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  Skin Symptom Symptom History: Hives are under good control with Xyzal and Pepcid BID. She is not on the Xolair. We did have to add the montelukast 10mg  at night. She has no hives on that at all. She is not experiencing anything regarding hives or sleepiness.   She remains on all of her medications for her anxiety and depression. She is also on the trazadone for sleep. She has migraines and is on a number of treatment plans for that.   She sees Dr. Corliss Skains for Raynauds and elevated ANA. She gets retested annually to make sure that everything stays.  Otherwise, there have been no changes to her past medical history, surgical history, family history, or social history.  Assessment and Plan:  [name] is a 26 y.o. female with:  Chronic autoimmune urticaria - doing well on   History of elevated ANA - follows with Dr. Corliss Skains   Seasonal and perennial allergic rhinitis (weeds, trees, indoor molds, outdoor molds, dust mites, cat, cockroach, and horse)  Food allergy - with slightly elevated IgE to shrimp   We are going to try decreasing her medications today to see how she does.  I do not think she needs to go to Xolair at this point since she is under good control symptomatically.  She overall just does not like taking medications, so we are going to start Pepcid twice a day to see if this makes any difference.  Her levocetirizine and montelukast are helping with her environmental allergies, so she is fine with continuing with those medications.  We did talk about  autoimmune disease in general and she does follow with rheumatology, so she is well aware of the concept of autoimmunity.  I do hope that we can continue to wean her medication time.  We did discuss the normal time course of chronic urticaria and the fact that it typically resolves in 6 to 12 months.   Diagnostics: None.  Medication List:  Current Outpatient Medications  Medication Sig  Dispense Refill   Adapalene (DIFFERIN) 0.3 % gel Apply to face qhs, wash off qam 45 g 3   busPIRone (BUSPAR) 10 MG tablet TAKE 1 TABLET BY MOUTH TWICE A DAY 180 tablet 0   Clindamycin-Benzoyl Per, Refr, (DUAC) gel Apply to face qam, wash off qhs 45 g 3   escitalopram (LEXAPRO) 20 MG tablet Take 1 tablet (20 mg total) by mouth daily. Dose increase 90 tablet 0   hydrOXYzine (VISTARIL) 25 MG capsule TAKE 1-2 CAPSULES (25-50 MG TOTAL) BY MOUTH AT BEDTIME AS NEEDED. FOR ANXIETY AND SLEEP 180 capsule 0   levocetirizine (XYZAL) 5 MG tablet Take 1 tablet (5 mg total) by mouth in the morning and at bedtime. 60 tablet 5   montelukast (SINGULAIR) 10 MG tablet Take 1 tablet (10 mg total) by mouth at bedtime. 30 tablet 5   tiZANidine (ZANAFLEX) 2 MG tablet Take 1 tablet (2 mg total) by mouth at bedtime. 30 tablet 0   topiramate (TOPAMAX) 25 MG tablet Take 3 tablets (75 mg total) by mouth at bedtime. 90 tablet 5   traZODone (DESYREL) 50 MG tablet TAKE 1/2 TABLET BY MOUTH AT BEDTIME AS NEEDED FOR SLEEP 45 tablet 0   triamcinolone lotion (KENALOG) 0.1 % Apply 1 Application topically 2 (two) times daily. 60 mL 0   valACYclovir (VALTREX) 1000 MG tablet Take 1 tablet (1,000 mg total) by mouth daily. 90 tablet 2   Zavegepant HCl (ZAVZPRET) 10 MG/ACT SOLN Place 1 spray into the nose daily as needed. Maximum 1 spray in 24 hours. 8 each 5   No current facility-administered medications for this visit.   Allergies: Allergies  Allergen Reactions   Doxycycline Other (See Comments)    Exacerbates Migraines   Imitrex [Sumatriptan] Swelling    Facial swelling and burning lips   Molds & Smuts    Pollen Extract-Tree Extract [Pollen Extract]    Shrimp Extract    Penicillins Hives    Did it involve swelling of the face/tongue/throat, SOB, or low BP? No Did it involve sudden or severe rash/hives, skin peeling, or any reaction on the inside of your mouth or nose? Yes Did you need to seek medical attention at a hospital or  doctor's office? Yes When did it last happen?      childhood allergy If all above answers are "NO", may proceed with cephalosporin use.   I reviewed her past medical history, social history, family history, and environmental history and no significant changes have been reported from previous visits.  Review of Systems  Constitutional:  Negative for activity change, appetite change, chills, diaphoresis, fatigue and fever.  HENT:  Negative for congestion, postnasal drip, rhinorrhea, sinus pressure and sore throat.   Eyes:  Negative for pain, discharge, redness and itching.  Respiratory:  Negative for shortness of breath, wheezing and stridor.   Gastrointestinal:  Negative for diarrhea, nausea and vomiting.  Endocrine: Negative for cold intolerance and heat intolerance.  Musculoskeletal:  Negative for arthralgias, joint swelling and myalgias.  Skin:  Negative for rash.  Allergic/Immunologic: Positive for environmental allergies. Negative for food  allergies.    Objective:  Physical exam not obtained as encounter was done via telephone.   Previous notes and tests were reviewed.  I discussed the assessment and treatment plan with the patient. The patient was provided an opportunity to ask questions and all were answered. The patient agreed with the plan and demonstrated an understanding of the instructions.   The patient was advised to call back or seek an in-person evaluation if the symptoms worsen or if the condition fails to improve as anticipated.  I provided 15 minutes of non-face-to-face time during this encounter.  It was my pleasure to participate in [place] Lofaso's care today. Please feel free to contact me with any questions or concerns.   Sincerely,  Alfonse Spruce, MD

## 2023-08-15 ENCOUNTER — Ambulatory Visit: Payer: Medicaid Other | Admitting: Family Medicine

## 2023-08-18 ENCOUNTER — Other Ambulatory Visit: Payer: Self-pay

## 2023-08-18 ENCOUNTER — Emergency Department
Admission: EM | Admit: 2023-08-18 | Discharge: 2023-08-18 | Disposition: A | Discharge status: Home / Self Care | Payer: Medicaid Other | Source: Home / Self Care | Attending: Emergency Medicine | Admitting: Emergency Medicine

## 2023-08-18 ENCOUNTER — Emergency Department: Payer: Medicaid Other

## 2023-08-18 DIAGNOSIS — R55 Syncope and collapse: Secondary | ICD-10-CM | POA: Diagnosis present

## 2023-08-18 LAB — CBC
HCT: 36.6 % (ref 36.0–46.0)
Hemoglobin: 12.4 g/dL (ref 12.0–15.0)
MCH: 29.9 pg (ref 26.0–34.0)
MCHC: 33.9 g/dL (ref 30.0–36.0)
MCV: 88.2 fL (ref 80.0–100.0)
Platelets: 251 10*3/uL (ref 150–400)
RBC: 4.15 MIL/uL (ref 3.87–5.11)
RDW: 11.6 % (ref 11.5–15.5)
WBC: 10.2 10*3/uL (ref 4.0–10.5)
nRBC: 0 % (ref 0.0–0.2)

## 2023-08-18 LAB — HEPATIC FUNCTION PANEL
ALT: 9 U/L (ref 0–44)
AST: 23 U/L (ref 15–41)
Albumin: 3.9 g/dL (ref 3.5–5.0)
Alkaline Phosphatase: 49 U/L (ref 38–126)
Bilirubin, Direct: 0.2 mg/dL (ref 0.0–0.2)
Indirect Bilirubin: 0.8 mg/dL (ref 0.3–0.9)
Total Bilirubin: 1 mg/dL (ref 0.3–1.2)
Total Protein: 7 g/dL (ref 6.5–8.1)

## 2023-08-18 LAB — PREGNANCY, URINE: Preg Test, Ur: NEGATIVE

## 2023-08-18 LAB — URINALYSIS, ROUTINE W REFLEX MICROSCOPIC
Bilirubin Urine: NEGATIVE
Glucose, UA: NEGATIVE mg/dL
Hgb urine dipstick: NEGATIVE
Ketones, ur: NEGATIVE mg/dL
Nitrite: NEGATIVE
Protein, ur: NEGATIVE mg/dL
Specific Gravity, Urine: 1.02 (ref 1.005–1.030)
pH: 6.5 (ref 5.0–8.0)

## 2023-08-18 LAB — BASIC METABOLIC PANEL
Anion gap: 10 (ref 5–15)
BUN: 14 mg/dL (ref 6–20)
CO2: 19 mmol/L — ABNORMAL LOW (ref 22–32)
Calcium: 8.6 mg/dL — ABNORMAL LOW (ref 8.9–10.3)
Chloride: 110 mmol/L (ref 98–111)
Creatinine, Ser: 0.88 mg/dL (ref 0.44–1.00)
GFR, Estimated: >60 mL/min (ref >60)
Glucose, Bld: 98 mg/dL (ref 70–99)
Potassium: 2.9 mmol/L — ABNORMAL LOW (ref 3.5–5.1)
Sodium: 139 mmol/L (ref 135–145)

## 2023-08-18 LAB — CK: Total CK: 46 U/L (ref 38–234)

## 2023-08-18 LAB — LIPASE, BLOOD: Lipase: 36 U/L (ref 11–51)

## 2023-08-18 MED ORDER — SODIUM CHLORIDE 0.9 % IV BOLUS
1000.0000 mL | Freq: Once | INTRAVENOUS | Status: AC
  Administered 2023-08-18: 1000 mL via INTRAVENOUS

## 2023-08-18 MED ORDER — POTASSIUM CHLORIDE CRYS ER 20 MEQ PO TBCR
40.0000 meq | EXTENDED_RELEASE_TABLET | Freq: Once | ORAL | Status: AC
  Administered 2023-08-18: 40 meq via ORAL
  Filled 2023-08-18: qty 2

## 2023-08-18 NOTE — ED Triage Notes (Signed)
Pt here via EMS for heat stroke- pt states she was outside since 9 am taking photos at a game- pt passed out multiple times, was cooled down with ice and given liquid IV by EMS- Pt c/o soreness from muscle cramps. Pt AOX4, NAD noted. Pt denies pain from injury.

## 2023-08-18 NOTE — ED Notes (Signed)
First Nurse Note: Pt to ED via [facility] [place]. Per EMS pt had a syncopal episode. Pt has been out on the football field all day taking pictures and reports that she has not had anything to drink. EMS gave pt 500 mL of fluids in route. VSS with EMS.

## 2023-08-18 NOTE — ED Provider Notes (Signed)
[facility] Provider Note  Patient Contact: 4:42 PM (approximate)   History   Loss of Consciousness   HPI  Amanda [name] is a 27 y.o. female with a history of migraines and GERD as well as generalized anxiety, presents to the emergency department after patient reportedly had an episode of syncope.  Patient was taking pictures at a sporting event that started around 9 AM.  Husband reports that when patient would come to, it would seem like she was hyperventilating and would pass out again.  Patient reports that she did notice that prior to syncope she was dizzy when she would "squat and stand up.  She states that she is experienced syncope in the past with blood draws.  She denies possibility of pregnancy.  No headache.  No chest pain, chest tightness or abdominal pain.      Physical Exam   Triage Vital Signs: ED Triage Vitals  Encounter Vitals Group     BP 08/18/23 1452 107/75     Systolic BP Percentile --      Diastolic BP Percentile --      Pulse Rate 08/18/23 1452 96     Resp 08/18/23 1452 16     Temp 08/18/23 1452 98.5 F (36.9 C)     Temp Source 08/18/23 1452 Oral     SpO2 08/18/23 1452 100 %     Weight --      Height --      Head Circumference --      Peak Flow --      Pain Score 08/18/23 1505 4     Pain Loc --      Pain Education --      Exclude from Growth Chart --     Most recent vital signs: Vitals:   08/18/23 1452  BP: 107/75  Pulse: 96  Resp: 16  Temp: 98.5 F (36.9 C)  SpO2: 100%     General: Alert and in no acute distress. Eyes:  PERRL. EOMI. Head: No acute traumatic findings ENT:      Nose: No congestion/rhinnorhea.      Mouth/Throat: Mucous membranes are moist. Neck: No stridor. No cervical spine tenderness to palpation. Cardiovascular:  Good peripheral perfusion Respiratory: Normal respiratory effort without tachypnea or retractions. Lungs CTAB. Good air entry to the bases with no decreased or absent breath  sounds. Gastrointestinal: Bowel sounds 4 quadrants. Soft and nontender to palpation. No guarding or rigidity. No palpable masses. No distention. No CVA tenderness. Musculoskeletal: Full range of motion to all extremities.  Neurologic:  No gross focal neurologic deficits are appreciated.  Skin:   No rash noted    ED Results / Procedures / Treatments   Labs (all labs ordered are listed, but only abnormal results are displayed) Labs Reviewed  BASIC METABOLIC PANEL - Abnormal; Notable for the following components:      Result Value   Potassium 2.9 (*)    CO2 19 (*)    Calcium 8.6 (*)    All other components within normal limits  URINALYSIS, ROUTINE W REFLEX MICROSCOPIC - Abnormal; Notable for the following components:   Color, Urine YELLOW (*)    Leukocytes,Ua TRACE (*)    Bacteria, UA RARE (*)    All other components within normal limits  CBC  CK  HEPATIC FUNCTION PANEL  LIPASE, BLOOD  PREGNANCY, URINE  POC URINE PREG, ED  CBG MONITORING, ED     EKG  Normal sinus rhythm without ST  segment elevation or other apparent arrhythmia.  RADIOLOGY  I personally viewed and evaluated these images as part of my medical decision making, as well as reviewing the written report by the radiologist.  ED Provider Interpretation: CT head unremarkable.   PROCEDURES:  Critical Care performed: No  Procedures   MEDICATIONS ORDERED IN ED: Medications  potassium chloride SA (KLOR-CON M) CR tablet 40 mEq (40 mEq Oral Given 08/18/23 1651)  sodium chloride 0.9 % bolus 1,000 mL (0 mLs Intravenous Stopped 08/18/23 1822)     IMPRESSION / MDM / ASSESSMENT AND PLAN / ED COURSE  I reviewed the triage vital signs and the nursing notes.                              Assessment and plan: Syncope:   27 year old female presents to the emergency department after an episode of syncope that occurred after patient had been in the heat for a prolonged period of time and patient had spent time  squatting and standing.  Vital signs were reassuring at triage.  On exam, patient alert, active and nontoxic-appearing.  CBC reassuring.  BMP with mild hypokalemia.  Urinalysis with no signs of UTI.  Urine pregnancy test was negative.  CK within range.  EKG indicates normal sinus rhythm without ST segment elevation or other apparent arrhythmia.  CT head without acute abnormality.  After normal saline bolus, patient was ambulatory from exam room to bathroom and felt overall improved.  I recommended increase hydration at home and transitioning from a squatting position to a standing position slowly.  Also recommended supplemental electrolyte hydration when being outside for prolonged period of time.  Return precautions were given to return with new or worsening symptoms.  All patient questions were answered.   FINAL CLINICAL IMPRESSION(S) / ED DIAGNOSES   Final diagnoses:  Syncope, unspecified syncope type     Rx / DC Orders   ED Discharge Orders     None        Note:  This document was prepared using Dragon voice recognition software and may include unintentional dictation errors.   Pia Mau Mansfield, PA-C 08/18/23 1911    Sharman Cheek, MD 08/21/23 1534

## 2023-08-27 ENCOUNTER — Encounter: Payer: Self-pay | Admitting: Psychiatry

## 2023-08-27 ENCOUNTER — Telehealth (INDEPENDENT_AMBULATORY_CARE_PROVIDER_SITE_OTHER): Payer: Medicaid Other | Admitting: Psychiatry

## 2023-08-27 DIAGNOSIS — G4701 Insomnia due to medical condition: Secondary | ICD-10-CM

## 2023-08-27 DIAGNOSIS — F3342 Major depressive disorder, recurrent, in full remission: Secondary | ICD-10-CM | POA: Diagnosis not present

## 2023-08-27 DIAGNOSIS — F411 Generalized anxiety disorder: Secondary | ICD-10-CM

## 2023-08-27 MED ORDER — BUSPIRONE HCL 10 MG PO TABS
20.0000 mg | ORAL_TABLET | Freq: Two times a day (BID) | ORAL | 1 refills | Status: DC
Start: 2023-08-27 — End: 2023-09-24
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
## 2023-09-02 ENCOUNTER — Encounter: Payer: Self-pay | Admitting: Family Medicine

## 2023-09-04 NOTE — Progress Notes (Unsigned)
[facility] [address] [place] [address] Phone: [phone] Subjective:   Bruce Donath, am serving as a scribe for Dr. Antoine Primas.  I'm seeing this patient by the request  of:  Alfredia Ferguson, PA-C  CC: Low back and neck pain follow-up  [id number]  Amanda [name] is a 27 y.o. female coming in with complaint of back and neck pain. OMT on 07/18/2023. Patient states that she had sharp shoulder pain on Sunday. Flexion, abduction, IR. Pain can be sharp. Insidious onset. Denies any radiating symptoms.   Medications patient has been prescribed:   Taking:         Reviewed prior external information including notes and imaging from previsou exam, outside providers and external EMR if available.   As well as notes that were available from care everywhere and other healthcare systems.  Past medical history, social, surgical and family history all reviewed in electronic medical record.  No pertanent information unless stated regarding to the chief complaint.   Past Medical History:  Diagnosis Date   Angio-edema    BRCA negative 09/2020   MyRisk neg except AXIN2 VUS   Family history of adverse reaction to anesthesia    heart condition   Family history of breast cancer 09/2020   IBIS=15.7%/riskscore=14.7%   Family history of pancreatic cancer    Family history of thyroid disease    GERD (gastroesophageal reflux disease)    Migraine    Urticaria     Allergies  Allergen Reactions   Doxycycline Other (See Comments)    Exacerbates Migraines   Imitrex [Sumatriptan] Swelling    Facial swelling and burning lips   Molds & Smuts    Pollen Extract-Tree Extract [Pollen Extract]    Shrimp Extract    Penicillins Hives    Did it involve swelling of the face/tongue/throat, SOB, or low BP? No Did it involve sudden or severe rash/hives, skin peeling, or any reaction on the inside of your mouth or nose? Yes Did you need to seek medical  attention at a hospital or doctor's office? Yes When did it last happen?      childhood allergy If all above answers are "NO", may proceed with cephalosporin use.     Review of Systems:  No headache, visual changes, nausea, vomiting, diarrhea, constipation, dizziness, abdominal pain, skin rash, fevers, chills, night sweats, weight loss, swollen lymph nodes, body aches, joint swelling, chest pain, shortness of breath, mood changes. POSITIVE muscle aches  Objective  Blood pressure 100/64, pulse 93, height 5\' 3"  (1.6 m), weight 161 lb (73 kg), last menstrual period 07/19/2023, SpO2 98%.   General: No apparent distress alert and oriented x3 mood and affect normal, dressed appropriately.  HEENT: Pupils equal, extraocular movements intact  Respiratory: Patient's speak in full sentences and does not appear short of breath  Cardiovascular: No lower extremity edema, non tender, no erythema  Patient does have some impingement of the acromioclavicular joint on the left side.  Patient does have some scapular dyskinesis noted as well.  Osteopathic findings  C2 flexed rotated and side bent right C6 flexed rotated and side bent left T3 extended rotated and side bent left  inhaled rib T9 extended rotated and side bent left L3 flexed rotated and side bent right Sacrum right on right       Assessment and Plan:  Neck pain Seems to be multifactorial.  Tightness in the neck that certain activities.  Discussed icing regimen and home exercises,  which activities to do and which ones to avoid.  Increase activity slowly over the course of next several weeks.  Follow-up again in 6 to 8 weeks. y  [facility] joint pain Left-sided acromioclavicular pain.  Discussed icing regimen.  Discussed keeping hands within peripheral vision, increase activity slowly.  Follow-up again in 6 to 8 weeks.    Nonallopathic problems  Decision today to treat with OMT was based on Physical Exam  After verbal consent patient was  treated with HVLA, ME, FPR techniques in cervical, rib, thoracic, lumbar, and sacral  areas  Patient tolerated the procedure well with improvement in symptoms  Patient given exercises, stretches and lifestyle modifications  See medications in patient instructions if given  Patient will follow up in 4-8 weeks     The above documentation has been reviewed and is accurate and complete Judi Saa, DO         Note: This dictation was prepared with Dragon dictation along with smaller phrase technology. Any transcriptional errors that result from this process are unintentional.

## 2023-09-05 ENCOUNTER — Encounter: Payer: Self-pay | Admitting: Family Medicine

## 2023-09-05 ENCOUNTER — Ambulatory Visit: Payer: Medicaid Other | Admitting: Family Medicine

## 2023-09-05 VITALS — BP 100/64 | HR 93 | Ht 63.0 in | Wt 161.0 lb

## 2023-09-05 DIAGNOSIS — M9904 Segmental and somatic dysfunction of sacral region: Secondary | ICD-10-CM

## 2023-09-05 DIAGNOSIS — M9902 Segmental and somatic dysfunction of thoracic region: Secondary | ICD-10-CM

## 2023-09-05 DIAGNOSIS — M25512 Pain in left shoulder: Secondary | ICD-10-CM

## 2023-09-05 DIAGNOSIS — M25519 Pain in unspecified shoulder: Secondary | ICD-10-CM | POA: Insufficient documentation

## 2023-09-05 DIAGNOSIS — M9908 Segmental and somatic dysfunction of rib cage: Secondary | ICD-10-CM

## 2023-09-05 DIAGNOSIS — M9901 Segmental and somatic dysfunction of cervical region: Secondary | ICD-10-CM

## 2023-09-05 DIAGNOSIS — M9903 Segmental and somatic dysfunction of lumbar region: Secondary | ICD-10-CM

## 2023-09-05 DIAGNOSIS — M542 Cervicalgia: Secondary | ICD-10-CM | POA: Diagnosis not present

## 2023-09-05 NOTE — Patient Instructions (Signed)
AC joint pain Ice and Voltaren Keep hands within peripheral vision See you again in 2-3 months

## 2023-09-05 NOTE — Assessment & Plan Note (Signed)
Left-sided acromioclavicular pain.  Discussed icing regimen.  Discussed keeping hands within peripheral vision, increase activity slowly.  Follow-up again in 6 to 8 weeks.

## 2023-09-05 NOTE — Assessment & Plan Note (Signed)
Seems to be multifactorial.  Tightness in the neck that certain activities.  Discussed icing regimen and home exercises, which activities to do and which ones to avoid.  Increase activity slowly over the course of next several weeks.  Follow-up again in 6 to 8 weeks. y

## 2023-09-18 ENCOUNTER — Encounter: Payer: Self-pay | Admitting: Physician Assistant

## 2023-09-18 ENCOUNTER — Ambulatory Visit (INDEPENDENT_AMBULATORY_CARE_PROVIDER_SITE_OTHER): Payer: Medicaid Other | Admitting: Physician Assistant

## 2023-09-18 ENCOUNTER — Other Ambulatory Visit: Payer: Self-pay

## 2023-09-18 VITALS — BP 122/62 | HR 92 | Temp 98.4°F | Resp 16 | Ht 63.0 in | Wt 160.2 lb

## 2023-09-18 DIAGNOSIS — N939 Abnormal uterine and vaginal bleeding, unspecified: Secondary | ICD-10-CM | POA: Diagnosis not present

## 2023-09-18 DIAGNOSIS — L700 Acne vulgaris: Secondary | ICD-10-CM

## 2023-09-18 DIAGNOSIS — N6452 Nipple discharge: Secondary | ICD-10-CM | POA: Diagnosis not present

## 2023-09-18 NOTE — Progress Notes (Signed)
Established patient visit   Patient: Amanda [name]   DOB: [birth year]   27 y.o. Female  MRN: [id number] Visit Date: 09/18/2023  Today's healthcare provider: Alfredia Ferguson, PA-C   Chief Complaint  Patient presents with   Breast Problem    For several weeks- noticed breast growing in size,  On Friday 9/27- pain and started producing breast milk Stopped breastfeeding 3 years ago- only did that 2 weeks Pt's fiance had vasectomy- 3 years ago, never went back for testing    Acne   Subjective    HPI Pt is an established patient of mine from [facility]. This is her first visit at [facility].  Pt reports noticing tender, swollen breasts for the last two weeks, she squeezed one and saw what looked like breast milk. She has not breastfed for 3 years. She denies having discharge without squeezing. Patient's last menstrual period was 08/21/2023 (approximate).    Medications: Outpatient Medications Prior to Visit  Medication Sig   Adapalene (DIFFERIN) 0.3 % gel Apply to face qhs, wash off qam   busPIRone (BUSPAR) 10 MG tablet Take 2 tablets (20 mg total) by mouth 2 (two) times daily.   Clindamycin-Benzoyl Per, Refr, (DUAC) gel Apply to face qam, wash off qhs   escitalopram (LEXAPRO) 20 MG tablet Take 1 tablet (20 mg total) by mouth daily. Dose increase   hydrOXYzine (VISTARIL) 25 MG capsule TAKE 1-2 CAPSULES (25-50 MG TOTAL) BY MOUTH AT BEDTIME AS NEEDED. FOR ANXIETY AND SLEEP   levocetirizine (XYZAL) 5 MG tablet Take 1 tablet (5 mg total) by mouth in the morning and at bedtime.   montelukast (SINGULAIR) 10 MG tablet Take 1 tablet (10 mg total) by mouth at bedtime.   tiZANidine (ZANAFLEX) 2 MG tablet Take 1 tablet (2 mg total) by mouth at bedtime.   topiramate (TOPAMAX) 25 MG tablet Take 3 tablets (75 mg total) by mouth at bedtime.   traZODone (DESYREL) 50 MG tablet TAKE 1/2 TABLET BY MOUTH AT BEDTIME AS NEEDED FOR SLEEP   triamcinolone lotion (KENALOG) 0.1 %  Apply 1 Application topically 2 (two) times daily.   valACYclovir (VALTREX) 1000 MG tablet Take 1 tablet (1,000 mg total) by mouth daily.   Zavegepant HCl (ZAVZPRET) 10 MG/ACT SOLN Place 1 spray into the nose daily as needed. Maximum 1 spray in 24 hours.   [DISCONTINUED] dicyclomine (BENTYL) 20 MG tablet Take 1 tablet (20 mg total) by mouth 2 (two) times daily. (Patient not taking: No sig reported)   No facility-administered medications prior to visit.    Review of Systems  Constitutional:  Negative for fatigue and fever.  Respiratory:  Negative for cough and shortness of breath.   Cardiovascular:  Negative for chest pain and leg swelling.  Gastrointestinal:  Negative for abdominal pain.  Neurological:  Negative for dizziness and headaches.      Objective    BP 122/62   Pulse 92   Temp 98.4 F (36.9 C) (Oral)   Resp 16   Ht 5\' 3"  (1.6 m)   Wt 160 lb 4 oz (72.7 kg)   LMP 08/21/2023 (Approximate)   SpO2 98%   BMI 28.39 kg/m   Physical Exam Vitals reviewed.  Constitutional:      Appearance: She is not ill-appearing.  HENT:     Head: Normocephalic.  Eyes:     Conjunctiva/sclera: Conjunctivae normal.  Cardiovascular:     Rate and Rhythm: Normal rate.  Pulmonary:  Effort: Pulmonary effort is normal. No respiratory distress.  Chest:     Comments: B/l breasts without masses, skin thickening.  No nipple discharge. On light expression no discharge. On patient expression--forceful, very small multiductal drops, clear fluid. Neurological:     General: No focal deficit present.     Mental Status: She is alert and oriented to person, place, and time.  Psychiatric:        Mood and Affect: Mood normal.        Behavior: Behavior normal.      No results found for any visits on 09/18/23.  Assessment & Plan     1. Acne vulgaris Per pt wants a second opinion - Ambulatory referral to Dermatology  2. Abnormal uterine bleeding Has resolved, but pt wants a  new gyn -  Ambulatory referral to Gynecology  3. Nipple discharge Normal physiologic discharge. Advised pt not to squeeze     I, Alfredia Ferguson, PA-C have reviewed all documentation for this visit. The documentation on  09/18/23   for the exam, diagnosis, procedures, and orders are all accurate and complete.   Alfredia Ferguson, PA-C  [facility] Primary Care at [facility] [phone] (phone) [phone] (fax)  [facility]

## 2023-09-18 NOTE — Patient Instructions (Addendum)
[email or web address]  [phone]

## 2023-09-19 ENCOUNTER — Ambulatory Visit: Payer: Medicaid Other | Admitting: Family Medicine

## 2023-09-22 ENCOUNTER — Other Ambulatory Visit: Payer: Self-pay | Admitting: Psychiatry

## 2023-09-22 DIAGNOSIS — F411 Generalized anxiety disorder: Secondary | ICD-10-CM

## 2023-09-24 ENCOUNTER — Telehealth: Payer: Medicaid Other | Admitting: Psychiatry

## 2023-09-25 NOTE — Progress Notes (Deleted)
Established patient visit   Patient: Amanda [name]   DOB: Jul 22, 1996   27 y.o. Female  MRN: [id number] Visit Date: 09/26/2023  Today's healthcare provider: Alfredia Ferguson, PA-C   No chief complaint on file.  Subjective    HPI  ***  Medications: Outpatient Medications Prior to Visit  Medication Sig   Adapalene (DIFFERIN) 0.3 % gel Apply to face qhs, wash off qam   busPIRone (BUSPAR) 10 MG tablet TAKE 2 TABLETS BY MOUTH 2 TIMES DAILY.   Clindamycin-Benzoyl Per, Refr, (DUAC) gel Apply to face qam, wash off qhs   escitalopram (LEXAPRO) 20 MG tablet Take 1 tablet (20 mg total) by mouth daily. Dose increase   hydrOXYzine (VISTARIL) 25 MG capsule TAKE 1-2 CAPSULES (25-50 MG TOTAL) BY MOUTH AT BEDTIME AS NEEDED. FOR ANXIETY AND SLEEP   levocetirizine (XYZAL) 5 MG tablet Take 1 tablet (5 mg total) by mouth in the morning and at bedtime.   montelukast (SINGULAIR) 10 MG tablet Take 1 tablet (10 mg total) by mouth at bedtime.   tiZANidine (ZANAFLEX) 2 MG tablet Take 1 tablet (2 mg total) by mouth at bedtime.   topiramate (TOPAMAX) 25 MG tablet Take 3 tablets (75 mg total) by mouth at bedtime.   traZODone (DESYREL) 50 MG tablet TAKE 1/2 TABLET BY MOUTH AT BEDTIME AS NEEDED FOR SLEEP   triamcinolone lotion (KENALOG) 0.1 % Apply 1 Application topically 2 (two) times daily.   valACYclovir (VALTREX) 1000 MG tablet Take 1 tablet (1,000 mg total) by mouth daily.   Zavegepant HCl (ZAVZPRET) 10 MG/ACT SOLN Place 1 spray into the nose daily as needed. Maximum 1 spray in 24 hours.   No facility-administered medications prior to visit.    Review of Systems {Insert previous labs (optional):23779} {See past labs  Heme  Chem  Endocrine  Serology  Results Review (optional):1}   Objective    LMP 08/21/2023 (Approximate)  {Insert last BP/Wt (optional):23777}{See vitals history (optional):1}  Physical Exam  ***  No results found for any visits on 09/26/23.  Assessment & Plan      ***  No follow-ups on file.      {provider attestation***:1}   Alfredia Ferguson, PA-C  [facility] Primary Care at [facility] [phone] (phone) [phone] (fax)  [facility]

## 2023-09-26 ENCOUNTER — Ambulatory Visit: Payer: Medicaid Other | Admitting: Physician Assistant

## 2023-09-26 ENCOUNTER — Encounter: Payer: Self-pay | Admitting: Physician Assistant

## 2023-09-26 ENCOUNTER — Ambulatory Visit (INDEPENDENT_AMBULATORY_CARE_PROVIDER_SITE_OTHER): Payer: Medicaid Other | Admitting: Physician Assistant

## 2023-09-26 VITALS — BP 110/70 | HR 113 | Temp 98.3°F | Resp 18 | Ht 63.0 in | Wt 153.6 lb

## 2023-09-26 DIAGNOSIS — L700 Acne vulgaris: Secondary | ICD-10-CM

## 2023-09-26 DIAGNOSIS — M79622 Pain in left upper arm: Secondary | ICD-10-CM

## 2023-09-26 NOTE — Progress Notes (Signed)
Established patient visit   Patient: Amanda [name]   DOB: 06-10-1996   27 y.o. Female  MRN: [id number] Visit Date: 09/26/2023  Today's healthcare provider: Alfredia Ferguson, PA-C   Chief Complaint  Patient presents with   Arm Pain    Left under arm, pain started yesterday.    Subjective    Arm Pain  Pertinent negatives include no chest pain.    Pt reports pain under her left arm that started x 1 day ago, she is not sure if she felt an enlarged lymph node. She is going through a very stressful time, needing to move back in with her family with her two daughters.  Medications: Outpatient Medications Prior to Visit  Medication Sig   Adapalene (DIFFERIN) 0.3 % gel Apply to face qhs, wash off qam   busPIRone (BUSPAR) 10 MG tablet TAKE 2 TABLETS BY MOUTH 2 TIMES DAILY.   Clindamycin-Benzoyl Per, Refr, (DUAC) gel Apply to face qam, wash off qhs   escitalopram (LEXAPRO) 20 MG tablet Take 1 tablet (20 mg total) by mouth daily. Dose increase   hydrOXYzine (VISTARIL) 25 MG capsule TAKE 1-2 CAPSULES (25-50 MG TOTAL) BY MOUTH AT BEDTIME AS NEEDED. FOR ANXIETY AND SLEEP   levocetirizine (XYZAL) 5 MG tablet Take 1 tablet (5 mg total) by mouth in the morning and at bedtime.   montelukast (SINGULAIR) 10 MG tablet Take 1 tablet (10 mg total) by mouth at bedtime.   tiZANidine (ZANAFLEX) 2 MG tablet Take 1 tablet (2 mg total) by mouth at bedtime.   topiramate (TOPAMAX) 25 MG tablet Take 3 tablets (75 mg total) by mouth at bedtime.   traZODone (DESYREL) 50 MG tablet TAKE 1/2 TABLET BY MOUTH AT BEDTIME AS NEEDED FOR SLEEP   triamcinolone lotion (KENALOG) 0.1 % Apply 1 Application topically 2 (two) times daily.   valACYclovir (VALTREX) 1000 MG tablet Take 1 tablet (1,000 mg total) by mouth daily.   Zavegepant HCl (ZAVZPRET) 10 MG/ACT SOLN Place 1 spray into the nose daily as needed. Maximum 1 spray in 24 hours.   No facility-administered medications prior to visit.    Review of Systems   Constitutional:  Negative for fatigue and fever.  Respiratory:  Negative for cough and shortness of breath.   Cardiovascular:  Negative for chest pain and leg swelling.  Gastrointestinal:  Negative for abdominal pain.  Neurological:  Negative for dizziness and headaches.      Objective    BP 110/70 (BP Location: Right Arm, Patient Position: Sitting, Cuff Size: Normal)   Pulse (!) 113   Temp 98.3 F (36.8 C) (Oral)   Resp 18   Ht 5\' 3"  (1.6 m)   Wt 153 lb 9.6 oz (69.7 kg)   LMP 08/21/2023 (Approximate)   SpO2 99%   BMI 27.21 kg/m   Physical Exam Vitals reviewed.  Constitutional:      Appearance: She is not ill-appearing.  HENT:     Head: Normocephalic.  Eyes:     Conjunctiva/sclera: Conjunctivae normal.  Cardiovascular:     Rate and Rhythm: Normal rate.  Pulmonary:     Effort: Pulmonary effort is normal. No respiratory distress.  Chest:     Comments: L axilla w/ tenderness but no palpable masses or lymph nodes. No rash, edema. Neurological:     General: No focal deficit present.     Mental Status: She is alert and oriented to person, place, and time.  Psychiatric:  Mood and Affect: Mood normal.        Behavior: Behavior normal.      No results found for any visits on 09/26/23.  Assessment & Plan    1. Axillary tenderness, left Tender, but no masses appreciated. Advised pt to use ice/heat, monitor symptoms If they persist would order an ultrasound.  2. Acne vulgaris Pt is moving and needs a new referral - Ambulatory referral to Dermatology    Return if symptoms worsen or fail to improve.      I, Alfredia Ferguson, PA-C have reviewed all documentation for this visit. The documentation on  09/26/23   for the exam, diagnosis, procedures, and orders are all accurate and complete.    Alfredia Ferguson, PA-C  [facility] Primary Care at [facility] [phone] (phone) [phone] (fax)  [facility]

## 2023-10-08 ENCOUNTER — Ambulatory Visit (INDEPENDENT_AMBULATORY_CARE_PROVIDER_SITE_OTHER): Payer: Medicaid Other | Admitting: Physician Assistant

## 2023-10-08 ENCOUNTER — Encounter: Payer: Self-pay | Admitting: Physician Assistant

## 2023-10-08 ENCOUNTER — Other Ambulatory Visit (HOSPITAL_COMMUNITY)
Admission: RE | Admit: 2023-10-08 | Discharge: 2023-10-08 | Disposition: A | Discharge status: Home / Self Care | Payer: Medicaid Other | Source: Ambulatory Visit | Attending: Physician Assistant | Admitting: Physician Assistant

## 2023-10-08 VITALS — BP 122/62 | HR 110 | Resp 18 | Ht 63.0 in | Wt 151.8 lb

## 2023-10-08 DIAGNOSIS — Z113 Encounter for screening for infections with a predominantly sexual mode of transmission: Secondary | ICD-10-CM | POA: Insufficient documentation

## 2023-10-08 DIAGNOSIS — Z309 Encounter for contraceptive management, unspecified: Secondary | ICD-10-CM | POA: Diagnosis not present

## 2023-10-08 DIAGNOSIS — A6000 Herpesviral infection of urogenital system, unspecified: Secondary | ICD-10-CM | POA: Diagnosis not present

## 2023-10-08 DIAGNOSIS — J0110 Acute frontal sinusitis, unspecified: Secondary | ICD-10-CM

## 2023-10-08 MED ORDER — CEFDINIR 300 MG PO CAPS
300.0000 mg | ORAL_CAPSULE | Freq: Two times a day (BID) | ORAL | 0 refills | Status: DC
Start: 2023-10-08 — End: 2023-11-01

## 2023-10-08 MED ORDER — VALACYCLOVIR HCL 1 G PO TABS: 1000.0000 mg | ORAL_TABLET | Freq: Every day | ORAL | 2 refills | Status: DC

## 2023-10-08 NOTE — Progress Notes (Signed)
Established patient visit   Patient: Amanda [name]   DOB: 12-25-1995   27 y.o. Female  MRN: [id number] Visit Date: 10/08/2023  Today's healthcare provider: Alfredia Ferguson, PA-C   Cc. Sinus congestion, green mucous, STI testing, birth control  Subjective     Discussed the use of AI scribe software for clinical note transcription with the patient, who gave verbal consent to proceed.  History of Present Illness   Pt reports a sore throat that started last week, progressively worsened, now with green, thick mucous. Reports fatigue, sinus congestion, loss of voice. Her daughters were sick 1-2 weeks ago.  In addition to the respiratory symptoms, the patient expresses concern about a recent disclosure of infidelity by a former partner. She is worried about potential exposure to sexually transmitted infections and requests comprehensive sexually transmitted infection screening.  The patient also expresses a desire for birth control, specifically an intrauterine device (IUD), to prevent future pregnancies. She has a history of forgetting to take oral contraceptive pills.       Medications: Outpatient Medications Prior to Visit  Medication Sig   Adapalene (DIFFERIN) 0.3 % gel Apply to face qhs, wash off qam   busPIRone (BUSPAR) 10 MG tablet TAKE 2 TABLETS BY MOUTH 2 TIMES DAILY.   Clindamycin-Benzoyl Per, Refr, (DUAC) gel Apply to face qam, wash off qhs   escitalopram (LEXAPRO) 20 MG tablet Take 1 tablet (20 mg total) by mouth daily. Dose increase   hydrOXYzine (VISTARIL) 25 MG capsule TAKE 1-2 CAPSULES (25-50 MG TOTAL) BY MOUTH AT BEDTIME AS NEEDED. FOR ANXIETY AND SLEEP   levocetirizine (XYZAL) 5 MG tablet Take 1 tablet (5 mg total) by mouth in the morning and at bedtime.   montelukast (SINGULAIR) 10 MG tablet Take 1 tablet (10 mg total) by mouth at bedtime.   tiZANidine (ZANAFLEX) 2 MG tablet Take 1 tablet (2 mg total) by mouth at bedtime.   topiramate (TOPAMAX) 25 MG tablet Take 3  tablets (75 mg total) by mouth at bedtime.   traZODone (DESYREL) 50 MG tablet TAKE 1/2 TABLET BY MOUTH AT BEDTIME AS NEEDED FOR SLEEP   triamcinolone lotion (KENALOG) 0.1 % Apply 1 Application topically 2 (two) times daily.   Zavegepant HCl (ZAVZPRET) 10 MG/ACT SOLN Place 1 spray into the nose daily as needed. Maximum 1 spray in 24 hours.   [DISCONTINUED] valACYclovir (VALTREX) 1000 MG tablet Take 1 tablet (1,000 mg total) by mouth daily.   No facility-administered medications prior to visit.    Review of Systems  Constitutional:  Positive for fatigue. Negative for fever.  HENT:  Positive for congestion and postnasal drip.   Respiratory:  Positive for cough. Negative for shortness of breath.   Cardiovascular:  Negative for chest pain and leg swelling.  Gastrointestinal:  Negative for abdominal pain.  Neurological:  Negative for dizziness and headaches.       Objective    BP 122/62 (BP Location: Left Arm, Patient Position: Sitting, Cuff Size: Normal)   Pulse (!) 110   Resp 18   Ht 5\' 3"  (1.6 m)   Wt 151 lb 12.8 oz (68.9 kg)   LMP 09/20/2023 (Approximate)   SpO2 99%   BMI 26.89 kg/m    Physical Exam Constitutional:      General: She is awake.     Appearance: She is well-developed.  HENT:     Head: Normocephalic.     Mouth/Throat:     Pharynx: Posterior oropharyngeal erythema present. No oropharyngeal  exudate.  Eyes:     Conjunctiva/sclera: Conjunctivae normal.  Cardiovascular:     Rate and Rhythm: Normal rate.  Pulmonary:     Effort: Pulmonary effort is normal.     Breath sounds: Normal breath sounds.  Skin:    General: Skin is warm.  Neurological:     Mental Status: She is alert and oriented to person, place, and time.  Psychiatric:        Attention and Perception: Attention normal.        Mood and Affect: Mood normal.        Speech: Speech normal.        Behavior: Behavior is cooperative.     No results found for any visits on 10/08/23.  Assessment & Plan     1. Acute non-recurrent frontal sinusitis Hydrate, rest. Tylenol otc - cefdinir (OMNICEF) 300 MG capsule; Take 1 capsule (300 mg total) by mouth 2 (two) times daily.  Dispense: 14 capsule; Refill: 0  2. Routine screening for STI (sexually transmitted infection) asymptomatic - RPR - Hepatitis C antibody - HIV antibody (with reflex) - Urine cytology ancillary only  3. Genital herpes simplex type 2 Pt takes daily valtrex for prevention of flares, refilled. - valACYclovir (VALTREX) 1000 MG tablet; Take 1 tablet (1,000 mg total) by mouth daily.  Dispense: 90 tablet; Refill: 2  4. Encounter for contraceptive management, unspecified type -Discussed IUD as an option, patient to consider and follow up with gynecologist.  - Ambulatory referral to Gynecology Assessment and Plan  Return if symptoms worsen or fail to improve.       Alfredia Ferguson, PA-C  [facility] Primary Care at [facility] [phone] (phone) [phone] (fax)  [facility]

## 2023-10-09 LAB — HIV ANTIBODY (ROUTINE TESTING W REFLEX): HIV 1&2 Ab, 4th Generation: NONREACTIVE

## 2023-10-09 LAB — HEPATITIS C ANTIBODY: Hepatitis C Ab: NONREACTIVE

## 2023-10-09 LAB — RPR: RPR Ser Ql: NONREACTIVE

## 2023-10-10 LAB — URINE CYTOLOGY ANCILLARY ONLY
Chlamydia: NEGATIVE
Comment: NEGATIVE
Comment: NEGATIVE
Comment: NORMAL
Neisseria Gonorrhea: NEGATIVE
Trichomonas: NEGATIVE

## 2023-10-16 ENCOUNTER — Other Ambulatory Visit (HOSPITAL_COMMUNITY)
Admission: RE | Admit: 2023-10-16 | Discharge: 2023-10-16 | Disposition: A | Discharge status: Home / Self Care | Payer: Medicaid Other | Source: Ambulatory Visit | Attending: Physician Assistant | Admitting: Physician Assistant

## 2023-10-16 ENCOUNTER — Encounter: Payer: Self-pay | Admitting: Physician Assistant

## 2023-10-16 ENCOUNTER — Ambulatory Visit (INDEPENDENT_AMBULATORY_CARE_PROVIDER_SITE_OTHER): Payer: Medicaid Other | Admitting: Physician Assistant

## 2023-10-16 VITALS — BP 107/74 | HR 100 | Temp 98.0°F | Ht 63.0 in | Wt 150.5 lb

## 2023-10-16 DIAGNOSIS — Z202 Contact with and (suspected) exposure to infections with a predominantly sexual mode of transmission: Secondary | ICD-10-CM | POA: Insufficient documentation

## 2023-10-16 DIAGNOSIS — Z23 Encounter for immunization: Secondary | ICD-10-CM | POA: Diagnosis not present

## 2023-10-16 NOTE — Progress Notes (Signed)
Established patient visit   Patient: Amanda [name]   DOB: 01/24/1996   27 y.o. Female  MRN: [id number] Visit Date: 10/16/2023  Today's healthcare provider: Alfredia Ferguson, PA-C   Cc. Sti exposure  Subjective     Pt reports recent possible STI exposure w/ a new partner since her last testing. Denies symptoms today.  Medications: Outpatient Medications Prior to Visit  Medication Sig   Adapalene (DIFFERIN) 0.3 % gel Apply to face qhs, wash off qam   busPIRone (BUSPAR) 10 MG tablet TAKE 2 TABLETS BY MOUTH 2 TIMES DAILY.   cefdinir (OMNICEF) 300 MG capsule Take 1 capsule (300 mg total) by mouth 2 (two) times daily.   Clindamycin-Benzoyl Per, Refr, (DUAC) gel Apply to face qam, wash off qhs   escitalopram (LEXAPRO) 20 MG tablet Take 1 tablet (20 mg total) by mouth daily. Dose increase   hydrOXYzine (VISTARIL) 25 MG capsule TAKE 1-2 CAPSULES (25-50 MG TOTAL) BY MOUTH AT BEDTIME AS NEEDED. FOR ANXIETY AND SLEEP   levocetirizine (XYZAL) 5 MG tablet Take 1 tablet (5 mg total) by mouth in the morning and at bedtime.   montelukast (SINGULAIR) 10 MG tablet Take 1 tablet (10 mg total) by mouth at bedtime.   tiZANidine (ZANAFLEX) 2 MG tablet Take 1 tablet (2 mg total) by mouth at bedtime.   topiramate (TOPAMAX) 25 MG tablet Take 3 tablets (75 mg total) by mouth at bedtime.   traZODone (DESYREL) 50 MG tablet TAKE 1/2 TABLET BY MOUTH AT BEDTIME AS NEEDED FOR SLEEP   triamcinolone lotion (KENALOG) 0.1 % Apply 1 Application topically 2 (two) times daily.   valACYclovir (VALTREX) 1000 MG tablet Take 1 tablet (1,000 mg total) by mouth daily.   Zavegepant HCl (ZAVZPRET) 10 MG/ACT SOLN Place 1 spray into the nose daily as needed. Maximum 1 spray in 24 hours.   No facility-administered medications prior to visit.    Review of Systems  Constitutional:  Negative for fatigue and fever.  Respiratory:  Negative for cough and shortness of breath.   Cardiovascular:  Negative for chest pain and leg  swelling.  Gastrointestinal:  Negative for abdominal pain.  Neurological:  Negative for dizziness and headaches.       Objective    BP 107/74   Pulse 100   Temp 98 F (36.7 C) (Oral)   Ht 5\' 3"  (1.6 m)   Wt 150 lb 8 oz (68.3 kg)   LMP 09/20/2023 (Approximate)   SpO2 98%   BMI 26.66 kg/m    Physical Exam Vitals reviewed.  Constitutional:      Appearance: She is not ill-appearing.  HENT:     Head: Normocephalic.  Eyes:     Conjunctiva/sclera: Conjunctivae normal.  Cardiovascular:     Rate and Rhythm: Normal rate.  Pulmonary:     Effort: Pulmonary effort is normal. No respiratory distress.  Neurological:     General: No focal deficit present.     Mental Status: She is alert and oriented to person, place, and time.  Psychiatric:        Mood and Affect: Mood normal.        Behavior: Behavior normal.      No results found for any visits on 10/16/23.  Assessment & Plan    Immunization due -     Flu vaccine trivalent PF, 6mos and older(Flulaval,Afluria,Fluarix,Fluzone)  Possible exposure to STI -     Urine cytology ancillary only -     RPR -  HIV Antibody (routine testing w rflx) -     Hepatitis C antibody   Advised pt if any exposure or need for screening the future w/o symptoms, can message and we can provide script for testing.  Return if symptoms worsen or fail to improve.       Alfredia Ferguson, PA-C  [facility] Primary Care at [facility] [phone] (phone) [phone] (fax)  [facility]

## 2023-10-17 LAB — URINE CYTOLOGY ANCILLARY ONLY
Chlamydia: NEGATIVE
Comment: NEGATIVE
Comment: NEGATIVE
Comment: NORMAL
Neisseria Gonorrhea: NEGATIVE
Trichomonas: NEGATIVE

## 2023-10-17 LAB — RPR: RPR Ser Ql: NONREACTIVE

## 2023-10-17 LAB — HEPATITIS C ANTIBODY: Hepatitis C Ab: NONREACTIVE

## 2023-10-17 LAB — HIV ANTIBODY (ROUTINE TESTING W REFLEX): HIV 1&2 Ab, 4th Generation: NONREACTIVE

## 2023-10-22 NOTE — Progress Notes (Deleted)
NEUROLOGY FOLLOW UP OFFICE NOTE  Amanda [name] [id number]  Assessment/Plan:   1  Migraine without aura, without status migrainosus, intractable - cervicogenic 2  Migraine with aura - one episode   1.Migraine prevention:  Increase topiramate to 75mg  at bedtime.   2.Migraine rescue:  Zavzpret 3.May use Tylenol.  Limit use of pain relievers to no more than 2 days out of week to prevent risk of rebound or medication-overuse headache. 4.Keep headache diary 5.Continue OMM/neck management with Dr. Katrinka Blazing 6.Follow up 6 months.   Subjective:  Amanda [name] is a 27 year old right-handed female who follows up for migraines.   UPDATE: Increased topiramate last visit.  Noted improvement.  Received cervical spinal epidural ***.  Migraines are mild, lasting 15-20 minutes with Zavzpret and occurring 2-3 days a month.   No more migraines with aura.     Current NSAIDS/analgesics:  none Current triptans:  none Current ergotamine:  none Current anti-emetic:  none Current muscle relaxants:  none Current Antihypertensive medications:  none Current Antidepressant medications:  Lexapro 5g Current Anticonvulsant medications:  topiramate 75mg  at bedtime *** Current anti-CGRP:  Zavzpret, Ubrelvy 100mg  *** Current Vitamins/Herbal/Supplements:  none Current Antihistamines/Decongestants:  Zyrtec Other therapy:  OMT for neck pain Hormone/birth control:  none Anti-anxiety:  Hydroxyzine Sleep:  trazodone 25mg  QHS   Caffeine:  No coffee.  Sometimes a Red Bull Diet:  Hydrates with water.  Tries not to skip meals.  Occasional soda Exercise:  Walks routine Depression:  improved; Anxiety:  no Other pain:  no Sleep hygiene:  At least 6-7 hours   HISTORY:  Migraines since teenager.  They are bifrontal/facial pressure with photophobia, phonophobia, dizziness and sometimes nausea.  In February 2018, she had a migraine where she lost vision in the left eye.  MRI of brain with and without contrast on 01/29/2017  personally reviewed was normal.  They would last 4-5 hours and occur every 2 to 3 days.. Every 2 to 3 days, not as severe, 4 to 5 hours.  Improved during pregnancy in 2020 but became worse following birth of her twins.  Since late 2020, they are daily, lasting 4-5 hours (but within 30 minutes with Nurtec).  They are moderate in the morning and gradually become more severe later in the day with worsening dizziness.  One migraine was associated with visual aura (she couldn't seen out of her left eye and saw stars out of her right eye) but typically without aura.  Triggers include smells (perfumes, cleaning fluids), skipped meals, computer screen time, warm orange light.  She takes Tylenol once a day to every other day.       Past NSAIDS/analgesics:  Ibuprofen (contraindicated - stomach), Excedrin, acetaminophen Past abortive triptans:  Sumatriptan (lip and tongue swelling), rizatriptan (drowsiness) Past abortive ergotamine:  none Past muscle relaxants:  none Past anti-emetic:  Zofran 4mg  Past antihypertensive medications:  none Past antidepressant medications:  Amitriptyline (drowsiness), sertraline Past anticonvulsant medications:  gabapentin Past anti-CGRP:  Emgality (painful), Nurtec (ineffective) Past vitamins/Herbal/Supplements:  none Past antihistamines/decongestants:  none     Family history of headache:  Mom (chronic migraines)  PAST MEDICAL HISTORY: Past Medical History:  Diagnosis Date   Angio-edema    BRCA negative 09/2020   MyRisk neg except AXIN2 VUS   Family history of adverse reaction to anesthesia    heart condition   Family history of breast cancer 09/2020   IBIS=15.7%/riskscore=14.7%   Family history of pancreatic cancer    Family history of thyroid disease  GERD (gastroesophageal reflux disease)    Migraine    Urticaria     MEDICATIONS: Current Outpatient Medications on File Prior to Visit  Medication Sig Dispense Refill   Adapalene (DIFFERIN) 0.3 % gel Apply  to face qhs, wash off qam 45 g 3   busPIRone (BUSPAR) 10 MG tablet TAKE 2 TABLETS BY MOUTH 2 TIMES DAILY. 360 tablet 1   cefdinir (OMNICEF) 300 MG capsule Take 1 capsule (300 mg total) by mouth 2 (two) times daily. 14 capsule 0   Clindamycin-Benzoyl Per, Refr, (DUAC) gel Apply to face qam, wash off qhs 45 g 3   escitalopram (LEXAPRO) 20 MG tablet Take 1 tablet (20 mg total) by mouth daily. Dose increase 90 tablet 0   hydrOXYzine (VISTARIL) 25 MG capsule TAKE 1-2 CAPSULES (25-50 MG TOTAL) BY MOUTH AT BEDTIME AS NEEDED. FOR ANXIETY AND SLEEP 180 capsule 0   levocetirizine (XYZAL) 5 MG tablet Take 1 tablet (5 mg total) by mouth in the morning and at bedtime. 60 tablet 5   montelukast (SINGULAIR) 10 MG tablet Take 1 tablet (10 mg total) by mouth at bedtime. 30 tablet 5   tiZANidine (ZANAFLEX) 2 MG tablet Take 1 tablet (2 mg total) by mouth at bedtime. 30 tablet 0   topiramate (TOPAMAX) 25 MG tablet Take 3 tablets (75 mg total) by mouth at bedtime. 90 tablet 5   traZODone (DESYREL) 50 MG tablet TAKE 1/2 TABLET BY MOUTH AT BEDTIME AS NEEDED FOR SLEEP 45 tablet 0   triamcinolone lotion (KENALOG) 0.1 % Apply 1 Application topically 2 (two) times daily. 60 mL 0   valACYclovir (VALTREX) 1000 MG tablet Take 1 tablet (1,000 mg total) by mouth daily. 90 tablet 2   Zavegepant HCl (ZAVZPRET) 10 MG/ACT SOLN Place 1 spray into the nose daily as needed. Maximum 1 spray in 24 hours. 8 each 5   No current facility-administered medications on file prior to visit.    ALLERGIES: Allergies  Allergen Reactions   Doxycycline Other (See Comments)    Exacerbates Migraines   Imitrex [Sumatriptan] Swelling    Facial swelling and burning lips   Molds & Smuts    Pollen Extract-Tree Extract [Pollen Extract]    Shrimp Extract    Penicillins Hives    Did it involve swelling of the face/tongue/throat, SOB, or low BP? No Did it involve sudden or severe rash/hives, skin peeling, or any reaction on the inside of your mouth  or nose? Yes Did you need to seek medical attention at a hospital or doctor's office? Yes When did it last happen?      childhood allergy If all above answers are "NO", may proceed with cephalosporin use.    FAMILY HISTORY: Family History  Problem Relation Age of Onset   Migraines Mother    Allergic rhinitis Father    Skin cancer Father    Allergic rhinitis Brother    Allergies Brother    Thyroid disease Maternal Aunt    Breast cancer Maternal Aunt 45   Asthma Maternal Grandmother    Hypertension Maternal Grandmother    Thyroid disease Maternal Grandmother    Hypertension Maternal Grandfather    Pancreatic cancer Maternal Grandfather 14   Colon cancer Maternal Grandfather 60   Hypertension Paternal Grandmother    Diabetes Paternal Grandmother    Hypertension Paternal Grandfather    Diabetes Paternal Grandfather    Colon cancer Paternal Grandfather 82      Objective:  *** General: No acute distress.  Patient appears  well-groomed.   Head:  Normocephalic/atraumatic Eyes:  Fundi examined but not visualized Neck: supple, bilateral paraspinal tenderness, full range of motion Heart:  Regular rate and rhythm Neurological Exam: ***   Shon Millet, DO  CC: Alfredia Ferguson, PA-C

## 2023-10-23 ENCOUNTER — Ambulatory Visit: Payer: Medicaid Other | Admitting: Neurology

## 2023-10-26 ENCOUNTER — Other Ambulatory Visit (HOSPITAL_COMMUNITY): Payer: Self-pay

## 2023-10-30 ENCOUNTER — Encounter: Payer: Self-pay | Admitting: Physician Assistant

## 2023-10-30 ENCOUNTER — Ambulatory Visit (INDEPENDENT_AMBULATORY_CARE_PROVIDER_SITE_OTHER): Payer: Medicaid Other | Admitting: Physician Assistant

## 2023-10-30 ENCOUNTER — Ambulatory Visit (HOSPITAL_BASED_OUTPATIENT_CLINIC_OR_DEPARTMENT_OTHER)
Admission: RE | Admit: 2023-10-30 | Discharge: 2023-10-30 | Disposition: A | Discharge status: Home / Self Care | Payer: Medicaid Other | Source: Ambulatory Visit | Attending: Physician Assistant | Admitting: Physician Assistant

## 2023-10-30 ENCOUNTER — Ambulatory Visit: Payer: Medicaid Other | Admitting: Physician Assistant

## 2023-10-30 VITALS — BP 107/73 | HR 91 | Temp 98.2°F | Ht 63.0 in | Wt 159.2 lb

## 2023-10-30 DIAGNOSIS — J029 Acute pharyngitis, unspecified: Secondary | ICD-10-CM | POA: Diagnosis not present

## 2023-10-30 DIAGNOSIS — T71193A Asphyxiation due to mechanical threat to breathing due to other causes, assault, initial encounter: Secondary | ICD-10-CM

## 2023-10-30 DIAGNOSIS — M542 Cervicalgia: Secondary | ICD-10-CM

## 2023-10-30 NOTE — Patient Instructions (Addendum)
[email or web address]  Crisis hotline: [phone]
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
## 2023-10-30 NOTE — Progress Notes (Deleted)
      Established patient visit   Patient: Amanda [name]   DOB: [birth year]   27 y.o. Female  MRN: [id number] Visit Date: 10/30/2023  Today's healthcare provider: Alfredia Ferguson, PA-C   No chief complaint on file.  Subjective     ***  Medications: Outpatient Medications Prior to Visit  Medication Sig   Adapalene (DIFFERIN) 0.3 % gel Apply to face qhs, wash off qam   busPIRone (BUSPAR) 10 MG tablet TAKE 2 TABLETS BY MOUTH 2 TIMES DAILY.   cefdinir (OMNICEF) 300 MG capsule Take 1 capsule (300 mg total) by mouth 2 (two) times daily.   Clindamycin-Benzoyl Per, Refr, (DUAC) gel Apply to face qam, wash off qhs   escitalopram (LEXAPRO) 20 MG tablet Take 1 tablet (20 mg total) by mouth daily. Dose increase   hydrOXYzine (VISTARIL) 25 MG capsule TAKE 1-2 CAPSULES (25-50 MG TOTAL) BY MOUTH AT BEDTIME AS NEEDED. FOR ANXIETY AND SLEEP   levocetirizine (XYZAL) 5 MG tablet Take 1 tablet (5 mg total) by mouth in the morning and at bedtime.   montelukast (SINGULAIR) 10 MG tablet Take 1 tablet (10 mg total) by mouth at bedtime.   tiZANidine (ZANAFLEX) 2 MG tablet Take 1 tablet (2 mg total) by mouth at bedtime.   topiramate (TOPAMAX) 25 MG tablet Take 3 tablets (75 mg total) by mouth at bedtime.   traZODone (DESYREL) 50 MG tablet TAKE 1/2 TABLET BY MOUTH AT BEDTIME AS NEEDED FOR SLEEP   triamcinolone lotion (KENALOG) 0.1 % Apply 1 Application topically 2 (two) times daily.   valACYclovir (VALTREX) 1000 MG tablet Take 1 tablet (1,000 mg total) by mouth daily.   Zavegepant HCl (ZAVZPRET) 10 MG/ACT SOLN Place 1 spray into the nose daily as needed. Maximum 1 spray in 24 hours.   No facility-administered medications prior to visit.    Review of Systems {Insert previous labs (optional):23779} {See past labs  Heme  Chem  Endocrine  Serology  Results Review (optional):1}   Objective    LMP 09/20/2023 (Approximate)  {Insert last BP/Wt (optional):23777}{See vitals history (optional):1}  Physical  Exam  ***  No results found for any visits on 10/30/23.  Assessment & Plan    There are no diagnoses linked to this encounter.  ***  No follow-ups on file.       Alfredia Ferguson, PA-C  [facility] at [facility] [phone] (phone) [phone] (fax)  [facility]

## 2023-11-01 ENCOUNTER — Telehealth: Payer: Medicaid Other | Admitting: Psychiatry

## 2023-11-01 ENCOUNTER — Encounter: Payer: Self-pay | Admitting: Psychiatry

## 2023-11-01 DIAGNOSIS — F3342 Major depressive disorder, recurrent, in full remission: Secondary | ICD-10-CM

## 2023-11-01 DIAGNOSIS — G4701 Insomnia due to medical condition: Secondary | ICD-10-CM | POA: Diagnosis not present

## 2023-11-01 DIAGNOSIS — F411 Generalized anxiety disorder: Secondary | ICD-10-CM

## 2023-11-01 DIAGNOSIS — F439 Reaction to severe stress, unspecified: Secondary | ICD-10-CM | POA: Diagnosis not present

## 2023-11-01 MED ORDER — ESCITALOPRAM OXALATE 20 MG PO TABS
20.0000 mg | ORAL_TABLET | Freq: Every day | ORAL | 1 refills | Status: DC
Start: 2023-11-01 — End: 2024-08-26
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
## 2023-11-02 ENCOUNTER — Encounter: Payer: Self-pay | Admitting: Psychiatry

## 2023-11-03 ENCOUNTER — Other Ambulatory Visit: Payer: Self-pay | Admitting: Neurology

## 2023-11-06 ENCOUNTER — Ambulatory Visit (INDEPENDENT_AMBULATORY_CARE_PROVIDER_SITE_OTHER): Payer: Medicaid Other | Admitting: Licensed Clinical Social Worker

## 2023-11-06 ENCOUNTER — Encounter: Payer: Self-pay | Admitting: Physician Assistant

## 2023-11-06 DIAGNOSIS — F331 Major depressive disorder, recurrent, moderate: Secondary | ICD-10-CM

## 2023-11-06 DIAGNOSIS — F411 Generalized anxiety disorder: Secondary | ICD-10-CM | POA: Diagnosis not present

## 2023-11-06 DIAGNOSIS — F439 Reaction to severe stress, unspecified: Secondary | ICD-10-CM | POA: Diagnosis not present
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
## 2023-11-08 NOTE — Progress Notes (Unsigned)
[facility] [address] [place] [address] Phone: [phone] Subjective:    I'm seeing this patient by the request  of:  Alfredia Ferguson, PA-C  CC: Back and neck pain follow-up  [id number]  Amanda [name] is a 27 y.o. female coming in with complaint of back and neck pain. OMT 09/05/2023. Patient states   Medications patient has been prescribed: None  Taking:       Reviewing patient's chart has been seen recently for generalized anxiety disorder.  Reviewed prior external information including notes and imaging from previsou exam, outside providers and external EMR if available.   As well as notes that were available from care everywhere and other healthcare systems.  Past medical history, social, surgical and family history all reviewed in electronic medical record.  No pertanent information unless stated regarding to the chief complaint.   Past Medical History:  Diagnosis Date   Angio-edema    BRCA negative 09/2020   MyRisk neg except AXIN2 VUS   Family history of adverse reaction to anesthesia    heart condition   Family history of breast cancer 09/2020   IBIS=15.7%/riskscore=14.7%   Family history of pancreatic cancer    Family history of thyroid disease    GERD (gastroesophageal reflux disease)    Migraine    Urticaria     Allergies  Allergen Reactions   Doxycycline Other (See Comments)    Exacerbates Migraines   Imitrex [Sumatriptan] Swelling    Facial swelling and burning lips   Molds & Smuts    Pollen Extract-Tree Extract [Pollen Extract]    Shrimp Extract    Penicillins Hives    Did it involve swelling of the face/tongue/throat, SOB, or low BP? No Did it involve sudden or severe rash/hives, skin peeling, or any reaction on the inside of your mouth or nose? Yes Did you need to seek medical attention at a hospital or doctor's office? Yes When did it last happen?      childhood allergy If all above answers are  "NO", may proceed with cephalosporin use.     Review of Systems:  No headache, visual changes, nausea, vomiting, diarrhea, constipation, dizziness, abdominal pain, skin rash, fevers, chills, night sweats, weight loss, swollen lymph nodes, body aches, joint swelling, chest pain, shortness of breath, mood changes. POSITIVE muscle aches  Objective  There were no vitals taken for this visit.   General: No apparent distress alert and oriented x3 mood and affect normal, dressed appropriately.  HEENT: Pupils equal, extraocular movements intact  Respiratory: Patient's speak in full sentences and does not appear short of breath  Cardiovascular: No lower extremity edema, non tender, no erythema  Gait MSK:  Back   Osteopathic findings  C2 flexed rotated and side bent right C6 flexed rotated and side bent left T3 extended rotated and side bent right inhaled rib T9 extended rotated and side bent left L2 flexed rotated and side bent right Sacrum right on right       Assessment and Plan:  No problem-specific Assessment & Plan notes found for this encounter.    Nonallopathic problems  Decision today to treat with OMT was based on Physical Exam  After verbal consent patient was treated with HVLA, ME, FPR techniques in cervical, rib, thoracic, lumbar, and sacral  areas  Patient tolerated the procedure well with improvement in symptoms  Patient given exercises, stretches and lifestyle modifications  See medications in patient instructions if given  Patient will follow  up in 4-8 weeks    The above documentation has been reviewed and is accurate and complete Judi Saa, DO          Note: This dictation was prepared with Dragon dictation along with smaller phrase technology. Any transcriptional errors that result from this process are unintentional.

## 2023-11-13 ENCOUNTER — Ambulatory Visit (INDEPENDENT_AMBULATORY_CARE_PROVIDER_SITE_OTHER): Payer: Medicaid Other | Admitting: Licensed Clinical Social Worker

## 2023-11-13 DIAGNOSIS — F411 Generalized anxiety disorder: Secondary | ICD-10-CM | POA: Diagnosis not present

## 2023-11-13 DIAGNOSIS — F439 Reaction to severe stress, unspecified: Secondary | ICD-10-CM | POA: Diagnosis not present

## 2023-11-13 DIAGNOSIS — F331 Major depressive disorder, recurrent, moderate: Secondary | ICD-10-CM | POA: Diagnosis not present
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
## 2023-11-19 ENCOUNTER — Ambulatory Visit: Payer: Medicaid Other | Admitting: Family Medicine

## 2023-11-27 ENCOUNTER — Ambulatory Visit (INDEPENDENT_AMBULATORY_CARE_PROVIDER_SITE_OTHER): Payer: Medicaid Other | Admitting: Licensed Clinical Social Worker

## 2023-11-27 ENCOUNTER — Ambulatory Visit: Payer: Medicaid Other | Admitting: Dermatology

## 2023-11-27 DIAGNOSIS — F331 Major depressive disorder, recurrent, moderate: Secondary | ICD-10-CM

## 2023-11-27 DIAGNOSIS — F439 Reaction to severe stress, unspecified: Secondary | ICD-10-CM

## 2023-11-27 DIAGNOSIS — F411 Generalized anxiety disorder: Secondary | ICD-10-CM
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
## 2023-12-04 ENCOUNTER — Ambulatory Visit (INDEPENDENT_AMBULATORY_CARE_PROVIDER_SITE_OTHER): Payer: Medicaid Other | Admitting: Licensed Clinical Social Worker

## 2023-12-04 DIAGNOSIS — F411 Generalized anxiety disorder: Secondary | ICD-10-CM

## 2023-12-04 DIAGNOSIS — F439 Reaction to severe stress, unspecified: Secondary | ICD-10-CM | POA: Diagnosis not present

## 2023-12-04 DIAGNOSIS — F331 Major depressive disorder, recurrent, moderate: Secondary | ICD-10-CM
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
## 2023-12-11 ENCOUNTER — Ambulatory Visit: Payer: Medicaid Other | Admitting: Licensed Clinical Social Worker

## 2023-12-11 DIAGNOSIS — F331 Major depressive disorder, recurrent, moderate: Secondary | ICD-10-CM | POA: Diagnosis not present

## 2023-12-11 DIAGNOSIS — F439 Reaction to severe stress, unspecified: Secondary | ICD-10-CM

## 2023-12-11 DIAGNOSIS — F411 Generalized anxiety disorder: Secondary | ICD-10-CM

## 2023-12-11 NOTE — Progress Notes (Signed)
THERAPIST PROGRESS NOTE  Session Time: 8:53am-9:46am  Participation Level: Active  Behavioral Response: CasualAlertEuthymic  Type of Therapy: Individual Therapy  Treatment Goals addressed:  Goal: LTG: [name] will score less than 5 on the Generalized Anxiety Disorder 7 Scale (GAD-7)                                     Goal: STG: Amanda [name] will practice problem solving skills 3 times per week for the next 4 weeks.                         LTG: Pt reports she would like to "Work through all the trauma I experienced through the breakup and with the other person"                                              Goal: STG: Amanda [name] will identify internal and external stimuli that trigger PTSD symptoms       ProgressTowards Goals: Progressing  Interventions: CBT, Assertiveness Training, Supportive, and Social Skills Training  Summary: Amanda [name] is a 27 y.o. female who presents with symptoms of trauma and anxiety.  Patient identifies symptoms to include difficulty with memory, reexperiencing, uncontrollable worry, hypervigilance, negative self affect, irritability. Pt was oriented times 5. Pt was cooperative and engaged. Pt denies SI/HI/AVH.     Patient utilized therapeutic space to process current dynamics within her marriage following past separation.  Discussed the significance of accountability for patient and worked with clinician to better understand ways she can communicate her feelings effectively with her spouse.  Processed current communication patterns and addressed ways in which patient can model healthier assertive communication for her husband.  Patient continues to express an interest in marriage therapy but shares her spouse does not feel the same.  Processed ways in which both parties can move towards forgiveness and discussed firefighting rules.   Patient provided an update on her feelings of safety.  Patient identifies she was successful in filing paperwork with the  [facility] Department regarding her ex Paramore's behaviors.  Patient reports she has not noticed any "stalking" acknowledges symptoms of hypervigilance are still present.  Reviewed self-care.  Patient identifies ways in which she plans to practice self-care while she is off for the holiday season.  Suicidal/Homicidal: Nowithout intent/plan  Therapist Response: Clinician utilized active and supportive reflection to create a safe space for patient to process recent life events.  Clinician assessed for current symptoms, stressors, and safety since last session.  Continue to work with patient to establish healthy boundaries and work to better understand and practice assertive communication within her marriage.  Addressed safety and symptoms of hypervigilance.  Processed ways in which her and her husband can move towards forgiveness.  Reviewed self-care.  Plan: Return again in 1 week.  Diagnosis:GAD (generalized anxiety disorder)  MDD (major depressive disorder), recurrent episode, moderate (HCC)  Trauma and stressor-related disorder   Collaboration of Care: AEB psychiatrist can access notes and cln. Will review psychiatrists' notes. Check in with the patient and will see LCSW per availability. Patient agreed with treatment recommendations.  Patient/Guardian was advised Release of Information must be obtained prior to any record release in order to collaborate their care with an outside provider. Patient/Guardian was advised  if they have not already done so to contact the registration department to sign all necessary forms in order for [place] to release information regarding their care.   Consent: Patient/Guardian gives verbal consent for treatment and assignment of benefits for services provided during this visit. Patient/Guardian expressed understanding and agreed to proceed.   Dereck Leep, LCSW 12/11/2023

## 2023-12-18 ENCOUNTER — Ambulatory Visit: Payer: Medicaid Other | Admitting: Licensed Clinical Social Worker

## 2023-12-18 DIAGNOSIS — F411 Generalized anxiety disorder: Secondary | ICD-10-CM

## 2023-12-18 DIAGNOSIS — F439 Reaction to severe stress, unspecified: Secondary | ICD-10-CM | POA: Diagnosis not present

## 2023-12-18 DIAGNOSIS — F331 Major depressive disorder, recurrent, moderate: Secondary | ICD-10-CM

## 2023-12-18 NOTE — Progress Notes (Signed)
 THERAPIST PROGRESS NOTE  Session Time: 9-10am  Participation Level: Active  Behavioral Response: CasualAlertEuthymic  Type of Therapy: Individual Therapy  Treatment Goals addressed:  Goal: LTG: Lissa will score less than 5 on the Generalized Anxiety Disorder 7 Scale (GAD-7)                                     Goal: STG: Jaelee will practice problem solving skills 3 times per week for the next 4 weeks.                             LTG: Pt reports she would like to Work through all the trauma I experienced through the breakup and with the other person                                               Goal: STG: Tequia will identify internal and external stimuli that trigger PTSD symptoms       ProgressTowards Goals: Progressing  Interventions: CBT, Solution Focused, Assertiveness Training, and Supportive  Summary: ANBER MCKIVER is a 27 y.o. female who presents with symptoms of trauma and anxiety.  Patient identifies symptoms to include difficulty with memory, reexperiencing, uncontrollable worry, hypervigilance, negative self affect, irritability. Pt was oriented times 5. Pt was cooperative and engaged. Pt denies SI/HI/AVH.      Patient utilized therapeutic space to process recent symptomology impacting her marriage.  Patient reflected on symptoms of increased irritability and difficulties maintaining emotional regulation to communicate her feelings effectively.  Discussed past encounters that have led her to feel violated and difficulties feeling safe to visit with extended family over the holidays.  Continued to process boundaries and ways she has been navigating difficult conversations within her family.  Continued to evaluate patient's process with forgiveness and how forgiveness is presenting within her marriage.  Continued to process current communication patterns and addressed ways in which she can model healthier assertive communication for her husband.  Educated  patient on the stages of grief and discussed ways in which patient can process both parties experience with grief in order to navigate forgiveness for past behaviors.   Suicidal/Homicidal: Nowithout intent/plan  Therapist Response: Clinician utilized active and supportive reflection to create a safe space for patient to process recent life events. Clinician assessed for current symptoms, stressors, and safety since last session.  Clinician worked with patient on problem solving ways she can establish healthy boundaries and address communication patterns within her marriage.  Role-played ways in which patient can explore root cause of current miscommunication.  Educated patient on the stages of grief.  Plan: Return again in 1 week.  Diagnosis: GAD (generalized anxiety disorder)  MDD (major depressive disorder), recurrent episode, moderate (HCC)  Trauma and stressor-related disorder   Collaboration of Care: AEB psychiatrist can access notes and cln. Will review psychiatrists' notes. Check in with the patient and will see LCSW per availability. Patient agreed with treatment recommendations.   Patient/Guardian was advised Release of Information must be obtained prior to any record release in order to collaborate their care with an outside provider. Patient/Guardian was advised if they have not already done so to contact the registration department to sign all necessary forms in order for us  to  release information regarding their care.   Consent: Patient/Guardian gives verbal consent for treatment and assignment of benefits for services provided during this visit. Patient/Guardian expressed understanding and agreed to proceed.   [name] Husband, LCSW 12/18/2023

## 2023-12-25 ENCOUNTER — Encounter: Payer: Self-pay | Admitting: Physician Assistant

## 2023-12-25 ENCOUNTER — Ambulatory Visit: Payer: Medicaid Other | Admitting: Licensed Clinical Social Worker

## 2023-12-25 DIAGNOSIS — F411 Generalized anxiety disorder: Secondary | ICD-10-CM | POA: Diagnosis not present

## 2023-12-25 DIAGNOSIS — F439 Reaction to severe stress, unspecified: Secondary | ICD-10-CM | POA: Diagnosis not present

## 2023-12-25 DIAGNOSIS — F331 Major depressive disorder, recurrent, moderate: Secondary | ICD-10-CM | POA: Diagnosis not present

## 2023-12-26 ENCOUNTER — Other Ambulatory Visit: Payer: Self-pay | Admitting: Physician Assistant

## 2023-12-26 ENCOUNTER — Ambulatory Visit: Payer: Medicaid Other | Admitting: Licensed Clinical Social Worker

## 2023-12-26 DIAGNOSIS — A6000 Herpesviral infection of urogenital system, unspecified: Secondary | ICD-10-CM

## 2023-12-26 MED ORDER — LIDOCAINE 5 % EX OINT
TOPICAL_OINTMENT | CUTANEOUS | 0 refills | Status: DC
Start: 2023-12-26 — End: 2024-03-14
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
## 2024-01-07 ENCOUNTER — Ambulatory Visit (INDEPENDENT_AMBULATORY_CARE_PROVIDER_SITE_OTHER): Payer: Medicaid Other | Admitting: Licensed Clinical Social Worker

## 2024-01-07 DIAGNOSIS — F411 Generalized anxiety disorder: Secondary | ICD-10-CM

## 2024-01-07 DIAGNOSIS — F331 Major depressive disorder, recurrent, moderate: Secondary | ICD-10-CM

## 2024-01-07 DIAGNOSIS — F439 Reaction to severe stress, unspecified: Secondary | ICD-10-CM

## 2024-01-07 NOTE — Progress Notes (Signed)
   THERAPIST PROGRESS NOTE  Session Time: 3:03pm-4pm  Participation Level: Active  Behavioral Response: CasualAlertEuthymic  Type of Therapy: Individual Therapy  Treatment Goals addressed:  Goal: LTG: [name] will score less than 5 on the Generalized Anxiety Disorder 7 Scale (GAD-7)                                     Goal: STG: [name] will practice problem solving skills 3 times per week for the next 4 weeks.                                            LTG: Pt reports she would like to "Work through all the trauma I experienced through the breakup and with the other person"                                               Goal: STG: [name] will identify internal and external stimuli that trigger PTSD symptoms      ProgressTowards Goals: Progressing  Interventions: CBT, Solution Focused, Supportive, and Reframing  Summary: Amanda [name] is a 28 y.o. female who presents with symptoms of trauma and anxiety.  Patient identifies symptoms to include difficulty within interpersonal relationships, uncontrollable worry, hypervigilance, negative self affect, and irritability. Pt was oriented times 5. Pt was cooperative and engaged. Pt denies SI/HI/AVH.   Patient utilized therapeutic space to process recent disputes with in-laws and supporting her husband through a coparenting relationship with her stepson.  Processed ways in which patient could have utilized assertive communication tactics versus aggressive communication styles.  Worked with patient on understanding and accepting others patterns of behaviors and acknowledging she cannot change the behaviors of others.  Provided positive feedback on husband's efforts to maintain healthy boundaries and support the feelings of his partner.  Addressed ways in which patient can remove herself from external drama and support her stepson by strengthening their relationship.  Suicidal/Homicidal: Nowithout intent/plan  Therapist Response:  Clinician utilized active and supportive reflection to create a safe space for patient to process recent life events. Clinician assessed for current symptoms, stressors, and safety since last session. Clinician continued to work with patient on problem solving ways she can establish healthy boundaries and address communication patterns.    Plan: Return again in 1 week.  Diagnosis: GAD (generalized anxiety disorder)  MDD (major depressive disorder), recurrent episode, moderate (HCC)  Trauma and stressor-related disorder   Collaboration of Care: AEB psychiatrist can access notes and cln. Will review psychiatrists' notes. Check in with the patient and will see LCSW per availability. Patient agreed with treatment recommendations.   Patient/Guardian was advised Release of Information must be obtained prior to any record release in order to collaborate their care with an outside provider. Patient/Guardian was advised if they have not already done so to contact the registration department to sign all necessary forms in order for [place] to release information regarding their care.   Consent: Patient/Guardian gives verbal consent for treatment and assignment of benefits for services provided during this visit. Patient/Guardian expressed understanding and agreed to proceed.   Dereck Leep, LCSW 01/07/2024

## 2024-01-09 ENCOUNTER — Ambulatory Visit: Payer: Medicaid Other | Admitting: Physician Assistant

## 2024-01-09 ENCOUNTER — Ambulatory Visit: Payer: Medicaid Other | Admitting: Dermatology

## 2024-01-09 DIAGNOSIS — L700 Acne vulgaris: Secondary | ICD-10-CM

## 2024-01-09 DIAGNOSIS — Z7189 Other specified counseling: Secondary | ICD-10-CM | POA: Diagnosis not present

## 2024-01-09 NOTE — Patient Instructions (Signed)

## 2024-01-09 NOTE — Progress Notes (Signed)
   Follow-Up Visit   Subjective  Amanda [name] is a 28 y.o. female who presents for the following: acne  Patient reports flares at back, shoulders, chest, face. Was using topicals, Adapalene 0.3%, Duac gel, but stopped due to not feeling like helping. She can not take doxycycline because it exacerbates her migraines. Spironolactone did not work. Minocycline did not work.    The following portions of the chart were reviewed this encounter and updated as appropriate: medications, allergies, medical history  Review of Systems:  No other skin or systemic complaints except as noted in HPI or Assessment and Plan.  Objective  Well appearing patient in no apparent distress; mood and affect are within normal limits.  A focused examination was performed of the following areas: Back chest, shoulders and face   Relevant exam findings are noted in the Assessment and Plan.    Assessment & Plan   ACNE VULGARIS- Severe, resistant to treatment Exam: inflammatory  papules at cheeks multiple closed comedones and  inflammatory papules with hyperpigmentation at back  Chronic and persistent condition with duration or expected duration over one year. Condition is bothersome/symptomatic for patient. Currently flared.   Treatment Plan: Will start isotretinoin  2 forms of bc   Vasectomy  Patient will call her pcp to restart oral bc pills. Will send mychart message and let us know. Then will schedule follow up and confirm patient in [place].  Ipledge paperwork filled out by patient today.   Patient is on anxiety medication   Urine pregnancy test performed in office today and was negative.  Patient demonstrates comprehension and confirms she will not get pregnant.   Will check labs at next follow up  Recommend gentle cleanser / moisturizer  Like la roche posay  Samples given today    While taking Isotretinoin and for 30 days after you finish the medication, do not get pregnant, do not share  pills, do not donate blood.  Generic isotretinoin is best absorbed when taken with a fatty meal. Isotretinoin can make you sensitive to the sun. Daily careful sun protection including sunscreen SPF 30+ when outdoors is recommended.   Isotretinoin Counseling; Review and Contraception Counseling: Reviewed potential side effects of isotretinoin including xerosis, cheilitis, hepatitis, hyperlipidemia, and severe birth defects if taken by a pregnant woman.  Women on isotretinoin must be celibate (not having sex) or required to use at least 2 birth control methods to prevent pregnancy (unless patient is a female of non-child bearing potential).  Females of child-bearing potential must have monthly pregnancy tests while on isotretinoin and report through I-Pledge (FDA monitoring program). Reviewed reports of suicidal ideation in those with a history of depression while taking isotretinoin and reports of diagnosis of inflammatory bowl disease (IBD) while taking isotretinoin as well as the lack of evidence for a causal relationship between isotretinoin, depression and IBD. Patient advised to reach out with any questions or concerns. Patient advised not to share pills or donate blood while on treatment or for one month after completing treatment. All patient's considering Isotretinoin must read and understand and sign Isotretinoin Consent Form and be registered with I-Pledge.     Return if symptoms worsen or fail to improve.  I, Asher Muir, CMA, am acting as scribe for Willeen Niece, MD.   Documentation: I have reviewed the above documentation for accuracy and completeness, and I agree with the above.  Willeen Niece, MD

## 2024-01-10 ENCOUNTER — Ambulatory Visit (INDEPENDENT_AMBULATORY_CARE_PROVIDER_SITE_OTHER): Payer: Medicaid Other | Admitting: Physician Assistant

## 2024-01-10 ENCOUNTER — Ambulatory Visit (HOSPITAL_BASED_OUTPATIENT_CLINIC_OR_DEPARTMENT_OTHER)
Admission: RE | Admit: 2024-01-10 | Discharge: 2024-01-10 | Disposition: A | Discharge status: Home / Self Care | Payer: Medicaid Other | Source: Ambulatory Visit | Attending: Physician Assistant | Admitting: Physician Assistant

## 2024-01-10 ENCOUNTER — Telehealth: Payer: Self-pay

## 2024-01-10 VITALS — BP 115/76 | HR 93 | Temp 98.0°F | Ht 63.0 in | Wt 168.4 lb

## 2024-01-10 DIAGNOSIS — M79671 Pain in right foot: Secondary | ICD-10-CM | POA: Insufficient documentation

## 2024-01-10 DIAGNOSIS — L700 Acne vulgaris: Secondary | ICD-10-CM

## 2024-01-10 MED ORDER — TRETINOIN 0.025 % EX CREA: TOPICAL_CREAM | Freq: Every day | CUTANEOUS | 1 refills | Status: DC

## 2024-01-10 NOTE — Telephone Encounter (Signed)
PA approved.   Approved. This drug is covered on the Preferred Drug List. It does not require prior approval. Please instruct pharmacy to process brand Retin-A cream instead of generic. Please contact [facility] pharmacy services, Prior Authorization department, if you have additional questions regarding this request: [phone]. Thank you. Authorization Expiration Date: 12/17/2024

## 2024-01-10 NOTE — Progress Notes (Signed)
Established patient visit   Patient: Amanda [name]   DOB: 1996-05-30   28 y.o. Female  MRN: [id number] Visit Date: 01/10/2024  Today's healthcare provider: Alfredia Ferguson, PA-C   Chief Complaint  Patient presents with   Toe Pain    Jammed pinky toe- still having a lot of pain. Did turn purple and lost toe nail- always caused pain. Stepped wrong a few days ago and cannot bare weight     Contraception    Will start new medication and derm recommended her to start on [facility]   Subjective     Discussed the use of AI scribe software for clinical note transcription with the patient, who gave verbal consent to proceed.  History of Present Illness   The patient presents with a painful pinky toe on her right foot. She reports that she cannot put her full weight on the affected side and has experienced episodes of collapsing due to the pain. The pain is localized to the toe and slightly down the foot. The patient has a history of breaking/jamming the same toe approximately two years ago. She reports that the pain is recurrent and can be so severe that she is unable to walk.  In addition to her foot issue, the patient also discusses her struggle with acne, particularly on her back. She has tried two different types of pills in the past but did not take them as prescribed due to perceived lack of improvement. She is currently considering starting Accutane but is hesitant due to potential side effects and the requirement for additional contraception. The patient has a history of migraines, which she fears may worsen with the addition of new medications.       Medications: Outpatient Medications Prior to Visit  Medication Sig   busPIRone (BUSPAR) 10 MG tablet TAKE 2 TABLETS BY MOUTH 2 TIMES DAILY.   Clindamycin-Benzoyl Per, Refr, (DUAC) gel Apply to face qam, wash off qhs   escitalopram (LEXAPRO) 20 MG tablet Take 1 tablet (20 mg total) by mouth daily. Dose increase   hydrOXYzine (VISTARIL) 25  MG capsule TAKE 1-2 CAPSULES (25-50 MG TOTAL) BY MOUTH AT BEDTIME AS NEEDED. FOR ANXIETY AND SLEEP   levocetirizine (XYZAL) 5 MG tablet Take 1 tablet (5 mg total) by mouth in the morning and at bedtime.   lidocaine (XYLOCAINE) 5 % ointment Apply a small amount to the area of concern twice daily   montelukast (SINGULAIR) 10 MG tablet Take 1 tablet (10 mg total) by mouth at bedtime.   tiZANidine (ZANAFLEX) 2 MG tablet Take 1 tablet (2 mg total) by mouth at bedtime.   topiramate (TOPAMAX) 25 MG tablet TAKE 3 TABLETS BY MOUTH AT BEDTIME.   traZODone (DESYREL) 50 MG tablet TAKE 1/2 TABLET BY MOUTH AT BEDTIME AS NEEDED FOR SLEEP   triamcinolone lotion (KENALOG) 0.1 % Apply 1 Application topically 2 (two) times daily.   valACYclovir (VALTREX) 1000 MG tablet Take 1 tablet (1,000 mg total) by mouth daily.   Zavegepant HCl (ZAVZPRET) 10 MG/ACT SOLN Place 1 spray into the nose daily as needed. Maximum 1 spray in 24 hours.   [DISCONTINUED] Adapalene (DIFFERIN) 0.3 % gel Apply to face qhs, wash off qam   No facility-administered medications prior to visit.    Review of Systems  Constitutional:  Negative for fatigue and fever.  Respiratory:  Negative for cough and shortness of breath.   Cardiovascular:  Negative for chest pain and leg swelling.  Gastrointestinal:  Negative  for abdominal pain.  Musculoskeletal:        Toe pain   Skin:  Positive for rash.  Neurological:  Negative for dizziness and headaches.       Objective    BP 115/76   Pulse 93   Temp 98 F (36.7 C) (Oral)   Ht 5\' 3"  (1.6 m)   Wt 168 lb 6 oz (76.4 kg)   LMP 12/20/2023   SpO2 94%   BMI 29.83 kg/m    Physical Exam Vitals reviewed.  Constitutional:      Appearance: She is not ill-appearing.  HENT:     Head: Normocephalic.  Eyes:     Conjunctiva/sclera: Conjunctivae normal.  Cardiovascular:     Rate and Rhythm: Normal rate.  Pulmonary:     Effort: Pulmonary effort is normal. No respiratory distress.  Feet:      Comments: Right toes with no edema , erythema, ecchymosis. Full ROM of right pinky toe, slightly tender to compression Neurological:     General: No focal deficit present.     Mental Status: She is alert and oriented to person, place, and time.  Psychiatric:        Mood and Affect: Mood normal.        Behavior: Behavior normal.      No results found for any visits on 01/10/24.  Assessment & Plan    Right foot pain -     DG Foot Complete Right; Future  Acne vulgaris -     Tretinoin; Apply topically at bedtime. Start with 2-3 times a week and gradually increase frequency  Dispense: 45 g; Refill: 1 -     Ambulatory referral to Dermatology   Given repetitive nature of pain the toe causes, recommending imaging today.   Referring for second opinion per pt preference on acne, and trialing tretinoin today. starting tretinoin with gradual increase in frequency and strength. - Advise avoiding products with heavy fragrances and showering soon after workouts    Return if symptoms worsen or fail to improve.       Alfredia Ferguson, PA-C  [facility] [phone] (phone) [phone] (fax)  [facility]

## 2024-01-10 NOTE — Telephone Encounter (Signed)
PA initiated via Covermymeds; KEY: [id number]. Awaiting determination.

## 2024-01-11 ENCOUNTER — Encounter: Payer: Self-pay | Admitting: Physician Assistant

## 2024-01-11 NOTE — Progress Notes (Unsigned)
[facility] [address] [place] [address] Phone: [phone] Subjective:   Amanda [name], am serving as a scribe for Dr. Antoine Primas.  I'm seeing this patient by the request  of:  Alfredia Ferguson, PA-C  CC: Back and neck pain follow-up  WUX:[id number]  Amanda [name] is a 28 y.o. female coming in with complaint of back and neck pain OMT on 09/05/2023. Patient states that her neck has been bothering her more than usual. Also having locking and popping of TMJ, R>L.   Medications patient has been prescribed:   Taking:         Reviewed prior external information including notes and imaging from previsou exam, outside providers and external EMR if available.   As well as notes that were available from care everywhere and other healthcare systems.  Past medical history, social, surgical and family history all reviewed in electronic medical record.  No pertanent information unless stated regarding to the chief complaint.   Past Medical History:  Diagnosis Date   Angio-edema    BRCA negative 09/2020   MyRisk neg except AXIN2 VUS   Family history of adverse reaction to anesthesia    heart condition   Family history of breast cancer 09/2020   IBIS=15.7%/riskscore=14.7%   Family history of pancreatic cancer    Family history of thyroid disease    GERD (gastroesophageal reflux disease)    Migraine    Urticaria     Allergies  Allergen Reactions   Doxycycline Other (See Comments)    Exacerbates Migraines   Imitrex [Sumatriptan] Swelling    Facial swelling and burning lips   Molds & Smuts    Pollen Extract-Tree Extract [Pollen Extract]    Shrimp Extract    Penicillins Hives    Did it involve swelling of the face/tongue/throat, SOB, or low BP? No Did it involve sudden or severe rash/hives, skin peeling, or any reaction on the inside of your mouth or nose? Yes Did you need to seek medical attention at a hospital or doctor's office?  Yes When did it last happen?      childhood allergy If all above answers are "NO", may proceed with cephalosporin use.     Review of Systems:  No headache, visual changes, nausea, vomiting, diarrhea, constipation, dizziness, abdominal pain, skin rash, fevers, chills, night sweats, weight loss, swollen lymph nodes, body aches, joint swelling, chest pain, shortness of breath, mood changes. POSITIVE muscle aches, headache  Objective  Blood pressure 110/72, pulse 97, height 5\' 3"  (1.6 m), weight 170 lb (77.1 kg), last menstrual period 12/20/2023, SpO2 100%.   General: No apparent distress alert and oriented x3 mood and affect normal, dressed appropriately.  HEENT: Pupils equal, extraocular movements intact  Respiratory: Patient's speak in full sentences and does not appear short of breath  Cardiovascular: No lower extremity edema, non tender, no erythema  Gait MSK:  Back does have some loss lordosis noted.  Neck exam does have severe tightness noted.  More than patient's baseline.  Tenderness in the paraspinal musculature otherwise.  Osteopathic findings  C2 flexed rotated and side bent right C6 flexed rotated and side bent left T3 extended rotated and side bent right inhaled rib T9 extended rotated and side bent left L2 flexed rotated and side bent right Sacrum right on right       Assessment and Plan:  Cervicogenic headache Will continue to work on cervicogenic headaches.  Discussed with patient about icing regimen and home  exercises.  A lot of tightness that likely is contributing to some of the discomfort and pain from time to time.  Increase activity slowly.  Follow-up with me again in 6 to 8 weeks otherwise.    Nonallopathic problems  Decision today to treat with OMT was based on Physical Exam  After verbal consent patient was treated with HVLA, ME, FPR techniques in cervical, rib, thoracic, lumbar, and sacral  areas  Patient tolerated the procedure well with  improvement in symptoms  Patient given exercises, stretches and lifestyle modifications  See medications in patient instructions if given  Patient will follow up in 4-8 weeks     The above documentation has been reviewed and is accurate and complete Judi Saa, DO         Note: This dictation was prepared with Dragon dictation along with smaller phrase technology. Any transcriptional errors that result from this process are unintentional.

## 2024-01-12 ENCOUNTER — Encounter: Payer: Self-pay | Admitting: Physician Assistant

## 2024-01-14 ENCOUNTER — Ambulatory Visit (INDEPENDENT_AMBULATORY_CARE_PROVIDER_SITE_OTHER): Payer: Medicaid Other | Admitting: Family Medicine

## 2024-01-14 ENCOUNTER — Encounter: Payer: Self-pay | Admitting: Family Medicine

## 2024-01-14 VITALS — BP 110/72 | HR 97 | Ht 63.0 in | Wt 170.0 lb

## 2024-01-14 DIAGNOSIS — M9903 Segmental and somatic dysfunction of lumbar region: Secondary | ICD-10-CM

## 2024-01-14 DIAGNOSIS — M9901 Segmental and somatic dysfunction of cervical region: Secondary | ICD-10-CM | POA: Diagnosis not present

## 2024-01-14 DIAGNOSIS — M9908 Segmental and somatic dysfunction of rib cage: Secondary | ICD-10-CM | POA: Diagnosis not present

## 2024-01-14 DIAGNOSIS — G4486 Cervicogenic headache: Secondary | ICD-10-CM | POA: Diagnosis not present

## 2024-01-14 DIAGNOSIS — M9904 Segmental and somatic dysfunction of sacral region: Secondary | ICD-10-CM

## 2024-01-14 DIAGNOSIS — M9902 Segmental and somatic dysfunction of thoracic region: Secondary | ICD-10-CM

## 2024-01-14 NOTE — Patient Instructions (Signed)
Eat within 30 min of working out Keep hands in peripheral vision See me in 7-8 weeks

## 2024-01-14 NOTE — Assessment & Plan Note (Signed)
Will continue to work on cervicogenic headaches.  Discussed with patient about icing regimen and home exercises.  A lot of tightness that likely is contributing to some of the discomfort and pain from time to time.  Increase activity slowly.  Follow-up with me again in 6 to 8 weeks otherwise.

## 2024-01-15 ENCOUNTER — Telehealth: Payer: Medicaid Other | Admitting: Psychiatry

## 2024-01-23 ENCOUNTER — Ambulatory Visit: Payer: Medicaid Other | Admitting: Licensed Clinical Social Worker

## 2024-01-28 ENCOUNTER — Ambulatory Visit (INDEPENDENT_AMBULATORY_CARE_PROVIDER_SITE_OTHER): Payer: Medicaid Other | Admitting: Licensed Clinical Social Worker

## 2024-01-28 DIAGNOSIS — F439 Reaction to severe stress, unspecified: Secondary | ICD-10-CM

## 2024-01-28 DIAGNOSIS — F411 Generalized anxiety disorder: Secondary | ICD-10-CM | POA: Diagnosis not present

## 2024-01-28 DIAGNOSIS — F331 Major depressive disorder, recurrent, moderate: Secondary | ICD-10-CM
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
## 2024-01-29 NOTE — Progress Notes (Deleted)
 Office Visit Note  Patient: Amanda [name]             Date of Birth: 12-Nov-1996           MRN: [id number]             PCP: Alfredia Ferguson, PA-C Referring: Alfredia Ferguson, PA-C Visit Date: 02/12/2024 Occupation: @GUAROCC @  Subjective:  No chief complaint on file.   History of Present Illness: Amanda [name] is a 28 y.o. female ***     Activities of Daily Living:  Patient reports morning stiffness for *** {minute/hour:19697}.   Patient {ACTIONS;DENIES/REPORTS:21021675::"Denies"} nocturnal pain.  Difficulty dressing/grooming: {ACTIONS;DENIES/REPORTS:21021675::"Denies"} Difficulty climbing stairs: {ACTIONS;DENIES/REPORTS:21021675::"Denies"} Difficulty getting out of chair: {ACTIONS;DENIES/REPORTS:21021675::"Denies"} Difficulty using hands for taps, buttons, cutlery, and/or writing: {ACTIONS;DENIES/REPORTS:21021675::"Denies"}  No Rheumatology ROS completed.   PMFS History:  Patient Active Problem List   Diagnosis Date Noted   Trauma and stressor-related disorder 11/01/2023   AC joint pain 09/05/2023   Abnormal uterine bleeding 07/19/2023   Hip flexor tendon tightness, left 05/17/2023   Raynaud's syndrome 12/15/2022   Insomnia 07/21/2022   Neck pain 07/10/2022   ANA positive 07/10/2022   Hot flashes 07/10/2022   Sinusitis 07/04/2022   Thyromegaly 07/04/2022   Adenoid vegetations 06/21/2022   MDD (major depressive disorder), recurrent, in full remission (HCC) 06/06/2022   Abscess of groin, right 06/05/2022   Inguinal lymphadenopathy 03/08/2022   MDD (major depressive disorder), recurrent, in partial remission (HCC) 03/06/2022   MDD (major depressive disorder), recurrent episode, mild (HCC) 02/02/2022   Intractable migraine with aura with status migrainosus 01/31/2022   Depression, major, single episode, severe (HCC) 01/31/2022   Cervicogenic headache 12/15/2021   Somatic dysfunction of cervical region 12/15/2021   Neck muscle spasm 11/03/2021   GAD (generalized  anxiety disorder) 11/03/2021   History of affective disorder 11/03/2021   CIN I (cervical intraepithelial neoplasia I) 02/14/2021   Genital herpes simplex type 2 02/10/2021   Vitamin D deficiency 06/28/2020   History of chronic sinusitis 06/28/2020   Monochorionic diamniotic twin gestation 05/20/2019   Intractable chronic migraine without aura with status migrainosus 02/11/2017   Vision loss of left eye 01/17/2017   Acne 11/18/2014    Past Medical History:  Diagnosis Date   Angio-edema    BRCA negative 09/2020   MyRisk neg except AXIN2 VUS   Family history of adverse reaction to anesthesia    heart condition   Family history of breast cancer 09/2020   IBIS=15.7%/riskscore=14.7%   Family history of pancreatic cancer    Family history of thyroid disease    GERD (gastroesophageal reflux disease)    Migraine    Urticaria     Family History  Problem Relation Age of Onset   Migraines Mother    Allergic rhinitis Father    Skin cancer Father    Allergic rhinitis Brother    Allergies Brother    Thyroid disease Maternal Aunt    Breast cancer Maternal Aunt 45   Asthma Maternal Grandmother    Hypertension Maternal Grandmother    Thyroid disease Maternal Grandmother    Hypertension Maternal Grandfather    Pancreatic cancer Maternal Grandfather 73   Colon cancer Maternal Grandfather 60   Hypertension Paternal Grandmother    Diabetes Paternal Grandmother    Hypertension Paternal Grandfather    Diabetes Paternal Grandfather    Colon cancer Paternal Grandfather 48   Past Surgical History:  Procedure Laterality Date   NO PAST SURGERIES     TONSILLECTOMY Bilateral 09/05/2022  Procedure: TONSILLECTOMY;  Surgeon: Geanie Logan, MD;  Location: [facility];  Service: ENT;  Laterality: Bilateral;   TONSILLECTOMY     WISDOM TOOTH EXTRACTION     Social History   Social History Narrative   ** Merged History Encounter **       One story home Right-handed Caffeine:  occasional coffee   Immunization History  Administered Date(s) Administered   DTaP 04/15/1998, 06/10/1998, 08/12/1998, 02/17/1999, 05/15/2003   HIB (PRP-OMP) 04/15/1998, 06/10/1998, 07/21/1999   HPV Quadrivalent 09/23/2009, 11/24/2009, 09/27/2010   Hepatitis A 08/20/2007, 08/28/2008   Hepatitis B 04/15/1998, 06/10/1998, 07/21/1999   IPV 04/15/1998, 06/10/1998, 02/17/1999, 05/15/2003   Influenza Split 12/31/2005, 10/27/2008, 09/06/2012   Influenza Whole 09/30/2009   Influenza, Seasonal, Injecte, Preservative Fre 10/16/2023   Influenza,inj,Quad PF,6+ Mos 09/11/2013, 09/04/2014, 08/20/2015, 08/29/2016, 10/04/2017, 08/20/2018, 08/13/2019, 11/03/2021, 12/21/2022   Influenza-Unspecified 09/06/2012   MMR 07/21/1999, 05/15/2003   Meningococcal Conjugate 08/20/2015   Meningococcal polysaccharide vaccine (MPSV4) 09/23/2009   PPD Test 03/13/2016, 03/20/2016   Td 08/28/2008   Tdap 08/28/2008, 08/22/2018, 10/21/2019   Varicella 02/17/1999, 08/20/2007     Objective: Vital Signs: There were no vitals taken for this visit.   Physical Exam   Musculoskeletal Exam: ***  CDAI Exam: CDAI Score: -- Patient Global: --; Provider Global: -- Swollen: --; Tender: -- Joint Exam 02/12/2024   No joint exam has been documented for this visit   There is currently no information documented on the homunculus. Go to the Rheumatology activity and complete the homunculus joint exam.  Investigation: No additional findings.  Imaging: DG Foot Complete Right Result Date: 01/11/2024 CLINICAL DATA:  Fifth toe pain EXAM: RIGHT FOOT COMPLETE - 3+ VIEW COMPARISON:  None Available. FINDINGS: There is no evidence of fracture or dislocation. There is no evidence of arthropathy or other focal bone abnormality. Soft tissues are unremarkable. IMPRESSION: Negative. Electronically Signed   By: Judie Petit.  Shick M.D.   On: 01/11/2024 08:49    Recent Labs: Lab Results  Component Value Date   WBC 10.2 08/18/2023   HGB 12.4  08/18/2023   PLT 251 08/18/2023   NA 139 08/18/2023   K 2.9 (L) 08/18/2023   CL 110 08/18/2023   CO2 19 (L) 08/18/2023   GLUCOSE 98 08/18/2023   BUN 14 08/18/2023   CREATININE 0.88 08/18/2023   BILITOT 1.0 08/18/2023   ALKPHOS 49 08/18/2023   AST 23 08/18/2023   ALT 9 08/18/2023   PROT 7.0 08/18/2023   ALBUMIN 3.9 08/18/2023   CALCIUM 8.6 (L) 08/18/2023   GFRAA 131 06/28/2020    January 23, 2023 ANA 1: 1280NS and 1: 1280NH, ENA (SCL 70, RNP, Smith, SSA, SSB, dsDNA) negative, anticardiolipin negative, beta-2 GP 1 negative, C3-C4 normal, urine protein creatinine ratio normal, thyroglobulin antibody negative, TPO antibody   Speciality Comments: No specialty comments available.  Procedures:  No procedures performed Allergies: Doxycycline, Imitrex [sumatriptan], Molds & smuts, Pollen extract-tree extract [pollen extract], Shrimp extract, and Penicillins   Assessment / Plan:     Visit Diagnoses: No diagnosis found.  Orders: No orders of the defined types were placed in this encounter.  No orders of the defined types were placed in this encounter.   Face-to-face time spent with patient was *** minutes. Greater than 50% of time was spent in counseling and coordination of care.  Follow-Up Instructions: No follow-ups on file.   Pollyann Savoy, MD  Note - This record has been created using Animal nutritionist.  Chart creation errors have been sought, but  may not always  have been located. Such creation errors do not reflect on  the standard of medical care.

## 2024-02-05 ENCOUNTER — Ambulatory Visit: Payer: Medicaid Other | Admitting: Allergy & Immunology

## 2024-02-07 ENCOUNTER — Telehealth: Payer: Self-pay | Admitting: Psychiatry

## 2024-02-12 ENCOUNTER — Ambulatory Visit: Payer: Medicaid Other | Admitting: Rheumatology

## 2024-02-12 DIAGNOSIS — G43711 Chronic migraine without aura, intractable, with status migrainosus: Secondary | ICD-10-CM

## 2024-02-12 DIAGNOSIS — N870 Mild cervical dysplasia: Secondary | ICD-10-CM

## 2024-02-12 DIAGNOSIS — R768 Other specified abnormal immunological findings in serum: Secondary | ICD-10-CM

## 2024-02-12 DIAGNOSIS — E559 Vitamin D deficiency, unspecified: Secondary | ICD-10-CM

## 2024-02-12 DIAGNOSIS — E010 Iodine-deficiency related diffuse (endemic) goiter: Secondary | ICD-10-CM

## 2024-02-12 DIAGNOSIS — M9901 Segmental and somatic dysfunction of cervical region: Secondary | ICD-10-CM

## 2024-02-12 DIAGNOSIS — F32A Depression, unspecified: Secondary | ICD-10-CM

## 2024-02-12 DIAGNOSIS — M545 Low back pain, unspecified: Secondary | ICD-10-CM

## 2024-02-12 DIAGNOSIS — I7300 Raynaud's syndrome without gangrene: Secondary | ICD-10-CM

## 2024-02-12 DIAGNOSIS — A6000 Herpesviral infection of urogenital system, unspecified: Secondary | ICD-10-CM

## 2024-02-14 ENCOUNTER — Ambulatory Visit: Payer: Self-pay | Admitting: Licensed Clinical Social Worker

## 2024-02-26 ENCOUNTER — Ambulatory Visit (INDEPENDENT_AMBULATORY_CARE_PROVIDER_SITE_OTHER): Payer: Medicaid Other | Admitting: Licensed Clinical Social Worker

## 2024-02-26 DIAGNOSIS — F411 Generalized anxiety disorder: Secondary | ICD-10-CM

## 2024-02-26 DIAGNOSIS — F331 Major depressive disorder, recurrent, moderate: Secondary | ICD-10-CM

## 2024-02-26 DIAGNOSIS — F439 Reaction to severe stress, unspecified: Secondary | ICD-10-CM | POA: Diagnosis not present

## 2024-02-26 NOTE — Progress Notes (Signed)
 THERAPIST PROGRESS NOTE  Session Time: 3:07pm-3:50pm  Participation Level: Active  Behavioral Response: CasualAlertEuthymic  Type of Therapy: Individual Therapy  Treatment Goals addressed:  Goal: LTG: Juliannah will score less than 5 on the Generalized Anxiety Disorder 7 Scale (GAD-7)                                     Goal: STG: Daleen will practice problem solving skills 3 times per week for the next 4 weeks.                                                                      LTG: Pt reports she would like to "Work through all the trauma I experienced through the breakup and with the other person"                                               Goal: STG: Viktorya will identify internal and external stimuli that trigger PTSD symptoms      ProgressTowards Goals: Progressing  Interventions: Supportive and Other: EMDR  Summary:  Amanda [name] is a 28 y.o. female who presents with symptoms of trauma and anxiety. Patient identifies symptoms to include difficulty within interpersonal relationships, uncontrollable worry, hypervigilance, negative self affect, and irritability. Pt was oriented times 5. Pt was cooperative and engaged. Pt denies SI/HI/AVH.   The patient utilized therapy to process a recent depressive episode, during which she reported crying in bed for two days. On the third day, she experienced concerning levels of anger. The patient reflected on recent life experiences that she believes have contributed to her symptoms, mentioning that she is healing from family discord. 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   The clinician and the patient reviewed controllable versus uncontrollable factors and discussed ways for the patient to manage her symptoms. The patient reported that since the episode two weeks ago, she has not experienced the same level of symptoms.  The patient began Eye Movement Desensitization and  Reprocessing (EMDR) therapy by focusing on resourcing. The clinician educated the patient about the use of a container, and the patient engaged in constructing her container. She identified that she will practice using her container frequently until her next session.   Suicidal/Homicidal: Nowithout intent/plan  Therapist Response: Clinician utilized active and supportive reflection to create a safe space for patient to process recent life events. Clinician assessed for current symptoms, stressors, and safety since last session.  Worked with patient to further validate her use of assertive communication and establishment of healthy boundaries.  Educated patient on resourcing through EMDR.  Plan: Return again in 2 weeks.  Diagnosis: GAD (generalized anxiety disorder)  MDD (major depressive disorder), recurrent episode, moderate (HCC)  Trauma and stressor-related disorder   Collaboration of Care: AEB psychiatrist can access notes and cln. Will review psychiatrists' notes. Check in with the patient and will see LCSW per availability. Patient agreed with treatment recommendations.   Patient/Guardian was advised Release of Information must be  obtained prior to any record release in order to collaborate their care with an outside provider. Patient/Guardian was advised if they have not already done so to contact the registration department to sign all necessary forms in order for [place] to release information regarding their care.   Consent: Patient/Guardian gives verbal consent for treatment and assignment of benefits for services provided during this visit. Patient/Guardian expressed understanding and agreed to proceed.   Dereck Leep, LCSW 02/26/2024

## 2024-03-04 NOTE — Progress Notes (Unsigned)
 [facility] [address] [place] [address] Phone: [phone] Subjective:   Bruce Donath, am serving as a scribe for Dr. Antoine Primas.  I'm seeing this patient by the request  of:  Alfredia Ferguson, PA-C  CC: Neck and back pain follow-up  [id number]  Amanda [name] is a 28 y.o. female coming in with complaint of back and neck pain. OMT 01/14/2024. Patient states that she is still having some jaw pain. Also has stiffness in her neck and lower back.   Patient feels like she has a lump on R side of her neck with cervical rotation. Hx of thyroid issues.   Medications patient has been prescribed: None  Taking:         Reviewed prior external information including notes and imaging from previsou exam, outside providers and external EMR if available.   As well as notes that were available from care everywhere and other healthcare systems.  Past medical history, social, surgical and family history all reviewed in electronic medical record.  No pertanent information unless stated regarding to the chief complaint.   Past Medical History:  Diagnosis Date   Angio-edema    BRCA negative 09/2020   MyRisk neg except AXIN2 VUS   Family history of adverse reaction to anesthesia    heart condition   Family history of breast cancer 09/2020   IBIS=15.7%/riskscore=14.7%   Family history of pancreatic cancer    Family history of thyroid disease    GERD (gastroesophageal reflux disease)    Migraine    Urticaria     Allergies  Allergen Reactions   Doxycycline Other (See Comments)    Exacerbates Migraines   Imitrex [Sumatriptan] Swelling    Facial swelling and burning lips   Molds & Smuts    Pollen Extract-Tree Extract [Pollen Extract]    Shrimp Extract    Penicillins Hives    Did it involve swelling of the face/tongue/throat, SOB, or low BP? No Did it involve sudden or severe rash/hives, skin peeling, or any reaction on the inside of your  mouth or nose? Yes Did you need to seek medical attention at a hospital or doctor's office? Yes When did it last happen?      childhood allergy If all above answers are "NO", may proceed with cephalosporin use.     Review of Systems:  No headache, visual changes, nausea, vomiting, diarrhea, constipation, dizziness, abdominal pain, skin rash, fevers, chills, night sweats, weight loss, swollen lymph nodes, body aches, joint swelling, chest pain, shortness of breath, mood changes. POSITIVE muscle aches  Objective  Blood pressure 100/70, pulse 65, height 5\' 3"  (1.6 m), SpO2 98%.   General: No apparent distress alert and oriented x3 mood and affect normal, dressed appropriately.  HEENT: Pupils equal, extraocular movements intact  Respiratory: Patient's speak in full sentences and does not appear short of breath  Cardiovascular: No lower extremity edema, non tender, no erythema  Gait MSK:  Back does have some loss lordosis noted.  Neck does show that there is some goiter noted.  Tenderness to palpation in the paraspinal musculature.  Osteopathic findings  C4 flexed rotated and side bent right C6 flexed rotated and side bent right T3 extended rotated and side bent right inhaled rib T9 extended rotated and side bent left L2 flexed rotated and side bent right L5 flexed rotated and side bent left Sacrum right on right       Assessment and Plan:  Neck pain Some  increase in the neck pain.  Will check patient history order.  Do not think that this is contributing but will get thyroid labs to further evaluate.  Continue to work on Air cabin crew.  Follow-up again in 6 to 8 weeks    Nonallopathic problems  Decision today to treat with OMT was based on Physical Exam  After verbal consent patient was treated with HVLA, ME, FPR techniques in cervical, rib, thoracic, lumbar, and sacral  areas  Patient tolerated the procedure well with improvement in symptoms  Patient given  exercises, stretches and lifestyle modifications  See medications in patient instructions if given  Patient will follow up in 4-8 weeks    The above documentation has been reviewed and is accurate and complete Judi Saa, DO          Note: This dictation was prepared with Dragon dictation along with smaller phrase technology. Any transcriptional errors that result from this process are unintentional.

## 2024-03-05 ENCOUNTER — Ambulatory Visit (INDEPENDENT_AMBULATORY_CARE_PROVIDER_SITE_OTHER): Payer: Medicaid Other | Admitting: Family Medicine

## 2024-03-05 ENCOUNTER — Encounter: Payer: Self-pay | Admitting: Family Medicine

## 2024-03-05 VITALS — BP 100/70 | HR 65 | Ht 63.0 in

## 2024-03-05 DIAGNOSIS — M9904 Segmental and somatic dysfunction of sacral region: Secondary | ICD-10-CM

## 2024-03-05 DIAGNOSIS — M542 Cervicalgia: Secondary | ICD-10-CM | POA: Diagnosis not present

## 2024-03-05 DIAGNOSIS — M9901 Segmental and somatic dysfunction of cervical region: Secondary | ICD-10-CM

## 2024-03-05 DIAGNOSIS — E010 Iodine-deficiency related diffuse (endemic) goiter: Secondary | ICD-10-CM

## 2024-03-05 DIAGNOSIS — M9908 Segmental and somatic dysfunction of rib cage: Secondary | ICD-10-CM

## 2024-03-05 DIAGNOSIS — M9903 Segmental and somatic dysfunction of lumbar region: Secondary | ICD-10-CM

## 2024-03-05 DIAGNOSIS — M2550 Pain in unspecified joint: Secondary | ICD-10-CM

## 2024-03-05 DIAGNOSIS — M9902 Segmental and somatic dysfunction of thoracic region: Secondary | ICD-10-CM

## 2024-03-05 NOTE — Patient Instructions (Addendum)
 Dr. Barbette Merino We will get thyroid panel today  See me again in 6-8 weeks

## 2024-03-05 NOTE — Assessment & Plan Note (Signed)
 Some increase in the neck pain.  Will check patient history order.  Do not think that this is contributing but will get thyroid labs to further evaluate.  Continue to work on Air cabin crew.  Follow-up again in 6 to 8 weeks

## 2024-03-06 ENCOUNTER — Telehealth: Payer: Medicaid Other | Admitting: Psychiatry

## 2024-03-06 ENCOUNTER — Encounter: Payer: Self-pay | Admitting: Psychiatry

## 2024-03-06 ENCOUNTER — Encounter: Payer: Self-pay | Admitting: Family Medicine

## 2024-03-06 DIAGNOSIS — G4701 Insomnia due to medical condition: Secondary | ICD-10-CM | POA: Diagnosis not present

## 2024-03-06 DIAGNOSIS — F411 Generalized anxiety disorder: Secondary | ICD-10-CM | POA: Diagnosis not present

## 2024-03-06 DIAGNOSIS — F439 Reaction to severe stress, unspecified: Secondary | ICD-10-CM

## 2024-03-06 DIAGNOSIS — F3342 Major depressive disorder, recurrent, in full remission: Secondary | ICD-10-CM

## 2024-03-06 DIAGNOSIS — F331 Major depressive disorder, recurrent, moderate: Secondary | ICD-10-CM

## 2024-03-06 LAB — THYROID PANEL WITH TSH
Free Thyroxine Index: 2.3 (ref 1.4–3.8)
T3 Uptake: 29 % (ref 22–35)
T4, Total: 8.1 ug/dL (ref 5.1–11.9)
TSH: 1.1 m[IU]/L

## 2024-03-06 NOTE — Progress Notes (Unsigned)
 Virtual Visit via Video Note  I connected with Amanda [name] on 03/06/24 at  1:40 PM EDT by a video enabled telemedicine application and verified that I am speaking with the correct person using two identifiers.  Location Provider Location : [facility] Patient Location : Home  Participants: Patient , Provider   I discussed the limitations of evaluation and management by telemedicine and the availability of in person appointments. The patient expressed understanding and agreed to proceed.    I discussed the assessment and treatment plan with the patient. The patient was provided an opportunity to ask questions and all were answered. The patient agreed with the plan and demonstrated an understanding of the instructions.   The patient was advised to call back or seek an in-person evaluation if the symptoms worsen or if the condition fails to improve as anticipated.   BH MD OP Progress Note  03/07/2024 7:25 AM Amanda [name]  MRN:  [id number]  Chief Complaint:  Chief Complaint  Patient presents with   Follow-up   Depression   Anxiety   Medication Refill   HPI: Amanda [name] is a 28 year old female, engaged, lives in [place], employed, has a history of GAD, MDD, insomnia was evaluated by telemedicine today.  She is currently taking Lexapro 20 mg daily and Buspar 20 mg twice daily for anxiety and depression, and is doing well on these medications without significant symptoms of anxiety or depression. No recent trauma-related symptoms are reported despite a history of trauma.  She is experiencing stress related to a recent disagreement with her husband, which has affected her emotional well-being. She describes feeling like she was interrogating her husband during a conversation, leading to tension. She has not yet resolved the issue with her husband.  She agrees to discuss with her therapist and has upcoming appointment.  She reports difficulty sleeping, primarily due to her four-year-old  daughter waking up at night with nightmares. She uses lavender essential oils and a 'sleepy spray' to help her daughter sleep, but these measures have only been partially effective. She sometimes takes trazodone, half of a 50 mg tablet at bedtime, to aid her own sleep.  She currently denies any suicidality, homicidality or perceptual disturbances.  Visit Diagnosis:    ICD-10-CM   1. GAD (generalized anxiety disorder)  F41.1     2. MDD (major depressive disorder), recurrent, in full remission (HCC)  F33.42     3. Insomnia due to medical condition  G47.01    Mood, daughter with sleep issues    4. Trauma and stressor-related disorder  F43.9    Unspecified, rule out PTSD      Past Psychiatric History: I have reviewed past psychiatric history from progress note on 02/02/2022.  Past trials of Zoloft-did not work, Effexor-made her suicidal.  Past Medical History:  Past Medical History:  Diagnosis Date   Angio-edema    BRCA negative 09/2020   MyRisk neg except AXIN2 VUS   Family history of adverse reaction to anesthesia    heart condition   Family history of breast cancer 09/2020   IBIS=15.7%/riskscore=14.7%   Family history of pancreatic cancer    Family history of thyroid disease    GERD (gastroesophageal reflux disease)    Migraine    Urticaria     Past Surgical History:  Procedure Laterality Date   NO PAST SURGERIES     TONSILLECTOMY Bilateral 09/05/2022   Procedure: TONSILLECTOMY;  Surgeon: Geanie Logan, MD;  Location: [facility];  Service:  ENT;  Laterality: Bilateral;   TONSILLECTOMY     WISDOM TOOTH EXTRACTION      Family Psychiatric History: I have reviewed family psychiatric history from progress note on 02/02/2022.  Family History:  Family History  Problem Relation Age of Onset   Migraines Mother    Allergic rhinitis Father    Skin cancer Father    Allergic rhinitis Brother    Allergies Brother    Thyroid disease Maternal Aunt    Breast cancer  Maternal Aunt 45   Asthma Maternal Grandmother    Hypertension Maternal Grandmother    Thyroid disease Maternal Grandmother    Hypertension Maternal Grandfather    Pancreatic cancer Maternal Grandfather 54   Colon cancer Maternal Grandfather 60   Hypertension Paternal Grandmother    Diabetes Paternal Grandmother    Hypertension Paternal Grandfather    Diabetes Paternal Grandfather    Colon cancer Paternal Grandfather 39    Social History: I have reviewed  social history from progress note on 02/02/2022. Social History   Socioeconomic History   Marital status: Married    Spouse name: Ancil Linsey   Number of children: 2   Years of education: Not on file   Highest education level: Associate degree: academic program  Occupational History   Occupation: Massage Therapy  Tobacco Use   Smoking status: Never    Passive exposure: Never   Smokeless tobacco: Never  Vaping Use   Vaping status: Never Used  Substance and Sexual Activity   Alcohol use: Never   Drug use: No   Sexual activity: Yes    Partners: Male    Birth control/protection: None  Other Topics Concern   Not on file  Social History Narrative   ** Merged History Encounter **       One story home Right-handed Caffeine: occasional coffee   Social Drivers of Corporate investment banker Strain: Medium Risk (01/10/2024)   Overall Financial Resource Strain (CARDIA)    Difficulty of Paying Living Expenses: Somewhat hard  Food Insecurity: Food Insecurity Present (01/10/2024)   Hunger Vital Sign    Worried About Running Out of Food in the Last Year: Sometimes true    Ran Out of Food in the Last Year: Never true  Transportation Needs: No Transportation Needs (01/10/2024)   PRAPARE - Administrator, Civil Service (Medical): No    Lack of Transportation (Non-Medical): No  Physical Activity: Insufficiently Active (01/10/2024)   Exercise Vital Sign    Days of Exercise per Week: 4 days    Minutes of Exercise per  Session: 30 min  Stress: Stress Concern Present (01/10/2024)   [employer] of Occupational Health - Occupational Stress Questionnaire    Feeling of Stress : To some extent  Social Connections: Unknown (01/10/2024)   Social Connection and Isolation Panel [NHANES]    Frequency of Communication with Friends and Family: Three times a week    Frequency of Social Gatherings with Friends and Family: Never    Attends Religious Services: Never    Database administrator or Organizations: No    Attends Engineer, structural: Not on file    Marital Status: Patient declined    Allergies:  Allergies  Allergen Reactions   Doxycycline Other (See Comments)    Exacerbates Migraines   Imitrex [Sumatriptan] Swelling    Facial swelling and burning lips   Molds & Smuts    Pollen Extract-Tree Extract [Pollen Extract]    Shrimp Extract  Penicillins Hives    Did it involve swelling of the face/tongue/throat, SOB, or low BP? No Did it involve sudden or severe rash/hives, skin peeling, or any reaction on the inside of your mouth or nose? Yes Did you need to seek medical attention at a hospital or doctor's office? Yes When did it last happen?      childhood allergy If all above answers are "NO", may proceed with cephalosporin use.    Metabolic Disorder Labs: Lab Results  Component Value Date   HGBA1C 5.3 01/07/2015   No results found for: "PROLACTIN" Lab Results  Component Value Date   CHOL 135 06/28/2020   TRIG 54 06/28/2020   HDL 54 06/28/2020   LDLCALC 69 06/28/2020   Lab Results  Component Value Date   TSH 1.10 03/05/2024   TSH 1.78 07/04/2022    Therapeutic Level Labs: No results found for: "LITHIUM" No results found for: "VALPROATE" No results found for: "CBMZ"  Current Medications: Current Outpatient Medications  Medication Sig Dispense Refill   busPIRone (BUSPAR) 10 MG tablet TAKE 2 TABLETS BY MOUTH 2 TIMES DAILY. 360 tablet 1   Clindamycin-Benzoyl Per, Refr,  (DUAC) gel Apply to face qam, wash off qhs 45 g 3   escitalopram (LEXAPRO) 20 MG tablet Take 1 tablet (20 mg total) by mouth daily. Dose increase 90 tablet 1   hydrOXYzine (VISTARIL) 25 MG capsule TAKE 1-2 CAPSULES (25-50 MG TOTAL) BY MOUTH AT BEDTIME AS NEEDED. FOR ANXIETY AND SLEEP 180 capsule 0   levocetirizine (XYZAL) 5 MG tablet Take 1 tablet (5 mg total) by mouth in the morning and at bedtime. 60 tablet 5   lidocaine (XYLOCAINE) 5 % ointment Apply a small amount to the area of concern twice daily 35 g 0   montelukast (SINGULAIR) 10 MG tablet Take 1 tablet (10 mg total) by mouth at bedtime. 30 tablet 5   tiZANidine (ZANAFLEX) 2 MG tablet Take 1 tablet (2 mg total) by mouth at bedtime. 30 tablet 0   topiramate (TOPAMAX) 25 MG tablet TAKE 3 TABLETS BY MOUTH AT BEDTIME. 270 tablet 1   traZODone (DESYREL) 50 MG tablet TAKE 1/2 TABLET BY MOUTH AT BEDTIME AS NEEDED FOR SLEEP 45 tablet 0   tretinoin (RETIN-A) 0.025 % cream Apply topically at bedtime. Start with 2-3 times a week and gradually increase frequency 45 g 1   triamcinolone lotion (KENALOG) 0.1 % Apply 1 Application topically 2 (two) times daily. 60 mL 0   valACYclovir (VALTREX) 1000 MG tablet Take 1 tablet (1,000 mg total) by mouth daily. 90 tablet 2   Zavegepant HCl (ZAVZPRET) 10 MG/ACT SOLN Place 1 spray into the nose daily as needed. Maximum 1 spray in 24 hours. 8 each 5   No current facility-administered medications for this visit.     Musculoskeletal: Strength & Muscle Tone:  UTA Gait & Station:  Seated Patient leans: N/A  Psychiatric Specialty Exam: Review of Systems  Psychiatric/Behavioral:  Positive for sleep disturbance. The patient is nervous/anxious.     There were no vitals taken for this visit.There is no height or weight on file to calculate BMI.  General Appearance: Casual  Eye Contact:  Fair  Speech:  Clear and Coherent  Volume:  Normal  Mood:  Anxious  Affect:  Congruent  Thought Process:  Goal Directed and  Descriptions of Associations: Intact  Orientation:  Full (Time, Place, and Person)  Thought Content: Logical   Suicidal Thoughts:  No  Homicidal Thoughts:  No  Memory:  Immediate;   Fair Recent;   Fair Remote;   Fair  Judgement:  Fair  Insight:  Fair  Psychomotor Activity:  Normal  Concentration:  Concentration: Fair and Attention Span: Fair  Recall:  [employer] of Knowledge: Fair  Language: Fair  Akathisia:  No  Handed:  Right  AIMS (if indicated): not done  Assets:  Communication Skills Desire for Improvement Housing Social Support Transportation  ADL's:  Intact  Cognition: WNL  Sleep:  Poor   Screenings: AIMS    Flowsheet Row Video Visit from 07/21/2022 in [facility]  AIMS Total Score 0      GAD-7    Flowsheet Row Counselor from 11/06/2023 in [facility] Office Visit from 10/08/2023 in [facility] Office Visit from 07/02/2023 in [facility] Video Visit from 12/12/2022 in [facility] Video Visit from 08/29/2022 in [facility]  Total GAD-7 Score 16 13 12 9 5       PHQ2-9    Flowsheet Row Counselor from 11/06/2023 in [facility] Office Visit from 10/08/2023 in [facility] at [facility] Office Visit from 07/02/2023 in [facility] Office Visit from 06/05/2023 in [facility] Office Visit from 12/21/2022 in [facility]  PHQ-2 Total Score 2 1 0 1 2  PHQ-9 Total Score 9 12 -- 7 12      Flowsheet Row Video Visit from 03/06/2024 in [facility] Counselor from 11/06/2023 in [facility] Video  Visit from 11/01/2023 in [facility]  C-SSRS RISK CATEGORY No Risk No Risk No Risk        Assessment and Plan: Amanda [name] is a 28 year old female, married, lives in [place], has a history of depression, anxiety, trauma and stress related disorder unspecified, insomnia was evaluated by telemedicine today.  Discussed assessment and plan as noted below.  Generalized Anxiety Disorder-improving Amanda [name] is on Lexapro 20 mg and Buspar 20 mg twice daily, reporting well-controlled anxiety and depression symptoms. No recent trauma-related symptoms are present. - Continue Lexapro 20 mg daily - Continue Buspar 20 mg twice daily  MDD in remission Currently denies any significant depression symptoms.  Does report marital stress and agrees to discuss with therapist. - Continue Lexapro 20 mg daily - Continue CBT with Ms. Julien Girt - Consider family therapy as needed.  Trauma and stress related disorder unspecified-rule out PTSD-improving Currently denies any trauma related symptoms. - Continue CBT. - Continue Lexapro 20 mg daily  Insomnia-unstable Current sleep problems due to child not sleeping at night. - Continue Trazodone 25 mg at bedtime as needed - Continue Hydroxyzine 25 to 50 mg at bedtime as needed - Continue sleep hygiene techniques.  Follow-up - Follow-up in clinic in 2 months or sooner in person.   Collaboration of Care: Collaboration of Care: Referral or follow-up with counselor/therapist AEB encouraged to continue CBT, has upcoming appointment.  Discussed to consider family counseling.  Patient/Guardian was advised Release of Information must be obtained prior to any record release in order to collaborate their care with an outside provider. Patient/Guardian was advised if they have not already done so to contact the registration department to sign all necessary forms in order for [place] to release information regarding  their care.   Consent:  Patient/Guardian gives verbal consent for treatment and assignment of benefits for services provided during this visit. Patient/Guardian expressed understanding and agreed to proceed.  Discussed the use of a AI scribe software for clinical note transcription with the patient, who gave verbal consent to proceed.  This note was generated in part or whole with voice recognition software. Voice recognition is usually quite accurate but there are transcription errors that can and very often do occur. I apologize for any typographical errors that were not detected and corrected.     Jomarie Longs, MD 03/07/2024, 7:25 AM

## 2024-03-06 NOTE — Progress Notes (Unsigned)
 ERROR

## 2024-03-11 ENCOUNTER — Ambulatory Visit (INDEPENDENT_AMBULATORY_CARE_PROVIDER_SITE_OTHER): Payer: Medicaid Other | Admitting: Licensed Clinical Social Worker

## 2024-03-11 DIAGNOSIS — F3342 Major depressive disorder, recurrent, in full remission: Secondary | ICD-10-CM | POA: Diagnosis not present

## 2024-03-11 DIAGNOSIS — G4701 Insomnia due to medical condition: Secondary | ICD-10-CM

## 2024-03-11 DIAGNOSIS — F439 Reaction to severe stress, unspecified: Secondary | ICD-10-CM

## 2024-03-11 DIAGNOSIS — F411 Generalized anxiety disorder: Secondary | ICD-10-CM
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
## 2024-03-13 ENCOUNTER — Ambulatory Visit: Payer: Medicaid Other | Admitting: Allergy & Immunology

## 2024-03-14 ENCOUNTER — Ambulatory Visit (INDEPENDENT_AMBULATORY_CARE_PROVIDER_SITE_OTHER): Admitting: Physician Assistant

## 2024-03-14 ENCOUNTER — Encounter: Payer: Self-pay | Admitting: Physician Assistant

## 2024-03-14 VITALS — BP 127/80 | HR 95 | Ht 63.0 in | Wt 173.6 lb

## 2024-03-14 DIAGNOSIS — M546 Pain in thoracic spine: Secondary | ICD-10-CM | POA: Diagnosis not present

## 2024-03-14 DIAGNOSIS — E876 Hypokalemia: Secondary | ICD-10-CM

## 2024-03-14 DIAGNOSIS — E669 Obesity, unspecified: Secondary | ICD-10-CM

## 2024-03-14 MED ORDER — TIZANIDINE HCL 2 MG PO TABS: 2.0000 mg | ORAL_TABLET | Freq: Every day | ORAL | 0 refills | Status: DC

## 2024-03-14 NOTE — Progress Notes (Signed)
 Established patient visit   Patient: Amanda [name]   DOB: 23-Mar-1996   28 y.o. Female  MRN: [id number] Visit Date: 03/14/2024  Today's healthcare provider: Alfredia Ferguson, PA-C   Cc. Back pain, weight gain  Subjective     Discussed the use of AI scribe software for clinical note transcription with the patient, who gave verbal consent to proceed.  History of Present Illness   The patient presents with back pain that started on Wednesday causing the patient to have to sit during massages at work. The pain is located near the shoulder blade and has been described as spasming. The patient also reports frustration with weight gain despite maintaining a calorie deficit and regular exercise. The patient has tried various diets and is concerned that hormonal issues may be contributing to the weight gain.     Medications: Outpatient Medications Prior to Visit  Medication Sig   busPIRone (BUSPAR) 10 MG tablet TAKE 2 TABLETS BY MOUTH 2 TIMES DAILY.   Clindamycin-Benzoyl Per, Refr, (DUAC) gel Apply to face qam, wash off qhs   escitalopram (LEXAPRO) 20 MG tablet Take 1 tablet (20 mg total) by mouth daily. Dose increase   hydrOXYzine (VISTARIL) 25 MG capsule TAKE 1-2 CAPSULES (25-50 MG TOTAL) BY MOUTH AT BEDTIME AS NEEDED. FOR ANXIETY AND SLEEP   levocetirizine (XYZAL) 5 MG tablet Take 1 tablet (5 mg total) by mouth in the morning and at bedtime.   topiramate (TOPAMAX) 25 MG tablet TAKE 3 TABLETS BY MOUTH AT BEDTIME.   traZODone (DESYREL) 50 MG tablet TAKE 1/2 TABLET BY MOUTH AT BEDTIME AS NEEDED FOR SLEEP   tretinoin (RETIN-A) 0.025 % cream Apply topically at bedtime. Start with 2-3 times a week and gradually increase frequency   valACYclovir (VALTREX) 1000 MG tablet Take 1 tablet (1,000 mg total) by mouth daily.   Zavegepant HCl (ZAVZPRET) 10 MG/ACT SOLN Place 1 spray into the nose daily as needed. Maximum 1 spray in 24 hours.   [DISCONTINUED] lidocaine (XYLOCAINE) 5 % ointment Apply a  small amount to the area of concern twice daily   [DISCONTINUED] montelukast (SINGULAIR) 10 MG tablet Take 1 tablet (10 mg total) by mouth at bedtime.   [DISCONTINUED] tiZANidine (ZANAFLEX) 2 MG tablet Take 1 tablet (2 mg total) by mouth at bedtime. (Patient not taking: Reported on 03/14/2024)   [DISCONTINUED] triamcinolone lotion (KENALOG) 0.1 % Apply 1 Application topically 2 (two) times daily.   No facility-administered medications prior to visit.    Review of Systems  Constitutional:  Positive for unexpected weight change. Negative for fatigue and fever.  Respiratory:  Negative for cough and shortness of breath.   Cardiovascular:  Negative for chest pain and leg swelling.  Gastrointestinal:  Negative for abdominal pain.  Musculoskeletal:  Positive for back pain.  Neurological:  Negative for dizziness and headaches.       Objective    BP 127/80   Pulse 95   Ht 5\' 3"  (1.6 m)   Wt 173 lb 9.6 oz (78.7 kg)   SpO2 100%   BMI 30.75 kg/m    Physical Exam Vitals reviewed.  Constitutional:      Appearance: She is not ill-appearing.  HENT:     Head: Normocephalic.  Eyes:     Conjunctiva/sclera: Conjunctivae normal.  Cardiovascular:     Rate and Rhythm: Normal rate.  Pulmonary:     Effort: Pulmonary effort is normal. No respiratory distress.  Musculoskeletal:     Comments: Right sided  paraspinal mm in spasm, tender  Neurological:     Mental Status: She is alert and oriented to person, place, and time.  Psychiatric:        Mood and Affect: Mood normal.        Behavior: Behavior normal.     No results found for any visits on 03/14/24.  Assessment & Plan    Obesity (BMI 30-39.9) Significant weight gain despite calorie deficit and exercise.  - Refer to [facility] for comprehensive weight management -     Amb Ref to Medical Weight Management  Hypokalemia On labs review, pt did have supplementation at the time, just never repeated. Repeat  today -     Basic metabolic panel with GFR  Acute right-sided thoracic back pain - Prescribe muscle relaxant - Advise use of ice or heat for pain relief -     tiZANidine HCl; Take 1 tablet (2 mg total) by mouth at bedtime.  Dispense: 30 tablet; Refill: 0   Return if symptoms worsen or fail to improve.      Alfredia Ferguson, PA-C  [facility] at [facility] [phone] (phone) [phone] (fax)  [facility]

## 2024-03-15 LAB — BASIC METABOLIC PANEL WITH GFR
BUN: 19 mg/dL (ref 7–25)
CO2: 25 mmol/L (ref 20–32)
Calcium: 9.4 mg/dL (ref 8.6–10.2)
Chloride: 105 mmol/L (ref 98–110)
Creat: 0.79 mg/dL (ref 0.50–0.96)
Glucose, Bld: 82 mg/dL (ref 65–99)
Potassium: 3.7 mmol/L (ref 3.5–5.3)
Sodium: 139 mmol/L (ref 135–146)
eGFR: 106 mL/min/{1.73_m2} (ref >=60)

## 2024-03-17 ENCOUNTER — Encounter: Payer: Self-pay | Admitting: Physician Assistant

## 2024-03-19 ENCOUNTER — Ambulatory Visit: Admitting: Physician Assistant

## 2024-03-24 ENCOUNTER — Ambulatory Visit (INDEPENDENT_AMBULATORY_CARE_PROVIDER_SITE_OTHER): Payer: Medicaid Other | Admitting: Licensed Clinical Social Worker

## 2024-03-24 DIAGNOSIS — Z91199 Patient's noncompliance with other medical treatment and regimen due to unspecified reason: Secondary | ICD-10-CM

## 2024-03-24 NOTE — Progress Notes (Signed)
 Clinician attempted session via face-to-face, but Amanda [name] did not appear for her session.

## 2024-03-27 ENCOUNTER — Encounter (INDEPENDENT_AMBULATORY_CARE_PROVIDER_SITE_OTHER): Payer: Self-pay | Admitting: Family Medicine

## 2024-03-27 ENCOUNTER — Ambulatory Visit (INDEPENDENT_AMBULATORY_CARE_PROVIDER_SITE_OTHER): Admitting: Family Medicine

## 2024-03-27 VITALS — BP 103/70 | HR 79 | Temp 98.4°F | Ht 63.0 in | Wt 169.2 lb

## 2024-03-27 DIAGNOSIS — G43711 Chronic migraine without aura, intractable, with status migrainosus: Secondary | ICD-10-CM

## 2024-03-27 DIAGNOSIS — F3341 Major depressive disorder, recurrent, in partial remission: Secondary | ICD-10-CM | POA: Diagnosis not present

## 2024-03-27 DIAGNOSIS — Z6829 Body mass index (BMI) 29.0-29.9, adult: Secondary | ICD-10-CM | POA: Diagnosis not present

## 2024-03-27 DIAGNOSIS — Z0289 Encounter for other administrative examinations: Secondary | ICD-10-CM

## 2024-03-27 DIAGNOSIS — E669 Obesity, unspecified: Secondary | ICD-10-CM
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
## 2024-04-07 ENCOUNTER — Other Ambulatory Visit: Payer: Self-pay | Admitting: Physician Assistant

## 2024-04-07 ENCOUNTER — Ambulatory Visit (INDEPENDENT_AMBULATORY_CARE_PROVIDER_SITE_OTHER): Admitting: Licensed Clinical Social Worker

## 2024-04-07 DIAGNOSIS — F411 Generalized anxiety disorder: Secondary | ICD-10-CM | POA: Diagnosis not present

## 2024-04-07 DIAGNOSIS — F439 Reaction to severe stress, unspecified: Secondary | ICD-10-CM

## 2024-04-07 DIAGNOSIS — M546 Pain in thoracic spine: Secondary | ICD-10-CM

## 2024-04-07 DIAGNOSIS — F3342 Major depressive disorder, recurrent, in full remission: Secondary | ICD-10-CM

## 2024-04-07 NOTE — Progress Notes (Signed)
 THERAPIST PROGRESS NOTE  Session Time: 3-3:57pm  Participation Level: Active  Behavioral Response: CasualAlertEuthymic  Type of Therapy: Individual Therapy  Treatment Goals addressed:  Goal: LTG: Amanda [name] will score less than 5 on the Generalized Anxiety Disorder 7 Scale (GAD-7)       Dates: Start:  11/06/23    Expected End:  04/05/24        Disciplines: Interdisciplinary, PROVIDER                                   Goal: STG: Amanda [name] will practice problem solving skills 3 times per week for the next 4 weeks.       Dates: Start:  11/06/23    Expected End:  04/05/24        Disciplines: Interdisciplinary, PROVIDER                                                 Template: PTSD (OP)                     Problem: BH CCP Acute or Chronic Trauma Reaction       Dates: Start:  11/06/23         Disciplines: Interdisciplinary, PROVIDER                    Goal: LTG: Recall traumatic events without becoming overwhelmed with negative emotions      Dates: Start:  11/06/23    Expected End:  04/05/24        Disciplines: Interdisciplinary, PROVIDER                                 Goal: STG: Amanda [name] will practice emotion regulation skills 2 time(s) per week for the next 8 week(s)      Dates: Start:  11/06/23    Expected End:  04/05/24        Disciplines: Interdisciplinary, PROVIDER                                 Goal: LTG: Pt reports she would like to "Work through all the trauma I experienced through the breakup and with the other person"      Dates: Start:  11/06/23    Expected End:  04/05/24        Disciplines: Interdisciplinary, PROVIDER                                 Goal: STG: Amanda [name] will identify internal and external stimuli that trigger PTSD symptoms      Dates: Start:  11/06/23    Expected End:  04/05/24        Disciplines: Interdisciplinary, PROVIDER                 ProgressTowards Goals: Progressing  Interventions: Supportive and Other:  EMDR  Summary: Amanda [name] is a 28 y.o. female who presents with symptoms of trauma and anxiety. Patient identifies symptoms to include difficulty within interpersonal relationships, uncontrollable worry, hypervigilance, negative self affect, and irritability. Pt was oriented times 5. Pt was cooperative  and engaged. Pt denies SI/HI/AVH.   Patient processed current stressors related to her spouses health and communication within the marriage.  Clinician continues to explore patient's comfort level with beginning couples therapy with her husband.  Patient continues to express an interest and hopes that her husband will come around to the idea as well.  Continue to process ways in which communication between her in-laws is serving as a stressor for her.  Addressed controllable factors.  Clinician and patient continue to work through EMDR by constructing the patient's target sequence [name] for the thought "I am not good enough."  Patient became tearful reflecting on numerous negative messages she received by her parents growing up that contributed to this negative cognition.  Identified generational trauma that is impacted her parents parenting style.  Began to process triggers between current and previous relationships that have been overwhelming as a result of experiences from her childhood.  For the neck session, the patient will continue to process memories to add to her target sequence [name].  Suicidal/Homicidal: Nowithout intent/[name]  Therapist Response: Clinician utilized active and supportive reflection to create a safe space for patient to process recent life events. Clinician assessed for current symptoms, stressors, and safety since last session. Worked with patient to further validate her establishment of healthy boundaries.  Continued EMDR by constructing her target sequence [name].  [name]: Return again in 2 weeks.  Diagnosis: GAD (generalized anxiety disorder)  MDD (major depressive  disorder), recurrent, in full remission (HCC)  Trauma and stressor-related disorder   Collaboration of Care: : AEB psychiatrist can access notes and cln. Will review psychiatrists' notes. Check in with the patient and will see LCSW per availability. Patient agreed with treatment recommendations.   Patient/Guardian was advised Release of Information must be obtained prior to any record release in order to collaborate their care with an outside provider. Patient/Guardian was advised if they have not already done so to contact the registration department to sign all necessary forms in order for us  to release information regarding their care.   Consent: Patient/Guardian gives verbal consent for treatment and assignment of benefits for services provided during this visit. Patient/Guardian expressed understanding and agreed to proceed.   Amanda Slot, LCSW 04/07/2024

## 2024-04-09 ENCOUNTER — Encounter: Payer: Self-pay | Admitting: Physician Assistant

## 2024-04-09 ENCOUNTER — Other Ambulatory Visit: Payer: Self-pay | Admitting: Physician Assistant

## 2024-04-09 DIAGNOSIS — L700 Acne vulgaris: Secondary | ICD-10-CM

## 2024-04-09 MED ORDER — TRETINOIN 0.05 % EX CREA: TOPICAL_CREAM | Freq: Every day | CUTANEOUS | 0 refills | Status: DC

## 2024-04-10 ENCOUNTER — Other Ambulatory Visit (HOSPITAL_COMMUNITY): Payer: Self-pay

## 2024-04-10 ENCOUNTER — Telehealth: Payer: Self-pay | Admitting: Pharmacy Technician

## 2024-04-10 NOTE — Telephone Encounter (Signed)
 Pharmacy Patient Advocate Encounter   Received notification from CoverMyMeds that prior authorization for Tretinoin  0.05% cream is required/requested.   Insurance verification completed.   The patient is insured through [employer] [place] .   Per test claim:  BRAND NAME RETIN-A  CREAM is preferred by the insurance.  If suggested medication is appropriate, Please send in a new RX and discontinue this one. If not, please advise as to why it's not appropriate so that we may request a Prior Authorization. Please note, some preferred medications may still require a PA.  If the suggested medications have not been trialed and there are no contraindications to their use, the PA will not be submitted, as it will not be approved.  Called patient pharmacy for claim to be reprocessed as brand preferred. New Rx is not needed.

## 2024-04-16 ENCOUNTER — Ambulatory Visit: Admitting: Physician Assistant

## 2024-04-16 NOTE — Progress Notes (Deleted)
 [name] [address] [place] [address] Phone: [phone] Subjective:    I'm seeing this patient by the request  of:  Trenton Frock, PA-C  CC:   Amanda [name]  ITATY STRENGTH is a 28 y.o. female coming in with complaint of back and neck pain. OMT on 03/05/2024. Patient states   Medications patient has been prescribed:   Taking:         Reviewed prior external information including notes and imaging from previsou exam, outside providers and external EMR if available.   As well as notes that were available from care everywhere and other healthcare systems.  Past medical history, social, surgical and family history all reviewed in electronic medical record.  No pertanent information unless stated regarding to the chief complaint.   Past Medical History:  Diagnosis Date   Angio-edema    BRCA negative 09/2020   MyRisk neg except AXIN2 VUS   Family history of adverse reaction to anesthesia    heart condition   Family history of breast cancer 09/2020   IBIS=15.7%/riskscore=14.7%   Family history of pancreatic cancer    Family history of thyroid  disease    GERD (gastroesophageal reflux disease)    Migraine    Urticaria     Allergies  Allergen Reactions   Doxycycline  Other (See Comments)    Exacerbates Migraines   Imitrex  [Sumatriptan ] Swelling    Facial swelling and burning lips   Molds & Smuts    Pollen Extract-Tree Extract [Pollen Extract]    Shrimp Extract    Penicillins Hives    Did it involve swelling of the face/tongue/throat, SOB, or low BP? No Did it involve sudden or severe rash/hives, skin peeling, or any reaction on the inside of your mouth or nose? Yes Did you need to seek medical attention at a hospital or doctor's office? Yes When did it last happen?      childhood allergy  If all above answers are "NO", may proceed with cephalosporin use.     Review of Systems:  No headache, visual changes, nausea,  vomiting, diarrhea, constipation, dizziness, abdominal pain, skin rash, fevers, chills, night sweats, weight loss, swollen lymph nodes, body aches, joint swelling, chest pain, shortness of breath, mood changes. POSITIVE muscle aches  Objective  There were no vitals taken for this visit.   General: No apparent distress alert and oriented x3 mood and affect normal, dressed appropriately.  HEENT: Pupils equal, extraocular movements intact  Respiratory: Patient's speak in full sentences and does not appear short of breath  Cardiovascular: No lower extremity edema, non tender, no erythema  Gait MSK:  Back   Osteopathic findings  C2 flexed rotated and side bent right C6 flexed rotated and side bent left T3 extended rotated and side bent right inhaled rib T9 extended rotated and side bent left L2 flexed rotated and side bent right Sacrum right on right       Assessment and Plan:  No problem-specific Assessment & Plan notes found for this encounter.    Nonallopathic problems  Decision today to treat with OMT was based on Physical Exam  After verbal consent patient was treated with HVLA, ME, FPR techniques in cervical, rib, thoracic, lumbar, and sacral  areas  Patient tolerated the procedure well with improvement in symptoms  Patient given exercises, stretches and lifestyle modifications  See medications in patient instructions if given  Patient will follow up in 4-8 weeks  Note: This dictation was prepared with Dragon dictation along with smaller phrase technology. Any transcriptional errors that result from this process are unintentional.

## 2024-04-17 ENCOUNTER — Ambulatory Visit: Admitting: Physician Assistant

## 2024-04-17 ENCOUNTER — Ambulatory Visit: Admitting: Family Medicine

## 2024-04-18 ENCOUNTER — Ambulatory Visit: Admitting: Physician Assistant

## 2024-04-21 ENCOUNTER — Ambulatory Visit
Admission: EM | Admit: 2024-04-21 | Discharge: 2024-04-21 | Disposition: A | Discharge status: Home / Self Care | Attending: Emergency Medicine | Admitting: Emergency Medicine

## 2024-04-21 DIAGNOSIS — M778 Other enthesopathies, not elsewhere classified: Secondary | ICD-10-CM

## 2024-04-21 MED ORDER — PREDNISONE 10 MG (21) PO TBPK: ORAL_TABLET | Freq: Every day | ORAL | 0 refills | Status: DC

## 2024-04-21 NOTE — Discharge Instructions (Signed)
 Your pain is most likely caused by irritation to the tendon that allows for movement due to frequent use  Begin prednisone  every morning with food as directed to reduce inflammation and help with pain, may take Tylenol  or any topical medicines additionally, avoid ibuprofen   You may use heating pad in 15 minute intervals as needed for additional comfort  Begin massage and stretch affected area daily for 10 minutes as tolerated to further loosen muscles   If pain persist after recommended treatment or reoccurs if may be beneficial to follow up with orthopedic specialist for evaluation, this doctor specializes in the bones and can manage your symptoms long-term with options such as but not limited to imaging, medications or physical therapy

## 2024-04-21 NOTE — ED Provider Notes (Signed)
 Amanda [name]    CSN: [id number] Arrival date & time: 04/21/24  1154      History   Chief Complaint Chief Complaint  Patient presents with   Hand Pain    HPI Amanda [name] is a 28 y.o. female.   Patient presents for evaluation of pain and swelling to the right hand present for 5 days.  Occurring intermittently, exacerbated by movement.  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  Denies injury, numbness or tingling.  Has attempted to ice.  Past Medical History:  Diagnosis Date   Angio-edema    BRCA negative 09/2020   MyRisk neg except AXIN2 VUS   Family history of adverse reaction to anesthesia    heart condition   Family history of breast cancer 09/2020   IBIS=15.7%/riskscore=14.7%   Family history of pancreatic cancer    Family history of thyroid  disease    GERD (gastroesophageal reflux disease)    Migraine    Urticaria     Patient Active Problem List   Diagnosis Date Noted   Trauma and stressor-related disorder 11/01/2023   AC joint pain 09/05/2023   Abnormal uterine bleeding 07/19/2023   Hip flexor tendon tightness, left 05/17/2023   Raynaud's syndrome 12/15/2022   Insomnia 07/21/2022   Neck pain 07/10/2022   ANA positive 07/10/2022   Hot flashes 07/10/2022   Sinusitis 07/04/2022   Thyromegaly 07/04/2022   Adenoid vegetations 06/21/2022   MDD (major depressive disorder), recurrent, in full remission (HCC) 06/06/2022   Abscess of groin, right 06/05/2022   Inguinal lymphadenopathy 03/08/2022   MDD (major depressive disorder), recurrent, in partial remission (HCC) 03/06/2022   MDD (major depressive disorder), recurrent episode, mild (HCC) 02/02/2022   Intractable migraine with aura with status migrainosus 01/31/2022   Depression, major, single episode, severe (HCC) 01/31/2022   Cervicogenic headache 12/15/2021   Somatic dysfunction of cervical region 12/15/2021   Neck muscle spasm 11/03/2021    GAD (generalized anxiety disorder) 11/03/2021   History of affective disorder 11/03/2021   CIN I (cervical intraepithelial neoplasia I) 02/14/2021   Genital herpes simplex type 2 02/10/2021   Vitamin D deficiency 06/28/2020   History of chronic sinusitis 06/28/2020   Monochorionic diamniotic twin gestation 05/20/2019   Intractable chronic migraine without aura with status migrainosus 02/11/2017   Vision loss of left eye 01/17/2017   Acne 11/18/2014    Past Surgical History:  Procedure Laterality Date   NO PAST SURGERIES     TONSILLECTOMY Bilateral 09/05/2022   Procedure: TONSILLECTOMY;  Surgeon: Von Grumbling, MD;  Location: [facility];  Service: ENT;  Laterality: Bilateral;   TONSILLECTOMY     WISDOM TOOTH EXTRACTION      OB History     Gravida  1   Para  1   Term  0   Preterm  1   AB  0   Living  2      SAB  0   IAB  0   Ectopic  0   Multiple  1   Live Births  2            Home Medications    Prior to Admission medications   Medication Sig Start Date End Date Taking? Authorizing Provider  predniSONE  (STERAPRED UNI-PAK 21 TAB) 10 MG (21) TBPK tablet Take by mouth daily. Take 6 tabs by mouth daily  for 1 days, then 5 tabs for 1 days, then 4 tabs for  1 days, then 3 tabs for 1 days, 2 tabs for 1 days, then 1 tab by mouth daily for 1 days 04/21/24  Yes Loreli Debruler R, NP  busPIRone  (BUSPAR ) 10 MG tablet TAKE 2 TABLETS BY MOUTH 2 TIMES DAILY. 09/24/23   Eappen, Saramma, MD  Clindamycin -Benzoyl Per, Refr, (DUAC) gel Apply to face qam, wash off qhs 01/31/23   Artemio Larry, MD  escitalopram  (LEXAPRO ) 20 MG tablet Take 1 tablet (20 mg total) by mouth daily. Dose increase 11/01/23   Eappen, Saramma, MD  hydrOXYzine  (VISTARIL ) 25 MG capsule TAKE 1-2 CAPSULES (25-50 MG TOTAL) BY MOUTH AT BEDTIME AS NEEDED. FOR ANXIETY AND SLEEP 02/24/22   Eappen, Saramma, MD  levocetirizine (XYZAL ) 5 MG tablet Take 1 tablet (5 mg total) by mouth in the morning and at  bedtime. 08/02/23   Rochester Chuck, MD  tiZANidine  (ZANAFLEX ) 2 MG tablet TAKE 1 TABLET BY MOUTH AT BEDTIME. 04/07/24   Drubel, Heidi Llamas, PA-C  topiramate  (TOPAMAX ) 25 MG tablet TAKE 3 TABLETS BY MOUTH AT BEDTIME. 11/05/23   Jaffe, Adam R, DO  traZODone  (DESYREL ) 50 MG tablet TAKE 1/2 TABLET BY MOUTH AT BEDTIME AS NEEDED FOR SLEEP 02/05/23   Eappen, Saramma, MD  tretinoin  (RETIN-A ) 0.05 % cream Apply topically at bedtime. 2-3 times a week and then gradually increase frequency 04/09/24   Drubel, Heidi Llamas, PA-C  valACYclovir  (VALTREX ) 1000 MG tablet Take 1 tablet (1,000 mg total) by mouth daily. 10/08/23   Trenton Frock, PA-C  Zavegepant HCl (ZAVZPRET ) 10 MG/ACT SOLN Place 1 spray into the nose daily as needed. Maximum 1 spray in 24 hours. 11/24/22   Merriam Abbey, DO    Family History Family History  Problem Relation Age of Onset   Migraines Mother    Allergic rhinitis Father    Skin cancer Father    Allergic rhinitis Brother    Allergies Brother    Thyroid  disease Maternal Aunt    Breast cancer Maternal Aunt 45   Asthma Maternal Grandmother    Hypertension Maternal Grandmother    Thyroid  disease Maternal Grandmother    Hypertension Maternal Grandfather    Pancreatic cancer Maternal Grandfather 3   Colon cancer Maternal Grandfather 60   Hypertension Paternal Grandmother    Diabetes Paternal Grandmother    Hypertension Paternal Grandfather    Diabetes Paternal Grandfather    Colon cancer Paternal Grandfather 98    Social History Social History   Tobacco Use   Smoking status: Never    Passive exposure: Never   Smokeless tobacco: Never  Vaping Use   Vaping status: Never Used  Substance Use Topics   Alcohol use: Never   Drug use: No     Allergies   Doxycycline , Imitrex  [sumatriptan ], Molds & smuts, Pollen extract-tree extract [pollen extract], Shrimp extract, Tylenol  [acetaminophen ], and Penicillins   Review of Systems Review of Systems   Physical Exam Triage  Vital Signs ED Triage Vitals [04/21/24 1202]  Encounter Vitals Group     BP 103/78     Systolic BP Percentile      Diastolic BP Percentile      Pulse Rate 94     Resp 16     Temp 97.8 F (36.6 C)     Temp Source Temporal     SpO2 98 %     Weight      Height      Head Circumference      Peak Flow      Pain Score 4  Pain Loc      Pain Education      Exclude from Growth Chart    No data found.  Updated Vital Signs BP 103/78 (BP Location: Left Arm)   Pulse 94   Temp 97.8 F (36.6 C) (Temporal)   Resp 16   LMP 04/14/2024 (Approximate)   SpO2 98%   Visual Acuity Right Eye Distance:   Left Eye Distance:   Bilateral Distance:    Right Eye Near:   Left Eye Near:    Bilateral Near:     Physical Exam Constitutional:      Appearance: Normal appearance.  Eyes:     Extraocular Movements: Extraocular movements intact.  Pulmonary:     Effort: Pulmonary effort is normal.  Musculoskeletal:     Comments: Tenderness present at the base of the 2nd and 3rd metacarpal of the right hand with mild swelling, has full range of motion without pain, sensation intact, capillary refill less than 3, 2+ radial pulse  Neurological:     Mental Status: She is alert and oriented to person, place, and time. Mental status is at baseline.      UC Treatments / Results  Labs (all labs ordered are listed, but only abnormal results are displayed) Labs Reviewed - No data to display  EKG   Radiology No results found.  Procedures Procedures (including critical care time)  Medications Ordered in UC Medications - No data to display  Initial Impression / Assessment and Plan / UC Course  I have reviewed the triage vital signs and the nursing notes.  Pertinent labs & imaging results that were available during my care of the patient were reviewed by me and considered in my medical decision making (see chart for details).  Tendonitis of the right hand  Etiology most likely inflammatory  due to overuse, discussed with patient low suspicion for bone involvement therefore imaging deferred, denies injury, declined IM steroid or anti-inflammatory injection, prescribed oral prednisone  as she endorses typically NSAIDs are ineffective with pain management for her, recommended supportive care through [facility] with activity as tolerated and given walking referral to orthopedics, work note given Final Clinical Impressions(s) / UC Diagnoses   Final diagnoses:  Tendonitis of right hand     Discharge Instructions      Your pain is most likely caused by irritation to the tendon that allows for movement due to frequent use  Begin prednisone  every morning with food as directed to reduce inflammation and help with pain, may take Tylenol  or any topical medicines additionally, avoid ibuprofen   You may use heating pad in 15 minute intervals as needed for additional comfort  Begin massage and stretch affected area daily for 10 minutes as tolerated to further loosen muscles   If pain persist after recommended treatment or reoccurs if may be beneficial to follow up with orthopedic specialist for evaluation, this doctor specializes in the bones and can manage your symptoms long-term with options such as but not limited to imaging, medications or physical therapy      ED Prescriptions     Medication Sig Dispense Auth. Provider   predniSONE  (STERAPRED UNI-PAK 21 TAB) 10 MG (21) TBPK tablet Take by mouth daily. Take 6 tabs by mouth daily  for 1 days, then 5 tabs for 1 days, then 4 tabs for 1 days, then 3 tabs for 1 days, 2 tabs for 1 days, then 1 tab by mouth daily for 1 days 21 tablet Hagop Mccollam, Maybelle Spatz, NP  PDMP not reviewed this encounter.   Reena Canning, NP 04/21/24 1217

## 2024-04-21 NOTE — ED Triage Notes (Signed)
 Patient presents to [facility] for right hand pain x 5 days. States employer wants her to be evaluated. 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 Has not taking any OTC pain meds.

## 2024-04-22 ENCOUNTER — Ambulatory Visit: Admitting: Physician Assistant

## 2024-04-28 ENCOUNTER — Ambulatory Visit (INDEPENDENT_AMBULATORY_CARE_PROVIDER_SITE_OTHER): Admitting: Licensed Clinical Social Worker

## 2024-04-28 DIAGNOSIS — F411 Generalized anxiety disorder: Secondary | ICD-10-CM | POA: Diagnosis not present

## 2024-04-28 DIAGNOSIS — F3342 Major depressive disorder, recurrent, in full remission: Secondary | ICD-10-CM | POA: Diagnosis not present

## 2024-04-28 DIAGNOSIS — F439 Reaction to severe stress, unspecified: Secondary | ICD-10-CM
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
## 2024-05-01 ENCOUNTER — Ambulatory Visit: Admitting: Licensed Clinical Social Worker

## 2024-05-05 ENCOUNTER — Ambulatory Visit (INDEPENDENT_AMBULATORY_CARE_PROVIDER_SITE_OTHER): Admitting: Family Medicine

## 2024-05-05 ENCOUNTER — Encounter (INDEPENDENT_AMBULATORY_CARE_PROVIDER_SITE_OTHER): Payer: Self-pay | Admitting: Family Medicine

## 2024-05-05 VITALS — BP 102/68 | HR 71 | Temp 98.4°F | Ht 63.0 in | Wt 168.0 lb

## 2024-05-05 DIAGNOSIS — R0602 Shortness of breath: Secondary | ICD-10-CM | POA: Diagnosis not present

## 2024-05-05 DIAGNOSIS — Z1331 Encounter for screening for depression: Secondary | ICD-10-CM

## 2024-05-05 DIAGNOSIS — F39 Unspecified mood [affective] disorder: Secondary | ICD-10-CM

## 2024-05-05 DIAGNOSIS — G43711 Chronic migraine without aura, intractable, with status migrainosus: Secondary | ICD-10-CM

## 2024-05-05 DIAGNOSIS — R5383 Other fatigue: Secondary | ICD-10-CM

## 2024-05-05 DIAGNOSIS — E669 Obesity, unspecified: Secondary | ICD-10-CM | POA: Diagnosis not present

## 2024-05-05 DIAGNOSIS — Z6829 Body mass index (BMI) 29.0-29.9, adult: Secondary | ICD-10-CM
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
## 2024-05-06 LAB — CBC WITH DIFFERENTIAL/PLATELET
Basophils Absolute: 0.1 10*3/uL (ref 0.0–0.2)
Basos: 0 %
EOS (ABSOLUTE): 0.2 10*3/uL (ref 0.0–0.4)
Eos: 2 %
Hematocrit: 44.1 % (ref 34.0–46.6)
Hemoglobin: 14.4 g/dL (ref 11.1–15.9)
Immature Grans (Abs): 0 10*3/uL (ref 0.0–0.1)
Immature Granulocytes: 0 %
Lymphocytes Absolute: 2.2 10*3/uL (ref 0.7–3.1)
Lymphs: 19 %
MCH: 29.3 pg (ref 26.6–33.0)
MCHC: 32.7 g/dL (ref 31.5–35.7)
MCV: 90 fL (ref 79–97)
Monocytes Absolute: 0.7 10*3/uL (ref 0.1–0.9)
Monocytes: 6 %
Neutrophils Absolute: 8.6 10*3/uL — ABNORMAL HIGH (ref 1.4–7.0)
Neutrophils: 73 %
Platelets: 249 10*3/uL (ref 150–450)
RBC: 4.92 x10E6/uL (ref 3.77–5.28)
RDW: 11.9 % (ref 11.7–15.4)
WBC: 11.8 10*3/uL — ABNORMAL HIGH (ref 3.4–10.8)

## 2024-05-06 LAB — COMPREHENSIVE METABOLIC PANEL WITH GFR
ALT: 6 IU/L (ref 0–32)
AST: 17 IU/L (ref 0–40)
Albumin: 4.8 g/dL (ref 4.0–5.0)
Alkaline Phosphatase: 79 IU/L (ref 44–121)
BUN/Creatinine Ratio: 16 (ref 9–23)
BUN: 12 mg/dL (ref 6–20)
Bilirubin Total: 0.4 mg/dL (ref 0.0–1.2)
CO2: 22 mmol/L (ref 20–29)
Calcium: 9.7 mg/dL (ref 8.7–10.2)
Chloride: 101 mmol/L (ref 96–106)
Creatinine, Ser: 0.74 mg/dL (ref 0.57–1.00)
Globulin, Total: 2.8 g/dL (ref 1.5–4.5)
Glucose: 90 mg/dL (ref 70–99)
Potassium: 4.3 mmol/L (ref 3.5–5.2)
Sodium: 139 mmol/L (ref 134–144)
Total Protein: 7.6 g/dL (ref 6.0–8.5)
eGFR: 114 mL/min/{1.73_m2} (ref >59)

## 2024-05-06 LAB — LIPID PANEL
Chol/HDL Ratio: 2.2 ratio (ref 0.0–4.4)
Cholesterol, Total: 127 mg/dL (ref 100–199)
HDL: 59 mg/dL (ref >39)
LDL Chol Calc (NIH): 55 mg/dL (ref 0–99)
Triglycerides: 62 mg/dL (ref 0–149)
VLDL Cholesterol Cal: 13 mg/dL (ref 5–40)

## 2024-05-06 LAB — HEMOGLOBIN A1C
Est. average glucose Bld gHb Est-mCnc: 103 mg/dL
Hgb A1c MFr Bld: 5.2 % (ref 4.8–5.6)

## 2024-05-06 LAB — TSH: TSH: 1.54 u[IU]/mL (ref 0.450–4.500)

## 2024-05-06 LAB — VITAMIN B12: Vitamin B-12: 618 pg/mL (ref 232–1245)

## 2024-05-06 LAB — INSULIN, RANDOM: INSULIN: 5.8 u[IU]/mL (ref 2.6–24.9)

## 2024-05-06 LAB — FOLATE: Folate: 3.6 ng/mL (ref >3.0)

## 2024-05-06 LAB — T4, FREE: Free T4: 1.33 ng/dL (ref 0.82–1.77)

## 2024-05-06 LAB — VITAMIN D 25 HYDROXY (VIT D DEFICIENCY, FRACTURES): Vit D, 25-Hydroxy: 33.6 ng/mL (ref 30.0–100.0)

## 2024-05-08 ENCOUNTER — Telehealth: Payer: Self-pay

## 2024-05-08 NOTE — Telephone Encounter (Signed)
 Message patient to call and schedule a visit.

## 2024-05-08 NOTE — Telephone Encounter (Signed)
 Pharmacy Patient Advocate Encounter   Received notification from CoverMyMeds that prior authorization for Ubrelvy  100MG  tablets is required/requested.   Insurance verification completed.   The patient is insured through [facility] [place] .   Per test claim: Patient has not been seen in a year and needs updated office visit for documentation. There has not been a fill for either Ubrelvy  or Zavzpret  for over a year. Please verify if patient still using.

## 2024-05-12 ENCOUNTER — Encounter (INDEPENDENT_AMBULATORY_CARE_PROVIDER_SITE_OTHER): Payer: Self-pay | Admitting: Family Medicine

## 2024-05-13 ENCOUNTER — Ambulatory Visit (INDEPENDENT_AMBULATORY_CARE_PROVIDER_SITE_OTHER): Admitting: Licensed Clinical Social Worker

## 2024-05-13 DIAGNOSIS — F3342 Major depressive disorder, recurrent, in full remission: Secondary | ICD-10-CM | POA: Diagnosis not present

## 2024-05-13 DIAGNOSIS — F411 Generalized anxiety disorder: Secondary | ICD-10-CM | POA: Diagnosis not present

## 2024-05-13 DIAGNOSIS — F439 Reaction to severe stress, unspecified: Secondary | ICD-10-CM | POA: Diagnosis not present

## 2024-05-13 NOTE — Telephone Encounter (Signed)
 Left patient a message to call back to schedule a virtual visit with Dr. Melven Stable.

## 2024-05-13 NOTE — Progress Notes (Signed)
 THERAPIST PROGRESS NOTE  Session Time: 2-2:58pm  Participation Level: Active  Behavioral Response: CasualAlertEuthymic  Type of Therapy: Individual Therapy  Treatment Goals addressed:  Goal: LTG: Fern will score less than 5 on the Generalized Anxiety Disorder 7 Scale (GAD-7)          Dates: Start:  11/06/23    Expected End:  04/05/24           Disciplines: Interdisciplinary, PROVIDER                                                                         Goal: STG: Dru will practice problem solving skills 3 times per week for the next 4 weeks.          Dates: Start:  11/06/23    Expected End:  04/05/24           Disciplines: Interdisciplinary, PROVIDER                                                                      Problem: BH CCP Acute or Chronic Trauma Reaction            Dates: Start:  11/06/23              Disciplines: Interdisciplinary, PROVIDER                                                Goal: LTG: Recall traumatic events without becoming overwhelmed with negative emotions         Dates: Start:  11/06/23    Expected End:  04/05/24           Disciplines: Interdisciplinary, PROVIDER                                                                Goal: STG: Dea will practice emotion regulation skills 2 time(s) per week for the next 8 week(s)         Dates: Start:  11/06/23    Expected End:  04/05/24           Disciplines: Interdisciplinary, PROVIDER                                                         Goal: LTG: Pt reports she would like to "Work through all the trauma I experienced through the breakup and with the other person"         Dates: Start:  11/06/23  Expected End:  04/05/24           Disciplines: Interdisciplinary, PROVIDER                                                                Goal: STG: Magally will identify internal and external stimuli that trigger PTSD symptoms         Dates: Start:  11/06/23     Expected End:  04/05/24           Disciplines: Interdisciplinary, PROVIDER              ProgressTowards Goals: Progressing  Interventions: CBT, Solution Focused, Assertiveness Training, Supportive, and Reframing  Summary: Amanda [name] is a 28 y.o. female who presents with symptoms of trauma and anxiety. Patient identifies symptoms to include difficulty within interpersonal relationships, uncontrollable worry, hypervigilance, negative self affect, and irritability. Pt was oriented times 5. Pt was cooperative and engaged. Pt denies SI/HI/AVH.    Patient utilized therapeutic space to process updates within the relationship.  Identified efforts made to establish healthier boundaries and appropriate communication.  Identified use of understanding of love languages to assist in better meeting each other's needs within the marriage.  Patient continued EMDR by completing her target sequence planning for the negative cognition "I am not good enough."  Patient reflected on series of life events that have further instilled this belief acknowledging she has been making efforts on her own to reframe this negative cognition.  Patient reports she feels ready to begin processing next session.  Suicidal/Homicidal: Nowithout intent/plan  Therapist Response: Clinician utilized active and supportive reflection to create a safe space for patient to process recent life events. Clinician assessed for current symptoms, stressors, and safety since last session.  Problem solved ways in which patient can utilize assertive communication as well as establish healthy boundaries within her marriage.  Completed EMDR target sequence plan.    Plan: Return again in 2 weeks.  Diagnosis: GAD (generalized anxiety disorder)  MDD (major depressive disorder), recurrent, in full remission (HCC)  Trauma and stressor-related disorder   Collaboration of Care: AEB psychiatrist can access notes and cln. Will review psychiatrists'  notes. Check in with the patient and will see LCSW per availability. Patient agreed with treatment recommendations.   Patient/Guardian was advised Release of Information must be obtained prior to any record release in order to collaborate their care with an outside provider. Patient/Guardian was advised if they have not already done so to contact the registration department to sign all necessary forms in order for us  to release information regarding their care.   Consent: Patient/Guardian gives verbal consent for treatment and assignment of benefits for services provided during this visit. Patient/Guardian expressed understanding and agreed to proceed.   Marvin Slot, LCSW 05/13/2024

## 2024-05-13 NOTE — Progress Notes (Deleted)
      Established patient visit   Patient: Amanda [name]   DOB: Jul 08, 1996   28 y.o. Female  MRN: [id number] Visit Date: 05/14/2024  Today's healthcare provider: Trenton Frock, PA-C   No chief complaint on file.  Subjective     ***  Medications: Outpatient Medications Prior to Visit  Medication Sig   busPIRone  (BUSPAR ) 10 MG tablet TAKE 2 TABLETS BY MOUTH 2 TIMES DAILY.   Clindamycin -Benzoyl Per, Refr, (DUAC) gel Apply to face qam, wash off qhs   escitalopram  (LEXAPRO ) 20 MG tablet Take 1 tablet (20 mg total) by mouth daily. Dose increase   hydrOXYzine  (VISTARIL ) 25 MG capsule TAKE 1-2 CAPSULES (25-50 MG TOTAL) BY MOUTH AT BEDTIME AS NEEDED. FOR ANXIETY AND SLEEP (Patient not taking: Reported on 05/05/2024)   levocetirizine (XYZAL ) 5 MG tablet Take 1 tablet (5 mg total) by mouth in the morning and at bedtime.   predniSONE  (STERAPRED UNI-PAK 21 TAB) 10 MG (21) TBPK tablet Take by mouth daily. Take 6 tabs by mouth daily  for 1 days, then 5 tabs for 1 days, then 4 tabs for 1 days, then 3 tabs for 1 days, 2 tabs for 1 days, then 1 tab by mouth daily for 1 days (Patient not taking: Reported on 05/05/2024)   topiramate  (TOPAMAX ) 25 MG tablet TAKE 3 TABLETS BY MOUTH AT BEDTIME.   traZODone  (DESYREL ) 50 MG tablet TAKE 1/2 TABLET BY MOUTH AT BEDTIME AS NEEDED FOR SLEEP   tretinoin  (RETIN-A ) 0.05 % cream Apply topically at bedtime. 2-3 times a week and then gradually increase frequency   valACYclovir  (VALTREX ) 1000 MG tablet Take 1 tablet (1,000 mg total) by mouth daily.   Zavegepant HCl (ZAVZPRET ) 10 MG/ACT SOLN Place 1 spray into the nose daily as needed. Maximum 1 spray in 24 hours.   No facility-administered medications prior to visit.    Review of Systems {Insert previous labs (optional):23779} {See past labs  Heme  Chem  Endocrine  Serology  Results Review (optional):1}   Objective    LMP 04/14/2024 (Approximate)  {Insert last BP/Wt (optional):23777}{See vitals history  (optional):1}  Physical Exam  ***  No results found for any visits on 05/14/24.  Assessment & Plan    There are no diagnoses linked to this encounter.  ***  No follow-ups on file.       Trenton Frock, PA-C  [facility] [phone] (phone) [phone] (fax)  [facility]

## 2024-05-14 ENCOUNTER — Ambulatory Visit: Admitting: Physician Assistant

## 2024-05-14 ENCOUNTER — Encounter: Payer: Self-pay | Admitting: Physician Assistant

## 2024-05-14 ENCOUNTER — Ambulatory Visit: Admitting: Psychiatry

## 2024-05-14 VITALS — BP 108/65 | HR 100

## 2024-05-14 DIAGNOSIS — L700 Acne vulgaris: Secondary | ICD-10-CM | POA: Diagnosis not present

## 2024-05-14 DIAGNOSIS — D2239 Melanocytic nevi of other parts of face: Secondary | ICD-10-CM

## 2024-05-14 DIAGNOSIS — L219 Seborrheic dermatitis, unspecified: Secondary | ICD-10-CM | POA: Diagnosis not present

## 2024-05-14 MED ORDER — TRETINOIN 0.1 % EX CREA: TOPICAL_CREAM | Freq: Every day | CUTANEOUS | 3 refills | Status: AC

## 2024-05-14 MED ORDER — KETOCONAZOLE 2 % EX SHAM
1.0000 | MEDICATED_SHAMPOO | Freq: Once | CUTANEOUS | 3 refills | Status: AC
Start: 2024-05-14 — End: 2024-05-14

## 2024-05-14 NOTE — Progress Notes (Signed)
   New Patient Visit   Subjective  Amanda [name] is a 28 y.o. female who presents for the following: acne The patient has acne on her face, back and scalp. Pt has tried adapalene  0.3, Duac, doxycycline , spironolactone , and minocycline  without any benefit. Patient does admit to not being consistent with any/all of the Rx. She was seen in January and the plan was to start isotretinoin (Dr. Artemio Larry) but patient and her PCP did not want to go that route.   ADDITIONAL CONCERNS: itchy flaky scalp and bump on her nose.   The following portions of the chart were reviewed this encounter and updated as appropriate: medications, allergies, medical history  Review of Systems:  No other skin or systemic complaints except as noted in HPI or Assessment and Plan.  Objective  Well appearing patient in no apparent distress; mood and affect are within normal limits.  A focused examination was performed of the following areas: Face, back and scalp  Relevant exam findings are noted in the Assessment and Plan.               Assessment & Plan    ACNE VULGARIS Exam: Open comedones and inflammatory papules on face, back and chest with post-inflammatory hyperpigmentation.  Chronic and persistent condition with duration or expected duration over one year. Condition is bothersome/symptomatic for patient. Currently flared.   Treatment Plan: Increase tretinoin  to 0.1 nightly Use gentle cleanser once a day such as Vanicream gentle face cleanser Use Panoxyl every other morning on face and daily on chest and back.   SEBORRHEIC DERMATITIS Exam: Pink patches with greasy scale at scalp  Chronic and persistent condition with duration or expected duration over one year. Condition is bothersome/symptomatic for patient. Currently flared.   Seborrheic Dermatitis is a chronic persistent rash characterized by pinkness and scaling most commonly of the mid face but also can occur on the scalp (dandruff),  ears; mid chest, mid back and groin.  It tends to be exacerbated by stress and cooler weather.  People who have neurologic disease may experience new onset or exacerbation of existing seborrheic dermatitis.  The condition is not curable but treatable and can be controlled.  Treatment Plan: Ketoconazole shampoo 2-3 times a week     Angiofibroma/Fibrous Papule or Mole on nose - 1-2 mm smooth symmetric flesh colored to pink papule(s) without features suspicious for malignancy on dermoscopy - Benign-appearing.  Observation.  Call clinic for new or changing lesions.     No follow-ups on file.  I, Wilson Hasten, CMA, am acting as scribe for [employer], PA-C.   Documentation: I have reviewed the above documentation for accuracy and completeness, and I agree with the above.  Anterrio Mccleery K, PA-C

## 2024-05-14 NOTE — Progress Notes (Deleted)
 [name] [address] [place] [address] Phone: [phone] Subjective:    I'm seeing this patient by the request  of:  Trenton Frock, PA-C  CC:   [id number]  Amanda [name] is a 28 y.o. female coming in with complaint of back and neck pain. OMT 03/05/2024. Patient states   Medications patient has been prescribed: None  Taking:         Reviewed prior external information including notes and imaging from previsou exam, outside providers and external EMR if available.   As well as notes that were available from care everywhere and other healthcare systems.  Past medical history, social, surgical and family history all reviewed in electronic medical record.  No pertanent information unless stated regarding to the chief complaint.   Past Medical History:  Diagnosis Date   Acne    Angio-edema    Anxiety    Back pain    BRCA negative 09/2020   MyRisk neg except AXIN2 VUS   Family history of adverse reaction to anesthesia    heart condition   Family history of breast cancer 09/2020   IBIS=15.7%/riskscore=14.7%   Family history of pancreatic cancer    Family history of thyroid  disease    GERD (gastroesophageal reflux disease)    Lactose intolerance    Migraine    Multiple food allergies    Urticaria     Allergies  Allergen Reactions   Doxycycline  Other (See Comments)    Exacerbates Migraines   Imitrex  [Sumatriptan ] Swelling    Facial swelling and burning lips   Molds & Smuts    Pollen Extract-Tree Extract [Pollen Extract]    Shrimp Extract    Tylenol  [Acetaminophen ] Other (See Comments)    Causes rebound headaches    Penicillins Hives    Did it involve swelling of the face/tongue/throat, SOB, or low BP? No Did it involve sudden or severe rash/hives, skin peeling, or any reaction on the inside of your mouth or nose? Yes Did you need to seek medical attention at a hospital or doctor's office? Yes When did it last  happen?      childhood allergy  If all above answers are "NO", may proceed with cephalosporin use.     Review of Systems:  No headache, visual changes, nausea, vomiting, diarrhea, constipation, dizziness, abdominal pain, skin rash, fevers, chills, night sweats, weight loss, swollen lymph nodes, body aches, joint swelling, chest pain, shortness of breath, mood changes. POSITIVE muscle aches  Objective  There were no vitals taken for this visit.   General: No apparent distress alert and oriented x3 mood and affect normal, dressed appropriately.  HEENT: Pupils equal, extraocular movements intact  Respiratory: Patient's speak in full sentences and does not appear short of breath  Cardiovascular: No lower extremity edema, non tender, no erythema  Gait MSK:  Back   Osteopathic findings  C2 flexed rotated and side bent right C6 flexed rotated and side bent left T3 extended rotated and side bent right inhaled rib T9 extended rotated and side bent left L2 flexed rotated and side bent right Sacrum right on right       Assessment and Plan:  No problem-specific Assessment & Plan notes found for this encounter.    Nonallopathic problems  Decision today to treat with OMT was based on Physical Exam  After verbal consent patient was treated with HVLA, ME, FPR techniques in cervical, rib, thoracic, lumbar, and sacral  areas  Patient tolerated the procedure well  with improvement in symptoms  Patient given exercises, stretches and lifestyle modifications  See medications in patient instructions if given  Patient will follow up in 4-8 weeks             Note: This dictation was prepared with Dragon dictation along with smaller phrase technology. Any transcriptional errors that result from this process are unintentional.

## 2024-05-14 NOTE — Patient Instructions (Addendum)

## 2024-05-22 ENCOUNTER — Ambulatory Visit: Admitting: Family Medicine

## 2024-05-27 ENCOUNTER — Ambulatory Visit (INDEPENDENT_AMBULATORY_CARE_PROVIDER_SITE_OTHER): Admitting: Licensed Clinical Social Worker

## 2024-05-27 DIAGNOSIS — F439 Reaction to severe stress, unspecified: Secondary | ICD-10-CM

## 2024-05-27 DIAGNOSIS — F3342 Major depressive disorder, recurrent, in full remission: Secondary | ICD-10-CM | POA: Diagnosis not present

## 2024-05-27 DIAGNOSIS — F411 Generalized anxiety disorder: Secondary | ICD-10-CM | POA: Diagnosis not present

## 2024-05-27 NOTE — Progress Notes (Signed)
 THERAPIST PROGRESS NOTE  Session Time: 3-3:58pm  Participation Level: Active  Behavioral Response: CasualAlertEuthymic  Type of Therapy: Individual Therapy  Treatment Goals addressed:  Goal: LTG: Amanda [name] will score less than 5 on the Generalized Anxiety Disorder 7 Scale (GAD-7)          Dates: Start:  11/06/23    Expected End:  04/05/24           Disciplines: Interdisciplinary, PROVIDER                                                                             Goal: STG: Amanda [name] will practice problem solving skills 3 times per week for the next 4 weeks.          Dates: Start:  11/06/23    Expected End:  04/05/24           Disciplines: Interdisciplinary, PROVIDER                                                                                       Problem: BH CCP Acute or Chronic Trauma Reaction             Dates: Start:  11/06/23               Disciplines: Interdisciplinary, PROVIDER                                                 Goal: LTG: Recall traumatic events without becoming overwhelmed with negative emotions         Dates: Start:  11/06/23    Expected End:  04/05/24           Disciplines: Interdisciplinary, PROVIDER                                                                Goal: STG: Amanda [name] will practice emotion regulation skills 2 time(s) per week for the next 8 week(s)         Dates: Start:  11/06/23    Expected End:  04/05/24           Disciplines: Interdisciplinary, PROVIDER                                                                Goal: LTG: Pt reports she  would like to "Work through all the trauma I experienced through the breakup and with the other person"         Dates: Start:  11/06/23    Expected End:  04/05/24           Disciplines: Interdisciplinary, PROVIDER                                                                Goal: STG: Amanda [name] will identify internal and external stimuli that trigger PTSD symptoms          Dates: Start:  11/06/23    Expected End:  04/05/24           Disciplines: Interdisciplinary, PROVIDER               ProgressTowards Goals: Progressing  Interventions: CBT, Assertiveness Training, Supportive, and Reframing  Summary: Amanda [name] is a 28 y.o. female who presents with symptoms of trauma and anxiety. Patient identifies symptoms to include difficulty within interpersonal relationships, uncontrollable worry, hypervigilance, negative self affect, and irritability. Pt was oriented times 5. Pt was cooperative and engaged. Pt denies SI/HI/AVH.    Patient became tearful at the beginning of session citing recent distressing altercation with extended family.  Patient reflected on ways in which she addressed the situation and efforts she made to refrain from discord.  She identified ways in which she feels her boundaries were violated and conversations she continues to have with her husband in an effort to establish transparent communication styles.  Shared stressors related to financial stability and her increased workload.  The clinician and patient continued to reiterate ways in which she can establish healthier boundaries.  Patient reports she feels her self-esteem is low at this time as a result of judgment from others.  Reports she feels critique from others further reiterates negative belief she has about herself especially related to misplaced guilt from her trauma history.  Clinician and patient worked together to reframe these perspectives related to the belief "I am not good enough."  Clinician encouraged patient to establish a self-care routine to ensure she is managing her stress through controllable factors with patient identifying hopes to spend time engaging in self-care after session.  Suicidal/Homicidal: Nowithout intent/plan  Therapist Response: Clinician utilized active and supportive reflection to create a safe space for patient to process recent life events. Clinician  assessed for current symptoms, stressors, and safety since last session.  Problem solved ways in which patient can utilize assertive communication as well as establish healthy boundaries within her marriage.   Plan: Return again in 2 weeks.  Diagnosis: GAD (generalized anxiety disorder)  MDD (major depressive disorder), recurrent, in full remission (HCC)  Trauma and stressor-related disorder   Collaboration of Care: AEB psychiatrist can access notes and cln. Will review psychiatrists' notes. Check in with the patient and will see LCSW per availability. Patient agreed with treatment recommendations.   Patient/Guardian was advised Release of Information must be obtained prior to any record release in order to collaborate their care with an outside provider. Patient/Guardian was advised if they have not already done so to contact the registration department to sign all necessary forms in order for us  to release information regarding their care.   Consent: Patient/Guardian gives verbal consent for  treatment and assignment of benefits for services provided during this visit. Patient/Guardian expressed understanding and agreed to proceed.   Marvin Slot, LCSW 05/27/2024

## 2024-05-28 ENCOUNTER — Encounter (INDEPENDENT_AMBULATORY_CARE_PROVIDER_SITE_OTHER): Payer: Self-pay | Admitting: Family Medicine

## 2024-05-28 ENCOUNTER — Ambulatory Visit (INDEPENDENT_AMBULATORY_CARE_PROVIDER_SITE_OTHER): Admitting: Family Medicine

## 2024-05-28 VITALS — BP 95/60 | HR 81 | Temp 98.7°F | Ht 63.0 in | Wt 174.0 lb

## 2024-05-28 DIAGNOSIS — G43711 Chronic migraine without aura, intractable, with status migrainosus: Secondary | ICD-10-CM

## 2024-05-28 DIAGNOSIS — E88819 Insulin resistance, unspecified: Secondary | ICD-10-CM

## 2024-05-28 DIAGNOSIS — E559 Vitamin D deficiency, unspecified: Secondary | ICD-10-CM

## 2024-05-28 DIAGNOSIS — Z6830 Body mass index (BMI) 30.0-30.9, adult: Secondary | ICD-10-CM

## 2024-05-28 DIAGNOSIS — E669 Obesity, unspecified: Secondary | ICD-10-CM

## 2024-05-28 DIAGNOSIS — F5089 Other specified eating disorder: Secondary | ICD-10-CM

## 2024-05-28 DIAGNOSIS — F39 Unspecified mood [affective] disorder: Secondary | ICD-10-CM | POA: Diagnosis not present

## 2024-05-28 MED ORDER — VITAMIN D (ERGOCALCIFEROL) 1.25 MG (50000 UNIT) PO CAPS: 50000.0000 [IU] | ORAL_CAPSULE | ORAL | 0 refills | Status: DC

## 2024-05-28 NOTE — Progress Notes (Signed)
 Amanda [name], D.O.  ABFM, ABOM Clinical Bariatric Medicine Physician  Office located at: [address] [place], [place]  [address]   Assessment and Plan:  No orders of the defined types were placed in this encounter.  There are no discontinued medications.   Meds ordered this encounter  Medications   Vitamin D , Ergocalciferol , (DRISDOL ) 1.25 MG (50000 UNIT) CAPS capsule    Sig: Take 1 capsule (50,000 Units total) by mouth every 7 (seven) days.    Dispense:  4 capsule    Refill:  0      FOR THE DISEASE OF OBESITY: BMI 30.0-30.9,adult -- Curent BMI 30.83 Obesity (BMI 30-39.9) Assessment & Plan: Since last office visit on 05/05/24 patient's muscle mass did not change; stable at 103.8 lbs. Fat mass has increased by 5.2 lbs. Total body water has increased by 2.4 lbs.  Counseling done on how various foods will affect these numbers and how to maximize success  Total lbs lost to date: gained 6 lbs Total weight loss percentage to date: +3.57%     Recommended Dietary Goals Amanda [name] is currently in the action stage of change. As such, her goal is to continue weight management plan.  She has agreed to: Continue CAT 2 MP with L options and 6 ounces of lean protein at lunch.  Pt to consider journaling in the future. For now, we agreed she is to focus on meal plan implementation.   Behavioral Intervention We discussed the following today: increasing lean protein intake to established goals, decreasing simple carbohydrates , avoiding skipping meals, work on tracking and journaling calories using tracking application, and work on managing stress, creating time for self-care and relaxation  Pt asked about lung cancer screening CT that was recently done. We see results in EHR, however, we recommend pt follow up with her specialist to discuss results.   Additional resources provided today: Handout on CAT 1-2 lunch options and Handout on insulin  resistance education  Evidence-based  interventions for health behavior change were utilized today including the discussion of self monitoring techniques, problem-solving barriers and SMART goal setting techniques.  Regarding patient's less desirable eating habits and patterns, we employed the technique of small changes.   Pt will specifically work on: Continue working on following her meal plan for next OV   Recommended Physical Activity Goals Amanda [name] has been advised to work up to 150 minutes of moderate intensity aerobic activity a week and strengthening exercises 2-3 times per week for cardiovascular health, weight loss maintenance and preservation of muscle mass.   She has agreed to: Increase the intensity, frequency or duration of strengthening exercises    Pharmacotherapy We discussed various medication options to help Amanda [name] with her weight loss efforts and we both agreed to : Continue with current nutritional and behavioral strategies   ASSOCIATED CONDITIONS ADDRESSED TODAY: Mood disorder (HCC) - emotional eating Assessment & Plan: Moods overall stable. Denies any SI/HI. She is established with a behavioral health specialist. Pt was also referred to Dr. Sharron at [facility] on 05/05/24 and has an appt scheduled on 6/17. Continue to f/u with current therapist as well as Dr. Sharron as instructed by them. Briefly discussed stress management strategies including exercise and 4-7-8 breathing method. Will continue monitoring.    Insulin  resistance  Assessment & Plan: Lab Results  Component Value Date   HGBA1C 5.2 05/05/2024   HGBA1C 5.3 01/07/2015   INSULIN  5.8 05/05/2024    Very slight insulin  resistance. Reviewed ideal insulin  goal of  less than 5. Also reviewed other labs, which were grossly normal. I counseled patient on pathophysiology of the disease process of I.R and stressed importance of dietary and lifestyle modifications to result in weight loss. Explained role of simple carbs and insulin  levels on hunger and cravings. All  concerns/questions addressed. Decrease simple carbs/ sugars; increase fiber and proteins -> follow her meal plan. Pt encouraged to continue to work on weight loss via exercise and their meal plan we devised to help decrease the risk of progressing to diabetes. Handouts provided at pt's request after education provided. We will recheck A1c and fasting insulin  level in approximately 3 months from last check, or as deemed appropriate. Pt verbalized understanding/agreement to the above plan.    Vitamin D  deficiency Assessment & Plan: Lab Results  Component Value Date   VD25OH 33.6 05/05/2024   VD25OH 27.3 (L) 06/28/2020   No meds currently. Last vit D levels were suboptimal at 33.6 on 05/05/24. Pt avoids excessive time in the sun, stating skin cancer runs in her family. She tends to wear sunscreen and protect herself. Start vit D supplementation, ERGO 50K units once weekly. Will recheck vit D levels every 3 months.   Orders: - Start ERGO once weekly.    Intractable chronic migraine without aura with status migrainosus Assessment & Plan: Pt is on Topiramate  25 mg 3 tablets daily and Zavpret prn. States she has recently been having migraines more frequently, which she attributes to starting her menses soon. Continue with current med regimen as prescribed.     FOLLOW UP:   Return in about 13 days (around 06/10/2024) for 2 week f/u.[name] She was informed of the importance of frequent follow up visits to maximize her success with intensive lifestyle modifications for her multiple health conditions.  Subjective:   Chief complaint: Obesity Amanda [name] is here to discuss her progress with her obesity treatment plan. She is on the CAT 2 MP w/ L options and 6 ounces of lean protein at lunch and states she is following her eating plan approximately 90% of the time. She states she is exercising (cardio) 30 minutes and 30 minutes of strength training 4 days per week. She states she has decreased strength training  and has increased cardio.   Interval History:  Amanda [name] is here today for her first follow-up office visit since starting the program with us . Since last OV, pt is up 6 lbs. She initially struggled with her meal plan for the first few days as she was still working on grocery shopping for on-plan foods. Pt has been working on prioritizing her lean protein intake 93% lean ground beef, chicken, [place], eggs, and non fat [place] yogurt. Has been measuring her foods. She typically packs a sandwich for lunch with 6 ounces of protein at lunch. Skips lunch at times. Snacks on a beef stick of fruits including banana, apple, oranges, etc. She ensures her snacks are under 200 calories. Reports eating out a few times, but tried to prioritize healthier alternatives such as salads and proteins. She also reports being sick with a sinus infection which also made it difficult for her to exercise for some time.   All blood work/ lab tests that were recently ordered by myself or an outside provider were reviewed with patient today per their request. Extended time was spent counseling her on all new disease processes that were discovered or preexisting ones that are affected by BMI.  she understands that many of these abnormalities will need to  monitored regularly along with the current treatment plan of prudent dietary changes, in which we are making each and every office visit, to improve these health parameters.  Pharmacotherapy for weight loss: She is currently taking Topamax  for migraines and medical weight loss.    Review of Systems:  Pertinent positives were addressed with patient today. Reviewed by clinician on day of visit: allergies, medications, problem list, medical history, surgical history, family history, social history, and previous encounter notes.  Weight Summary and Biometrics   Weight Lost Since Last Visit: 0lb  Weight Gained Since Last Visit: 6lb   Vitals Temp: 98.7 F (37.1 C) BP:  95/60 Pulse Rate: 81 SpO2: 100 %   Anthropometric Measurements Height: 5' 3 (1.6 m) Weight: 174 lb (78.9 kg) BMI (Calculated): 30.83 Weight at Last Visit: 168lb Weight Lost Since Last Visit: 0lb Weight Gained Since Last Visit: 6lb Starting Weight: 168lb Total Weight Loss (lbs): 0 lb (0 kg) Peak Weight: 180lb Waist Measurement : 31 inches   Body Composition  Body Fat %: 37.1 % Fat Mass (lbs): 64.6 lbs Muscle Mass (lbs): 103.8 lbs Total Body Water (lbs): 75.4 lbs Visceral Fat Rating : 6   Other Clinical Data RMR: 2016 Fasting: No Labs: No Today's Visit #: 2 Starting Date: 05/05/24 Comments: second visit     Objective:   PHYSICAL EXAM:  Blood pressure 95/60, pulse 81, temperature 98.7 F (37.1 C), height 5' 3 (1.6 m), weight 174 lb (78.9 kg), SpO2 100%. Body mass index is 30.82 kg/m.  General: she is overweight, cooperative and in no acute distress.   HEENT: EOMI, sclerae are anicteric. Lungs: Normal breathing effort, no conversational dyspnea. M-Sk:  Normal gross ROM * 4 extremities  PSYCH: Has normal mood, affect and thought process. Neurologic: No gross sensory or motor deficits. Well developed, A and O * 3  DIAGNOSTIC DATA REVIEWED:  BMET    Component Value Date/Time   NA 139 05/05/2024 1013   K 4.3 05/05/2024 1013   CL 101 05/05/2024 1013   CO2 22 05/05/2024 1013   GLUCOSE 90 05/05/2024 1013   GLUCOSE 82 03/14/2024 1626   BUN 12 05/05/2024 1013   CREATININE 0.74 05/05/2024 1013   CREATININE 0.79 03/14/2024 1626   CALCIUM 9.7 05/05/2024 1013   GFRNONAA >60 08/18/2023 1431   GFRAA 131 06/28/2020 0930   Lab Results  Component Value Date   HGBA1C 5.2 05/05/2024   HGBA1C 5.3 01/07/2015   Lab Results  Component Value Date   INSULIN  5.8 05/05/2024   Lab Results  Component Value Date   TSH 1.540 05/05/2024   CBC    Component Value Date/Time   WBC 11.8 (H) 05/05/2024 1013   WBC 10.2 08/18/2023 1431   RBC 4.92 05/05/2024 1013   RBC  4.15 08/18/2023 1431   HGB 14.4 05/05/2024 1013   HCT 44.1 05/05/2024 1013   PLT 249 05/05/2024 1013   MCV 90 05/05/2024 1013   MCH 29.3 05/05/2024 1013   MCH 29.9 08/18/2023 1431   MCHC 32.7 05/05/2024 1013   MCHC 33.9 08/18/2023 1431   RDW 11.9 05/05/2024 1013   Iron Studies No results found for: IRON, TIBC, FERRITIN, IRONPCTSAT Lipid Panel     Component Value Date/Time   CHOL 127 05/05/2024 1013   TRIG 62 05/05/2024 1013   HDL 59 05/05/2024 1013   CHOLHDL 2.2 05/05/2024 1013   LDLCALC 55 05/05/2024 1013   Hepatic Function Panel     Component Value Date/Time   PROT 7.6  05/05/2024 1013   ALBUMIN 4.8 05/05/2024 1013   AST 17 05/05/2024 1013   ALT 6 05/05/2024 1013   ALKPHOS 79 05/05/2024 1013   BILITOT 0.4 05/05/2024 1013   BILIDIR 0.2 08/18/2023 1431   IBILI 0.8 08/18/2023 1431      Component Value Date/Time   TSH 1.540 05/05/2024 1013   Nutritional Lab Results  Component Value Date   VD25OH 33.6 05/05/2024   VD25OH 27.3 (L) 06/28/2020    Attestations:   LILLETTE Vernell Forest, acting as a medical scribe for Amanda Jenkins, DO., have compiled all relevant documentation for today's office visit on behalf of Amanda Jenkins, DO, while in the presence of [employer], DO.  I have spent 47 minutes in the care of the patient today.  42 minutes was spent in direct face-to-face counseling of pt on new medical conditions of vitamin D  deficiency and insulin  resistance as well as explaining how foods will effect her various labs that were obtained. Detailed review of current nutritional plan was discussed and details on how to read nutritional labels (I.e. how to read net carbs etc.) This was all in addition to the counseling and coaching documented above.   Reviewed listed medical problems above as outlined in office visit note, providing nutritional and behavioral counseling as outlined in obesity care plan, independently interpreting results and goals of care, see  listed medical problems, and discussing biometric information and progress. We reviewed her meal plan and discussed how the foods she is eating is affecting each one of her labs. Pt educated on why we want her to eat various foods in various amounts and has a better understanding of the nutritional plan because of this. All her questions were answered today.   I have reviewed the above documentation for accuracy and completeness, and I agree with the above. Amanda Amanda [name], D.O.  The 21st Century Cures Act was signed into law in 2016 which includes the topic of electronic health records.  This provides immediate access to information in MyChart.  This includes consultation notes, operative notes, office notes, lab results and pathology reports.  If you have any questions about what you read please let us  know at your next visit so we can discuss your concerns and take corrective action if need be.  We are right here with you.

## 2024-06-03 ENCOUNTER — Telehealth (INDEPENDENT_AMBULATORY_CARE_PROVIDER_SITE_OTHER): Admitting: Psychology

## 2024-06-03 DIAGNOSIS — F5089 Other specified eating disorder: Secondary | ICD-10-CM | POA: Diagnosis not present

## 2024-06-03 DIAGNOSIS — F439 Reaction to severe stress, unspecified: Secondary | ICD-10-CM
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
## 2024-06-10 ENCOUNTER — Encounter (INDEPENDENT_AMBULATORY_CARE_PROVIDER_SITE_OTHER): Payer: Self-pay | Admitting: Family Medicine

## 2024-06-10 ENCOUNTER — Ambulatory Visit (INDEPENDENT_AMBULATORY_CARE_PROVIDER_SITE_OTHER): Admitting: Family Medicine

## 2024-06-10 VITALS — BP 106/69 | HR 80 | Temp 99.0°F | Ht 63.0 in | Wt 176.0 lb

## 2024-06-10 DIAGNOSIS — E559 Vitamin D deficiency, unspecified: Secondary | ICD-10-CM

## 2024-06-10 DIAGNOSIS — Z6831 Body mass index (BMI) 31.0-31.9, adult: Secondary | ICD-10-CM

## 2024-06-10 DIAGNOSIS — Z6829 Body mass index (BMI) 29.0-29.9, adult: Secondary | ICD-10-CM

## 2024-06-10 DIAGNOSIS — E88819 Insulin resistance, unspecified: Secondary | ICD-10-CM

## 2024-06-10 MED ORDER — VITAMIN D (ERGOCALCIFEROL) 1.25 MG (50000 UNIT) PO CAPS: 50000.0000 [IU] | ORAL_CAPSULE | ORAL | 0 refills | Status: DC

## 2024-06-10 NOTE — Progress Notes (Signed)
 Amanda DOROTHA [name], D.O.  ABFM, ABOM Specializing in Clinical Bariatric Medicine  Office located at: [address] [place], [place]  [address]   Assessment and Plan:   Medications Discontinued During This Encounter  Medication Reason   Vitamin D , Ergocalciferol , (DRISDOL ) 1.25 MG (50000 UNIT) CAPS capsule Reorder     Meds ordered this encounter  Medications   Vitamin D , Ergocalciferol , (DRISDOL ) 1.25 MG (50000 UNIT) CAPS capsule    Sig: Take 1 capsule (50,000 Units total) by mouth every 7 (seven) days.    Dispense:  4 capsule    Refill:  0     FOR THE DISEASE OF OBESITY:  BMI 31.0-31.9,adult current 31.18 Starting BMI 29.0-29.9,adult 29.98 Assessment & Plan: Since last office visit on 05/28/2024 patient's  Muscle mass has decreased by 1 lb. Fat mass has increased by 3.6 lb. Total body water has increased by 3.8 lb.  Counseling done on how various foods will affect these numbers and how to maximize success  Total lbs lost to date: + 8 lbs Total weight loss percentage to date: + 4.76%    Recommended Dietary Goals Amanda [name] is currently in the action stage of change. As such, her goal is to continue weight management plan.  She has agreed to START journaling 1450-1550 calories and 100+ grams protein daily with the CAT 2 MP as a guide.    Behavioral Intervention We discussed the following today: increasing water intake, increasing lean protein intake to established goals, work on tracking and journaling calories using tracking application, and decreasing eating out or consumption of processed foods  Additional resources provided today: Handout on balanced plate concepts.  , Handout on complex carbohydrates and lean sources of protein, Handout on reduced calorie nutrition plan concepts, Handout on CAT 2 meal plan, and Handout on Daily Food Journaling Log  Evidence-based interventions for health behavior change were utilized today including the discussion of self monitoring  techniques, problem-solving barriers and SMART goal setting techniques.   Regarding patient's less desirable eating habits and patterns, we employed the technique of small changes.   Pt will specifically work on : n/a   Recommended Physical Activity Goals Amanda [name] has been advised to work up to 300-450 minutes of moderate intensity aerobic activity a week and strengthening exercises 2-3 times per week for cardiovascular health, weight loss maintenance and preservation of muscle mass.   She has agreed to : continue to gradually increase the amount and intensity of exercise routine   Pharmacotherapy We both agreed to continue same regimen.    ASSOCIATED CONDITIONS ADDRESSED TODAY:   Insulin  resistance Assessment & Plan: Lab Results  Component Value Date   HGBA1C 5.2 05/05/2024   HGBA1C 5.3 01/07/2015   INSULIN  5.8 05/05/2024    Very slight insulin  resistance. Hunger and cravings are stable. Continue working on nutrition plan to decrease simple carbohydrates, increase lean proteins and exercise to promote weight loss, improve I.R, and prevent progression to prediabetes.    Vitamin D  deficiency Assessment & Plan: Lab Results  Component Value Date   VD25OH 33.6 05/05/2024   VD25OH 27.3 (L) 06/28/2020   Currently on ergocalciferol  50,000 units weekly without any adverse effects such as nausea, vomiting or muscle weakness. Continue vitamin D  supplementation. Recheck periodically    Of note, she mentions experiencing increased bloating and discomfort which has been an ongoing issue prior to starting her meal plan. Recommend she f/up with her PCP and potentially obtain a referral to GI.     Follow  up:   Return on 06/25/2024 at 10:40 AM. She was informed of the importance of frequent follow up visits to maximize her success with intensive lifestyle modifications for her multiple health conditions.   Subjective:   Chief complaint: Obesity Amanda [name] is here to discuss her progress  with her obesity treatment plan. She is on the CAT 2 MP with L options and 6 ounces of lean protein at lunch and states she is following her eating plan approximately 90% of the time. She states she is  doing cardio and weight training 30-45 minutes 5 days per week.   Interval History:  Amanda [name] is here for a follow up office visit. Since last OV on 05/28/2024, Amanda [name] is up 2 lbs. She is not quite sure why she gained weight. Reports 90% adherence to her MP. She is open to journaling her intake. Is working with Dr.Barker on mindful eating.    Pharmacotherapy that aid with weight loss: She is currently taking Topiramate  25 mg three tablets daily.    Review of Systems:  Pertinent positives were addressed with patient today.  Reviewed by clinician on day of visit: allergies, medications, problem list, medical history, surgical history, family history, social history, and previous encounter notes.  Weight Summary and Biometrics   Weight Lost Since Last Visit: 0lb  Weight Gained Since Last Visit: 2lb   Vitals Temp: 99 F (37.2 C) BP: 106/69 Pulse Rate: 80 SpO2: 98 %   Anthropometric Measurements Height: 5' 3 (1.6 m) Weight: 176 lb (79.8 kg) BMI (Calculated): 31.18 Weight at Last Visit: 168lb Weight Lost Since Last Visit: 0lb Weight Gained Since Last Visit: 2lb Starting Weight: 168lb Total Weight Loss (lbs): 0 lb (0 kg) Peak Weight: 180lb Waist Measurement : 31 inches   Body Composition  Body Fat %: 38.7 % Fat Mass (lbs): 68.2 lbs Muscle Mass (lbs): 102.8 lbs Total Body Water (lbs): 79.2 lbs Visceral Fat Rating : 6   Other Clinical Data RMR: 2016 Fasting: No Labs: no Today's Visit #: 3 Starting Date: 05/05/24 Comments: third visit    Objective:   PHYSICAL EXAM: Blood pressure 106/69, pulse 80, temperature 99 F (37.2 C), height 5' 3 (1.6 m), weight 176 lb (79.8 kg), SpO2 98%. Body mass index is 31.18 kg/m.  General: she is overweight, cooperative  and in no acute distress. PSYCH: Has normal mood, affect and thought process.   HEENT: EOMI, sclerae are anicteric. Lungs: Normal breathing effort, no conversational dyspnea. Extremities: Moves * 4 Neurologic: A and O * 3, good insight  DIAGNOSTIC DATA REVIEWED: BMET    Component Value Date/Time   NA 139 05/05/2024 1013   K 4.3 05/05/2024 1013   CL 101 05/05/2024 1013   CO2 22 05/05/2024 1013   GLUCOSE 90 05/05/2024 1013   GLUCOSE 82 03/14/2024 1626   BUN 12 05/05/2024 1013   CREATININE 0.74 05/05/2024 1013   CREATININE 0.79 03/14/2024 1626   CALCIUM 9.7 05/05/2024 1013   GFRNONAA >60 08/18/2023 1431   GFRAA 131 06/28/2020 0930   Lab Results  Component Value Date   HGBA1C 5.2 05/05/2024   HGBA1C 5.3 01/07/2015   Lab Results  Component Value Date   INSULIN  5.8 05/05/2024   Lab Results  Component Value Date   TSH 1.540 05/05/2024   CBC    Component Value Date/Time   WBC 11.8 (H) 05/05/2024 1013   WBC 10.2 08/18/2023 1431   RBC 4.92 05/05/2024 1013   RBC 4.15 08/18/2023 1431   HGB  14.4 05/05/2024 1013   HCT 44.1 05/05/2024 1013   PLT 249 05/05/2024 1013   MCV 90 05/05/2024 1013   MCH 29.3 05/05/2024 1013   MCH 29.9 08/18/2023 1431   MCHC 32.7 05/05/2024 1013   MCHC 33.9 08/18/2023 1431   RDW 11.9 05/05/2024 1013   Iron Studies No results found for: IRON, TIBC, FERRITIN, IRONPCTSAT Lipid Panel     Component Value Date/Time   CHOL 127 05/05/2024 1013   TRIG 62 05/05/2024 1013   HDL 59 05/05/2024 1013   CHOLHDL 2.2 05/05/2024 1013   LDLCALC 55 05/05/2024 1013   Hepatic Function Panel     Component Value Date/Time   PROT 7.6 05/05/2024 1013   ALBUMIN 4.8 05/05/2024 1013   AST 17 05/05/2024 1013   ALT 6 05/05/2024 1013   ALKPHOS 79 05/05/2024 1013   BILITOT 0.4 05/05/2024 1013   BILIDIR 0.2 08/18/2023 1431   IBILI 0.8 08/18/2023 1431      Component Value Date/Time   TSH 1.540 05/05/2024 1013   Nutritional Lab Results  Component Value  Date   VD25OH 33.6 05/05/2024   VD25OH 27.3 (L) 06/28/2020    Attestations:   I, Special Puri, acting as a Stage manager for Amanda Jenkins, DO., have compiled all relevant documentation for today's office visit on behalf of Amanda Jenkins, DO, while in the presence of [employer], DO.  Reviewed by clinician on day of visit: allergies, medications, problem list, medical history, surgical history, family history, social history, and previous encounter notes pertinent to patient's obesity diagnosis.  I have reviewed the above documentation for accuracy and completeness, and I agree with the above. Amanda JINNY [name], D.O.  The 21st Century Cures Act was signed into law in 2016 which includes the topic of electronic health records.  This provides immediate access to information in MyChart.  This includes consultation notes, operative notes, office notes, lab results and pathology reports.  If you have any questions about what you read please let us  know at your next visit so we can discuss your concerns and take corrective action if need be.  We are right here with you.

## 2024-06-11 ENCOUNTER — Other Ambulatory Visit: Payer: Self-pay | Admitting: Psychiatry

## 2024-06-11 DIAGNOSIS — F411 Generalized anxiety disorder: Secondary | ICD-10-CM

## 2024-06-12 ENCOUNTER — Ambulatory Visit: Admitting: Licensed Clinical Social Worker

## 2024-06-17 ENCOUNTER — Telehealth (INDEPENDENT_AMBULATORY_CARE_PROVIDER_SITE_OTHER): Admitting: Psychology

## 2024-06-17 DIAGNOSIS — F439 Reaction to severe stress, unspecified: Secondary | ICD-10-CM | POA: Diagnosis not present

## 2024-06-17 DIAGNOSIS — F5089 Other specified eating disorder: Secondary | ICD-10-CM
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
## 2024-06-20 ENCOUNTER — Other Ambulatory Visit (INDEPENDENT_AMBULATORY_CARE_PROVIDER_SITE_OTHER): Payer: Self-pay | Admitting: Family Medicine

## 2024-06-24 ENCOUNTER — Ambulatory Visit (INDEPENDENT_AMBULATORY_CARE_PROVIDER_SITE_OTHER): Admitting: Licensed Clinical Social Worker

## 2024-06-24 DIAGNOSIS — F3342 Major depressive disorder, recurrent, in full remission: Secondary | ICD-10-CM | POA: Diagnosis not present

## 2024-06-24 DIAGNOSIS — F439 Reaction to severe stress, unspecified: Secondary | ICD-10-CM | POA: Diagnosis not present

## 2024-06-24 DIAGNOSIS — F411 Generalized anxiety disorder: Secondary | ICD-10-CM
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
## 2024-06-25 ENCOUNTER — Ambulatory Visit (INDEPENDENT_AMBULATORY_CARE_PROVIDER_SITE_OTHER): Admitting: Family Medicine

## 2024-06-25 ENCOUNTER — Encounter (INDEPENDENT_AMBULATORY_CARE_PROVIDER_SITE_OTHER): Payer: Self-pay | Admitting: Family Medicine

## 2024-06-25 VITALS — BP 107/72 | HR 77 | Temp 98.5°F | Ht 63.0 in | Wt 176.0 lb

## 2024-06-25 DIAGNOSIS — Z6829 Body mass index (BMI) 29.0-29.9, adult: Secondary | ICD-10-CM

## 2024-06-25 DIAGNOSIS — E88819 Insulin resistance, unspecified: Secondary | ICD-10-CM | POA: Diagnosis not present

## 2024-06-25 DIAGNOSIS — G43711 Chronic migraine without aura, intractable, with status migrainosus: Secondary | ICD-10-CM

## 2024-06-25 DIAGNOSIS — E559 Vitamin D deficiency, unspecified: Secondary | ICD-10-CM

## 2024-06-25 DIAGNOSIS — Z6831 Body mass index (BMI) 31.0-31.9, adult: Secondary | ICD-10-CM

## 2024-06-25 DIAGNOSIS — F5089 Other specified eating disorder: Secondary | ICD-10-CM | POA: Diagnosis not present

## 2024-06-25 DIAGNOSIS — F39 Unspecified mood [affective] disorder: Secondary | ICD-10-CM

## 2024-06-25 DIAGNOSIS — E669 Obesity, unspecified: Secondary | ICD-10-CM

## 2024-06-25 MED ORDER — VITAMIN D (ERGOCALCIFEROL) 1.25 MG (50000 UNIT) PO CAPS: 50000.0000 [IU] | ORAL_CAPSULE | ORAL | 0 refills | Status: DC

## 2024-07-01 NOTE — Progress Notes (Deleted)
 [name] [address] [place] [address] Phone: [phone] Subjective:    I'm seeing this patient by the request  of:  Cyndi Shaver, PA-C  CC:   YEP:Dlagzrupcz  Amanda [name] is a 28 y.o. female coming in with complaint of back and neck pain. OMT on 03/05/2024. Patient states   Medications patient has been prescribed:   Taking:         Reviewed prior external information including notes and imaging from previsou exam, outside providers and external EMR if available.   As well as notes that were available from care everywhere and other healthcare systems.  Past medical history, social, surgical and family history all reviewed in electronic medical record.  No pertanent information unless stated regarding to the chief complaint.   Past Medical History:  Diagnosis Date   Acne    Angio-edema    Anxiety    Back pain    BRCA negative 09/2020   MyRisk neg except AXIN2 VUS   Family history of adverse reaction to anesthesia    heart condition   Family history of breast cancer 09/2020   IBIS=15.7%/riskscore=14.7%   Family history of pancreatic cancer    Family history of thyroid  disease    GERD (gastroesophageal reflux disease)    Lactose intolerance    Migraine    Multiple food allergies    Urticaria     Allergies  Allergen Reactions   Doxycycline  Other (See Comments)    Exacerbates Migraines   Imitrex  [Sumatriptan ] Swelling    Facial swelling and burning lips   Molds & Smuts    Pollen Extract-Tree Extract [Pollen Extract]    Shrimp Extract    Tylenol  [Acetaminophen ] Other (See Comments)    Causes rebound headaches    Penicillins Hives    Did it involve swelling of the face/tongue/throat, SOB, or low BP? No Did it involve sudden or severe rash/hives, skin peeling, or any reaction on the inside of your mouth or nose? Yes Did you need to seek medical attention at a hospital or doctor's office? Yes When did it last  happen?      childhood allergy  If all above answers are "NO", may proceed with cephalosporin use.     Review of Systems:  No headache, visual changes, nausea, vomiting, diarrhea, constipation, dizziness, abdominal pain, skin rash, fevers, chills, night sweats, weight loss, swollen lymph nodes, body aches, joint swelling, chest pain, shortness of breath, mood changes. POSITIVE muscle aches  Objective  There were no vitals taken for this visit.   General: No apparent distress alert and oriented x3 mood and affect normal, dressed appropriately.  HEENT: Pupils equal, extraocular movements intact  Respiratory: Patient's speak in full sentences and does not appear short of breath  Cardiovascular: No lower extremity edema, non tender, no erythema  Gait MSK:  Back   Osteopathic findings  C2 flexed rotated and side bent right C6 flexed rotated and side bent left T3 extended rotated and side bent right inhaled rib T9 extended rotated and side bent left L2 flexed rotated and side bent right Sacrum right on right       Assessment and Plan:  No problem-specific Assessment & Plan notes found for this encounter.    Nonallopathic problems  Decision today to treat with OMT was based on Physical Exam  After verbal consent patient was treated with HVLA, ME, FPR techniques in cervical, rib, thoracic, lumbar, and sacral  areas  Patient tolerated the procedure  well with improvement in symptoms  Patient given exercises, stretches and lifestyle modifications  See medications in patient instructions if given  Patient will follow up in 4-8 weeks             Note: This dictation was prepared with Dragon dictation along with smaller phrase technology. Any transcriptional errors that result from this process are unintentional.

## 2024-07-07 ENCOUNTER — Ambulatory Visit (INDEPENDENT_AMBULATORY_CARE_PROVIDER_SITE_OTHER): Admitting: Psychiatry

## 2024-07-07 ENCOUNTER — Other Ambulatory Visit: Payer: Self-pay

## 2024-07-07 ENCOUNTER — Encounter: Payer: Self-pay | Admitting: Psychiatry

## 2024-07-07 VITALS — BP 101/68 | HR 87 | Temp 97.7°F | Ht 63.0 in | Wt 181.0 lb

## 2024-07-07 DIAGNOSIS — F439 Reaction to severe stress, unspecified: Secondary | ICD-10-CM | POA: Diagnosis not present

## 2024-07-07 DIAGNOSIS — F3342 Major depressive disorder, recurrent, in full remission: Secondary | ICD-10-CM | POA: Diagnosis not present

## 2024-07-07 DIAGNOSIS — F411 Generalized anxiety disorder: Secondary | ICD-10-CM | POA: Diagnosis not present

## 2024-07-07 DIAGNOSIS — G4701 Insomnia due to medical condition: Secondary | ICD-10-CM

## 2024-07-07 NOTE — Progress Notes (Signed)
 [facility] MD OP Progress Note  07/07/2024 1:59 PM Amanda [name]  MRN:  [id number]  Chief Complaint:  Chief Complaint  Patient presents with   Follow-up   Anxiety   Depression   Medication Refill   Discussed the use of AI scribe software for clinical note transcription with the patient, who gave verbal consent to proceed.  History of Present Illness Amanda [name] is a 28 year old female, married, lives in [place] employed,, has a history of GAD, MDD, insomnia was evaluated in office today for a follow-up appointment.  She recently returned from a honeymoon trip to [place], where she forgot to bring her medication. During the trip, she experienced severe headaches and migraines, which she attributes to missing her medication. Upon returning home, she resumed her medication regimen, including Buspirone , which she had with her during the trip. She missed her Lexapro , which was left in her car, but resumed it upon returning home.  She experienced a crying spell upon returning from her trip, feeling overwhelmed by the transition back to routine life and responsibilities. She is currently taking Buspirone  and Lexapro  for anxiety management.  Her sleep has been improving with the use of Trazodone  as needed, particularly after stressful days. She uses a bed with white noise features to aid her sleep and is mindful of getting eight hours of sleep. Sleep hygiene has been better.  She is working on managing her weight and prediabetes with the help of a healthy weight provider. She follows a diet plan that includes more protein and a specific calorie intake. Her hemoglobin A1c was noted to be 5.2. She reported being told by her healthy weight doctor that she was on the line of being prediabetic. She is mindful of her diet and portion sizes, especially after her trip to [place] where she observed healthier portion sizes and food options.  She works three days a week and plans to increase to four days when her  children start school in September. She manages a busy schedule, including work and childcare responsibilities, which can be overwhelming at times.    Visit Diagnosis:    ICD-10-CM   1. GAD (generalized anxiety disorder)  F41.1     2. MDD (major depressive disorder), recurrent, in full remission (HCC)  F33.42     3. Insomnia due to medical condition  G47.01    Mood symptoms, children needing help    4. Trauma and stressor-related disorder  F43.9    Unspecified, rule out PTSD      Past Psychiatric History: I have reviewed past psychiatric history from progress note on 02/02/2022.  Past trials of Zoloft -did not work, Effexor -made her suicidal.  Past Medical History:  Past Medical History:  Diagnosis Date   Acne    Angio-edema    Anxiety    Back pain    BRCA negative 09/2020   MyRisk neg except AXIN2 VUS   Family history of adverse reaction to anesthesia    heart condition   Family history of breast cancer 09/2020   IBIS=15.7%/riskscore=14.7%   Family history of pancreatic cancer    Family history of thyroid  disease    GERD (gastroesophageal reflux disease)    Lactose intolerance    Migraine    Multiple food allergies    Urticaria     Past Surgical History:  Procedure Laterality Date   NO PAST SURGERIES     TONSILLECTOMY Bilateral 09/05/2022   Procedure: TONSILLECTOMY;  Surgeon: Blair Mt, MD;  [facility];  Service: ENT;  Laterality: Bilateral;   TONSILLECTOMY     WISDOM TOOTH EXTRACTION      Family Psychiatric History: I have had polyarticular reviewed family psychiatric history from progress note on 02/02/2022.  Family History:  Family History  Problem Relation Age of Onset   Migraines Mother    Depression Mother    Anxiety disorder Mother    Allergic rhinitis Father    Skin cancer Father    Allergic rhinitis Brother    Allergies Brother    Asthma Maternal Grandmother    Hypertension Maternal Grandmother    Thyroid  disease Maternal  Grandmother    Hypertension Maternal Grandfather    Pancreatic cancer Maternal Grandfather 24   Colon cancer Maternal Grandfather 60   Hypertension Paternal Grandmother    Diabetes Paternal Grandmother    Hypertension Paternal Grandfather    Diabetes Paternal Grandfather    Colon cancer Paternal Grandfather 30   Thyroid  disease Maternal Aunt    Breast cancer Maternal Aunt 38    Social History: I have reviewed social history from progress note on 02/02/2022. Social History   Socioeconomic History   Marital status: Married    Spouse name: Maylon [place]   Number of children: 2   Years of education: Not on file   Highest education level: Associate degree: academic program  Occupational History   Occupation: Massage Therapy  Tobacco Use   Smoking status: Never    Passive exposure: Never   Smokeless tobacco: Never  Vaping Use   Vaping status: Never Used  Substance and Sexual Activity   Alcohol use: Never   Drug use: No   Sexual activity: Yes    Partners: Male    Birth control/protection: None  Other Topics Concern   Not on file  Social History Narrative   ** Merged History Encounter **       One story home Right-handed Caffeine: occasional coffee   Social Drivers of Corporate investment banker Strain: Medium Risk (01/10/2024)   Overall Financial Resource Strain (CARDIA)    Difficulty of Paying Living Expenses: Somewhat hard  Food Insecurity: Food Insecurity Present (01/10/2024)   Hunger Vital Sign    Worried About Running Out of Food in the Last Year: Sometimes true    Ran Out of Food in the Last Year: Never true  Transportation Needs: No Transportation Needs (01/10/2024)   PRAPARE - Administrator, Civil Service (Medical): No    Lack of Transportation (Non-Medical): No  Physical Activity: Insufficiently Active (01/10/2024)   Exercise Vital Sign    Days of Exercise per Week: 4 days    Minutes of Exercise per Session: 30 min  Stress: Stress Concern Present  (01/10/2024)   [employer] of Occupational Health - Occupational Stress Questionnaire    Feeling of Stress : To some extent  Social Connections: Unknown (01/10/2024)   Social Connection and Isolation Panel    Frequency of Communication with Friends and Family: Three times a week    Frequency of Social Gatherings with Friends and Family: Never    Attends Religious Services: Never    Database administrator or Organizations: No    Attends Engineer, structural: Not on file    Marital Status: Patient declined    Allergies:  Allergies  Allergen Reactions   Doxycycline  Other (See Comments)    Exacerbates Migraines   Imitrex  [Sumatriptan ] Swelling    Facial swelling and burning lips   Molds & Smuts  Pollen Extract-Tree Extract [Pollen Extract]    Shrimp Extract    Tylenol  [Acetaminophen ] Other (See Comments)    Causes rebound headaches    Penicillins Hives    Did it involve swelling of the face/tongue/throat, SOB, or low BP? No Did it involve sudden or severe rash/hives, skin peeling, or any reaction on the inside of your mouth or nose? Yes Did you need to seek medical attention at a hospital or doctor's office? Yes When did it last happen?      childhood allergy  If all above answers are "NO", may proceed with cephalosporin use.    Metabolic Disorder Labs: Lab Results  Component Value Date   HGBA1C 5.2 05/05/2024   No results found for: PROLACTIN Lab Results  Component Value Date   CHOL 127 05/05/2024   TRIG 62 05/05/2024   HDL 59 05/05/2024   CHOLHDL 2.2 05/05/2024   LDLCALC 55 05/05/2024   LDLCALC 69 06/28/2020   Lab Results  Component Value Date   TSH 1.540 05/05/2024   TSH 1.10 03/05/2024    Therapeutic Level Labs: No results found for: LITHIUM No results found for: VALPROATE No results found for: CBMZ  Current Medications: Current Outpatient Medications  Medication Sig Dispense Refill   busPIRone  (BUSPAR ) 10 MG tablet Take 2 tablets  (20 mg total) by mouth 2 (two) times daily. 360 tablet 1   Clindamycin -Benzoyl Per, Refr, (DUAC) gel Apply to face qam, wash off qhs (Patient not taking: Reported on 06/25/2024) 45 g 3   escitalopram  (LEXAPRO ) 20 MG tablet Take 1 tablet (20 mg total) by mouth daily. Dose increase 90 tablet 1   hydrOXYzine  (VISTARIL ) 25 MG capsule TAKE 1-2 CAPSULES (25-50 MG TOTAL) BY MOUTH AT BEDTIME AS NEEDED. FOR ANXIETY AND SLEEP (Patient not taking: Reported on 06/25/2024) 180 capsule 0   levocetirizine (XYZAL ) 5 MG tablet Take 1 tablet (5 mg total) by mouth in the morning and at bedtime. 60 tablet 5   predniSONE  (STERAPRED UNI-PAK 21 TAB) 10 MG (21) TBPK tablet Take by mouth daily. Take 6 tabs by mouth daily  for 1 days, then 5 tabs for 1 days, then 4 tabs for 1 days, then 3 tabs for 1 days, 2 tabs for 1 days, then 1 tab by mouth daily for 1 days (Patient not taking: Reported on 06/25/2024) 21 tablet 0   topiramate  (TOPAMAX ) 25 MG tablet TAKE 3 TABLETS BY MOUTH AT BEDTIME. 270 tablet 1   traZODone  (DESYREL ) 50 MG tablet TAKE 1/2 TABLET BY MOUTH AT BEDTIME AS NEEDED FOR SLEEP 45 tablet 0   tretinoin  (RETIN-A ) 0.05 % cream Apply topically at bedtime. 2-3 times a week and then gradually increase frequency 45 g 0   tretinoin  (RETIN-A ) 0.1 % cream Apply topically at bedtime. 45 g 3   valACYclovir  (VALTREX ) 1000 MG tablet Take 1 tablet (1,000 mg total) by mouth daily. 90 tablet 2   Vitamin D , Ergocalciferol , (DRISDOL ) 1.25 MG (50000 UNIT) CAPS capsule Take 1 capsule (50,000 Units total) by mouth every 7 (seven) days. 4 capsule 0   Zavegepant HCl (ZAVZPRET ) 10 MG/ACT SOLN Place 1 spray into the nose daily as needed. Maximum 1 spray in 24 hours. 8 each 5   No current facility-administered medications for this visit.     Musculoskeletal: Strength & Muscle Tone: within normal limits Gait & Station: normal Patient leans: N/A  Psychiatric Specialty Exam: Review of Systems  Psychiatric/Behavioral:  Positive for sleep  disturbance. The patient is nervous/anxious.  Blood pressure 101/68, pulse 87, temperature 97.7 F (36.5 C), temperature source Temporal, height 5' 3 (1.6 m), weight 181 lb (82.1 kg).Body mass index is 32.06 kg/m.  General Appearance: Casual  Eye Contact:  Fair  Speech:  Normal Rate  Volume:  Normal  Mood:  Anxious improving  Affect:  Congruent  Thought Process:  Goal Directed and Descriptions of Associations: Intact  Orientation:  Full (Time, Place, and Person)  Thought Content: Logical   Suicidal Thoughts:  No  Homicidal Thoughts:  No  Memory:  Immediate;   Fair Recent;   Fair Remote;   Fair  Judgement:  Fair  Insight:  Fair  Psychomotor Activity:  Normal  Concentration:  Concentration: Fair and Attention Span: Fair  Recall:  [employer] of Knowledge: Fair  Language: Fair  Akathisia:  No  Handed:  Right  AIMS (if indicated): not done  Assets:  Communication Skills Desire for Improvement Social Support Talents/Skills Transportation  ADL's:  Intact  Cognition: WNL  Sleep:  improving   Screenings: AIMS    Flowsheet Row Video Visit from 07/21/2022 in [facility]  AIMS Total Score 0   GAD-7    Flowsheet Row Office Visit from 07/07/2024 in [facility] Counselor from 03/11/2024 in [facility] Counselor from 11/06/2023 in [facility] Office Visit from 10/08/2023 in [facility] Office Visit from 07/02/2023 in [facility]  Total GAD-7 Score 5 2 16 13 12    PHQ2-9    Flowsheet Row Office Visit from 07/07/2024 in [facility] Office Visit from 05/05/2024 in [facility] from 03/11/2024 in [facility]  Counselor from 11/06/2023 in [facility] Office Visit from 10/08/2023 in [facility]  PHQ-2 Total Score 0 1 0 2 1  PHQ-9 Total Score -- 7 1 9 12    Flowsheet Row Office Visit from 07/07/2024 in [facility] from 04/21/2024 in [facility] from 03/11/2024 in [facility]  C-SSRS RISK CATEGORY No Risk No Risk No Risk     Assessment and Plan: Amanda [name] is a 28 year old female, married, lives in [place], has a history of depression, anxiety, trauma and stress related disorder, insomnia was evaluated in office today.  Discussed assessment and plan as noted below.  Generalized anxiety disorder-improving Currently back on Lexapro  and continues to be on BuSpar  as prescribed.  Denies any significant anxiety symptoms.  Motivated to stay in therapy Continue Lexapro  20 mg daily Continue BuSpar  20 mg twice daily  MDD in remission Currently denies any significant depression symptoms Continue Lexapro  20 mg daily Continue CBT with Ms. Perkins  Trauma and stress related disorder unspecified-rule out PTSD-improving Currently denies any flashbacks, intrusive memories or nightmares Continue Lexapro  as prescribed Continue CBT  Insomnia-improving Currently reports sleep is improved with use of sleep hygiene and trazodone . Continue Trazodone  25 mg at bedtime Continue Hydroxyzine  25 to 50 mg at bedtime as needed Continue sleep hygiene techniques.  Follow-up Follow-up in clinic in 3 months or sooner if needed.    Collaboration of Care: Collaboration of Care: Referral or follow-up with counselor/therapist [facility] to continue CBT, has upcoming appointment.  Patient/Guardian was  advised Release of Information must be obtained prior to any record release in order to collaborate their care with an  outside provider. Patient/Guardian was advised if they have not already done so to contact the registration department to sign all necessary forms in order for us  to release information regarding their care.   Consent: Patient/Guardian gives verbal consent for treatment and assignment of benefits for services provided during this visit. Patient/Guardian expressed understanding and agreed to proceed.   This note was generated in part or whole with voice recognition software. Voice recognition is usually quite accurate but there are transcription errors that can and very often do occur. I apologize for any typographical errors that were not detected and corrected.    Amanda Dechellis, MD 07/07/2024, 1:59 PM

## 2024-07-08 ENCOUNTER — Telehealth (INDEPENDENT_AMBULATORY_CARE_PROVIDER_SITE_OTHER): Admitting: Psychology

## 2024-07-08 ENCOUNTER — Ambulatory Visit: Admitting: Family Medicine

## 2024-07-08 NOTE — Progress Notes (Signed)
 Amanda [name], D.O.  ABFM, ABOM Specializing in Clinical Bariatric Medicine  Office located at: [address] [place], [place]  [address]   Assessment and Plan:   Medications Discontinued During This Encounter  Medication Reason   Vitamin D , Ergocalciferol , (DRISDOL ) 1.25 MG (50000 UNIT) CAPS capsule Reorder    Meds ordered this encounter  Medications   Vitamin D , Ergocalciferol , (DRISDOL ) 1.25 MG (50000 UNIT) CAPS capsule    Sig: Take 1 capsule (50,000 Units total) by mouth every 7 (seven) days.    Dispense:  4 capsule    Refill:  0     FOR THE DISEASE OF OBESITY: Starting BMI 29.0-29.9,adult 29.98 BMI 31.0-31.9,adult current 31.18 Assessment & Plan: Since last office visit on 06/10/24 patient's muscle mass has increased by 1.4 lbs. Fat mass has decreased by 1.6 lbs. Total body water has decreased by 2.8 lbs.  Counseling done on how various foods will affect these numbers and how to maximize success  Total lbs lost to date:  +8 lbs Total weight loss percentage to date:  +4.76%   Recommended Dietary Goals Devaeh is currently in the action stage of change. As such, her goal is to continue weight management plan.  She has agreed to: continue current plan   Behavioral Intervention We discussed the following today: increasing lean protein intake to established goals, decreasing simple carbohydrates , avoiding skipping meals, increasing water intake , work on tracking and journaling calories using tracking application, decreasing eating out or consumption of processed foods, and making healthy choices when eating convenient foods, continue to work on implementation of reduced calorie nutritional plan, and focusing on food with a 10:1 ratio of calories: grams of protein  Additional resources provided today: Handout on complex carbohydrates and lean sources of protein and Handout on Daily Food Journaling Log  Evidence-based interventions for health behavior change were  utilized today including the discussion of self monitoring techniques, problem-solving barriers and SMART goal setting techniques.   Regarding patient's less desirable eating habits and patterns, we employed the technique of small changes.   Pt will specifically work on: Journal her daily intake on the Journaling paper.    Recommended Physical Activity Goals Genene has been advised to work up to 300-450 minutes of moderate intensity aerobic activity a week and strengthening exercises 2-3 times per week for cardiovascular health, weight loss maintenance and preservation of muscle mass.   She has agreed to: Continue current level of physical activity  and continue current med regimen.   Pharmacotherapy We both agreed to: continue current regimen.    ASSOCIATED CONDITIONS ADDRESSED TODAY:  Insulin  resistance Assessment & Plan: Lab Results  Component Value Date   HGBA1C 5.2 05/05/2024   HGBA1C 5.3 01/07/2015   INSULIN  5.8 05/05/2024    No meds currently; diet/lifestyle approach. Pt is asx. No acute concerns in this regard. Continue with current medications . Reminded pt of 10:1 of calories:grams of protein. Avoid simple carbs/sugars and increase daily protein intake. Counseled pt on several healthier alternatives to eat, such as bagels. Will continue monitoring.     Vitamin D  deficiency Assessment & Plan: Lab Results  Component Value Date   VD25OH 33.6 05/05/2024   VD25OH 27.3 (L) 06/28/2020   Currently on ERGO 50K units once weekly. Good compliance and tolerance. Ideal vit D levels reviewed w pt. Continue current supplementation as prescribed; refill ERGO today. Will recheck levels periodically.     Mood disorder (HCC) - emotional eating Assessment & Plan: Has  followed up with Dr. Sharron. Currently on Buspar  10 mg BID and Lexapro  20 mg once daily with good compliance and tolerance. Endorses emotional eating. Packs a to-go box to avoid over-eating at restaurants. Continue  working w Dr. Sharron with emotional eating strategies. Encouraged pt to journal daily intake to increase mindful eating. Avoid simple/carbs sugars via her meal plan. Continue current medication regimen as prescribed.     Intractable chronic migraine without aura with status migrainosus Assessment & Plan: Compliant with Topiramate  75 at nighttime. Tolerating well with no adverse side effects. Stopped taking Topiramate  for the past couple of months. Has not had frequent migraines since seeing the chiropractor. Pt established and followed by Dr. Skeet from neuro. Reviewed how Topiramate  can help with hunger/cravings. We recommend if she didn't have any side effects to get back on Topamax  to hep with hunger/cravings.    Follow up:   Return in about 4 weeks (around 07/23/2024). She was informed of the importance of frequent follow up visits to maximize her success with intensive lifestyle modifications for her multiple health conditions.  Subjective:   Chief complaint: Obesity Huyen is here to discuss her progress with her obesity treatment plan. She is on keeping a food journal and adhering to recommended goals of 1450-1550 calories and 100+ g of protein using CAT 2 MP as guide and states she is following her eating plan approximately 90% of the time. She states she is 30-60 minutes 5 days per week.  Interval History:  [name] is here for a follow up office visit. Since last OV on 06/10/24, she has maintained her weight. When going out to eat, she has struggled to find something to eat. She recalls going out to eat [place] food and ordered extra grilled chicken with a small portion of rice and a salad. Has been eating out less, eating less salt, and is drinking more water. Since making lifestyle changes, she has noticed she does not enjoy foods she used to. She previously enjoyed eating [employer] barbeque sandwiches, but she recently was not able to finish her sandwich and instead ate an egg and  cheese on a bagel. Has been journaling using an app.   Pharmacotherapy that aid with weight loss: She is currently taking Topiramate  75 once nightly.   Review of Systems:  Pertinent positives were addressed with patient today.  Reviewed by clinician on day of visit: allergies, medications, problem list, medical history, surgical history, family history, social history, and previous encounter notes.  Weight Summary and Biometrics   Weight Lost Since Last Visit: 0lb Weight Gained Since Last Visit: 0lb   Vitals Temp: 97.7 F (36.5 C) BP: 101/68 Pulse Rate: 87   Anthropometric Measurements Height: 5' 3 (1.6 m) Weight: 181 lb (82.1 kg) BMI (Calculated): 32.07   Other Clinical Data RMR: 2016 Fasting: No Labs: no Today's Visit #: 4 Starting Date: 05/05/24 Comments: fourth visit  Objective:   PHYSICAL EXAM: Blood pressure 107/72, pulse 77, temperature 98.5 F (36.9 C), height 5' 3 (1.6 m), weight 176 lb (79.8 kg), SpO2 99%. Body mass index is 31.18 kg/m.  General: she is overweight, cooperative and in no acute distress. PSYCH: Has normal mood, affect and thought process.   HEENT: EOMI, sclerae are anicteric. Lungs: Normal breathing effort, no conversational dyspnea. Extremities: Moves * 4 Neurologic: A and O * 3, good insight  DIAGNOSTIC DATA REVIEWED: BMET    Component Value Date/Time   NA 139 05/05/2024 1013   K 4.3 05/05/2024 1013  CL 101 05/05/2024 1013   CO2 22 05/05/2024 1013   GLUCOSE 90 05/05/2024 1013   GLUCOSE 82 03/14/2024 1626   BUN 12 05/05/2024 1013   CREATININE 0.74 05/05/2024 1013   CREATININE 0.79 03/14/2024 1626   CALCIUM 9.7 05/05/2024 1013   GFRNONAA >60 08/18/2023 1431   GFRAA 131 06/28/2020 0930   Lab Results  Component Value Date   HGBA1C 5.2 05/05/2024   HGBA1C 5.3 01/07/2015   Lab Results  Component Value Date   INSULIN  5.8 05/05/2024   Lab Results  Component Value Date   TSH 1.540 05/05/2024   CBC    Component  Value Date/Time   WBC 11.8 (H) 05/05/2024 1013   WBC 10.2 08/18/2023 1431   RBC 4.92 05/05/2024 1013   RBC 4.15 08/18/2023 1431   HGB 14.4 05/05/2024 1013   HCT 44.1 05/05/2024 1013   PLT 249 05/05/2024 1013   MCV 90 05/05/2024 1013   MCH 29.3 05/05/2024 1013   MCH 29.9 08/18/2023 1431   MCHC 32.7 05/05/2024 1013   MCHC 33.9 08/18/2023 1431   RDW 11.9 05/05/2024 1013   Iron Studies No results found for: IRON, TIBC, FERRITIN, IRONPCTSAT Lipid Panel     Component Value Date/Time   CHOL 127 05/05/2024 1013   TRIG 62 05/05/2024 1013   HDL 59 05/05/2024 1013   CHOLHDL 2.2 05/05/2024 1013   LDLCALC 55 05/05/2024 1013   Hepatic Function Panel     Component Value Date/Time   PROT 7.6 05/05/2024 1013   ALBUMIN 4.8 05/05/2024 1013   AST 17 05/05/2024 1013   ALT 6 05/05/2024 1013   ALKPHOS 79 05/05/2024 1013   BILITOT 0.4 05/05/2024 1013   BILIDIR 0.2 08/18/2023 1431   IBILI 0.8 08/18/2023 1431      Component Value Date/Time   TSH 1.540 05/05/2024 1013   Nutritional Lab Results  Component Value Date   VD25OH 33.6 05/05/2024   VD25OH 27.3 (L) 06/28/2020    Attestations:   LILLETTE Vernell Forest, acting as a medical scribe for Amanda Jenkins, DO., have compiled all relevant documentation for today's office visit on behalf of Amanda Jenkins, DO, while in the presence of [employer], DO.  Reviewed by clinician on day of visit: allergies, medications, problem list, medical history, surgical history, family history, social history, and previous encounter notes pertinent to patient's obesity diagnosis.  I have reviewed the above documentation for accuracy and completeness, and I agree with the above. Amanda JINNY [name], D.O.  The 21st Century Cures Act was signed into law in 2016 which includes the topic of electronic health records.  This provides immediate access to information in MyChart.  This includes consultation notes, operative notes, office notes, lab results  and pathology reports.  If you have any questions about what you read please let us  know at your next visit so we can discuss your concerns and take corrective action if need be.  We are right here with you.

## 2024-07-08 NOTE — Progress Notes (Incomplete)
 Amanda [name], D.O.  ABFM, ABOM Specializing in Clinical Bariatric Medicine  Office located at: [address] [place], [place]  [address]   Assessment and Plan:   Medications Discontinued During This Encounter  Medication Reason  . Vitamin D , Ergocalciferol , (DRISDOL ) 1.25 MG (50000 UNIT) CAPS capsule Reorder    Meds ordered this encounter  Medications  . Vitamin D , Ergocalciferol , (DRISDOL ) 1.25 MG (50000 UNIT) CAPS capsule    Sig: Take 1 capsule (50,000 Units total) by mouth every 7 (seven) days.    Dispense:  4 capsule    Refill:  0     FOR THE DISEASE OF OBESITY: Starting BMI 29.0-29.9,adult 29.98 BMI 31.0-31.9,adult current 31.18 Assessment & Plan: Since last office visit on *** patient's muscle mass has {DID:29233} by ***lbs. Fat mass has {DID:29233} by ***lbs. Total body water has {DID:29233} by ***lbs.  Counseling done on how various foods will affect these numbers and how to maximize success  Total lbs lost to date: *** lbs Total weight loss percentage to date: *** %   Recommended Dietary Goals [name] is currently in the action stage of change. As such, her goal is to continue weight management plan.  She has agreed to: {EMWTLOSSPLAN:29297::continue current plan}   Behavioral Intervention We discussed the following today: {dowtlossstrategies:31654}  Additional resources provided today: {DOhandouts:31655::None}  Evidence-based interventions for health behavior change were utilized today including the discussion of self monitoring techniques, problem-solving barriers and SMART goal setting techniques.   Regarding patient's less desirable eating habits and patterns, we employed the technique of small changes.   Pt will specifically work on: ***    Recommended Physical Activity Goals Zariel has been advised to work up to 300-450 minutes of moderate intensity aerobic activity a week and strengthening exercises 2-3 times per week for cardiovascular  health, weight loss maintenance and preservation of muscle mass.   She has agreed to: {EMEXERCISE:28847::Think about enjoyable ways to increase daily physical activity and overcoming barriers to exercise,Increase physical activity in their day and reduce sedentary time (increase NEAT).}   Pharmacotherapy We both agreed to: {EMagreedrx:29170}   ASSOCIATED CONDITIONS ADDRESSED TODAY:  Insulin  resistance  Vitamin D  deficiency  Mood disorder (HCC) - emotional eating  Intractable chronic migraine without aura with status migrainosus  Starting BMI 29.0-29.9,adult 29.98  BMI 31.0-31.9,adult current 31.18  Other orders -     Vitamin D  (Ergocalciferol ); Take 1 capsule (50,000 Units total) by mouth every 7 (seven) days.  Dispense: 4 capsule; Refill: 0       Follow up:   Return in about 4 weeks (around 07/23/2024). She was informed of the importance of frequent follow up visits to maximize her success with intensive lifestyle modifications for her multiple health conditions.  Subjective:   Chief complaint: Obesity Jolie is here to discuss her progress with her obesity treatment plan. She is on keeping a food journal and adhering to recommended goals of 1450-1550 calories and 100+ g of protein using CAT 2 MP as guide and states she is following her eating plan approximately 90% of the time. She states she is 30-60 minutes 5 days per week.  Interval History:  Amanda [name] is here for a follow up office visit. Since last OV on 06/10/24, she has maintained her weight.      Has been journaling using an app.  When going out to eat, she has struggled to find something to eat. She recalls going out to eat [place] food and ordered extra grilled  chicken with a small portion of rice and a salad.    Has been eating out less, eating less slat, and is drinking more water.     Pharmacotherapy that aid with weight loss: She is currently taking {EMPharmaco:28845}.   Review of Systems:   Pertinent positives were addressed with patient today.  Reviewed by clinician on day of visit: allergies, medications, problem list, medical history, surgical history, family history, social history, and previous encounter notes.  Weight Summary and Biometrics   Weight Lost Since Last Visit: 0lb Weight Gained Since Last Visit: 0lb   Vitals Temp: 97.7 F (36.5 C) BP: 101/68 Pulse Rate: 87   Anthropometric Measurements Height: 5' 3 (1.6 m) Weight: 181 lb (82.1 kg) BMI (Calculated): 32.07   Other Clinical Data RMR: 2016 Fasting: No Labs: no Today's Visit #: 4 Starting Date: 05/05/24 Comments: fourth visit  Objective:   PHYSICAL EXAM: Blood pressure 107/72, pulse 77, temperature 98.5 F (36.9 C), height 5' 3 (1.6 m), weight 176 lb (79.8 kg), SpO2 99%. Body mass index is 31.18 kg/m.  General: she is overweight, cooperative and in no acute distress. PSYCH: Has normal mood, affect and thought process.   HEENT: EOMI, sclerae are anicteric. Lungs: Normal breathing effort, no conversational dyspnea. Extremities: Moves * 4 Neurologic: A and O * 3, good insight  DIAGNOSTIC DATA REVIEWED: BMET    Component Value Date/Time   NA 139 05/05/2024 1013   K 4.3 05/05/2024 1013   CL 101 05/05/2024 1013   CO2 22 05/05/2024 1013   GLUCOSE 90 05/05/2024 1013   GLUCOSE 82 03/14/2024 1626   BUN 12 05/05/2024 1013   CREATININE 0.74 05/05/2024 1013   CREATININE 0.79 03/14/2024 1626   CALCIUM 9.7 05/05/2024 1013   GFRNONAA >60 08/18/2023 1431   GFRAA 131 06/28/2020 0930   Lab Results  Component Value Date   HGBA1C 5.2 05/05/2024   HGBA1C 5.3 01/07/2015   Lab Results  Component Value Date   INSULIN  5.8 05/05/2024   Lab Results  Component Value Date   TSH 1.540 05/05/2024   CBC    Component Value Date/Time   WBC 11.8 (H) 05/05/2024 1013   WBC 10.2 08/18/2023 1431   RBC 4.92 05/05/2024 1013   RBC 4.15 08/18/2023 1431   HGB 14.4 05/05/2024 1013   HCT 44.1  05/05/2024 1013   PLT 249 05/05/2024 1013   MCV 90 05/05/2024 1013   MCH 29.3 05/05/2024 1013   MCH 29.9 08/18/2023 1431   MCHC 32.7 05/05/2024 1013   MCHC 33.9 08/18/2023 1431   RDW 11.9 05/05/2024 1013   Iron Studies No results found for: IRON, TIBC, FERRITIN, IRONPCTSAT Lipid Panel     Component Value Date/Time   CHOL 127 05/05/2024 1013   TRIG 62 05/05/2024 1013   HDL 59 05/05/2024 1013   CHOLHDL 2.2 05/05/2024 1013   LDLCALC 55 05/05/2024 1013   Hepatic Function Panel     Component Value Date/Time   PROT 7.6 05/05/2024 1013   ALBUMIN 4.8 05/05/2024 1013   AST 17 05/05/2024 1013   ALT 6 05/05/2024 1013   ALKPHOS 79 05/05/2024 1013   BILITOT 0.4 05/05/2024 1013   BILIDIR 0.2 08/18/2023 1431   IBILI 0.8 08/18/2023 1431      Component Value Date/Time   TSH 1.540 05/05/2024 1013   Nutritional Lab Results  Component Value Date   VD25OH 33.6 05/05/2024   VD25OH 27.3 (L) 06/28/2020    Attestations:   LILLETTE Vernell Forest, acting as  a medical scribe for Amanda Jenkins, DO., have compiled all relevant documentation for today's office visit on behalf of Amanda Jenkins, DO, while in the presence of [employer], DO.  Reviewed by clinician on day of visit: allergies, medications, problem list, medical history, surgical history, family history, social history, and previous encounter notes pertinent to patient's obesity diagnosis.  I have reviewed the above documentation for accuracy and completeness, and I agree with the above. Amanda JINNY [name], D.O.  The 21st Century Cures Act was signed into law in 2016 which includes the topic of electronic health records.  This provides immediate access to information in MyChart.  This includes consultation notes, operative notes, office notes, lab results and pathology reports.  If you have any questions about what you read please let us  know at your next visit so we can discuss your concerns and take corrective action if need  be.  We are right here with you.

## 2024-07-09 ENCOUNTER — Ambulatory Visit (INDEPENDENT_AMBULATORY_CARE_PROVIDER_SITE_OTHER): Admitting: Family Medicine

## 2024-07-09 ENCOUNTER — Encounter (INDEPENDENT_AMBULATORY_CARE_PROVIDER_SITE_OTHER): Payer: Self-pay | Admitting: Family Medicine

## 2024-07-09 VITALS — BP 100/68 | HR 99 | Temp 98.7°F | Ht 63.0 in | Wt 174.0 lb

## 2024-07-09 DIAGNOSIS — E88819 Insulin resistance, unspecified: Secondary | ICD-10-CM

## 2024-07-09 DIAGNOSIS — F5089 Other specified eating disorder: Secondary | ICD-10-CM

## 2024-07-09 DIAGNOSIS — E559 Vitamin D deficiency, unspecified: Secondary | ICD-10-CM | POA: Diagnosis not present

## 2024-07-09 DIAGNOSIS — Z6829 Body mass index (BMI) 29.0-29.9, adult: Secondary | ICD-10-CM

## 2024-07-09 DIAGNOSIS — Z6830 Body mass index (BMI) 30.0-30.9, adult: Secondary | ICD-10-CM

## 2024-07-09 DIAGNOSIS — F39 Unspecified mood [affective] disorder: Secondary | ICD-10-CM | POA: Diagnosis not present

## 2024-07-09 DIAGNOSIS — G43711 Chronic migraine without aura, intractable, with status migrainosus: Secondary | ICD-10-CM

## 2024-07-09 MED ORDER — VITAMIN D (ERGOCALCIFEROL) 1.25 MG (50000 UNIT) PO CAPS: 50000.0000 [IU] | ORAL_CAPSULE | ORAL | 0 refills | Status: DC

## 2024-07-09 NOTE — Progress Notes (Signed)
 Amanda [name], D.O.  ABFM, ABOM Specializing in Clinical Bariatric Medicine  Office located at: [address] [place], [place]  [address]   Assessment and Plan:   Medications Discontinued During This Encounter  Medication Reason   Vitamin D , Ergocalciferol , (DRISDOL ) 1.25 MG (50000 UNIT) CAPS capsule Reorder    Meds ordered this encounter  Medications   Vitamin D , Ergocalciferol , (DRISDOL ) 1.25 MG (50000 UNIT) CAPS capsule    Sig: Take 1 capsule (50,000 Units total) by mouth every 7 (seven) days.    Dispense:  4 capsule    Refill:  0     FOR THE DISEASE OF OBESITY: Starting BMI 29.0-29.9,adult 29.98 BMI 30.0-30.9,adult -- Curent BMI 30.82 Assessment & Plan: Since last office visit on 06/25/24 patient's muscle mass has increased by 2 lbs. Fat mass has decreased by 4.4 lbs. Total body water has decreased by 2 lbs.  Counseling done on how various foods will affect these numbers and how to maximize success  Total lbs lost to date: + 6 lbs Total weight loss percentage to date: +3.57 %   Recommended Dietary Goals Amanda [name] is currently in the action stage of change. As such, her goal is to continue weight management plan.  She has agreed to: Switch to Cat 3 MP with lunch options and without snack calories and continue journaling    Behavioral Intervention We discussed the following today: increasing lean protein intake to established goals, decreasing simple carbohydrates , increasing water intake , continue to practice mindfulness when eating, and continue to work on maintaining a reduced calorie state, getting the recommended amount of protein, incorporating whole foods, making healthy choices, staying well hydrated and practicing mindfulness when eating., avoid starchy veggies.   Additional resources provided today: Handout on complex carbohydrates and lean sources of protein, Handout on CAT 3 meal plan, and Handout on CAT 3-4 lunch options  Evidence-based interventions  for health behavior change were utilized today including the discussion of self monitoring techniques, problem-solving barriers and SMART goal setting techniques.   Regarding patient's less desirable eating habits and patterns, we employed the technique of small changes.   Pt will specifically work on: journaling daily calorie/protein intake and bring in her log to her next OV    Recommended Physical Activity Goals Amanda [name] has been advised to work up to 300-450 minutes of moderate intensity aerobic activity a week and strengthening exercises 2-3 times per week for cardiovascular health, weight loss maintenance and preservation of muscle mass.   She has agreed to: Continue current level of physical activity    Pharmacotherapy We both agreed to: Continue with current nutritional and behavioral strategies and continue Topamax  for migraines and hunger/cravings.   ASSOCIATED CONDITIONS ADDRESSED TODAY:  Insulin  resistance Assessment & Plan: Lab Results  Component Value Date   HGBA1C 5.2 05/05/2024   HGBA1C 5.3 01/07/2015   INSULIN  5.8 05/05/2024    No meds currently. Diet and lifestyle controlled. Pt is asx. Hunger/cravings well controlled.  Continue best efforts with following her nutritional meal plan, exercising regularly, practicing mindful eating, and stress management. Advised pt to increase her protein intake at each meal and aim for a minimum of 8 ounces at dinner. Reviewed how adequate protein intake will support stabilization of sugars and insulin  sensitivity. Will continue monitoring alongside PCP.     Vitamin D  deficiency Assessment & Plan: Lab Results  Component Value Date   VD25OH 33.6 05/05/2024   VD25OH 27.3 (L) 06/28/2020   Has not started her  vitamin D  supplementation, stating the rx was only made ready for pick up after she left for vacation. Last labs showed VD below goal at 33.6.   Ideal vit D levels of 50-70 reviewed w pt. Start ERGO 50K units that was rx'ed at  [facility]. Reminded pt of risks/benefits of vit D supplementation. Discussed option of purchasing OTC vit D on [employer] at 50K units dose if she ever has issues getting her rx from the pharmacy. Will plan to recheck vit D around 09/28/24; about 2-3 months from when she will start vit D supps.     Mood disorder (HCC) - emotional eating Assessment & Plan: Follows up with Dr. Sharron. Moods stable and well controlled. Good compliance/tolerance w Buspar  20 mg BID and Lexapro  20 mg daily. Hunger and cravings are well controlled.   Continue current med regimen. Continue practicing mindful eating as she follows her meal plan and journals daily intake. F/u w Dr. Sharron as instructed by them.     Intractable chronic migraine without aura with status migrainosus Assessment & Plan: Migraines well controlled with Topamax  75 mg once nightly. Tolerating well with no SE. Hunger/cravings are overall well controlled.   Continue with current med regimen. No changes made today. Will continue monitoring alongside PCP as it relates to her wt loss.     Follow up:   Return for 3-4 week f/u .  She was informed of the importance of frequent follow up visits to maximize her success with intensive lifestyle modifications for her multiple health conditions.  Subjective:   Chief complaint: Obesity Amanda [name] is here to discuss her progress with her obesity treatment plan. She is on keeping a food journal and adhering to recommended goals of 1450-1550 calories and 100+ g of protein using CAT 2 MP as guide and states she is following her eating plan approximately 90% of the time. She states she is doing cardio 30 minutes 5 days per week and strength training 30 minutes 5 days per week.   Interval History:  Amanda [name] is here for a follow up office visit. Since last OV on 7/9, she is down 2 lbs. Pt recently returned from her one week trip to [place]. She did not journal while in [place], but states she ate fresh foods that were  mostly on plan. Went to the gym a couple of times while on her trip and did plenty of walking and swimming every day. She made sure to stayed well hydrated while in [place].   Pharmacotherapy that aid with weight loss: She is currently taking Topamax  75 mg once nightly.   Review of Systems:  Pertinent positives were addressed with patient today.  Reviewed by clinician on day of visit: allergies, medications, problem list, medical history, surgical history, family history, social history, and previous encounter notes.  Weight Summary and Biometrics   Weight Lost Since Last Visit: 2lb  Weight Gained Since Last Visit: 0lb    Vitals Temp: 98.7 F (37.1 C) BP: 100/68 Pulse Rate: 99 SpO2: 93 %   Anthropometric Measurements Height: 5' 3 (1.6 m) Weight: 174 lb (78.9 kg) BMI (Calculated): 30.83 Weight at Last Visit: 176lb Weight Lost Since Last Visit: 2lb Weight Gained Since Last Visit: 0lb Starting Weight: 168lb Total Weight Loss (lbs): 0 lb (0 kg) Peak Weight: 180lb Waist Measurement : 31 inches   Body Composition  Body Fat %: 35.7 % Fat Mass (lbs): 62.2 lbs Muscle Mass (lbs): 106.2 lbs Total Body Water (lbs): 74.4 lbs Visceral Fat  Rating : 5   Other Clinical Data RMR: 2016 Fasting: No Labs: no Today's Visit #: 5 Starting Date: 05/05/24 Comments: Cat2/[id number]/100+    Objective:   PHYSICAL EXAM: Blood pressure 100/68, pulse 99, temperature 98.7 F (37.1 C), height 5' 3 (1.6 m), weight 174 lb (78.9 kg), SpO2 93%. Body mass index is 30.82 kg/m.  General: she is overweight, cooperative and in no acute distress. PSYCH: Has normal mood, affect and thought process.   HEENT: EOMI, sclerae are anicteric. Lungs: Normal breathing effort, no conversational dyspnea. Extremities: Moves * 4 Neurologic: A and O * 3, good insight  DIAGNOSTIC DATA REVIEWED: BMET    Component Value Date/Time   NA 139 05/05/2024 1013   K 4.3 05/05/2024 1013   CL 101 05/05/2024 1013    CO2 22 05/05/2024 1013   GLUCOSE 90 05/05/2024 1013   GLUCOSE 82 03/14/2024 1626   BUN 12 05/05/2024 1013   CREATININE 0.74 05/05/2024 1013   CREATININE 0.79 03/14/2024 1626   CALCIUM 9.7 05/05/2024 1013   GFRNONAA >60 08/18/2023 1431   GFRAA 131 06/28/2020 0930   Lab Results  Component Value Date   HGBA1C 5.2 05/05/2024   HGBA1C 5.3 01/07/2015   Lab Results  Component Value Date   INSULIN  5.8 05/05/2024   Lab Results  Component Value Date   TSH 1.540 05/05/2024   CBC    Component Value Date/Time   WBC 11.8 (H) 05/05/2024 1013   WBC 10.2 08/18/2023 1431   RBC 4.92 05/05/2024 1013   RBC 4.15 08/18/2023 1431   HGB 14.4 05/05/2024 1013   HCT 44.1 05/05/2024 1013   PLT 249 05/05/2024 1013   MCV 90 05/05/2024 1013   MCH 29.3 05/05/2024 1013   MCH 29.9 08/18/2023 1431   MCHC 32.7 05/05/2024 1013   MCHC 33.9 08/18/2023 1431   RDW 11.9 05/05/2024 1013   Iron Studies No results found for: IRON, TIBC, FERRITIN, IRONPCTSAT Lipid Panel     Component Value Date/Time   CHOL 127 05/05/2024 1013   TRIG 62 05/05/2024 1013   HDL 59 05/05/2024 1013   CHOLHDL 2.2 05/05/2024 1013   LDLCALC 55 05/05/2024 1013   Hepatic Function Panel     Component Value Date/Time   PROT 7.6 05/05/2024 1013   ALBUMIN 4.8 05/05/2024 1013   AST 17 05/05/2024 1013   ALT 6 05/05/2024 1013   ALKPHOS 79 05/05/2024 1013   BILITOT 0.4 05/05/2024 1013   BILIDIR 0.2 08/18/2023 1431   IBILI 0.8 08/18/2023 1431      Component Value Date/Time   TSH 1.540 05/05/2024 1013   Nutritional Lab Results  Component Value Date   VD25OH 33.6 05/05/2024   VD25OH 27.3 (L) 06/28/2020    Attestations:   LILLETTE Vernell Forest, acting as a medical scribe for Amanda Jenkins, DO., have compiled all relevant documentation for today's office visit on behalf of Amanda Jenkins, DO, while in the presence of [employer], DO.  I have reviewed the above documentation for accuracy and completeness, and I  agree with the above. Amanda JINNY [name], D.O.  The 21st Century Cures Act was signed into law in 2016 which includes the topic of electronic health records.  This provides immediate access to information in MyChart.  This includes consultation notes, operative notes, office notes, lab results and pathology reports.  If you have any questions about what you read please let us  know at your next visit so we can discuss your concerns and take corrective action if  need be.  We are right here with you.

## 2024-07-10 ENCOUNTER — Ambulatory Visit (INDEPENDENT_AMBULATORY_CARE_PROVIDER_SITE_OTHER): Admitting: Licensed Clinical Social Worker

## 2024-07-10 DIAGNOSIS — F411 Generalized anxiety disorder: Secondary | ICD-10-CM | POA: Diagnosis not present

## 2024-07-10 DIAGNOSIS — F439 Reaction to severe stress, unspecified: Secondary | ICD-10-CM

## 2024-07-10 DIAGNOSIS — G4701 Insomnia due to medical condition: Secondary | ICD-10-CM | POA: Diagnosis not present

## 2024-07-10 DIAGNOSIS — F3342 Major depressive disorder, recurrent, in full remission: Secondary | ICD-10-CM

## 2024-07-10 NOTE — Progress Notes (Signed)
 THERAPIST PROGRESS NOTE  Virtual Visit via Video Note  I connected with Amanda [name] on 07/10/24 at 4:01pm by a video enabled telemedicine application and verified that I am speaking with the correct person using two identifiers.  Location: Patient: Address on file  Provider: [facility]   I discussed the limitations of evaluation and management by telemedicine and the availability of in person appointments. The patient expressed understanding and agreed to proceed.   I discussed the assessment and treatment plan with the patient. The patient was provided an opportunity to ask questions and all were answered. The patient agreed with the plan and demonstrated an understanding of the instructions.   The patient was advised to call back or seek an in-person evaluation if the symptoms worsen or if the condition fails to improve as anticipated.  I provided 46 minutes of non-face-to-face time during this encounter.   Amanda KATHEE Husband, LCSW   Session Time: 4:01pm-4:47pm  Participation Level: Active  Behavioral Response: CasualAlertEuthymic  Type of Therapy: Individual Therapy  Treatment Goals addressed:  Active     Anxiety     LTG: Amanda [name] will score less than 5 on the Generalized Anxiety Disorder 7 Scale (GAD-7)  (Progressing)     Start:  11/06/23    Expected End:  04/05/24       Goal Note     03/11/24: 70% progress I feel like I'm getting there, but I still have my anxious moments. I am still trying to work with coping.          STG: Amanda [name] will practice problem solving skills 3 times per week for the next 4 weeks.  (Progressing)     Start:  11/06/23    Expected End:  04/05/24       Goal Note     03/11/24: 50% progress I still struggle because with the control part is there but it takes over to where I cannot problem solve.          Work with Amanda [name] to track symptoms, triggers, and/or skill use through a mood chart, diary card, or journal     Start:  11/06/23          Perform psychoeducation regarding anxiety disorders     Start:  11/06/23         Coping Skills      Start:  11/06/23       Will work with the pt using CBT/DBT techniques to help the pt verbalize an understanding of the cognitive, physiological, and behavioral components of anxiety and its treatment. This will be done by using worksheets, interactive activities, CBT/ABC thought logs, modeling, homework, role playing and journaling. Will work with pt to learn and implement coping skills that result in a reduction of anxiety and improve daily functioning per pt self report 3 out of 5 documented sessions.         BH CCP Acute or Chronic Trauma Reaction     LTG: Recall traumatic events without becoming overwhelmed with negative emotions (Progressing)     Start:  11/06/23    Expected End:  04/05/24       Goal Note     03/11/24:  I have and then sometimes and stuff happens to bring up anger. Certain things keep popping up in my head, but I don't cry about it but it sits in my head and I worry.          STG: Amanda [name] will practice emotion regulation skills 2  time(s) per week for the next 8 week(s) (Progressing)     Start:  11/06/23    Expected End:  04/05/24       Goal Note     03/11/24: 80% progress My moment are spaced out and I'm good at calming before I give a reaction.         LTG: Pt reports she would like to Work through all the trauma I experienced through the breakup and with the other person (Progressing)     Start:  11/06/23    Expected End:  04/05/24       Goal Note     03/11/24: 50% progress Some things have been addressed and worked on.          STG: Amanda [name] will identify internal and external stimuli that trigger PTSD symptoms (Progressing)     Start:  11/06/23    Expected End:  04/05/24       Goal Note     03/11/24: 30% progress I'm getting there but I'm still learning what they are. Identified she would like to continue to build coping skills  to address her triggers.          Provide Amanda [name] with education on trauma-oriented therapy     Start:  11/06/23         Educate Amanda [name] as to the origins of PTSD, common symptoms, and how it impacts those affected by it     Start:  11/06/23         Educate Amanda [name] on common reactions to a traumatic experience     Start:  11/06/23         Educate Amanda [name] that exposure to trauma may result in brain and hormonal changes that can lead to difficulties with memory, learning, emotional regulation, poor impulse control, or depression that can persist     Start:  11/06/23         Increase Amanda [name]'s confidence in coping with PTSD symptoms by assigning them to list at least two positive actions or small successes daily in a journal; process these success experiences     Start:  11/06/23         Encourage Amanda [name] to practice problem solving skills 2 times per week for the next 8 weeks     Start:  11/06/23         Amanda [name] Amanda [name] to identify triggers to feelings that are associated with the trauma by completing assigned exercises; process the material that is produced     Start:  11/06/23         Coping Skills      Start:  11/06/23       Will work with the pt using CBT/DBT techniques to help the pt verbalize an understanding of the cognitive, physiological, and behavioral components of trauma sxs and its treatment. This will be done by using worksheets, interactive activities, CBT/ABC thought logs, modeling, homework, role playing and journaling. Will work with pt to learn and implement coping skills that result in a reduction of trauma sxs and improve daily functioning per pt self report 3 out of 5 documented sessions.          ProgressTowards Goals: Progressing  Interventions: Supportive, Reframing, and Other: EMDR  Summary: Amanda [name] is a 28 y.o. female who presents with symptoms of trauma and anxiety. Patient identifies symptoms to include difficulty within interpersonal  relationships, uncontrollable worry, hypervigilance, negative self affect, and irritability. Pt was oriented times 5. Pt was  cooperative and engaged. Pt denies SI/HI/AVH.     Patient utilized her therapeutic space to process difficulties with change specifically reflecting on difficulties adapting to her new routine following a vacation. Reports she feel she fell into a small depressive episode. Reports she has experienced overwhelming feelings stating she made a small situation bigger than it needed to be. States she experience a panic attack due to difficulties communicating with her spouse. Attributes some of her emotional variability due to forgetting her medication on her vacation and going 6 days without her mental health medications.    Due to distractions on the video call the patient was not able to engage in EMDR.  Patient reflected on recent incident where she experienced difficulties communicating her feelings. Reports she feels this stems from feeling unheard by her father growing up. States when she feels unheard, she feels frustrated and thinks others are making her feel stupid.  Cln challenged the patient to acknowledge the differences she experiences communicating positively with her spouse on vacation and how she can model those behaviors at home.  Processed ways she copes with stress in a healthier way. Shares she feels her spouse does not cope with his stress in the healthiest ways. Processed ways she is establishing healthier boundaries within her social circles.   Suicidal/Homicidal: Nowithout intent/plan  Therapist Response: Clinician utilized active and supportive reflection to create a safe space for patient to process recent life events. Clinician assessed for current symptoms, stressors, and safety since last session.  Patient reflected on difficulties maintaining emotional regulation following recent vacation citing difficulties with medication regimen.  Reflected on ways  in which patient is reviewing use of healthy communication styles to maintain boundaries within her social circle specifically following triggering events related to her relationship with her father.  Plan: Return again in 2 weeks.  Diagnosis: GAD (generalized anxiety disorder)  MDD (major depressive disorder), recurrent, in full remission (HCC)  Insomnia due to medical condition  Trauma and stressor-related disorder   Collaboration of Care: AEB psychiatrist can access notes and cln. Will review psychiatrists' notes. Check in with the patient and will see LCSW per availability. Patient agreed with treatment recommendations.   Patient/Guardian was advised Release of Information must be obtained prior to any record release in order to collaborate their care with an outside provider. Patient/Guardian was advised if they have not already done so to contact the registration department to sign all necessary forms in order for us  to release information regarding their care.   Consent: Patient/Guardian gives verbal consent for treatment and assignment of benefits for services provided during this visit. Patient/Guardian expressed understanding and agreed to proceed.   Amanda KATHEE Husband, LCSW 07/10/2024

## 2024-07-22 ENCOUNTER — Telehealth (INDEPENDENT_AMBULATORY_CARE_PROVIDER_SITE_OTHER): Admitting: Psychology

## 2024-07-22 ENCOUNTER — Ambulatory Visit (INDEPENDENT_AMBULATORY_CARE_PROVIDER_SITE_OTHER): Admitting: Licensed Clinical Social Worker

## 2024-07-22 DIAGNOSIS — G4701 Insomnia due to medical condition: Secondary | ICD-10-CM | POA: Diagnosis not present

## 2024-07-22 DIAGNOSIS — F411 Generalized anxiety disorder: Secondary | ICD-10-CM

## 2024-07-22 DIAGNOSIS — F3342 Major depressive disorder, recurrent, in full remission: Secondary | ICD-10-CM

## 2024-07-22 DIAGNOSIS — F4310 Post-traumatic stress disorder, unspecified: Secondary | ICD-10-CM

## 2024-07-22 NOTE — Progress Notes (Signed)
 THERAPIST PROGRESS NOTE  Session Time: 2:03pm-2:51pm  Participation Level: Active  Behavioral Response: CasualAlertEuthymic  Type of Therapy: Individual Therapy  Treatment Goals addressed:  Active     Anxiety     LTG: Amanda [name] will score less than 5 on the Generalized Anxiety Disorder 7 Scale (GAD-7)  (Progressing)     Start:  11/06/23    Expected End:  04/05/24       Goal Note     03/11/24: 70% progress I feel like I'm getting there, but I still have my anxious moments. I am still trying to work with coping.          STG: Amanda [name] will practice problem solving skills 3 times per week for the next 4 weeks.  (Progressing)     Start:  11/06/23    Expected End:  04/05/24       Goal Note     03/11/24: 50% progress I still struggle because with the control part is there but it takes over to where I cannot problem solve.          Work with Amanda [name] to track symptoms, triggers, and/or skill use through a mood chart, diary card, or journal     Start:  11/06/23         Perform psychoeducation regarding anxiety disorders     Start:  11/06/23         Coping Skills      Start:  11/06/23       Will work with the pt using CBT/DBT techniques to help the pt verbalize an understanding of the cognitive, physiological, and behavioral components of anxiety and its treatment. This will be done by using worksheets, interactive activities, CBT/ABC thought logs, modeling, homework, role playing and journaling. Will work with pt to learn and implement coping skills that result in a reduction of anxiety and improve daily functioning per pt self report 3 out of 5 documented sessions.         BH CCP Acute or Chronic Trauma Reaction     LTG: Recall traumatic events without becoming overwhelmed with negative emotions (Progressing)     Start:  11/06/23    Expected End:  04/05/24       Goal Note     03/11/24:  I have and then sometimes and stuff happens to bring up anger. Certain  things keep popping up in my head, but I don't cry about it but it sits in my head and I worry.          STG: Amanda [name] will practice emotion regulation skills 2 time(s) per week for the next 8 week(s) (Progressing)     Start:  11/06/23    Expected End:  04/05/24       Goal Note     03/11/24: 80% progress My moment are spaced out and I'm good at calming before I give a reaction.         LTG: Pt reports she would like to Work through all the trauma I experienced through the breakup and with the other person (Progressing)     Start:  11/06/23    Expected End:  04/05/24       Goal Note     03/11/24: 50% progress Some things have been addressed and worked on.          STG: Amanda [name] will identify internal and external stimuli that trigger PTSD symptoms (Progressing)     Start:  11/06/23    Expected End:  04/05/24       Goal Note     03/11/24: 30% progress I'm getting there but I'm still learning what they are. Identified she would like to continue to build coping skills to address her triggers.          Provide Amanda [name] with education on trauma-oriented therapy     Start:  11/06/23         Educate Amanda [name] as to the origins of PTSD, common symptoms, and how it impacts those affected by it     Start:  11/06/23         Educate Amanda [name] on common reactions to a traumatic experience     Start:  11/06/23         Educate Amanda [name] that exposure to trauma may result in brain and hormonal changes that can lead to difficulties with memory, learning, emotional regulation, poor impulse control, or depression that can persist     Start:  11/06/23         Increase Amanda [name]'s confidence in coping with PTSD symptoms by assigning them to list at least two positive actions or small successes daily in a journal; process these success experiences     Start:  11/06/23         Encourage Amanda [name] to practice problem solving skills 2 times per week for the next 8 weeks     Start:  11/06/23          Amanda [name] Amanda [name] to identify triggers to feelings that are associated with the trauma by completing assigned exercises; process the material that is produced     Start:  11/06/23         Coping Skills      Start:  11/06/23       Will work with the pt using CBT/DBT techniques to help the pt verbalize an understanding of the cognitive, physiological, and behavioral components of trauma sxs and its treatment. This will be done by using worksheets, interactive activities, CBT/ABC thought logs, modeling, homework, role playing and journaling. Will work with pt to learn and implement coping skills that result in a reduction of trauma sxs and improve daily functioning per pt self report 3 out of 5 documented sessions.          ProgressTowards Goals: Progressing  Interventions: Supportive, Reframing, and Other: EMDR  Summary: Amanda [name] is a 28 y.o. female who presents with symptoms of trauma and anxiety. Patient identifies symptoms to include difficulty within interpersonal relationships, uncontrollable worry, hypervigilance, negative self affect, and irritability. Pt was oriented times 5. Pt was cooperative and engaged. Pt denies SI/HI/AVH.   Patient utilized therapeutic space to continue to process communication within personal relationships.  Patient reflected on how her own personal growth has improved her ability to establish healthy boundaries, communicate her feelings, and remove herself from unhealthy situations.  The clinician and patient continued processing previous traumatic memories through EMDR.  Patient reports subjective units of distress decreased from a score of 5 to a score of 0.  Patient further instilled the adaptive belief I am good enough.  Patient reflected on her improvements in her overall self-esteem and relationship with herself since beginning EMDR and instilling the belief she is good enough.  Suicidal/Homicidal: Nowithout intent/plan  Therapist Response:  Clinician utilized active and supportive reflection to create a safe space for patient to process recent life events. Clinician assessed for current symptoms, stressors, and safety since last session.  Reflected on use of healthy communication and  establishment of boundaries.  Continued EMDR treatment processing her target sequence plan.  Plan: Return again in 2 weeks.  Diagnosis: GAD (generalized anxiety disorder)  MDD (major depressive disorder), recurrent, in full remission (HCC)  Insomnia due to medical condition  PTSD (post-traumatic stress disorder)    Collaboration of Care: AEB psychiatrist can access notes and cln. Will review psychiatrists' notes. Check in with the patient and will see LCSW per availability. Patient agreed with treatment recommendations.   Patient/Guardian was advised Release of Information must be obtained prior to any record release in order to collaborate their care with an outside provider. Patient/Guardian was advised if they have not already done so to contact the registration department to sign all necessary forms in order for us  to release information regarding their care.   Consent: Patient/Guardian gives verbal consent for treatment and assignment of benefits for services provided during this visit. Patient/Guardian expressed understanding and agreed to proceed.   [name] Husband, LCSW 07/22/2024

## 2024-07-23 ENCOUNTER — Encounter (INDEPENDENT_AMBULATORY_CARE_PROVIDER_SITE_OTHER): Payer: Self-pay | Admitting: Family Medicine

## 2024-07-23 ENCOUNTER — Ambulatory Visit (INDEPENDENT_AMBULATORY_CARE_PROVIDER_SITE_OTHER): Admitting: Family Medicine

## 2024-07-23 VITALS — BP 97/68 | HR 86 | Temp 98.0°F | Ht 63.0 in | Wt 176.0 lb

## 2024-07-23 DIAGNOSIS — E88819 Insulin resistance, unspecified: Secondary | ICD-10-CM

## 2024-07-23 DIAGNOSIS — F39 Unspecified mood [affective] disorder: Secondary | ICD-10-CM | POA: Diagnosis not present

## 2024-07-23 DIAGNOSIS — E669 Obesity, unspecified: Secondary | ICD-10-CM

## 2024-07-23 DIAGNOSIS — F5089 Other specified eating disorder: Secondary | ICD-10-CM

## 2024-07-23 DIAGNOSIS — Z6831 Body mass index (BMI) 31.0-31.9, adult: Secondary | ICD-10-CM

## 2024-07-23 NOTE — Progress Notes (Signed)
 Amanda [name], D.O.  ABFM, ABOM Specializing in Clinical Bariatric Medicine  Office located at: [address] [place], [place]  [address]   Assessment and Plan:    FOR THE DISEASE OF OBESITY:  Obesity (BMI 30-39.9) BMI 31.0-31.9,adult current 31.18 Assessment & Plan: Since last office visit on 07/09/24 patient's muscle mass has decreased by 2.2 lbs. Fat mass has increased by 4.6 lbs. Total body water has increased by 0.8 lbs. Counseling done on how various foods will affect these numbers and how to maximize success  Total lbs lost to date: +8 lbs Total weight loss percentage to date: +4.76 %   Recommended Dietary Goals Trinady is currently in the action stage of change. As such, her goal is to continue weight management plan.   Follow recommended journaling goals of 1450-1550 calories and 110+++ g of protein using CAT 3 MP w L options and w/o snack calories as a guide.   Behavioral Intervention We discussed the following today: increasing lean protein intake to established goals, decreasing simple carbohydrates , increasing vegetables, increasing lower glycemic fruits, work on tracking and journaling calories using tracking application, decreasing eating out or consumption of processed foods, and making healthy choices when eating convenient foods, and staying on track while traveling and vacationing (has beach trip in a few weeks).   Additional resources provided today: Handout on Daily Food Journaling Log   Evidence-based interventions for health behavior change were utilized today including the discussion of self monitoring techniques, problem-solving barriers and SMART goal setting techniques.   Regarding patient's less desirable eating habits and patterns, we employed the technique of small changes.   Pt will specifically work on: Focus on increasing mindfulness while journaling her daily calorie/protein intake (even when eating out) and prioritizing protein intake at  every meal.   Recommended Physical Activity Goals Kimra has been advised to work up to 300-450 minutes of moderate intensity aerobic activity a week and strengthening exercises 2-3 times per week for cardiovascular health, weight loss maintenance and preservation of muscle mass.   She has agreed to: Continue current level of physical activity    Pharmacotherapy We both agreed to: Continue with current nutritional and behavioral strategies and continue with medications that aid in wt loss (Topamax ).    ASSOCIATED CONDITIONS ADDRESSED TODAY:  Mood disorder (HCC) - emotional eating Assessment & Plan: Moods are stable. Follows up with her therapist and Dr. Sharron. Currently on Buspar  20 mg BID and Lexapro  20 mg daily. Compliant/tolerant to current med regimen. Pt reports struggling with craving snacks since her meal plan was changed to no snack calories.   Counseled pt on improving positive self-talk. Reviewed how the foods she eats will affect her mood and emotional eating. Discussed how prioritizing meeting her protein intake goal will help with cravings for snacks. Advised she continue f/u with therapist and Dr. Sharron for continued support and monitoring.    Insulin  resistance Assessment & Plan: Lab Results  Component Value Date   HGBA1C 5.2 05/05/2024   HGBA1C 5.3 01/07/2015   INSULIN  5.8 05/05/2024    Not on any meds currently. Diet/lifestyle approach. Hunger is overall well controlled. Pt reports increased snack cravings.   Stressed importance of following her meal plan by increasing lean proteins, decreasing simple carbs and sugars, reducing times he is eating out, and decreasing saturated and trans fats. Reviewed how these habits help promote better control of insulin  sensitivity and snack cravings. Continue exercise regimen. Will continue monitoring.  Follow up:   Return in about 2 weeks (around 08/06/2024) for f/u on 08/07/2024 at 11:20 AM.  She was informed of the  importance of frequent follow up visits to maximize her success with intensive lifestyle modifications for her multiple health conditions.  Subjective:   Chief complaint: Obesity Kirstine is here to discuss her progress with her obesity treatment plan. She is on keeping a food journal and adhering to recommended goals of 1450-1550 calories and 100+ g of protein using CAT 3 MP w L options as a guide - w/o snack calories. States she is following her eating plan approximately 75% of the time. She states she is doing cardio and strength training 60 minutes 5 days per week.  Interval History:  Sashia R Dudgeon is here for a follow up office visit. Since last OV on 07/09/24, she is up 2 lbs. Overall, she is journaling daily calorie/protein intake. Reports focusing on eating whole foods, getting recommended protein goals, drinking enough protein, not skipping meals, and is sleeping 7-9 hours/night.   She was on a work trip in [place] for 3-4 days and was out of her usual routine. She was not journaling while on this trip. She identifies her biggest challenge as having increased snack cravings since her meal plan was changed to not having any snack calories. Although she is eating out with her co-workers for most of her meals, they were visiting healthy restaurants and she made sure to order foods that are on plan. For breakfast, she was eating eggs and yogurt. While eating out, she also was watching her portion sizes. Reports eating salmon and broccoli when eating at a [employer]. While at home she has been eating more home cooked meals. Regarding exercise, she has not been going to the gym as regularly as she previously was prior to her honeymoon to [place] and her recent work trip. She does, however, plan on getting back on track with this. Her clothes are fitting loose.   Pharmacotherapy that aid with weight loss: She is currently taking Topamax  75 mg once nightly.   Review of Systems:  Pertinent  positives were addressed with patient today.  Reviewed by clinician on day of visit: allergies, medications, problem list, medical history, surgical history, family history, social history, and previous encounter notes.  Weight Summary and Biometrics   Weight Lost Since Last Visit: 0  Weight Gained Since Last Visit: 2lb   Vitals Temp: 98 F (36.7 C) BP: 97/68 Pulse Rate: 86 SpO2: 99 %   Anthropometric Measurements Height: 5' 3 (1.6 m) Weight: 176 lb (79.8 kg) BMI (Calculated): 31.18 Weight at Last Visit: 174lb Weight Lost Since Last Visit: 0 Weight Gained Since Last Visit: 2lb Starting Weight: 168lb Total Weight Loss (lbs): 0 lb (0 kg) Peak Weight: 180lb Waist Measurement : 31 inches   Body Composition  Body Fat %: 37.9 % Fat Mass (lbs): 66.8 lbs Muscle Mass (lbs): 104 lbs Total Body Water (lbs): 74.8 lbs Visceral Fat Rating : 6   Other Clinical Data RMR: 2016 Fasting: no Labs: no Today's Visit #: 6 Starting Date: 05/05/24 Comments: Cat 2    Objective:   PHYSICAL EXAM: Blood pressure 97/68, pulse 86, temperature 98 F (36.7 C), height 5' 3 (1.6 m), weight 176 lb (79.8 kg), last menstrual period 07/12/2024, SpO2 99%. Body mass index is 31.18 kg/m.  General: she is overweight, cooperative and in no acute distress. PSYCH: Has normal mood, affect and thought process.   HEENT: EOMI, sclerae are  anicteric. Lungs: Normal breathing effort, no conversational dyspnea. Extremities: Moves * 4 Neurologic: A and O * 3, good insight  DIAGNOSTIC DATA REVIEWED: BMET    Component Value Date/Time   NA 139 05/05/2024 1013   K 4.3 05/05/2024 1013   CL 101 05/05/2024 1013   CO2 22 05/05/2024 1013   GLUCOSE 90 05/05/2024 1013   GLUCOSE 82 03/14/2024 1626   BUN 12 05/05/2024 1013   CREATININE 0.74 05/05/2024 1013   CREATININE 0.79 03/14/2024 1626   CALCIUM 9.7 05/05/2024 1013   GFRNONAA >60 08/18/2023 1431   GFRAA 131 06/28/2020 0930   Lab Results   Component Value Date   HGBA1C 5.2 05/05/2024   HGBA1C 5.3 01/07/2015   Lab Results  Component Value Date   INSULIN  5.8 05/05/2024   Lab Results  Component Value Date   TSH 1.540 05/05/2024   CBC    Component Value Date/Time   WBC 11.8 (H) 05/05/2024 1013   WBC 10.2 08/18/2023 1431   RBC 4.92 05/05/2024 1013   RBC 4.15 08/18/2023 1431   HGB 14.4 05/05/2024 1013   HCT 44.1 05/05/2024 1013   PLT 249 05/05/2024 1013   MCV 90 05/05/2024 1013   MCH 29.3 05/05/2024 1013   MCH 29.9 08/18/2023 1431   MCHC 32.7 05/05/2024 1013   MCHC 33.9 08/18/2023 1431   RDW 11.9 05/05/2024 1013   Iron Studies No results found for: IRON, TIBC, FERRITIN, IRONPCTSAT Lipid Panel     Component Value Date/Time   CHOL 127 05/05/2024 1013   TRIG 62 05/05/2024 1013   HDL 59 05/05/2024 1013   CHOLHDL 2.2 05/05/2024 1013   LDLCALC 55 05/05/2024 1013   Hepatic Function Panel     Component Value Date/Time   PROT 7.6 05/05/2024 1013   ALBUMIN 4.8 05/05/2024 1013   AST 17 05/05/2024 1013   ALT 6 05/05/2024 1013   ALKPHOS 79 05/05/2024 1013   BILITOT 0.4 05/05/2024 1013   BILIDIR 0.2 08/18/2023 1431   IBILI 0.8 08/18/2023 1431      Component Value Date/Time   TSH 1.540 05/05/2024 1013   Nutritional Lab Results  Component Value Date   VD25OH 33.6 05/05/2024   VD25OH 27.3 (L) 06/28/2020    Attestations:   LILLETTE Vernell Forest, acting as a medical scribe for Amanda Jenkins, DO., have compiled all relevant documentation for today's office visit on behalf of Amanda Jenkins, DO, while in the presence of [employer], DO.  I have reviewed the above documentation for accuracy and completeness, and I agree with the above. Amanda JINNY [name], D.O.  The 21st Century Cures Act was signed into law in 2016 which includes the topic of electronic health records.  This provides immediate access to information in MyChart.  This includes consultation notes, operative notes, office notes, lab  results and pathology reports.  If you have any questions about what you read please let us  know at your next visit so we can discuss your concerns and take corrective action if need be.  We are right here with you.

## 2024-07-23 NOTE — Addendum Note (Signed)
 Addended by: Joziah Dollins J on: 07/23/2024 10:29 AM   Modules accepted: Level of Service

## 2024-08-07 ENCOUNTER — Encounter (INDEPENDENT_AMBULATORY_CARE_PROVIDER_SITE_OTHER): Payer: Self-pay | Admitting: Family Medicine

## 2024-08-07 ENCOUNTER — Ambulatory Visit (INDEPENDENT_AMBULATORY_CARE_PROVIDER_SITE_OTHER): Admitting: Family Medicine

## 2024-08-07 VITALS — BP 108/69 | HR 63 | Temp 98.2°F | Ht 63.0 in | Wt 175.0 lb

## 2024-08-07 DIAGNOSIS — E88819 Insulin resistance, unspecified: Secondary | ICD-10-CM | POA: Diagnosis not present

## 2024-08-07 DIAGNOSIS — E669 Obesity, unspecified: Secondary | ICD-10-CM | POA: Diagnosis not present

## 2024-08-07 DIAGNOSIS — Z6829 Body mass index (BMI) 29.0-29.9, adult: Secondary | ICD-10-CM | POA: Insufficient documentation

## 2024-08-07 DIAGNOSIS — Z6831 Body mass index (BMI) 31.0-31.9, adult: Secondary | ICD-10-CM | POA: Diagnosis not present

## 2024-08-07 MED ORDER — METFORMIN HCL 500 MG PO TABS
ORAL_TABLET | ORAL | 0 refills | Status: DC
Start: 2024-08-07 — End: 2024-09-11

## 2024-08-07 NOTE — Progress Notes (Signed)
 Amanda [name], D.O.  ABFM, ABOM Specializing in Clinical Bariatric Medicine  Office located at: [address] [place], [place]  [address]   Assessment and Plan:   Meds ordered this encounter  Medications   metFORMIN  (GLUCOPHAGE ) 500 MG tablet    Sig: 1 po with lunch daily    Dispense:  30 tablet    Refill:  0    30 d supply;  ** OV for RF **   Do not send RF request      FOR THE DISEASE OF OBESITY:  BMI 31.0-31.9,adult current 31.01 Starting BMI 29.0-29.9,adult 29.98 Assessment & Plan: Since last office visit on 07/23/2024 patient's muscle mass has decreased by 0.6 lbs. Fat mass has decreased by 0.6 lbs. Total body water has increased by 0.2 lbs.  Body fat % has decreased by 0.1 %. Counseling done on how various foods will affect these numbers and how to maximize success  Total lbs lost to date: +7 lbs Total weight loss percentage to date: +4.17 %   Recommended Dietary Goals Amanda [name] is currently in the action stage of change. As such, her goal is to continue weight management plan.  She has agreed to: continue current plan   Behavioral Intervention We discussed the following today: increasing lean protein intake to established goals, decreasing simple carbohydrates , and continue to work on implementation of reduced calorie nutritional plan  Additional resources provided today: Handout on CAT 3 meal plan and Handout on risks/ benefits of metformin  and associated Myths of use  Evidence-based interventions for health behavior change were utilized today including the discussion of self monitoring techniques, problem-solving barriers and SMART goal setting techniques.   Regarding patient's less desirable eating habits and patterns, we employed the technique of small changes.   Goal: n/a    Recommended Physical Activity Goals Amanda [name] has been advised to work up to 300-450 minutes of moderate intensity aerobic activity a week and strengthening exercises 2-3 times per  week for cardiovascular health, weight loss maintenance and preservation of muscle mass.   She may Continue to gradually increase the amount and intensity of exercise routine   Pharmacotherapy See I.R note.   ASSOCIATED CONDITIONS ADDRESSED TODAY:   Insulin  resistance Assessment & Plan: Lab Results  Component Value Date   HGBA1C 5.2 05/05/2024   HGBA1C 5.3 01/07/2015   INSULIN  5.8 05/05/2024   Lab Results  Component Value Date   CREATININE 0.74 05/05/2024   BUN 12 05/05/2024   NA 139 05/05/2024   K 4.3 05/05/2024   CL 101 05/05/2024   CO2 22 05/05/2024   She has been more hungry and is having more cravings particularly in the afternoon. Upon interview, she has been eating foods that are either off-plan or not high in protein lately. She was encouraged to decrease simple/fatty carbs and increase lean proteins. Additionally, after discussion of risks/benefits, pt is agreeable to starting Metformin . Start with 0.5 tablet for 3-4 days and if tolerating well increase to 1 full tablet daily. Most recent renal parameters were within acceptable ranges; will cont monitoring.     Follow up:   Return 09/04/2024 9:40 AM.  She was informed of the importance of frequent follow up visits to maximize her success with intensive lifestyle modifications for her multiple health conditions.   Subjective:   Chief complaint: Obesity Amanda is here to discuss her progress with her obesity treatment plan. She is  journaling 1450-1550 calories and 110+++ g of protein using CAT 3 MP  w L options and w/o snack calories as a guide and states she is following her eating plan approximately 70% of the time. She states she is not exercising.   Interval History:  Atiyah R [name] is here for a follow up office visit. Since last OV on 07/23/2024 , she is down 1 lb. She has been busy with working more and family activities/vacation. As a result, she has had less time to prioritize healthy eating or journaling  her intake. She has been having hunger and cravings particularly in the afternoons.   Pharmacotherapy that aid with weight loss:  none    Review of Systems:  Pertinent positives were addressed with patient today.  Reviewed by clinician on day of visit: allergies, medications, problem list, medical history, surgical history, family history, social history, and previous encounter notes.  Weight Summary and Biometrics   Weight Lost Since Last Visit: 1 lb  Weight Gained Since Last Visit: 0   Vitals Temp: 98.2 F (36.8 C) BP: 108/69 Pulse Rate: 63 SpO2: 100 %   Anthropometric Measurements Height: 5' 3 (1.6 m) Weight: 175 lb (79.4 kg) BMI (Calculated): 31.01 Weight at Last Visit: 176 LB Weight Lost Since Last Visit: 1 lb Weight Gained Since Last Visit: 0 Starting Weight: 168 LB Total Weight Loss (lbs): 0 lb (0 kg) Peak Weight: 180 LB Waist Measurement : 31 inches   Body Composition  Body Fat %: 37.8 % Fat Mass (lbs): 66.2 lbs Muscle Mass (lbs): 103.4 lbs Total Body Water (lbs): 75 lbs Visceral Fat Rating : 6   Other Clinical Data RMR: 2016 Fasting: NO Labs: NO Today's Visit #: 7 Starting Date: 05/05/24   Objective:   PHYSICAL EXAM: Blood pressure 108/69, pulse 63, temperature 98.2 F (36.8 C), height 5' 3 (1.6 m), weight 175 lb (79.4 kg), last menstrual period 07/12/2024, SpO2 100%. Body mass index is 31 kg/m.  General: she is overweight, cooperative and in no acute distress. PSYCH: Has normal mood, affect and thought process.   HEENT: EOMI, sclerae are anicteric. Lungs: Normal breathing effort, no conversational dyspnea. Extremities: Moves * 4 Neurologic: A and O * 3, good insight  DIAGNOSTIC DATA REVIEWED: BMET    Component Value Date/Time   NA 139 05/05/2024 1013   K 4.3 05/05/2024 1013   CL 101 05/05/2024 1013   CO2 22 05/05/2024 1013   GLUCOSE 90 05/05/2024 1013   GLUCOSE 82 03/14/2024 1626   BUN 12 05/05/2024 1013   CREATININE 0.74  05/05/2024 1013   CREATININE 0.79 03/14/2024 1626   CALCIUM 9.7 05/05/2024 1013   GFRNONAA >60 08/18/2023 1431   GFRAA 131 06/28/2020 0930   Lab Results  Component Value Date   HGBA1C 5.2 05/05/2024   HGBA1C 5.3 01/07/2015   Lab Results  Component Value Date   INSULIN  5.8 05/05/2024   Lab Results  Component Value Date   TSH 1.540 05/05/2024   CBC    Component Value Date/Time   WBC 11.8 (H) 05/05/2024 1013   WBC 10.2 08/18/2023 1431   RBC 4.92 05/05/2024 1013   RBC 4.15 08/18/2023 1431   HGB 14.4 05/05/2024 1013   HCT 44.1 05/05/2024 1013   PLT 249 05/05/2024 1013   MCV 90 05/05/2024 1013   MCH 29.3 05/05/2024 1013   MCH 29.9 08/18/2023 1431   MCHC 32.7 05/05/2024 1013   MCHC 33.9 08/18/2023 1431   RDW 11.9 05/05/2024 1013   Iron Studies No results found for: IRON, TIBC, FERRITIN, IRONPCTSAT Lipid Panel  Component Value Date/Time   CHOL 127 05/05/2024 1013   TRIG 62 05/05/2024 1013   HDL 59 05/05/2024 1013   CHOLHDL 2.2 05/05/2024 1013   LDLCALC 55 05/05/2024 1013   Hepatic Function Panel     Component Value Date/Time   PROT 7.6 05/05/2024 1013   ALBUMIN 4.8 05/05/2024 1013   AST 17 05/05/2024 1013   ALT 6 05/05/2024 1013   ALKPHOS 79 05/05/2024 1013   BILITOT 0.4 05/05/2024 1013   BILIDIR 0.2 08/18/2023 1431   IBILI 0.8 08/18/2023 1431      Component Value Date/Time   TSH 1.540 05/05/2024 1013   Nutritional Lab Results  Component Value Date   VD25OH 33.6 05/05/2024   VD25OH 27.3 (L) 06/28/2020    Attestations:   I, Special Puri, acting as a Stage manager for Amanda Jenkins, DO., have compiled all relevant documentation for today's office visit on behalf of Amanda Jenkins, DO, while in the presence of [employer], DO.  I have reviewed the above documentation for accuracy and completeness, and I agree with the above. Amanda JINNY [name], D.O.  The 21st Century Cures Act was signed into law in 2016 which includes the topic of  electronic health records.  This provides immediate access to information in MyChart.  This includes consultation notes, operative notes, office notes, lab results and pathology reports.  If you have any questions about what you read please let us  know at your next visit so we can discuss your concerns and take corrective action if need be.  We are right here with you.

## 2024-08-12 ENCOUNTER — Telehealth (INDEPENDENT_AMBULATORY_CARE_PROVIDER_SITE_OTHER): Payer: Self-pay | Admitting: Psychology

## 2024-08-12 ENCOUNTER — Telehealth (INDEPENDENT_AMBULATORY_CARE_PROVIDER_SITE_OTHER): Admitting: Psychology

## 2024-08-12 NOTE — Telephone Encounter (Signed)
  Office: [phone]  /  Fax: [phone]  Date of Call: August 12, 2024  Provider: Wyatt Fire, PsyD  CONTENT: This provider called Amanda [name] to check-in as she did not present for today's MyChart Video Visit appointment. A HIPAA compliant voicemail was left requesting a call back. Of note, this provider stayed on the MyChart Video Visit appointment for 5 minutes prior to signing off per the clinic's grace period policy.    PLAN: This provider will wait for Amanda [name] to call back. No further follow-up planned by this provider.

## 2024-08-12 NOTE — Progress Notes (Unsigned)
  Office: [phone]  /  Fax: [phone]    Date: August 12, 2024  Appointment Start Time: *** Duration: *** minutes Provider: Wyatt Fire, Psy.D. Type of Session: Individual Therapy  Location of Patient: {gbptloc:23249} (private location) Location of Provider: Provider's Home (private office) Type of Contact: Telepsychological Visit via MyChart Video Visit  Session Content: This provider called Ayde at 4:04pm as she did not present for today's appointment. *** Promyse was observed joining shortly after. As such, today's appointment was initiated *** minutes late.Danyelle is a 28 y.o. female presenting for a follow-up appointment to address the previously established treatment goal of increasing coping skills.Today's appointment was a telepsychological visit. Aaliyah provided verbal consent for today's telepsychological appointment and she is aware she is responsible for securing confidentiality on her end of the session. Prior to proceeding with today's appointment, Arlina's physical location at the time of this appointment was obtained as well a phone number she could be reached at in the event of technical difficulties. Vernona and this provider participated in today's telepsychological service.   This provider conducted a brief check-in. *** Keia was receptive to today's appointment as evidenced by openness to sharing, responsiveness to feedback, and {gbreceptiveness:23401}.  Mental Status Examination:  Appearance: {Appearance:22431} Behavior: {Behavior:22445} Mood: {gbmood:21757} Affect: {Affect:22436} Speech: {Speech:22432} Eye Contact: {Eye Contact:22433} Psychomotor Activity: {Motor Activity:22434} Gait: {gbgait:23404} Thought Process: {thought process:22448}  Thought Content/Perception: {disturbances:22451} Orientation: {Orientation:22437} Memory/Concentration: {gbcognition:22449} Insight: {Insight:22446} Judgment: {Insight:22446}  Interventions:  {Interventions for Progress  Notes:23405}  DSM-5 Diagnosis(es): {Diagnoses:22752}  Treatment Goal & Progress: During the initial appointment with this provider, the following treatment goal was established: increase coping skills. Money has demonstrated progress in her goal as evidenced by {gbtxprogress:22839}. Dashanae also {gbtxprogress2:22951}.  Plan: The next appointment is scheduled for *** at ***, which will be via MyChart Video Visit. The next session will focus on {Plan for Next Appointment:23400}.   Wyatt Fire, PsyD

## 2024-08-13 ENCOUNTER — Ambulatory Visit: Admitting: Physician Assistant

## 2024-08-14 ENCOUNTER — Encounter: Payer: Self-pay | Admitting: Licensed Clinical Social Worker

## 2024-08-14 ENCOUNTER — Ambulatory Visit (INDEPENDENT_AMBULATORY_CARE_PROVIDER_SITE_OTHER): Admitting: Licensed Clinical Social Worker

## 2024-08-14 DIAGNOSIS — Z91199 Patient's noncompliance with other medical treatment and regimen due to unspecified reason: Secondary | ICD-10-CM

## 2024-08-14 NOTE — Progress Notes (Signed)
 Clinician attempted session via face-to-face, but Amanda [name] did not appear for her session. Pt was sent an attendance warning letter.

## 2024-08-26 ENCOUNTER — Ambulatory Visit (INDEPENDENT_AMBULATORY_CARE_PROVIDER_SITE_OTHER): Admitting: Licensed Clinical Social Worker

## 2024-08-26 ENCOUNTER — Other Ambulatory Visit: Payer: Self-pay | Admitting: Psychiatry

## 2024-08-26 DIAGNOSIS — F4310 Post-traumatic stress disorder, unspecified: Secondary | ICD-10-CM

## 2024-08-26 DIAGNOSIS — F3342 Major depressive disorder, recurrent, in full remission: Secondary | ICD-10-CM

## 2024-08-26 DIAGNOSIS — G4701 Insomnia due to medical condition: Secondary | ICD-10-CM

## 2024-08-26 DIAGNOSIS — F411 Generalized anxiety disorder: Secondary | ICD-10-CM
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
## 2024-09-02 ENCOUNTER — Ambulatory Visit: Payer: Self-pay

## 2024-09-02 NOTE — Telephone Encounter (Signed)
 Copied from CRM [id number]. Topic: Clinical - Red Word Triage >> Sep 02, 2024  1:55 PM Amanda [name] wrote: Red Word that prompted transfer to Nurse Triage: Patient says since last Thursday has had a sore throat, difficulty breathing, and gets winded easily when talking.  Wants to [facility] to [facility]. Answer Assessment - Initial Assessment Questions 1. ONSET: When did the throat start hurting? (Hours or days ago)      Last Thursday 2. SEVERITY: How bad is the sore throat? (Scale 1-10; mild, moderate or severe)     5 3. STREP EXPOSURE: Has there been any exposure to strep within the past week? If Yes, ask: What type of contact occurred?      no 4.  VIRAL SYMPTOMS: Are there any symptoms of a cold, such as a runny nose, cough, hoarse voice or red eyes?      Cough, SOB with exertion, chest tight 5. FEVER: Do you have a fever? If Yes, ask: What is your temperature, how was it measured, and when did it start?     no 6. PUS ON THE TONSILS: Is there pus on the tonsils in the back of your throat?     no 7. OTHER SYMPTOMS: Do you have any other symptoms? (e.g., difficulty breathing, headache, rash)     fatigue 8. PREGNANCY: Is there any chance you are pregnant? When was your last menstrual period?     no  Protocols used: Sore Throat-A-AH

## 2024-09-02 NOTE — Telephone Encounter (Signed)
 FYI Only or Action Required?: Action required by provider: request for appointment.  Patient was last seen in primary care on 08/07/2024 by Midge Sober, DO.  Called Nurse Triage reporting Sore Throat.  Symptoms began several days ago.  Interventions attempted: Rest, hydration, or home remedies.  Symptoms are: gradually worsening.Pt. Will re-establish at [facility]. Going to [facility] today.  Triage Disposition: No disposition on file.  Patient/caregiver understands and will follow disposition?:

## 2024-09-03 ENCOUNTER — Other Ambulatory Visit (INDEPENDENT_AMBULATORY_CARE_PROVIDER_SITE_OTHER): Payer: Self-pay | Admitting: Family Medicine

## 2024-09-04 ENCOUNTER — Ambulatory Visit (INDEPENDENT_AMBULATORY_CARE_PROVIDER_SITE_OTHER): Admitting: Family Medicine

## 2024-09-09 ENCOUNTER — Ambulatory Visit (INDEPENDENT_AMBULATORY_CARE_PROVIDER_SITE_OTHER): Admitting: Licensed Clinical Social Worker

## 2024-09-09 DIAGNOSIS — F3342 Major depressive disorder, recurrent, in full remission: Secondary | ICD-10-CM

## 2024-09-09 DIAGNOSIS — F411 Generalized anxiety disorder: Secondary | ICD-10-CM

## 2024-09-09 DIAGNOSIS — F4310 Post-traumatic stress disorder, unspecified: Secondary | ICD-10-CM
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
## 2024-09-11 ENCOUNTER — Ambulatory Visit (INDEPENDENT_AMBULATORY_CARE_PROVIDER_SITE_OTHER): Admitting: Family Medicine

## 2024-09-11 VITALS — BP 103/65 | HR 66 | Temp 98.4°F | Ht 63.0 in | Wt 173.0 lb

## 2024-09-11 DIAGNOSIS — Z6829 Body mass index (BMI) 29.0-29.9, adult: Secondary | ICD-10-CM

## 2024-09-11 DIAGNOSIS — E559 Vitamin D deficiency, unspecified: Secondary | ICD-10-CM

## 2024-09-11 DIAGNOSIS — E88819 Insulin resistance, unspecified: Secondary | ICD-10-CM | POA: Diagnosis not present

## 2024-09-11 DIAGNOSIS — Z6831 Body mass index (BMI) 31.0-31.9, adult: Secondary | ICD-10-CM

## 2024-09-11 MED ORDER — METFORMIN HCL ER 500 MG PO TB24: 500.0000 mg | ORAL_TABLET | Freq: Every day | ORAL | 1 refills | Status: DC

## 2024-09-11 NOTE — Progress Notes (Signed)
 Amanda [name], D.O.  ABFM, AB[facility] Specializing in Clinical Bariatric Medicine  Office located at: [address] [place], [place]  [address]      A) FOR THE CHRONIC DISEASE OF OBESITY:  Chief complaint: Obesity Amanda [name] is here to discuss her progress with her obesity treatment plan.   History of present illness / Interval history:  Amanda [name] is here today for her follow-up office visit.  Since last OV on 08/07/24, pt is down 2 lbs .    08/07/24 11:00 09/11/24 11:00   Body Fat % 37.8 % 35.1 %  Muscle Mass (lbs) 103.4 lbs 106.8 lbs  Fat Mass (lbs) 66.2 lbs 60.8 lbs  Total Body Water (lbs) 75 lbs 74.8 lbs    Counseling done on how various foods will affect these numbers and how to maximize success   Total lbs lost to date: + 5 lbs Total Fat Mass in lbs lost to date:  Total weight loss percentage to date: + 2.98 %  Pt states that she is doing good and is continuing to try to be more consistent. She recently had a sinus infection that turned to bronchitis that then turned to pneumonia. After her being sick her daughters also fell sick. Pt endorses still trying to go to they gym even when she was sick even if she was weak and couldn't lift any heavy weights.She reports that her entire family has moved over to eating lean meats like chicken and fish and when they were sick they had a lot of broth.    Starting BMI 29.0-29.9,adult start BMI 29.76  BMI 31.0-31.9,adult current 30.65  Nutrition Therapy She is journaling 1450-1550 calories and 110+++ g of protein using CAT 3 MP w L options and w/o snack calories as a guide and states she is following her eating plan approximately 75 % of the time.   - Tracking Calories/Macros: no   - Eating More Whole Foods: yes  - Adequate Protein Intake: yes  - Adequate Water Intake: yes  - Skipping Meals: no   - Sleeping 7-9 Hours/ Night: yes   Brantley is currently in the action stage of change. As such, her goal is to continue  weight management plan.  She has agreed to: Switch to Cat 2 with B/L options  (due to increased dose of metformin )    Physical Activity Pt is doing cardio or strength training  60 minutes 3-5 days per week   Tilla has been advised to work up to 300-450 minutes of moderate intensity aerobic activity a week and strengthening exercises 2-3 times per week for cardiovascular health, weight loss maintenance and preservation of muscle mass.  She has agreed to : Increase physical activity in their day and reduce sedentary time (increase NEAT)., Increase volume of physical activity to a goal of 240 minutes a week, and Combine aerobic and strengthening exercises for efficiency and improved cardiometabolic health.   Behavioral Modifications Evidence-based interventions for health behavior change were utilized today including the discussion of  1) self monitoring techniques:       - being consistent   2) problem-solving barriers:    3) self care:    4) SMART goals for next OV:  n/a  Regarding patient's less desirable eating habits and patterns, we employed the technique of small changes.   We discussed the following today: avoiding skipping meals and work on tracking and journaling calories using tracking application Additional resources provided today: Handout on CAT 2 meal plan,  Handout on CAT 1-2 breakfast options, and Handout on CAT 1-2 lunch options   Medical Interventions/ Pharmacotherapy Previous Bariatric surgery: n/a Pharmacotherapy for weight loss: She is currently taking Metformin  1 tablet after lunch  for medical weight loss.    We discussed various medication options to help Deana with her weight loss efforts and we both agreed to : Continue with current nutritional and behavioral strategies and weight loss medication.   B) OBESITY RELATED CONDITIONS ADDRESSED TODAY:   Insulin  resistance Assessment & Plan Lab Results  Component Value Date   HGBA1C 5.2 05/05/2024   HGBA1C  5.3 01/07/2015   INSULIN  5.8 05/05/2024    On Metformin  1 tablet after lunch. Good compliance and tolerance no adverse SE. Pt states that she has no excessive hunger and has helped control her cravings at the end of the day. Pt ran out of Metformin  9/21 and was not able to come in and get refill due to her being sick. Discussed with pt that she could increase her dosage tow twice daily to help control cravings even more throughout the day but pt cannot remember to take more than one pill a day. Mutual agreement to change to Metformin  XR once daily. Educated pt with risks/benefits. Pt gave verbal agreement. Will obtain labs today.     Vitamin D  deficiency Assessment & Plan Lab Results  Component Value Date   VD25OH 33.6 05/05/2024   VD25OH 27.3 (L) 06/28/2020   On ERGO 50 K once weekly. Good compliance and tolerance. No acute concerns. Pt's last Vit D levels were not at goal, between 50 and 70. Continue supplementation. Will recheck labs in future.   Medications Discontinued During This Encounter  Medication Reason   metFORMIN  (GLUCOPHAGE ) 500 MG tablet Dose change   metFORMIN  (GLUCOPHAGE ) 500 MG tablet Dose change     Meds ordered this encounter  Medications   metFORMIN  (GLUCOPHAGE -XR) 500 MG 24 hr tablet    Sig: Take 1 tablet (500 mg total) by mouth daily with lunch.    Dispense:  30 tablet    Refill:  1      Follow up:   Return 10/01/24 at 11:20 AM She was informed of the importance of frequent follow up visits to maximize her success with intensive lifestyle modifications for her multiple health conditions.   Weight Summary and Biometrics   Weight Lost Since Last Visit: 2lb  Weight Gained Since Last Visit: 0lb    Vitals Temp: 98.4 F (36.9 C) BP: 103/65 Pulse Rate: 66 SpO2: 98 %   Anthropometric Measurements Height: 5' 3 (1.6 m) Weight: 173 lb (78.5 kg) BMI (Calculated): 30.65 Weight at Last Visit: 175lb Weight Lost Since Last Visit: 2lb Weight Gained  Since Last Visit: 0lb Starting Weight: 168lb Total Weight Loss (lbs): 0 lb (0 kg) Peak Weight: 180lb Waist Measurement : 31 inches   Body Composition  Body Fat %: 35.1 % Fat Mass (lbs): 60.8 lbs Muscle Mass (lbs): 106.8 lbs Total Body Water (lbs): 74.8 lbs Visceral Fat Rating : 5   Other Clinical Data Fasting: no Labs: no Today's Visit #: 8 Starting Date: 05/05/24    Objective:   PHYSICAL EXAM: Blood pressure 103/65, pulse 66, temperature 98.4 F (36.9 C), height 5' 3 (1.6 m), weight 173 lb (78.5 kg), last menstrual period 09/07/2024, SpO2 98%. Body mass index is 30.65 kg/m.  General: she is overweight, cooperative and in no acute distress. PSYCH: Has normal mood, affect and thought process.   HEENT: EOMI, sclerae are  anicteric. Lungs: Normal breathing effort, no conversational dyspnea. Extremities: Moves * 4 Neurologic: A and O * 3, good insight  DIAGNOSTIC DATA REVIEWED: BMET    Component Value Date/Time   NA 139 05/05/2024 1013   K 4.3 05/05/2024 1013   CL 101 05/05/2024 1013   CO2 22 05/05/2024 1013   GLUCOSE 90 05/05/2024 1013   GLUCOSE 82 03/14/2024 1626   BUN 12 05/05/2024 1013   CREATININE 0.74 05/05/2024 1013   CREATININE 0.79 03/14/2024 1626   CALCIUM 9.7 05/05/2024 1013   GFRNONAA >60 08/18/2023 1431   GFRAA 131 06/28/2020 0930   Lab Results  Component Value Date   HGBA1C 5.2 05/05/2024   HGBA1C 5.3 01/07/2015   Lab Results  Component Value Date   INSULIN  5.8 05/05/2024   Lab Results  Component Value Date   TSH 1.540 05/05/2024   CBC    Component Value Date/Time   WBC 11.8 (H) 05/05/2024 1013   WBC 10.2 08/18/2023 1431   RBC 4.92 05/05/2024 1013   RBC 4.15 08/18/2023 1431   HGB 14.4 05/05/2024 1013   HCT 44.1 05/05/2024 1013   PLT 249 05/05/2024 1013   MCV 90 05/05/2024 1013   MCH 29.3 05/05/2024 1013   MCH 29.9 08/18/2023 1431   MCHC 32.7 05/05/2024 1013   MCHC 33.9 08/18/2023 1431   RDW 11.9 05/05/2024 1013   Iron  Studies No results found for: IRON, TIBC, FERRITIN, IRONPCTSAT Lipid Panel     Component Value Date/Time   CHOL 127 05/05/2024 1013   TRIG 62 05/05/2024 1013   HDL 59 05/05/2024 1013   CHOLHDL 2.2 05/05/2024 1013   LDLCALC 55 05/05/2024 1013   Hepatic Function Panel     Component Value Date/Time   PROT 7.6 05/05/2024 1013   ALBUMIN 4.8 05/05/2024 1013   AST 17 05/05/2024 1013   ALT 6 05/05/2024 1013   ALKPHOS 79 05/05/2024 1013   BILITOT 0.4 05/05/2024 1013   BILIDIR 0.2 08/18/2023 1431   IBILI 0.8 08/18/2023 1431      Component Value Date/Time   TSH 1.540 05/05/2024 1013   Nutritional Lab Results  Component Value Date   VD25OH 33.6 05/05/2024   VD25OH 27.3 (L) 06/28/2020    Attestations:   LILLETTE Sonny Laroche, acting as a Stage manager for Amanda Jenkins, DO., have compiled all relevant documentation for today's office visit on behalf of Amanda Jenkins, DO, while in the presence of [employer], DO.   I have reviewed the above documentation for accuracy and completeness, and I agree with the above. Amanda JINNY [name], D.O.  The 21st Century Cures Act was signed into law in 2016 which includes the topic of electronic health records.  This provides immediate access to information in MyChart.  This includes consultation notes, operative notes, office notes, lab results and pathology reports.  If you have any questions about what you read please let us  know at your next visit so we can discuss your concerns and take corrective action if need be.  We are right here with you.

## 2024-09-29 ENCOUNTER — Ambulatory Visit (INDEPENDENT_AMBULATORY_CARE_PROVIDER_SITE_OTHER): Admitting: Licensed Clinical Social Worker

## 2024-09-29 DIAGNOSIS — F4310 Post-traumatic stress disorder, unspecified: Secondary | ICD-10-CM

## 2024-09-29 DIAGNOSIS — F3342 Major depressive disorder, recurrent, in full remission: Secondary | ICD-10-CM | POA: Diagnosis not present

## 2024-09-29 DIAGNOSIS — F411 Generalized anxiety disorder: Secondary | ICD-10-CM
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
## 2024-09-30 ENCOUNTER — Ambulatory Visit

## 2024-10-01 ENCOUNTER — Ambulatory Visit (INDEPENDENT_AMBULATORY_CARE_PROVIDER_SITE_OTHER): Admitting: Family Medicine

## 2024-10-02 ENCOUNTER — Ambulatory Visit

## 2024-10-06 ENCOUNTER — Other Ambulatory Visit (INDEPENDENT_AMBULATORY_CARE_PROVIDER_SITE_OTHER): Payer: Self-pay | Admitting: Family Medicine

## 2024-10-06 ENCOUNTER — Encounter: Payer: Self-pay | Admitting: Psychiatry

## 2024-10-06 ENCOUNTER — Telehealth: Admitting: Psychiatry

## 2024-10-06 DIAGNOSIS — G4701 Insomnia due to medical condition: Secondary | ICD-10-CM

## 2024-10-06 DIAGNOSIS — F411 Generalized anxiety disorder: Secondary | ICD-10-CM

## 2024-10-06 DIAGNOSIS — F3342 Major depressive disorder, recurrent, in full remission: Secondary | ICD-10-CM

## 2024-10-06 DIAGNOSIS — F4310 Post-traumatic stress disorder, unspecified: Secondary | ICD-10-CM
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
## 2024-10-10 ENCOUNTER — Ambulatory Visit (INDEPENDENT_AMBULATORY_CARE_PROVIDER_SITE_OTHER): Admitting: Licensed Clinical Social Worker

## 2024-10-10 DIAGNOSIS — F4310 Post-traumatic stress disorder, unspecified: Secondary | ICD-10-CM | POA: Diagnosis not present

## 2024-10-10 DIAGNOSIS — F411 Generalized anxiety disorder: Secondary | ICD-10-CM | POA: Diagnosis not present

## 2024-10-10 DIAGNOSIS — F3342 Major depressive disorder, recurrent, in full remission: Secondary | ICD-10-CM | POA: Diagnosis not present
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
## 2024-10-14 ENCOUNTER — Ambulatory Visit (INDEPENDENT_AMBULATORY_CARE_PROVIDER_SITE_OTHER): Admitting: Licensed Clinical Social Worker

## 2024-10-14 DIAGNOSIS — F411 Generalized anxiety disorder: Secondary | ICD-10-CM | POA: Diagnosis not present

## 2024-10-14 DIAGNOSIS — F4310 Post-traumatic stress disorder, unspecified: Secondary | ICD-10-CM | POA: Diagnosis not present

## 2024-10-14 DIAGNOSIS — F3342 Major depressive disorder, recurrent, in full remission: Secondary | ICD-10-CM | POA: Diagnosis not present
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
## 2024-10-16 ENCOUNTER — Encounter (INDEPENDENT_AMBULATORY_CARE_PROVIDER_SITE_OTHER): Payer: Self-pay | Admitting: Family Medicine

## 2024-10-16 ENCOUNTER — Ambulatory Visit (INDEPENDENT_AMBULATORY_CARE_PROVIDER_SITE_OTHER): Admitting: Family Medicine

## 2024-10-16 VITALS — BP 96/66 | HR 80 | Temp 98.1°F | Ht 63.0 in | Wt 175.0 lb

## 2024-10-16 DIAGNOSIS — Z79899 Other long term (current) drug therapy: Secondary | ICD-10-CM

## 2024-10-16 DIAGNOSIS — E88819 Insulin resistance, unspecified: Secondary | ICD-10-CM

## 2024-10-16 DIAGNOSIS — E669 Obesity, unspecified: Secondary | ICD-10-CM | POA: Diagnosis not present

## 2024-10-16 DIAGNOSIS — Z6831 Body mass index (BMI) 31.0-31.9, adult: Secondary | ICD-10-CM

## 2024-10-16 DIAGNOSIS — Z6829 Body mass index (BMI) 29.0-29.9, adult: Secondary | ICD-10-CM

## 2024-10-16 DIAGNOSIS — E559 Vitamin D deficiency, unspecified: Secondary | ICD-10-CM | POA: Diagnosis not present

## 2024-10-16 MED ORDER — METFORMIN HCL ER 500 MG PO TB24: 500.0000 mg | ORAL_TABLET | Freq: Every day | ORAL | 0 refills | Status: DC

## 2024-10-16 MED ORDER — VITAMIN D (ERGOCALCIFEROL) 1.25 MG (50000 UNIT) PO CAPS: 50000.0000 [IU] | ORAL_CAPSULE | ORAL | 1 refills | Status: DC

## 2024-10-16 NOTE — Progress Notes (Signed)
 Amanda [name], D.O.  ABFM, ABOM Specializing in Clinical Bariatric Medicine  Office located at: [address] [place], [place]  [address]    Obtain fasting labs at next OV.   A) FOR THE CHRONIC DISEASE OF OBESITY:  Chief complaint: Obesity [name] is here to discuss her progress with her obesity treatment plan.   History of present illness / Interval history:  Amanda [name] is here today for her follow-up office visit.  Since last OV on 09/11/24, pt is up 2 lbs. Pt states that she has not been journaling and had to take a step back from the gym due to lack of time. She endorses eating left overs but does not measure or weigh how much it was.      09/11/24 11:00 10/16/24 10:00   Body Fat % 35.1 % 37.3 %  Muscle Mass (lbs) 106.8 lbs 104.6 lbs  Fat Mass (lbs) 60.8 lbs 65.4 lbs  Total Body Water (lbs) 74.8 lbs 74 lbs  Visceral Fat Rating  5 6    Counseling done on how various foods will affect these numbers and how to maximize success   Total lbs lost to date: + 7 lbs Total Fat Mass in lbs lost to date: + 4 lbs Total weight loss percentage to date: + 4.17 %    Starting BMI 29.0-29.9,adult start BMI 29.76 BMI 31.0-31.9,adult current 31.01  Nutrition Therapy She is on the Category 2 Plan with B/L options and states she is following her eating plan approximately 90 % of the time.   - Tracking Calories/Macros: no   - Eating More Whole Foods: yes  - Adequate Protein Intake: yes  - Adequate Water Intake: yes  - Skipping Meals: no   - Sleeping 7-9 Hours/ Night: yes   Amanda [name] is currently in the action stage of change. As such, her goal is to continue weight management plan.  She has agreed to: journaling 1200-1300 calories with 90+ g protein    Physical Activity Amanda [name] is not exercising.   Amanda [name] has been advised to work up to 300-450 minutes of moderate intensity aerobic activity a week and strengthening exercises 2-3 times per week for cardiovascular health,  weight loss maintenance and preservation of muscle mass.  She has agreed to : Think about enjoyable ways to increase daily physical activity and overcoming barriers to exercise and Work on scheduling and tracking physical activity.    Behavioral Modifications Evidence-based interventions for health behavior change were utilized today including the discussion of  1) self monitoring techniques:    - Weigh veggies and measure lean proteins   2) problem-solving barriers:    - Focus on time management  3) SMART goals for next OV:    - Focus on journaling   Regarding patient's less desirable eating habits and patterns, we employed the technique of small changes.   We discussed the following today: increasing lean protein intake to established goals, work on meal planning and preparation, work on tracking and journaling calories using tracking application, keeping healthy foods at home, and focusing on food with a 10:1 ratio of calories: grams of protein  - Extensive review of how eating on plan helps lose weight and eating off plan deters and slows weight loss Additional resources provided today: Handout on traveling and holiday eating strategies, Handout on CAT 2 meal plan, and Handout on Daily Food Journaling Log   Medical Interventions/ Pharmacotherapy Previous Bariatric surgery: n/a Pharmacotherapy for weight loss: She is currently  taking Metformin  XR once daily and Topiramate  3 tablets daily for medical weight loss.    We discussed various medication options to help Amanda [name] with her weight loss efforts and we both agreed to : Continue with current nutritional and behavioral strategies   B) OBESITY RELATED CONDITIONS ADDRESSED TODAY:  Insulin  resistance Assessment & Plan Lab Results  Component Value Date   HGBA1C 5.2 05/05/2024   HGBA1C 5.3 01/07/2015   INSULIN  5.8 05/05/2024    On Metformin  XR once daily. Pt states that her cravings have decreased and she has no excessive hunger.  Pt states that her stomach recently has been messed up and has had diarrhea spells sometimes. Explained how eating off plan can cause GI upsets. Eating on plan and focusing on proteins and whole foods help avoid and minimize GI upsets. Pt wishes to decrease to 1/2 a tab daily. Continue following prudent meal plan and focusing on getting all lean protein in for the day.      Vitamin D  deficiency Assessment & Plan Lab Results  Component Value Date   VD25OH 33.6 05/05/2024   VD25OH 27.3 (L) 06/28/2020   Pt states that she has not been taking her Vit D supplementation. She endorses running out a couple weeks ago. She has not been taking it for 2-3 weeks now. Encouraged pt to restart supplementation. Will obtain labs and review at next OV.     Medications Discontinued During This Encounter  Medication Reason   Vitamin D , Ergocalciferol , (DRISDOL ) 1.25 MG (50000 UNIT) CAPS capsule Reorder   metFORMIN  (GLUCOPHAGE -XR) 500 MG 24 hr tablet Reorder     Meds ordered this encounter  Medications   metFORMIN  (GLUCOPHAGE -XR) 500 MG 24 hr tablet    Sig: Take 1 tablet (500 mg total) by mouth daily with lunch.    Dispense:  30 tablet    Refill:  0   Vitamin D , Ergocalciferol , (DRISDOL ) 1.25 MG (50000 UNIT) CAPS capsule    Sig: Take 1 capsule (50,000 Units total) by mouth every 7 (seven) days.    Dispense:  4 capsule    Refill:  1      Follow up:   Return 11/20/2024 at 8:00 AM  She was informed of the importance of frequent follow up visits to maximize her success with intensive lifestyle modifications for her multiple health conditions.   Weight Summary and Biometrics   Weight Lost Since Last Visit: 0  Weight Gained Since Last Visit: 2lb    Vitals Temp: 98.1 F (36.7 C) BP: 96/66 Pulse Rate: 80 SpO2: 98 %   Anthropometric Measurements Height: 5' 3 (1.6 m) Weight: 175 lb (79.4 kg) BMI (Calculated): 31.01 Weight at Last Visit: 173lb Weight Lost Since Last Visit: 0 Weight  Gained Since Last Visit: 2lb Starting Weight: 168lb Total Weight Loss (lbs): 0 lb (0 kg) Peak Weight: 180lb   Body Composition  Body Fat %: 37.3 % Fat Mass (lbs): 65.4 lbs Muscle Mass (lbs): 104.6 lbs Total Body Water (lbs): 74 lbs Visceral Fat Rating : 6   Other Clinical Data Fasting: no Labs: no Today's Visit #: 9 Starting Date: 05/05/24    Objective:   PHYSICAL EXAM: Blood pressure 96/66, pulse 80, temperature 98.1 F (36.7 C), height 5' 3 (1.6 m), weight 175 lb (79.4 kg), last menstrual period 10/06/2024, SpO2 98%. Body mass index is 31 kg/m.  General: she is overweight, cooperative and in no acute distress. PSYCH: Has normal mood, affect and thought process.   HEENT: EOMI, sclerae are  anicteric. Lungs: Normal breathing effort, no conversational dyspnea. Extremities: Moves * 4 Neurologic: A and O * 3, good insight  DIAGNOSTIC DATA REVIEWED: BMET    Component Value Date/Time   NA 139 05/05/2024 1013   K 4.3 05/05/2024 1013   CL 101 05/05/2024 1013   CO2 22 05/05/2024 1013   GLUCOSE 90 05/05/2024 1013   GLUCOSE 82 03/14/2024 1626   BUN 12 05/05/2024 1013   CREATININE 0.74 05/05/2024 1013   CREATININE 0.79 03/14/2024 1626   CALCIUM 9.7 05/05/2024 1013   GFRNONAA >60 08/18/2023 1431   GFRAA 131 06/28/2020 0930   Lab Results  Component Value Date   HGBA1C 5.2 05/05/2024   HGBA1C 5.3 01/07/2015   Lab Results  Component Value Date   INSULIN  5.8 05/05/2024   Lab Results  Component Value Date   TSH 1.540 05/05/2024   CBC    Component Value Date/Time   WBC 11.8 (H) 05/05/2024 1013   WBC 10.2 08/18/2023 1431   RBC 4.92 05/05/2024 1013   RBC 4.15 08/18/2023 1431   HGB 14.4 05/05/2024 1013   HCT 44.1 05/05/2024 1013   PLT 249 05/05/2024 1013   MCV 90 05/05/2024 1013   MCH 29.3 05/05/2024 1013   MCH 29.9 08/18/2023 1431   MCHC 32.7 05/05/2024 1013   MCHC 33.9 08/18/2023 1431   RDW 11.9 05/05/2024 1013   Iron Studies No results found for:  IRON, TIBC, FERRITIN, IRONPCTSAT Lipid Panel     Component Value Date/Time   CHOL 127 05/05/2024 1013   TRIG 62 05/05/2024 1013   HDL 59 05/05/2024 1013   CHOLHDL 2.2 05/05/2024 1013   LDLCALC 55 05/05/2024 1013   Hepatic Function Panel     Component Value Date/Time   PROT 7.6 05/05/2024 1013   ALBUMIN 4.8 05/05/2024 1013   AST 17 05/05/2024 1013   ALT 6 05/05/2024 1013   ALKPHOS 79 05/05/2024 1013   BILITOT 0.4 05/05/2024 1013   BILIDIR 0.2 08/18/2023 1431   IBILI 0.8 08/18/2023 1431      Component Value Date/Time   TSH 1.540 05/05/2024 1013   Nutritional Lab Results  Component Value Date   VD25OH 33.6 05/05/2024   VD25OH 27.3 (L) 06/28/2020    Attestations:   LILLETTE Sonny Laroche, acting as a stage manager for Amanda Jenkins, Amanda [name]., have compiled all relevant documentation for today's office visit on behalf of Amanda Jenkins, Amanda [name], while in the presence of [employer], Amanda [name].   I have reviewed the above documentation for accuracy and completeness, and I agree with the above. Amanda [name], D.O.  The 21st Century Cures Act was signed into law in 2016 which includes the topic of electronic health records.  This provides immediate access to information in MyChart.  This includes consultation notes, operative notes, office notes, lab results and pathology reports.  If you have any questions about what you read please let us  know at your next visit so we can discuss your concerns and take corrective action if need be.  We are right here with you.

## 2024-10-21 ENCOUNTER — Encounter: Payer: Self-pay | Admitting: Licensed Clinical Social Worker

## 2024-10-22 ENCOUNTER — Telehealth (INDEPENDENT_AMBULATORY_CARE_PROVIDER_SITE_OTHER): Admitting: Psychiatry

## 2024-10-22 ENCOUNTER — Encounter: Payer: Self-pay | Admitting: Psychiatry

## 2024-10-22 DIAGNOSIS — F411 Generalized anxiety disorder: Secondary | ICD-10-CM

## 2024-10-22 DIAGNOSIS — F3342 Major depressive disorder, recurrent, in full remission: Secondary | ICD-10-CM

## 2024-10-22 DIAGNOSIS — F4310 Post-traumatic stress disorder, unspecified: Secondary | ICD-10-CM

## 2024-10-22 DIAGNOSIS — G4701 Insomnia due to medical condition: Secondary | ICD-10-CM | POA: Diagnosis not present

## 2024-10-22 NOTE — Progress Notes (Signed)
 Virtual Visit via Video Note  I connected with Jeananne R Chouinard on 10/22/24 at  4:00 PM EST by a video enabled telemedicine application and verified that I am speaking with the correct person using two identifiers.  Location Provider Location : [facility] Patient Location : Home  Participants: Patient , Provider    I discussed the limitations of evaluation and management by telemedicine and the availability of in person appointments. The patient expressed understanding and agreed to proceed.    I discussed the assessment and treatment plan with the patient. The patient was provided an opportunity to ask questions and all were answered. The patient agreed with the plan and demonstrated an understanding of the instructions.   The patient was advised to call back or seek an in-person evaluation if the symptoms worsen or if the condition fails to improve as anticipated.   BH MD OP Progress Note  10/22/2024 4:22 PM MEENA BARRANTES  MRN:  [id number]  Chief Complaint:  Chief Complaint  Patient presents with   Follow-up   Anxiety   Depression   Medication Refill   Discussed the use of AI scribe software for clinical note transcription with the patient, who gave verbal consent to proceed.  History of Present Illness Amanda [name] is a 28 year old female, married, lives in [place], employed, has a history of GAD, MDD, insomnia was evaluated by telemedicine today for a follow-up appointment.  Compared to her last visit, she reports significant improvement in overall mood and functioning. She continues to participate in weekly therapy sessions, including EMDR, as often as her childcare arrangements allow. She describes working through new challenges with her therapist and finds therapy helpful.  She reports that home life has improved, and she notes a better relationship with her husband, which she attributes to his increased mental health stability and more frequent visits with his psychologist. She  is making efforts to incorporate more self-care and downtime, occasionally taking days to rest, though she finds it challenging to engage in activities outside of work and home for herself.  She reports that sleep difficulties persist but have improved since the last visit. She notes that the recent time change led to a few rough days of adjustment, but once asleep, she remains asleep. Her current medications include Lexapro  20 mg, Buspar  20 mg twice daily, and trazodone  25 mg (half tablet), which she uses more frequently than hydroxyzine  for sleep, as she prefers how trazodone  makes her feel in the morning. She reports using hydroxyzine  only a couple of times since the last visit.  She denies any thoughts of harming herself or others.   Visit Diagnosis:    ICD-10-CM   1. GAD (generalized anxiety disorder)  F41.1     2. MDD (major depressive disorder), recurrent, in full remission  F33.42     3. PTSD (post-traumatic stress disorder)  F43.10     4. Insomnia due to medical condition  G47.01    Mood symptoms, children needing help, Lack of sleep hygiene      Past Psychiatric History: I have reviewed past psychiatric history from progress note on 02/02/2022.  Past trials of Zoloft -did not work, Effexor -made her suicidal.  Past Medical History:  Past Medical History:  Diagnosis Date   Acne    Angio-edema    Anxiety    Back pain    BRCA negative 09/2020   MyRisk neg except AXIN2 VUS   Family history of adverse reaction to anesthesia    heart condition  Family history of breast cancer 09/2020   IBIS=15.7%/riskscore=14.7%   Family history of pancreatic cancer    Family history of thyroid  disease    GERD (gastroesophageal reflux disease)    Lactose intolerance    Migraine    Multiple food allergies    Urticaria     Past Surgical History:  Procedure Laterality Date   NO PAST SURGERIES     TONSILLECTOMY Bilateral 09/05/2022   Procedure: TONSILLECTOMY;  Surgeon: Blair Mt, MD;   Location: [facility];  Service: ENT;  Laterality: Bilateral;   TONSILLECTOMY     WISDOM TOOTH EXTRACTION      Family Psychiatric History: I have reviewed family psychiatric history from progress note on 02/02/2022.  Family History:  Family History  Problem Relation Age of Onset   Migraines Mother    Depression Mother    Anxiety disorder Mother    Allergic rhinitis Father    Skin cancer Father    Allergic rhinitis Brother    Allergies Brother    Asthma Maternal Grandmother    Hypertension Maternal Grandmother    Thyroid  disease Maternal Grandmother    Hypertension Maternal Grandfather    Pancreatic cancer Maternal Grandfather 9   Colon cancer Maternal Grandfather 60   Hypertension Paternal Grandmother    Diabetes Paternal Grandmother    Hypertension Paternal Grandfather    Diabetes Paternal Grandfather    Colon cancer Paternal Grandfather 32   Thyroid  disease Maternal Aunt    Breast cancer Maternal Aunt 65    Social History: I have reviewed social history from progress note on 02/02/2022. Social History   Socioeconomic History   Marital status: Married    Spouse name: Maylon Belfast   Number of children: 2   Years of education: Not on file   Highest education level: Associate degree: academic program  Occupational History   Occupation: Massage Therapy  Tobacco Use   Smoking status: Never    Passive exposure: Never   Smokeless tobacco: Never  Vaping Use   Vaping status: Never Used  Substance and Sexual Activity   Alcohol use: Never   Drug use: No   Sexual activity: Yes    Partners: Male    Birth control/protection: None  Other Topics Concern   Not on file  Social History Narrative   ** Merged History Encounter **       One story home Right-handed Caffeine: occasional coffee   Social Drivers of Corporate Investment Banker Strain: Medium Risk (01/10/2024)   Overall Financial Resource Strain (CARDIA)    Difficulty of Paying Living Expenses: Somewhat  hard  Food Insecurity: Food Insecurity Present (01/10/2024)   Hunger Vital Sign    Worried About Running Out of Food in the Last Year: Sometimes true    Ran Out of Food in the Last Year: Never true  Transportation Needs: No Transportation Needs (01/10/2024)   PRAPARE - Administrator, Civil Service (Medical): No    Lack of Transportation (Non-Medical): No  Physical Activity: Insufficiently Active (01/10/2024)   Exercise Vital Sign    Days of Exercise per Week: 4 days    Minutes of Exercise per Session: 30 min  Stress: Stress Concern Present (01/10/2024)   [employer] of Occupational Health - Occupational Stress Questionnaire    Feeling of Stress : To some extent  Social Connections: Unknown (01/10/2024)   Social Connection and Isolation Panel    Frequency of Communication with Friends and Family: Three times a week  Frequency of Social Gatherings with Friends and Family: Never    Attends Religious Services: Never    Database Administrator or Organizations: No    Attends Engineer, Structural: Not on file    Marital Status: Patient declined    Allergies:  Allergies  Allergen Reactions   Doxycycline  Other (See Comments)    Exacerbates Migraines   Imitrex  [Sumatriptan ] Swelling    Facial swelling and burning lips   Molds & Smuts    Pollen Extract-Tree Extract [Pollen Extract]    Shrimp Extract    Tylenol  [Acetaminophen ] Other (See Comments)    Causes rebound headaches    Penicillins Hives    Did it involve swelling of the face/tongue/throat, SOB, or low BP? No Did it involve sudden or severe rash/hives, skin peeling, or any reaction on the inside of your mouth or nose? Yes Did you need to seek medical attention at a hospital or doctor's office? Yes When did it last happen?      childhood allergy  If all above answers are "NO", may proceed with cephalosporin use.    Metabolic Disorder Labs: Lab Results  Component Value Date   HGBA1C 5.2 05/05/2024    No results found for: PROLACTIN Lab Results  Component Value Date   CHOL 127 05/05/2024   TRIG 62 05/05/2024   HDL 59 05/05/2024   CHOLHDL 2.2 05/05/2024   LDLCALC 55 05/05/2024   LDLCALC 69 06/28/2020   Lab Results  Component Value Date   TSH 1.540 05/05/2024   TSH 1.10 03/05/2024    Therapeutic Level Labs: No results found for: LITHIUM No results found for: VALPROATE No results found for: CBMZ  Current Medications: Current Outpatient Medications  Medication Sig Dispense Refill   busPIRone  (BUSPAR ) 10 MG tablet Take 2 tablets (20 mg total) by mouth 2 (two) times daily. 360 tablet 1   Clindamycin -Benzoyl Per, Refr, (DUAC) gel Apply to face qam, wash off qhs 45 g 3   escitalopram  (LEXAPRO ) 20 MG tablet TAKE 1 TABLET (20 MG TOTAL) BY MOUTH DAILY. DOSE INCREASE 90 tablet 1   hydrOXYzine  (VISTARIL ) 25 MG capsule TAKE 1-2 CAPSULES (25-50 MG TOTAL) BY MOUTH AT BEDTIME AS NEEDED. FOR ANXIETY AND SLEEP 180 capsule 0   levocetirizine (XYZAL ) 5 MG tablet Take 1 tablet (5 mg total) by mouth in the morning and at bedtime. 60 tablet 5   metFORMIN  (GLUCOPHAGE -XR) 500 MG 24 hr tablet Take 1 tablet (500 mg total) by mouth daily with lunch. 30 tablet 0   predniSONE  (STERAPRED UNI-PAK 21 TAB) 10 MG (21) TBPK tablet Take by mouth daily. Take 6 tabs by mouth daily  for 1 days, then 5 tabs for 1 days, then 4 tabs for 1 days, then 3 tabs for 1 days, 2 tabs for 1 days, then 1 tab by mouth daily for 1 days 21 tablet 0   tiZANidine  (ZANAFLEX ) 2 MG tablet Take 2 mg by mouth at bedtime.     topiramate  (TOPAMAX ) 25 MG tablet TAKE 3 TABLETS BY MOUTH AT BEDTIME. 270 tablet 1   traZODone  (DESYREL ) 50 MG tablet TAKE 1/2 TABLET BY MOUTH AT BEDTIME AS NEEDED FOR SLEEP 45 tablet 0   tretinoin  (RETIN-A ) 0.1 % cream Apply topically at bedtime. 45 g 3   valACYclovir  (VALTREX ) 1000 MG tablet Take 1 tablet (1,000 mg total) by mouth daily. 90 tablet 2   Vitamin D , Ergocalciferol , (DRISDOL ) 1.25 MG (50000  UNIT) CAPS capsule Take 1 capsule (50,000 Units total) by mouth  every 7 (seven) days. 4 capsule 1   Zavegepant HCl (ZAVZPRET ) 10 MG/ACT SOLN Place 1 spray into the nose daily as needed. Maximum 1 spray in 24 hours. 8 each 5   No current facility-administered medications for this visit.     Musculoskeletal: Strength & Muscle Tone: UTA Gait & Station: Seated Patient leans: N/A  Psychiatric Specialty Exam: Review of Systems  Psychiatric/Behavioral:  Positive for sleep disturbance. The patient is nervous/anxious.     Last menstrual period 10/06/2024.There is no height or weight on file to calculate BMI.  General Appearance: Casual  Eye Contact:  Fair  Speech:  Clear and Coherent  Volume:  Normal  Mood:  Anxious coping better  Affect:  Full Range  Thought Process:  Goal Directed and Descriptions of Associations: Intact  Orientation:  Full (Time, Place, and Person)  Thought Content: Logical   Suicidal Thoughts:  No  Homicidal Thoughts:  No  Memory:  Immediate;   Fair Recent;   Fair Remote;   Fair  Judgement:  Fair  Insight:  Fair  Psychomotor Activity:  Normal  Concentration:  Concentration: Fair and Attention Span: Fair  Recall:  [employer] of Knowledge: Fair  Language: Fair  Akathisia:  No  Handed:  Right  AIMS (if indicated): not done  Assets:  Communication Skills Desire for Improvement Housing Social Support  ADL's:  Intact  Cognition: WNL  Sleep:  Improving   Screenings: AIMS    Flowsheet Row Video Visit from 07/21/2022 in [facility]  AIMS Total Score 0   GAD-7    Flowsheet Row Counselor from 08/26/2024 in [facility] Office Visit from 07/07/2024 in [facility] Counselor from 03/11/2024 in [facility] Counselor from 11/06/2023 in [name] Office Visit from  10/08/2023 in [facility]  Total GAD-7 Score 3 5 2 16 13    PHQ2-9    Flowsheet Row Counselor from 08/26/2024 in [facility] Office Visit from 07/07/2024 in [facility] Office Visit from 05/05/2024 in [facility] from 03[facility]11/2024 in [facility] Counselor from 11/06/2023 in [facility]  PHQ-2 Total Score 0 0 1 0 2  PHQ-9 Total Score -- -- 7 1 9    Flowsheet Row Video Visit from 10/22/2024 in [facility] Video Visit from 10/06/2024 in [facility] Counselor from 08/26/2024 in [facility]  C-SSRS RISK CATEGORY No Risk Low Risk No Risk     Assessment and Plan: JESSICA CHECKETTS is a 28 year old female, married, presented for a follow-up appointment, discussed assessment and plan as noted below.  1. GAD (generalized anxiety disorder)-improving Currently reports overall improvement in her anxiety symptoms.  Continues to be motivated to work with psychotherapist. Continue Lexapro  20 mg daily Continue BuSpar  20 mg twice a day Continue CBT with Ms. Evalene Husband  2. MDD (major depressive disorder), recurrent, in full remission Currently denies any depression symptoms.  Does have sleep problems although she attributes that to the recent daylight saving changes.  Overall sleep is better. Continue Lexapro  and BuSpar  as prescribed   3. PTSD (post-traumatic stress disorder)-improving Reports improvement in intrusive memories and flashbacks. Continue psychotherapy sessions with Ms. Evalene Husband, currently incorporating  EMDR.  4. Insomnia due to medical condition-improving Overall sleep has improved. Continue Trazodone  25 mg at  bedtime Continue Hydroxyzine  25 to 50 mg at bedtime as needed  Follow-up Follow-up in clinic in 2 months or sooner if needed.    Collaboration of Care: Collaboration of Care: Referral or follow-up with counselor/therapist AEB patient encouraged to continue psychotherapy sessions I have reviewed notes per Ms. Evalene Husband dated 10/14/2024 - continue EMDR by processing and memory on patient's target sequence plan.  Patient/Guardian was advised Release of Information must be obtained prior to any record release in order to collaborate their care with an outside provider. Patient/Guardian was advised if they have not already done so to contact the registration department to sign all necessary forms in order for us  to release information regarding their care.   Consent: Patient/Guardian gives verbal consent for treatment and assignment of benefits for services provided during this visit. Patient/Guardian expressed understanding and agreed to proceed.   This note was generated in part or whole with voice recognition software. Voice recognition is usually quite accurate but there are transcription errors that can and very often do occur. I apologize for any typographical errors that were not detected and corrected.    Deija Buhrman, MD 10/22/2024, 4:22 PM

## 2024-10-28 ENCOUNTER — Ambulatory Visit (INDEPENDENT_AMBULATORY_CARE_PROVIDER_SITE_OTHER): Admitting: Licensed Clinical Social Worker

## 2024-10-28 DIAGNOSIS — F411 Generalized anxiety disorder: Secondary | ICD-10-CM | POA: Diagnosis not present

## 2024-10-28 DIAGNOSIS — F4310 Post-traumatic stress disorder, unspecified: Secondary | ICD-10-CM

## 2024-10-28 DIAGNOSIS — F3342 Major depressive disorder, recurrent, in full remission: Secondary | ICD-10-CM
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
## 2024-11-10 ENCOUNTER — Ambulatory Visit: Admitting: Licensed Clinical Social Worker

## 2024-11-18 ENCOUNTER — Ambulatory Visit

## 2024-11-20 ENCOUNTER — Encounter (INDEPENDENT_AMBULATORY_CARE_PROVIDER_SITE_OTHER): Payer: Self-pay | Admitting: Family Medicine

## 2024-11-20 ENCOUNTER — Ambulatory Visit (INDEPENDENT_AMBULATORY_CARE_PROVIDER_SITE_OTHER): Admitting: Family Medicine

## 2024-11-20 ENCOUNTER — Ambulatory Visit: Admitting: Licensed Clinical Social Worker

## 2024-11-20 VITALS — BP 110/71 | HR 82 | Temp 98.0°F | Ht 63.0 in | Wt 178.0 lb

## 2024-11-20 DIAGNOSIS — F4310 Post-traumatic stress disorder, unspecified: Secondary | ICD-10-CM | POA: Diagnosis not present

## 2024-11-20 DIAGNOSIS — F411 Generalized anxiety disorder: Secondary | ICD-10-CM

## 2024-11-20 DIAGNOSIS — F3342 Major depressive disorder, recurrent, in full remission: Secondary | ICD-10-CM

## 2024-11-20 DIAGNOSIS — Z6829 Body mass index (BMI) 29.0-29.9, adult: Secondary | ICD-10-CM

## 2024-11-20 DIAGNOSIS — Z6830 Body mass index (BMI) 30.0-30.9, adult: Secondary | ICD-10-CM

## 2024-11-20 DIAGNOSIS — E88819 Insulin resistance, unspecified: Secondary | ICD-10-CM

## 2024-11-20 DIAGNOSIS — E559 Vitamin D deficiency, unspecified: Secondary | ICD-10-CM

## 2024-11-20 MED ORDER — VITAMIN D (ERGOCALCIFEROL) 1.25 MG (50000 UNIT) PO CAPS: 50000.0000 [IU] | ORAL_CAPSULE | ORAL | 1 refills | Status: AC

## 2024-11-20 MED ORDER — TOPIRAMATE 25 MG PO TABS: 25.0000 mg | ORAL_TABLET | Freq: Every day | ORAL | 0 refills | Status: DC

## 2024-11-20 MED ORDER — METFORMIN HCL ER 500 MG PO TB24: 500.0000 mg | ORAL_TABLET | Freq: Every day | ORAL | 0 refills | Status: DC
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
## 2024-11-20 NOTE — Progress Notes (Signed)
 Amanda [name], D.O.  ABFM, ABOM Specializing in Clinical Bariatric Medicine  Office located at: [address] [place], [place]  [address]    Review labs at next OV.   A) FOR THE CHRONIC DISEASE OF OBESITY:  Chief complaint: Obesity Amanda [name] is here to discuss her progress with her obesity treatment plan.   History of present illness / Interval history:  Amanda [name] is here today for her follow-up office visit.  Since last OV on 10/16/24, pt is up 3 lbs. Patient states that she thought she was doing well and during Thanksgiving she even worked on portion control and didn't eat excessively. She also endorses feeling like maybe she is not journaling accurately.      10/16/24 10:00 11/20/24 08:00   Body Fat % 37.3 % 38.6 %  Muscle Mass (lbs) 104.6 lbs 104 lbs  Fat Mass (lbs) 65.4 lbs 69 lbs  Total Body Water (lbs) 74 lbs 76.6 lbs  Visceral Fat Rating  6 6   Counseling done on how various foods will affect these numbers and how to maximize success.   Total lbs lost to date: + 10 lbs Total Fat Mass in lbs lost to date: + 7.6 lbs Total weight loss percentage to date: + 5.95 %    Starting BMI 29.0-29.9,adult start BMI 29.76 BMI 30.0-30.9,adult -- Curent BMI 31.54  Nutrition Therapy She is journaling 1200-1300 calories with 90+ g protein and states she is following her eating plan approximately 90 % of the time.   - Tracking Calories/Macros: yes  - Eating More Whole Foods: yes  - Adequate Protein Intake: no - sometimes  - Adequate Water Intake: yes  - Skipping Meals: no   - Sleeping 7-9 Hours/ Night: yes   Amanda [name] is currently in the action stage of change. As such, her goal is to continue weight management plan.  She has agreed to: continue current plan   Physical Activity Amanda [name] is doing burn boot camp 45  minutes 4 to 5 days per week   Amanda [name] has been advised to work up to 300-450 minutes of moderate intensity aerobic activity a week and strengthening  exercises 2-3 times per week for cardiovascular health, weight loss maintenance and preservation of muscle mass.  She has agreed to : Increase volume of physical activity to a goal of 240 minutes a week and Combine aerobic and strengthening exercises for efficiency and improved cardiometabolic health.   Behavioral Modifications Evidence-based interventions for health behavior change were utilized today including the discussion of  1) self monitoring techniques:    - Weigh proteins and measure veggies   2) SMART goals for next OV:    - Accurately journal all foods  Regarding patient's less desirable eating habits and patterns, we employed the technique of small changes.   We discussed the following today: increasing lean protein intake to established goals and work on tracking and journaling calories using tracking application Additional resources provided today: Handout on Daily Food Journaling Log   Medical Interventions/ Pharmacotherapy Previous Bariatric surgery: n/a Pharmacotherapy for weight loss: She is currently taking Metformin  XR 1 tab daily  for medical weight loss.    We discussed various medication options to help Amanda [name] with her weight loss efforts and we both agreed to : Continue with current nutritional and behavioral strategies and Adequate clinical response to anti-obesity medication, continue current regimen   B) OBESITY RELATED CONDITIONS ADDRESSED TODAY:  Insulin  resistance Assessment & Plan Lab Results  Component  Value Date   HGBA1C 5.2 05/05/2024   HGBA1C 5.3 01/07/2015   INSULIN  5.8 05/05/2024    On Metformin  XR 500 mg once daily. With reported good compliance and tolerance.She mentions that she did decrease to half a pill but increased back to a whole pill daily.  Patient states that she feels like it has curved her cravings some but towards the evenings she feels her cravings increase. She states that she averages 1300-1400 calories with 73.5 g of protein.  Since patient feels an increase in hunger mutually agreed to Begin Topiramate  1 tablet daily. Patient states that she was previously on Topiramate  but is not longer taking it. Her last dose was about 6 months ago. Will reassess at next OV. Continue following prudent meal plan and decreasing simple carbs.      Vitamin D  deficiency Assessment & Plan Lab Results  Component Value Date   VD25OH 33.6 05/05/2024   VD25OH 27.3 (L) 06/28/2020   Taking ERGO 50K units once weekly. With reported good compliance and tolerance. She denies having any issues taking her supplementation. Will refill today. Continue with supplementation will obtain labs as medically necessary.     Medications Discontinued During This Encounter  Medication Reason   topiramate  (TOPAMAX ) 25 MG tablet Reorder   metFORMIN  (GLUCOPHAGE -XR) 500 MG 24 hr tablet Reorder   Vitamin D , Ergocalciferol , (DRISDOL ) 1.25 MG (50000 UNIT) CAPS capsule Reorder     Meds ordered this encounter  Medications   Vitamin D , Ergocalciferol , (DRISDOL ) 1.25 MG (50000 UNIT) CAPS capsule    Sig: Take 1 capsule (50,000 Units total) by mouth every 7 (seven) days.    Dispense:  4 capsule    Refill:  1   metFORMIN  (GLUCOPHAGE -XR) 500 MG 24 hr tablet    Sig: Take 1 tablet (500 mg total) by mouth daily with lunch.    Dispense:  30 tablet    Refill:  0   topiramate  (TOPAMAX ) 25 MG tablet    Sig: Take 1 tablet (25 mg total) by mouth at bedtime.    Dispense:  30 tablet    Refill:  0     Follow up:   Return 12/25/2024 at 2:40 PM  She was informed of the importance of frequent follow up visits to maximize her success with intensive lifestyle modifications for her multiple health conditions.   Weight Summary and Biometrics   Weight Lost Since Last Visit: 0lb  Weight Gained Since Last Visit: 3lb   Vitals Temp: 98 F (36.7 C) BP: 110/71 Pulse Rate: 82 SpO2: 98 %   Anthropometric Measurements Height: 5' 3 (1.6 m) Weight: 178 lb (80.7  kg) BMI (Calculated): 31.54 Weight at Last Visit: 175lb Weight Lost Since Last Visit: 0lb Weight Gained Since Last Visit: 3lb Starting Weight: 168lb Total Weight Loss (lbs): 0 lb (0 kg) Peak Weight: 180lb   Body Composition  Body Fat %: 38.6 % Fat Mass (lbs): 69 lbs Muscle Mass (lbs): 104 lbs Total Body Water (lbs): 76.6 lbs Visceral Fat Rating : 6   Other Clinical Data Fasting: yes Labs: yes Today's Visit #: 10 Starting Date: 05/05/24    Objective:   PHYSICAL EXAM: Blood pressure 110/71, pulse 82, temperature 98 F (36.7 C), height 5' 3 (1.6 m), weight 178 lb (80.7 kg), SpO2 98%. Body mass index is 31.53 kg/m.  General: she is overweight, cooperative and in no acute distress. PSYCH: Has normal mood, affect and thought process.   HEENT: EOMI, sclerae are anicteric. Lungs: Normal breathing effort,  no conversational dyspnea. Extremities: Moves * 4 Neurologic: A and O * 3, good insight  DIAGNOSTIC DATA REVIEWED: BMET    Component Value Date/Time   NA 139 05/05/2024 1013   K 4.3 05/05/2024 1013   CL 101 05/05/2024 1013   CO2 22 05/05/2024 1013   GLUCOSE 90 05/05/2024 1013   GLUCOSE 82 03/14/2024 1626   BUN 12 05/05/2024 1013   CREATININE 0.74 05/05/2024 1013   CREATININE 0.79 03/14/2024 1626   CALCIUM 9.7 05/05/2024 1013   GFRNONAA >60 08/18/2023 1431   GFRAA 131 06/28/2020 0930   Lab Results  Component Value Date   HGBA1C 5.2 05/05/2024   HGBA1C 5.3 01/07/2015   Lab Results  Component Value Date   INSULIN  5.8 05/05/2024   Lab Results  Component Value Date   TSH 1.540 05/05/2024   CBC    Component Value Date/Time   WBC 11.8 (H) 05/05/2024 1013   WBC 10.2 08/18/2023 1431   RBC 4.92 05/05/2024 1013   RBC 4.15 08/18/2023 1431   HGB 14.4 05/05/2024 1013   HCT 44.1 05/05/2024 1013   PLT 249 05/05/2024 1013   MCV 90 05/05/2024 1013   MCH 29.3 05/05/2024 1013   MCH 29.9 08/18/2023 1431   MCHC 32.7 05/05/2024 1013   MCHC 33.9 08/18/2023 1431    RDW 11.9 05/05/2024 1013   Iron Studies No results found for: IRON, TIBC, FERRITIN, IRONPCTSAT Lipid Panel     Component Value Date/Time   CHOL 127 05/05/2024 1013   TRIG 62 05/05/2024 1013   HDL 59 05/05/2024 1013   CHOLHDL 2.2 05/05/2024 1013   LDLCALC 55 05/05/2024 1013   Hepatic Function Panel     Component Value Date/Time   PROT 7.6 05/05/2024 1013   ALBUMIN 4.8 05/05/2024 1013   AST 17 05/05/2024 1013   ALT 6 05/05/2024 1013   ALKPHOS 79 05/05/2024 1013   BILITOT 0.4 05/05/2024 1013   BILIDIR 0.2 08/18/2023 1431   IBILI 0.8 08/18/2023 1431      Component Value Date/Time   TSH 1.540 05/05/2024 1013   Nutritional Lab Results  Component Value Date   VD25OH 33.6 05/05/2024   VD25OH 27.3 (L) 06/28/2020    Attestations:   LILLETTE Sonny Laroche, acting as a stage manager for Amanda Jenkins, DO., have compiled all relevant documentation for today's office visit on behalf of Amanda Jenkins, DO, while in the presence of [employer], DO.   I have reviewed the above documentation for accuracy and completeness, and I agree with the above. Amanda JINNY [name], D.O.  The 21st Century Cures Act was signed into law in 2016 which includes the topic of electronic health records.  This provides immediate access to information in MyChart.  This includes consultation notes, operative notes, office notes, lab results and pathology reports.  If you have any questions about what you read please let us  know at your next visit so we can discuss your concerns and take corrective action if need be.  We are right here with you.

## 2024-11-22 LAB — MAGNESIUM: Magnesium: 2.1 mg/dL (ref 1.6–2.3)

## 2024-11-22 LAB — BASIC METABOLIC PANEL WITH GFR
BUN/Creatinine Ratio: 16 (ref 9–23)
BUN: 12 mg/dL (ref 6–20)
CO2: 22 mmol/L (ref 20–29)
Calcium: 9.1 mg/dL (ref 8.7–10.2)
Chloride: 104 mmol/L (ref 96–106)
Creatinine, Ser: 0.75 mg/dL (ref 0.57–1.00)
Glucose: 90 mg/dL (ref 70–99)
Potassium: 4.5 mmol/L (ref 3.5–5.2)
Sodium: 138 mmol/L (ref 134–144)
eGFR: 113 mL/min/1.73 (ref >59)

## 2024-11-22 LAB — VITAMIN B12: Vitamin B-12: 647 pg/mL (ref 232–1245)

## 2024-11-22 LAB — INSULIN, RANDOM: INSULIN: 7.7 u[IU]/mL (ref 2.6–24.9)

## 2024-11-22 LAB — HEMOGLOBIN A1C
Est. average glucose Bld gHb Est-mCnc: 100 mg/dL
Hgb A1c MFr Bld: 5.1 % (ref 4.8–5.6)

## 2024-11-22 LAB — VITAMIN D 25 HYDROXY (VIT D DEFICIENCY, FRACTURES): Vit D, 25-Hydroxy: 39.4 ng/mL (ref 30.0–100.0)

## 2024-11-27 ENCOUNTER — Encounter: Payer: Self-pay | Admitting: Certified Nurse Midwife

## 2024-11-27 ENCOUNTER — Other Ambulatory Visit (HOSPITAL_COMMUNITY)
Admission: RE | Admit: 2024-11-27 | Discharge: 2024-11-27 | Disposition: A | Discharge status: Home / Self Care | Source: Ambulatory Visit | Attending: Certified Nurse Midwife | Admitting: Certified Nurse Midwife

## 2024-11-27 ENCOUNTER — Ambulatory Visit: Admitting: Certified Nurse Midwife

## 2024-11-27 VITALS — BP 132/60 | HR 89 | Ht 63.0 in | Wt 184.1 lb

## 2024-11-27 DIAGNOSIS — Z23 Encounter for immunization: Secondary | ICD-10-CM | POA: Diagnosis not present

## 2024-11-27 DIAGNOSIS — Z124 Encounter for screening for malignant neoplasm of cervix: Secondary | ICD-10-CM | POA: Insufficient documentation

## 2024-11-27 DIAGNOSIS — A6000 Herpesviral infection of urogenital system, unspecified: Secondary | ICD-10-CM

## 2024-11-27 DIAGNOSIS — Z01411 Encounter for gynecological examination (general) (routine) with abnormal findings: Secondary | ICD-10-CM | POA: Diagnosis not present

## 2024-11-27 DIAGNOSIS — Z01419 Encounter for gynecological examination (general) (routine) without abnormal findings: Secondary | ICD-10-CM

## 2024-11-27 MED ORDER — VALACYCLOVIR HCL 1 G PO TABS: 1000.0000 mg | ORAL_TABLET | Freq: Every day | ORAL | 2 refills | Status: AC

## 2024-11-27 NOTE — Patient Instructions (Addendum)
Preventive Care 74-26 Years Old, Female Preventive care refers to lifestyle choices and visits with your health care provider that can promote health and wellness. Preventive care visits are also called wellness exams. What can I expect for my preventive care visit? Counseling During your preventive care visit, your health care provider may ask about your: Medical history, including: Past medical problems. Family medical history. Pregnancy history. Current health, including: Menstrual cycle. Method of birth control. Emotional well-being. Home life and relationship well-being. Sexual activity and sexual health. Lifestyle, including: Alcohol, nicotine or tobacco, and drug use. Access to firearms. Diet, exercise, and sleep habits. Work and work Astronomer. Sunscreen use. Safety issues such as seatbelt and bike helmet use. Physical exam Your health care provider may check your: Height and weight. These may be used to calculate your BMI (body mass index). BMI is a measurement that tells if you are at a healthy weight. Waist circumference. This measures the distance around your waistline. This measurement also tells if you are at a healthy weight and may help predict your risk of certain diseases, such as type 2 diabetes and high blood pressure. Heart rate and blood pressure. Body temperature. Skin for abnormal spots. What immunizations do I need?  Vaccines are usually given at various ages, according to a schedule. Your health care provider will recommend vaccines for you based on your age, medical history, and lifestyle or other factors, such as travel or where you work. What tests do I need? Screening Your health care provider may recommend screening tests for certain conditions. This may include: Pelvic exam and Pap test. Lipid and cholesterol levels. Diabetes screening. This is done by checking your blood sugar (glucose) after you have not eaten for a while (fasting). Hepatitis  B test. Hepatitis C test. HIV (human immunodeficiency virus) test. STI (sexually transmitted infection) testing, if you are at risk. BRCA-related cancer screening. This may be done if you have a family history of breast, ovarian, tubal, or peritoneal cancers. Talk with your health care provider about your test results, treatment options, and if necessary, the need for more tests. Follow these instructions at home: Eating and drinking  Eat a healthy diet that includes fresh fruits and vegetables, whole grains, lean protein, and low-fat dairy products. Take vitamin and mineral supplements as recommended by your health care provider. Do not drink alcohol if: Your health care provider tells you not to drink. You are pregnant, may be pregnant, or are planning to become pregnant. If you drink alcohol: Limit how much you have to 0-1 drink a day. Know how much alcohol is in your drink. In the [place] one drink equals one 12 oz bottle of beer (355 mL), one 5 oz glass of wine (148 mL), or one 1 oz glass of hard liquor (44 mL). Lifestyle Brush your teeth every morning and night with fluoride toothpaste. Floss one time each day. Exercise for at least 30 minutes 5 or more days each week. Do not use any products that contain nicotine or tobacco. These products include cigarettes, chewing tobacco, and vaping devices, such as e-cigarettes. If you need help quitting, ask your health care provider. Do not use drugs. If you are sexually active, practice safe sex. Use a condom or other form of protection to prevent STIs. If you do not wish to become pregnant, use a form of birth control. If you plan to become pregnant, see your health care provider for a prepregnancy visit. Find healthy ways to manage stress, such as: Meditation,  yoga, or listening to music. Journaling. Talking to a trusted person. Spending time with friends and family. Minimize exposure to UV radiation to reduce your risk of skin  cancer. Safety Always wear your seat belt while driving or riding in a vehicle. Do not drive: If you have been drinking alcohol. Do not ride with someone who has been drinking. If you have been using any mind-altering substances or drugs. While texting. When you are tired or distracted. Wear a helmet and other protective equipment during sports activities. If you have firearms in your house, make sure you follow all gun safety procedures. Seek help if you have been physically or sexually abused. What's next? Go to your health care provider once a year for an annual wellness visit. Ask your health care provider how often you should have your eyes and teeth checked. Stay up to date on all vaccines. This information is not intended to replace advice given to you by your health care provider. Make sure you discuss any questions you have with your health care provider. Document Revised: 06/01/2021 Document Reviewed: 06/01/2021 Elsevier Patient Education  2024 [employer]  Health Maintenance, Female Adopting a healthy lifestyle and getting preventive care are important in promoting health and wellness. Ask your health care provider about: The right schedule for you to have regular tests and exams. Things you can do on your own to prevent diseases and keep yourself healthy. What should I know about diet, weight, and exercise? Eat a healthy diet  Eat a diet that includes plenty of vegetables, fruits, low-fat dairy products, and lean protein. Do not eat a lot of foods that are high in solid fats, added sugars, or sodium. Maintain a healthy weight Body mass index (BMI) is used to identify weight problems. It estimates body fat based on height and weight. Your health care provider can help determine your BMI and help you achieve or maintain a healthy weight. Get regular exercise Get regular exercise. This is one of the most important things you can do for your health. Most adults  should: Exercise for at least 150 minutes each week. The exercise should increase your heart rate and make you sweat (moderate-intensity exercise). Do strengthening exercises at least twice a week. This is in addition to the moderate-intensity exercise. Spend less time sitting. Even light physical activity can be beneficial. Watch cholesterol and blood lipids Have your blood tested for lipids and cholesterol at 26 years of age, then have this test every 5 years. Have your cholesterol levels checked more often if: Your lipid or cholesterol levels are high. You are older than 26 years of age. You are at high risk for heart disease. What should I know about cancer screening? Depending on your health history and family history, you may need to have cancer screening at various ages. This may include screening for: Breast cancer. Cervical cancer. Colorectal cancer. Skin cancer. Lung cancer. What should I know about heart disease, diabetes, and high blood pressure? Blood pressure and heart disease High blood pressure causes heart disease and increases the risk of stroke. This is more likely to develop in people who have high blood pressure readings or are overweight. Have your blood pressure checked: Every 3-5 years if you are 61-26 years of age. Every year if you are 60 years old or older. Diabetes Have regular diabetes screenings. This checks your fasting blood sugar level. Have the screening done: Once every three years after age 86 if you are at a normal weight  and have a low risk for diabetes. More often and at a younger age if you are overweight or have a high risk for diabetes. What should I know about preventing infection? Hepatitis B If you have a higher risk for hepatitis B, you should be screened for this virus. Talk with your health care provider to find out if you are at risk for hepatitis B infection. Hepatitis C Testing is recommended for: Everyone born from [birth year] through  1965. Anyone with known risk factors for hepatitis C. Sexually transmitted infections (STIs) Get screened for STIs, including gonorrhea and chlamydia, if: You are sexually active and are younger than 26 years of age. You are older than 26 years of age and your health care provider tells you that you are at risk for this type of infection. Your sexual activity has changed since you were last screened, and you are at increased risk for chlamydia or gonorrhea. Ask your health care provider if you are at risk. Ask your health care provider about whether you are at high risk for HIV. Your health care provider may recommend a prescription medicine to help prevent HIV infection. If you choose to take medicine to prevent HIV, you should first get tested for HIV. You should then be tested every 3 months for as long as you are taking the medicine. Pregnancy If you are about to stop having your period (premenopausal) and you may become pregnant, seek counseling before you get pregnant. Take 400 to 800 micrograms (mcg) of folic acid every day if you become pregnant. Ask for birth control (contraception) if you want to prevent pregnancy. Osteoporosis and menopause Osteoporosis is a disease in which the bones lose minerals and strength with aging. This can result in bone fractures. If you are 56 years old or older, or if you are at risk for osteoporosis and fractures, ask your health care provider if you should: Be screened for bone loss. Take a calcium or vitamin D supplement to lower your risk of fractures. Be given hormone replacement therapy (HRT) to treat symptoms of menopause. Follow these instructions at home: Alcohol use Do not drink alcohol if: Your health care provider tells you not to drink. You are pregnant, may be pregnant, or are planning to become pregnant. If you drink alcohol: Limit how much you have to: 0-1 drink a day. Know how much alcohol is in your drink. In the [place] one drink  equals one 12 oz bottle of beer (355 mL), one 5 oz glass of wine (148 mL), or one 1 oz glass of hard liquor (44 mL). Lifestyle Do not use any products that contain nicotine or tobacco. These products include cigarettes, chewing tobacco, and vaping devices, such as e-cigarettes. If you need help quitting, ask your health care provider. Do not use street drugs. Do not share needles. Ask your health care provider for help if you need support or information about quitting drugs. General instructions Schedule regular health, dental, and eye exams. Stay current with your vaccines. Tell your health care provider if: You often feel depressed. You have ever been abused or do not feel safe at home. Summary Adopting a healthy lifestyle and getting preventive care are important in promoting health and wellness. Follow your health care provider's instructions about healthy diet, exercising, and getting tested or screened for diseases. Follow your health care provider's instructions on monitoring your cholesterol and blood pressure. This information is not intended to replace advice given to you by your health care provider.  Make sure you discuss any questions you have with your health care provider. Document Revised: 04/25/2021 Document Reviewed: 04/25/2021 Elsevier Patient Education  2024 [employer].

## 2024-11-27 NOTE — Progress Notes (Signed)
 ANNUAL EXAM Patient name: Amanda [name] MRN [id number]  Date of birth: 08/16/1996 Chief Complaint:   Annual Exam  History of Present Illness:   Amanda [name] is a 28 y.o. G33P0102 Caucasian female being seen today for a routine annual exam.  Current complaints: none, annual labs completed with PCP. Partner vasectomy for contraception. Follows with psychiatry for management of mood. Hx HSV, needs refill for valtrex  for outbreaks.  Patient's last menstrual period was 11/02/2024 (exact date).   Period Cycle (Days): 28 Period Duration (Days): 1-5 Period Pattern: Regular Menstrual Flow: Moderate Menstrual Control: Tampon Menstrual Control Change Freq (Hours): 2-4 Dysmenorrhea: (!) Moderate Dysmenorrhea Symptoms: Cramping   Upstream - 11/27/24 1344       Pregnancy Intention Screening   Does the patient want to become pregnant in the next year? No    Does the patient's partner want to become pregnant in the next year? No    Would the patient like to discuss contraceptive options today? No      Contraception Wrap Up   Current Method Vasectomy    End Method Vasectomy    Contraception Counseling Provided No    How was the end contraceptive method provided? N/A         The pregnancy intention screening data noted above was reviewed. Potential methods of contraception were discussed. The patient elected to proceed with Vasectomy.      Component Value Date/Time   DIAGPAP  02/10/2022 1100    - Negative for intraepithelial lesion or malignancy (NILM)   DIAGPAP (A) 08/16/2021 1015    - Atypical squamous cells of undetermined significance (ASC-US )   DIAGPAP  02/14/2021 0853    - Negative for intraepithelial lesion or malignancy (NILM)   ADEQPAP  02/10/2022 1100    Satisfactory for evaluation; transformation zone component PRESENT.   ADEQPAP  08/16/2021 1015    Satisfactory for evaluation; transformation zone component PRESENT.   ADEQPAP  02/14/2021 0853    Satisfactory for  evaluation; transformation zone component PRESENT.      Last pap 02/10/22. Results were: negative. H/O abnormal pap: no Last mammogram: N/A. Results were: N/A. Family h/o breast cancer: no Last colonoscopy: N/A. Results were: N/A. Family h/o colorectal cancer: no     08/26/2024    8:05 AM 07/07/2024    9:33 AM 05/05/2024   11:03 AM 03/11/2024    3:49 PM 11/06/2023   10:04 AM  Depression screen PHQ 2/9  Decreased Interest   0    Down, Depressed, Hopeless   1    PHQ - 2 Score   1    Altered sleeping   1    Tired, decreased energy   1    Change in appetite   2    Feeling bad or failure about yourself    2    Trouble concentrating   0    Moving slowly or fidgety/restless   0    Suicidal thoughts   0    PHQ-9 Score   7     Difficult doing work/chores   Not difficult at all       Information is confidential and restricted. Go to Review Flowsheets to unlock data.   Data saved with a previous flowsheet row definition        08/26/2024    8:06 AM 07/07/2024    9:33 AM 03/11/2024    3:50 PM 11/06/2023   10:05 AM  GAD 7 : Generalized Anxiety Score  Nervous, Anxious,  on Edge      Control/stop worrying      Worry too much - different things      Trouble relaxing      Restless      Easily annoyed or irritable      Afraid - awful might happen      Total GAD 7 Score      Anxiety Difficulty         Information is confidential and restricted. Go to Review Flowsheets to unlock data.      Past Medical History:  Diagnosis Date   Acne    Angio-edema    Anxiety    Back pain    BRCA negative 09/2020   MyRisk neg except AXIN2 VUS   Family history of adverse reaction to anesthesia    heart condition   Family history of breast cancer 09/2020   IBIS=15.7%/riskscore=14.7%   Family history of pancreatic cancer    Family history of thyroid  disease    GERD (gastroesophageal reflux disease)    Lactose intolerance    Migraine    Multiple food allergies    Urticaria     Family History   Problem Relation Age of Onset   Migraines Mother    Depression Mother    Anxiety disorder Mother    Allergic rhinitis Father    Skin cancer Father    Allergic rhinitis Brother    Allergies Brother    Asthma Maternal Grandmother    Hypertension Maternal Grandmother    Thyroid  disease Maternal Grandmother    Hypertension Maternal Grandfather    Pancreatic cancer Maternal Grandfather 22   Colon cancer Maternal Grandfather 60   Hypertension Paternal Grandmother    Diabetes Paternal Grandmother    Hypertension Paternal Grandfather    Diabetes Paternal Grandfather    Colon cancer Paternal Grandfather 76   Thyroid  disease Maternal Aunt    Breast cancer Maternal Aunt 45   Review of Systems:   Pertinent items are noted in HPI Denies any headaches, blurred vision, fatigue, shortness of breath, chest pain, abdominal pain, abnormal vaginal discharge/itching/odor/irritation, problems with periods, bowel movements, urination, or intercourse unless otherwise stated above. Pertinent History Reviewed:  Reviewed past medical,surgical, social and family history.  Reviewed problem list, medications and allergies. Physical Assessment:   Vitals:   11/27/24 1346  BP: 132/60  Pulse: 89  Weight: 184 lb 1.6 oz (83.5 kg)  Height: 5' 3 (1.6 m)  Body mass index is 32.61 kg/m.       Physical Exam Vitals reviewed.  Constitutional:      General: She is not in acute distress.    Appearance: Normal appearance.  HENT:     Head: Normocephalic.  Neck:     Thyroid : No thyroid  mass or thyromegaly.  Cardiovascular:     Rate and Rhythm: Normal rate and regular rhythm.     Heart sounds: Normal heart sounds.  Pulmonary:     Effort: Pulmonary effort is normal.     Breath sounds: Normal breath sounds.  Chest:  Breasts:    Tanner Score is 5.     Right: Normal.     Left: Normal.  Abdominal:     General: Abdomen is flat.     Palpations: Abdomen is soft.     Tenderness: There is no abdominal  tenderness.  Genitourinary:    General: Normal vulva.     Tanner stage (genital): 5.     Vagina: Normal.     Cervix: Normal.  Uterus: Not enlarged and not tender.      Adnexa:        Right: No mass or tenderness.         Left: No mass or tenderness.    Musculoskeletal:     Cervical back: Neck supple. No tenderness.  Lymphadenopathy:     Upper Body:     Right upper body: No axillary adenopathy.     Left upper body: No axillary adenopathy.  Skin:    General: Skin is warm and dry.  Neurological:     General: No focal deficit present.     Mental Status: She is alert and oriented to person, place, and time.  Psychiatric:        Mood and Affect: Mood normal.        Behavior: Behavior normal.      No results found for this or any previous visit (from the past 24 hours).  Assessment & Plan:  1. Well woman exam with routine gynecological exam (Primary)  2. Encounter for immunization - Flu vaccine trivalent PF, 6mos and older(Flulaval,Afluria,Fluarix,Fluzone)  3. Genital herpes simplex type 2 - valACYclovir  (VALTREX ) 1000 MG tablet; Take 1 tablet (1,000 mg total) by mouth daily.  Dispense: 90 tablet; Refill: 2  4. 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  Orders Placed This Encounter  Procedures   Flu vaccine trivalent PF, 6mos and older(Flulaval,Afluria,Fluarix,Fluzone)    Meds:  Meds ordered this encounter  Medications   valACYclovir  (VALTREX ) 1000 MG tablet    Sig: Take 1 tablet (1,000 mg total) by mouth daily.    Dispense:  90 tablet    Refill:  2    Follow-up: No follow-ups on file.  Harlene LITTIE Cisco, CNM 11/27/2024 5:33 PM

## 2024-12-04 ENCOUNTER — Ambulatory Visit: Admitting: Licensed Clinical Social Worker

## 2024-12-04 DIAGNOSIS — F411 Generalized anxiety disorder: Secondary | ICD-10-CM | POA: Diagnosis not present

## 2024-12-04 DIAGNOSIS — F4310 Post-traumatic stress disorder, unspecified: Secondary | ICD-10-CM | POA: Diagnosis not present

## 2024-12-04 DIAGNOSIS — F3342 Major depressive disorder, recurrent, in full remission: Secondary | ICD-10-CM

## 2024-12-04 LAB — CYTOLOGY - PAP: Diagnosis: NEGATIVE

## 2024-12-04 NOTE — Progress Notes (Signed)
 THERAPIST PROGRESS NOTE  Session Time: 1:02pm-2pm  Participation Level: Active  Behavioral Response: CasualAlertAnxious and Euthymic  Type of Therapy: Individual Therapy  Treatment Goals addressed:  Active     Anxiety     LTG: Amanda [name] will score less than 5 on the Generalized Anxiety Disorder 7 Scale (GAD-7)  (Progressing)     Start:  11/06/23    Expected End:  02/18/25       Goal Note     11/20/24: Patient reports she is aware of new triggers following a recent panic attack. Patient reports she was able to remove herself and regulate through breathing techniques.           STG: Amanda [name] will practice problem solving skills 3 times per week for the next 4 weeks.  (Progressing)     Start:  11/06/23    Expected End:  02/18/25         Work with Amanda [name] to track symptoms, triggers, and/or skill use through a mood chart, diary card, or journal     Start:  11/06/23         Perform psychoeducation regarding anxiety disorders     Start:  11/06/23         Coping Skills      Start:  11/06/23       Will work with the pt using CBT/DBT techniques to help the pt verbalize an understanding of the cognitive, physiological, and behavioral components of anxiety and its treatment. This will be done by using worksheets, interactive activities, CBT/ABC thought logs, modeling, homework, role playing and journaling. Will work with pt to learn and implement coping skills that result in a reduction of anxiety and improve daily functioning per pt self report 3 out of 5 documented sessions.         BH CCP Acute or Chronic Trauma Reaction     LTG: Recall traumatic events without becoming overwhelmed with negative emotions (Progressing)     Start:  11/06/23    Expected End:  02/18/25         STG: Amanda [name] will practice emotion regulation skills 2 time(s) per week for the next 8 week(s) (Progressing)     Start:  11/06/23    Expected End:  02/18/25       Goal Note     11/20/24: Patient  reports Improvement in her ability to refrain from engaging in unhealthy coping mechanisms due to emotional dysregulation. Reports she has utilized assertive communication and coping skills.         LTG: Pt reports she would like to Work through all the trauma I experienced through the breakup and with the other person (Progressing)     Start:  11/06/23    Expected End:  02/18/25         STG: Amanda [name] will identify internal and external stimuli that trigger PTSD symptoms (Progressing)     Start:  11/06/23    Expected End:  02/18/25       Goal Note     11/20/24: Patient is aware of triggers and able to identify coping skills.          Provide Amanda [name] with education on trauma-oriented therapy     Start:  11/06/23         Educate Amanda [name] as to the origins of PTSD, common symptoms, and how it impacts those affected by it     Start:  11/06/23         Educate Amanda [name] on common  reactions to a traumatic experience     Start:  11/06/23         Educate Amanda [name] that exposure to trauma may result in brain and hormonal changes that can lead to difficulties with memory, learning, emotional regulation, poor impulse control, or depression that can persist     Start:  11/06/23         Increase Amanda [name]'s confidence in coping with PTSD symptoms by assigning them to list at least two positive actions or small successes daily in a journal; process these success experiences     Start:  11/06/23         Encourage Amanda [name] to practice problem solving skills 2 times per week for the next 8 weeks     Start:  11/06/23         Amanda [name] Amanda [name] to identify triggers to feelings that are associated with the trauma by completing assigned exercises; process the material that is produced     Start:  11/06/23         Coping Skills      Start:  11/06/23       Will work with the pt using CBT/DBT techniques to help the pt verbalize an understanding of the cognitive, physiological, and behavioral components of  trauma sxs and its treatment. This will be done by using worksheets, interactive activities, CBT/ABC thought logs, modeling, homework, role playing and journaling. Will work with pt to learn and implement coping skills that result in a reduction of trauma sxs and improve daily functioning per pt self report 3 out of 5 documented sessions.          ProgressTowards Goals: Progressing  Interventions: CBT, Assertiveness Training, Supportive, and Reframing  Summary: Amanda [name] is a 28 y.o. female who presents with symptoms of trauma and anxiety. Patient identifies symptoms to include difficulty within interpersonal relationships, uncontrollable worry, hypervigilance, negative self affect, and irritability. Pt was oriented times 5. Pt was cooperative and engaged. Pt denies SI/HI/AVH.   The patient reflected on a recent situation where she practiced assertive communication to address a moment of conflict and misunderstanding. This situation triggered insecurities rooted in her childhood experiences. She explored her understanding of the emotions and insecurities that arose from her tendency to mind-read and personalize the reactions of others.  During the reflection, she recognized how these issues are particularly tied to her relationship with her father. The patient considered alternative perspectives and discussed how she can use assertive communication in the future to achieve mutual understanding, especially when dealing with others who emotionally withdraw and her own defensiveness.   Suicidal/Homicidal: Nowithout intent/plan    Therapist Response: Clinician utilized active and supportive reflection to create a safe space for patient to process recent life events. Clinician assessed for current symptoms, stressors, and safety since last session.   The clinician engaged the patient in role-playing various responses after she took on the perspectives of other individuals. Additionally, the  clinician provided psychoeducation on trauma responses, helping the patient understand how her reactions to situations often lead to feelings of guilt and shame, even in circumstances where she was not directly involved.   Plan: Return again in 2 weeks.   Diagnosis: PTSD (post-traumatic stress disorder)   GAD (generalized anxiety disorder)   MDD (major depressive disorder), recurrent, in full remission  Collaboration of Care: AEB psychiatrist can access notes and cln. Will review psychiatrists' notes. Check in with the patient and will see LCSW per availability. Patient agreed with treatment  recommendations.   Patient/Guardian was advised Release of Information must be obtained prior to any record release in order to collaborate their care with an outside provider. Patient/Guardian was advised if they have not already done so to contact the registration department to sign all necessary forms in order for us  to release information regarding their care.   Consent: Patient/Guardian gives verbal consent for treatment and assignment of benefits for services provided during this visit. Patient/Guardian expressed understanding and agreed to proceed.   Amanda KATHEE Husband, LCSW 12/04/2024

## 2024-12-05 ENCOUNTER — Ambulatory Visit: Payer: Self-pay | Admitting: Certified Nurse Midwife

## 2024-12-10 ENCOUNTER — Encounter: Payer: Self-pay | Admitting: Psychiatry

## 2024-12-10 ENCOUNTER — Telehealth: Admitting: Psychiatry

## 2024-12-10 DIAGNOSIS — G4701 Insomnia due to medical condition: Secondary | ICD-10-CM

## 2024-12-10 DIAGNOSIS — F3342 Major depressive disorder, recurrent, in full remission: Secondary | ICD-10-CM

## 2024-12-10 DIAGNOSIS — F411 Generalized anxiety disorder: Secondary | ICD-10-CM

## 2024-12-10 DIAGNOSIS — F4310 Post-traumatic stress disorder, unspecified: Secondary | ICD-10-CM | POA: Diagnosis not present

## 2024-12-10 NOTE — Progress Notes (Signed)
 Virtual Visit via Video Note  I connected with Amanda [name] on 12/10/2024 at  8:30 AM EST by a video enabled telemedicine application and verified that I am speaking with the correct person using two identifiers.  Location Provider Location : [facility] Patient Location : Home  Participants: Patient , Provider   I discussed the limitations of evaluation and management by telemedicine and the availability of in person appointments. The patient expressed understanding and agreed to proceed.   I discussed the assessment and treatment plan with the patient. The patient was provided an opportunity to ask questions and all were answered. The patient agreed with the plan and demonstrated an understanding of the instructions.   The patient was advised to call back or seek an in-person evaluation if the symptoms worsen or if the condition fails to improve as anticipated.   BH MD OP Progress Note  12/10/2024 8:53 AM Amanda [name]  MRN:  [id number]  Chief Complaint:  Chief Complaint  Patient presents with   Follow-up   Anxiety   Depression   Medication Refill   Discussed the use of AI scribe software for clinical note transcription with the patient, who gave verbal consent to proceed.  History of Present Illness Amanda [name] is a 28 year old, female, married, lives in [place], employed, has a history of GAD, MDD, insomnia was evaluated by telemedicine today for a follow-up appointment.  She reports increased feelings of being on edge and difficulty managing emotions have become more prominent for her recently, especially during the holiday season. She describes heightened stress and a sense of being overwhelmed, explaining that managing her emotions has been challenging even with her ongoing efforts. She identifies the holidays as a significant psychosocial stressor contributing to her current experiences.  She continues to take Lexapro  and Buspar  for anxiety, noting that these medications  remain helpful but perceives that their effectiveness does not last as long as before. She describes a sense that her body has become accustomed to the medications. The medications help reduce her sense of being overwhelmed, but only for a shorter duration. She takes Lexapro  and Buspar  in the morning and she had not been taking Buspar  in the evening.  She reports she has been noncompliant with the evening dosage however agrees to go back on it. She keeps hydroxyzine  available for use as needed for anxiety.  She continues to participate in therapy with Ms. Perkins. Recent therapy sessions have focused on processing issues from her childhood and the period following her separation from her husband about 1 to 1.5 years ago. She is working on different feelings about herself and coping with those emotions. She practices some mindfulness and relaxation techniques at home as her therapist recommended, though she finds it challenging to implement them consistently.  She denies any thoughts of harming herself or others. She reports sleep remains adequate. She notes that her home environment and relationship are currently going well.  Plans to visit family after work for the holidays and spend the following day at home.   Visit Diagnosis:    ICD-10-CM   1. GAD (generalized anxiety disorder)  F41.1     2. MDD (major depressive disorder), recurrent, in full remission  F33.42     3. PTSD (post-traumatic stress disorder)  F43.10     4. Insomnia due to medical condition  G47.01    Mood symptoms, children needing help, Lack of sleep hygiene      Past Psychiatric History: I have reviewed past  psychiatric history from progress note on 02/02/2022.  Past trials of Zoloft -did not work, Effexor -made her suicidal.  Past Medical History:  Past Medical History:  Diagnosis Date   Acne    Angio-edema    Anxiety    Back pain    BRCA negative 09/2020   MyRisk neg except AXIN2 VUS   Family history of adverse  reaction to anesthesia    heart condition   Family history of breast cancer 09/2020   IBIS=15.7%/riskscore=14.7%   Family history of pancreatic cancer    Family history of thyroid  disease    GERD (gastroesophageal reflux disease)    Lactose intolerance    Migraine    Multiple food allergies    Urticaria     Past Surgical History:  Procedure Laterality Date   NO PAST SURGERIES     TONSILLECTOMY Bilateral 09/05/2022   Procedure: TONSILLECTOMY;  Surgeon: Blair Mt, MD;  [facility];  Service: ENT;  Laterality: Bilateral;   TONSILLECTOMY     WISDOM TOOTH EXTRACTION      Family Psychiatric History: I have reviewed family psychiatric history from progress note on 02/02/2022.  Family History:  Family History  Problem Relation Age of Onset   Migraines Mother    Depression Mother    Anxiety disorder Mother    Allergic rhinitis Father    Skin cancer Father    Allergic rhinitis Brother    Allergies Brother    Asthma Maternal Grandmother    Hypertension Maternal Grandmother    Thyroid  disease Maternal Grandmother    Hypertension Maternal Grandfather    Pancreatic cancer Maternal Grandfather 53   Colon cancer Maternal Grandfather 60   Hypertension Paternal Grandmother    Diabetes Paternal Grandmother    Hypertension Paternal Grandfather    Diabetes Paternal Grandfather    Colon cancer Paternal Grandfather 29   Thyroid  disease Maternal Aunt    Breast cancer Maternal Aunt 62    Social History: I have reviewed social history from progress note on 02/02/2022. Social History   Socioeconomic History   Marital status: Married    Spouse name: Maylon Belfast   Number of children: 2   Years of education: Not on file   Highest education level: Associate degree: academic program  Occupational History   Occupation: Massage Therapy  Tobacco Use   Smoking status: Never    Passive exposure: Never   Smokeless tobacco: Never  Vaping Use   Vaping status: Never Used   Substance and Sexual Activity   Alcohol use: Never   Drug use: No   Sexual activity: Yes    Partners: Male    Birth control/protection: None  Other Topics Concern   Not on file  Social History Narrative   ** Merged History Encounter **       One story home Right-handed Caffeine: occasional coffee   Social Drivers of Health   Tobacco Use: Low Risk (12/10/2024)   Patient History    Smoking Tobacco Use: Never    Smokeless Tobacco Use: Never    Passive Exposure: Never  Financial Resource Strain: Medium Risk (01/10/2024)   Overall Financial Resource Strain (CARDIA)    Difficulty of Paying Living Expenses: Somewhat hard  Food Insecurity: Food Insecurity Present (01/10/2024)   Hunger Vital Sign    Worried About Running Out of Food in the Last Year: Sometimes true    Ran Out of Food in the Last Year: Never true  Transportation Needs: No Transportation Needs (01/10/2024)   PRAPARE -  Administrator, Civil Service (Medical): No    Lack of Transportation (Non-Medical): No  Physical Activity: Insufficiently Active (01/10/2024)   Exercise Vital Sign    Days of Exercise per Week: 4 days    Minutes of Exercise per Session: 30 min  Stress: Stress Concern Present (01/10/2024)   Amanda [name]    Feeling of Stress : To some extent  Social Connections: Unknown (01/10/2024)   Social Connection and Isolation Panel    Frequency of Communication with Friends and Family: Three times a week    Frequency of Social Gatherings with Friends and Family: Never    Attends Religious Services: Never    Database Administrator or Organizations: No    Attends Engineer, Structural: Not on file    Marital Status: Patient declined  Depression (PHQ2-9): Low Risk (08/26/2024)   Depression (PHQ2-9)    PHQ-2 Score: 0  Alcohol Screen: Low Risk (01/10/2024)   Alcohol Screen    Last Alcohol Screening Score (AUDIT): 2  Housing: Low Risk  (01/10/2024)   Housing Stability Vital Sign    Unable to Pay for Housing in the Last Year: No    Number of Times Moved in the Last Year: 0    Homeless in the Last Year: No  Utilities: Not on file  Health Literacy: Not on file    Allergies: Allergies[1]  Metabolic Disorder Labs: Lab Results  Component Value Date   HGBA1C 5.1 11/20/2024   No results found for: PROLACTIN Lab Results  Component Value Date   CHOL 127 05/05/2024   TRIG 62 05/05/2024   HDL 59 05/05/2024   CHOLHDL 2.2 05/05/2024   LDLCALC 55 05/05/2024   LDLCALC 69 06/28/2020   Lab Results  Component Value Date   TSH 1.540 05/05/2024   TSH 1.10 03/05/2024    Therapeutic Level Labs: No results found for: LITHIUM No results found for: VALPROATE No results found for: CBMZ  Current Medications: Current Outpatient Medications  Medication Sig Dispense Refill   busPIRone  (BUSPAR ) 10 MG tablet Take 2 tablets (20 mg total) by mouth 2 (two) times daily. 360 tablet 1   Clindamycin -Benzoyl Per, Refr, (DUAC) gel Apply to face qam, wash off qhs 45 g 3   escitalopram  (LEXAPRO ) 20 MG tablet TAKE 1 TABLET (20 MG TOTAL) BY MOUTH DAILY. DOSE INCREASE 90 tablet 1   hydrOXYzine  (VISTARIL ) 25 MG capsule TAKE 1-2 CAPSULES (25-50 MG TOTAL) BY MOUTH AT BEDTIME AS NEEDED. FOR ANXIETY AND SLEEP 180 capsule 0   levocetirizine (XYZAL ) 5 MG tablet Take 1 tablet (5 mg total) by mouth in the morning and at bedtime. 60 tablet 5   metFORMIN  (GLUCOPHAGE -XR) 500 MG 24 hr tablet Take 1 tablet (500 mg total) by mouth daily with lunch. 30 tablet 0   predniSONE  (STERAPRED UNI-PAK 21 TAB) 10 MG (21) TBPK tablet Take by mouth daily. Take 6 tabs by mouth daily  for 1 days, then 5 tabs for 1 days, then 4 tabs for 1 days, then 3 tabs for 1 days, 2 tabs for 1 days, then 1 tab by mouth daily for 1 days 21 tablet 0   tiZANidine  (ZANAFLEX ) 2 MG tablet Take 2 mg by mouth at bedtime.     topiramate  (TOPAMAX ) 25 MG tablet Take 1 tablet (25 mg total) by  mouth at bedtime. 30 tablet 0   traZODone  (DESYREL ) 50 MG tablet TAKE 1/2 TABLET BY MOUTH AT BEDTIME AS NEEDED  FOR SLEEP 45 tablet 0   tretinoin  (RETIN-A ) 0.1 % cream Apply topically at bedtime. 45 g 3   valACYclovir  (VALTREX ) 1000 MG tablet Take 1 tablet (1,000 mg total) by mouth daily. 90 tablet 2   Vitamin D , Ergocalciferol , (DRISDOL ) 1.25 MG (50000 UNIT) CAPS capsule Take 1 capsule (50,000 Units total) by mouth every 7 (seven) days. 4 capsule 1   Zavegepant HCl (ZAVZPRET ) 10 MG/ACT SOLN Place 1 spray into the nose daily as needed. Maximum 1 spray in 24 hours. 8 each 5   No current facility-administered medications for this visit.     Musculoskeletal: Strength & Muscle Tone: UTA Gait & Station: normal Patient leans: N/A  Psychiatric Specialty Exam: Review of Systems  Psychiatric/Behavioral:  The patient is nervous/anxious.     Last menstrual period 11/02/2024.There is no height or weight on file to calculate BMI.  General Appearance: Casual  Eye Contact:  Fair  Speech:  Clear and Coherent  Volume:  Normal  Mood:  Anxious  Affect:  Congruent  Thought Process:  Goal Directed and Descriptions of Associations: Intact  Orientation:  Full (Time, Place, and Person)  Thought Content: Logical   Suicidal Thoughts:  No  Homicidal Thoughts:  No  Memory:  Immediate;   Fair Recent;   Fair Remote;   Fair  Judgement:  Fair  Insight:  Fair  Psychomotor Activity:  Normal  Concentration:  Concentration: Fair and Attention Span: Fair  Recall:  [employer] of Knowledge: Fair  Language: Fair  Akathisia:  No  Handed:  Right  AIMS (if indicated): not done  Assets:  Communication Skills Desire for Improvement Housing Intimacy Social Support Transportation  ADL's:  Intact  Cognition: WNL  Sleep:  Fair   Screenings: AIMS    Flowsheet Row Video Visit from 07/21/2022 in [facility]  AIMS Total Score 0   GAD-7    Flowsheet Row Counselor from  08/26/2024 in [facility] Office Visit from 07/07/2024 in [facility] Counselor from 03/11/2024 in [facility] Counselor from 11/06/2023 in [facility] Office Visit from 10/08/2023 in [facility]  Total GAD-7 Score 3 5 2 16 13    PHQ2-9    Flowsheet Row Counselor from 08/26/2024 in [facility] Office Visit from 07/07/2024 in [facility] Office Visit from 05/05/2024 in [facility] from 03/11/2024 in [facility] Counselor from 11/06/2023 in [facility]  PHQ-2 Total Score 0 0 1 0 2  PHQ-9 Total Score -- -- 7 1 9    Flowsheet Row Video Visit from 12/10/2024 in [facility] Video Visit from 10/22/2024 in [facility] Video Visit from 10/06/2024 in [facility]  C-SSRS RISK CATEGORY No Risk No Risk Low Risk     Assessment and Plan: HARMONEE TOZER is a 28 year old female, married, presented for a follow-up appointment, discussed assessment and plan as noted below.  1. GAD (generalized anxiety disorder)-unstable Currently with anxiety mostly related to the holiday season, situational stressors.  Has been noncompliant with BuSpar  as prescribed agrees to go back on the BuSpar  as prescribed Continue Lexapro  20 mg daily Restart BuSpar  20 mg twice a day Continue CBT with Ms. Kristina  Perkins  2. MDD (major depressive disorder), recurrent, in full remission Denies any depression symptoms Continue Lexapro  and BuSpar  as prescribed  3. PTSD (post-traumatic stress  disorder)-improving Currently reports benefits from psychotherapy. Continue psychotherapy sessions.  4. Insomnia due to medical condition-improving Currently denies any sleep problems Continue Trazodone  25 mg at bedtime Continue Hydroxyzine  25 to 50 mg at bedtime as needed.  Follow-up Follow-up in clinic in 2 months or sooner if needed.  Collaboration of Care: Collaboration of Care: Referral or follow-up with counselor/therapist AEB I have reviewed notes per Ms. Perkins dated 12/04/2024-currently undergoing CBT, assertiveness training, supportive and reframing.  Patient/Guardian was advised Release of Information must be obtained prior to any record release in order to collaborate their care with an outside provider. Patient/Guardian was advised if they have not already done so to contact the registration department to sign all necessary forms in order for us  to release information regarding their care.   Consent: Patient/Guardian gives verbal consent for treatment and assignment of benefits for services provided during this visit. Patient/Guardian expressed understanding and agreed to proceed.   This note was generated in part or whole with voice recognition software. Voice recognition is usually quite accurate but there are transcription errors that can and very often do occur. I apologize for any typographical errors that were not detected and corrected.    Zaeden Lastinger, MD 12/10/2024, 8:53 AM     [1]  Allergies Allergen Reactions   Doxycycline  Other (See Comments)    Exacerbates Migraines   Imitrex  [Sumatriptan ] Swelling    Facial swelling and burning lips   Molds & Smuts    Pollen Extract-Tree Extract [Pollen Extract]    Shrimp Extract    Tylenol  [Acetaminophen ] Other (See Comments)    Causes rebound headaches    Penicillins Hives    Did it involve swelling of the face/tongue/throat, SOB, or low BP? No Did it involve sudden or severe rash/hives, skin peeling, or any  reaction on the inside of your mouth or nose? Yes Did you need to seek medical attention at a hospital or doctor's office? Yes When did it last happen?      childhood allergy  If all above answers are NO, may proceed with cephalosporin use.

## 2024-12-23 ENCOUNTER — Ambulatory Visit: Payer: Self-pay | Admitting: Licensed Clinical Social Worker

## 2024-12-23 ENCOUNTER — Telehealth: Payer: Self-pay | Admitting: Licensed Clinical Social Worker

## 2024-12-23 ENCOUNTER — Ambulatory Visit: Admitting: Licensed Clinical Social Worker

## 2024-12-23 NOTE — Telephone Encounter (Signed)
 Patient called the office asking to speak with the clinician.  Clinician returned the call approximately at 11:50 AM.  Clinician spoke with patient for 11 minutes regarding her feeling overwhelmed by negative thoughts about her role to be present for her kids is much as she would like due to increased work schedule related to the holiday season.  Patient was tearful and in distress. Denies HI or SI. Clinician briefly worked with patient to ground using the 5 senses exercise and 7-11 breathing.  From then, Cln was able to work with patient to reframe negative cognitions to focus on controllable factors and action steps she can take to focus on the present moment.  Clinician redirected patient to understand we can work towards problem solving and deeper restructuring of cognitions in her appointment on 12-24-2024.  Clinician directed patient if negative cognitions arise, she is instructed to write them down in the notes app on her phone to be addressed in her upcoming therapy appointment.

## 2024-12-24 ENCOUNTER — Ambulatory Visit: Admitting: Licensed Clinical Social Worker

## 2024-12-24 DIAGNOSIS — F411 Generalized anxiety disorder: Secondary | ICD-10-CM

## 2024-12-24 DIAGNOSIS — F3342 Major depressive disorder, recurrent, in full remission: Secondary | ICD-10-CM

## 2024-12-24 DIAGNOSIS — F4310 Post-traumatic stress disorder, unspecified: Secondary | ICD-10-CM | POA: Diagnosis not present

## 2024-12-24 NOTE — Progress Notes (Signed)
 "  THERAPIST PROGRESS NOTE  Virtual Visit via Video Note  I connected with Amanda [name] on 12/24/2024 at 11:00 AM EST by a video enabled telemedicine application and verified that I am speaking with the correct person using two identifiers.  Location: Patient: Address on file Provider: [facility]   I discussed the limitations of evaluation and management by telemedicine and the availability of in person appointments. The patient expressed understanding and agreed to proceed.    I discussed the assessment and treatment plan with the patient. The patient was provided an opportunity to ask questions and all were answered. The patient agreed with the plan and demonstrated an understanding of the instructions.   The patient was advised to call back or seek an in-person evaluation if the symptoms worsen or if the condition fails to improve as anticipated.  I provided 59 minutes of non-face-to-face time during this encounter.   Amanda KATHEE Husband, LCSW   Session Time: 11-11:59am  Participation Level: Active  Behavioral Response: DisheveledAlertNegative, Hopeless, and Tearful  Type of Therapy: Individual Therapy  Treatment Goals addressed: PTSD (post-traumatic stress disorder)  GAD (generalized anxiety disorder)  MDD (major depressive disorder), recurrent, in full remission   ProgressTowards Goals: Progressing  Interventions: CBT, Supportive, and Reframing  Summary: [name] is a 29 y.o. female who presents with symptoms of trauma and anxiety. Patient identifies symptoms to include difficulty within interpersonal relationships, uncontrollable worry, hypervigilance, negative self affect, and irritability. Pt was oriented times 5. Pt was cooperative and engaged. Pt denies SI/HI/AVH.    Pt reports she noticed a shift in her mood when her children left for the weekend. She reports feeling guilty about working too much due to financial stress and struggling with her role as a mother.  Cln assisted her in identifying new stressors in her life, such as illness and an increased workload, which contributed to her current depressive episode. She recognized that she does not handle change well and is particularly struggling with her kids graduating from pre-K and feeling like they are growing up faster than she would like.   She also mentioned comparing herself to other mothers on social media. We discussed strategies for limiting her social media exposure and addressed her fear that her children might hate her for setting boundaries.  She identified that her children falls into the secure attachment category.  Worked with patient to identify components of parenting that have led her children to become securely attached.  Explored ways in which patient age and self-care and recognize these feelings are temporary response to a disruption to her routine and month full of abnormal stressors.  Suicidal/Homicidal: Nowithout intent/plan  Therapist Response: Clinician utilized active and supportive reflection to create a safe space for patient to process recent life events. Clinician assessed for current symptoms, stressors, and safety since last session. We reframed her perspective to focus on how proud she is of herself. She completed a cognitive-behavioral therapy (CBT) exercise where she constructed a list of positive affirmations regarding her role as a mother. We processed these affirmations and explored ways she can incorporate them into her routine to boost her confidence.Provioded psychoeducation on attachment styles to help her understand developmentally appropriate behaviors.   Plan: Return again in 2 weeks.  Diagnosis: PTSD (post-traumatic stress disorder)  GAD (generalized anxiety disorder)  MDD (major depressive disorder), recurrent, in full remission   Collaboration of Care: AEB psychiatrist can access notes and cln. Will review psychiatrists' notes. Check in with the  patient and will see LCSW per availability. Patient agreed with treatment recommendations.   Patient/Guardian was advised Release of Information must be obtained prior to any record release in order to collaborate their care with an outside provider. Patient/Guardian was advised if they have not already done so to contact the registration department to sign all necessary forms in order for us  to release information regarding their care.   Consent: Patient/Guardian gives verbal consent for treatment and assignment of benefits for services provided during this visit. Patient/Guardian expressed understanding and agreed to proceed.   Amanda KATHEE Husband, LCSW 12/24/2024  "

## 2024-12-25 ENCOUNTER — Ambulatory Visit (INDEPENDENT_AMBULATORY_CARE_PROVIDER_SITE_OTHER): Admitting: Family Medicine

## 2024-12-25 ENCOUNTER — Ambulatory Visit: Admitting: Licensed Clinical Social Worker

## 2025-01-08 ENCOUNTER — Ambulatory Visit (INDEPENDENT_AMBULATORY_CARE_PROVIDER_SITE_OTHER): Admitting: Licensed Clinical Social Worker

## 2025-01-08 DIAGNOSIS — F411 Generalized anxiety disorder: Secondary | ICD-10-CM | POA: Diagnosis not present

## 2025-01-08 DIAGNOSIS — F3342 Major depressive disorder, recurrent, in full remission: Secondary | ICD-10-CM | POA: Diagnosis not present

## 2025-01-08 DIAGNOSIS — F4310 Post-traumatic stress disorder, unspecified: Secondary | ICD-10-CM

## 2025-01-08 NOTE — Progress Notes (Signed)
 "  THERAPIST PROGRESS NOTE  Session Time: 10-11  Participation Level: Active  Behavioral Response: CasualAlertAnxious  Type of Therapy: Individual Therapy  Treatment Goals addressed:  Active     Anxiety     LTG: Amanda [name] will score less than 5 on the Generalized Anxiety Disorder 7 Scale (GAD-7)  (Progressing)     Start:  11/06/23    Expected End:  02/18/25       Goal Note     11/20/24: Patient reports she is aware of new triggers following a recent panic attack. Patient reports she was able to remove herself and regulate through breathing techniques.           STG: Amanda [name] will practice problem solving skills 3 times per week for the next 4 weeks.  (Progressing)     Start:  11/06/23    Expected End:  02/18/25         Work with Amanda [name] to track symptoms, triggers, and/or skill use through a mood chart, diary card, or journal     Start:  11/06/23         Perform psychoeducation regarding anxiety disorders     Start:  11/06/23         Coping Skills      Start:  11/06/23       Will work with the pt using CBT/DBT techniques to help the pt verbalize an understanding of the cognitive, physiological, and behavioral components of anxiety and its treatment. This will be done by using worksheets, interactive activities, CBT/ABC thought logs, modeling, homework, role playing and journaling. Will work with pt to learn and implement coping skills that result in a reduction of anxiety and improve daily functioning per pt self report 3 out of 5 documented sessions.         BH CCP Acute or Chronic Trauma Reaction     LTG: Recall traumatic events without becoming overwhelmed with negative emotions (Progressing)     Start:  11/06/23    Expected End:  02/18/25         STG: Amanda [name] will practice emotion regulation skills 2 time(s) per week for the next 8 week(s) (Progressing)     Start:  11/06/23    Expected End:  02/18/25       Goal Note     11/20/24: Patient reports Improvement  in her ability to refrain from engaging in unhealthy coping mechanisms due to emotional dysregulation. Reports she has utilized assertive communication and coping skills.         LTG: Pt reports she would like to Work through all the trauma I experienced through the breakup and with the other person (Progressing)     Start:  11/06/23    Expected End:  02/18/25         STG: Amanda [name] will identify internal and external stimuli that trigger PTSD symptoms (Progressing)     Start:  11/06/23    Expected End:  02/18/25       Goal Note     11/20/24: Patient is aware of triggers and able to identify coping skills.          Provide Amanda [name] with education on trauma-oriented therapy     Start:  11/06/23         Educate Amanda [name] as to the origins of PTSD, common symptoms, and how it impacts those affected by it     Start:  11/06/23         Educate Amanda [name] on common reactions  to a traumatic experience     Start:  11/06/23         Educate Amanda [name] that exposure to trauma may result in brain and hormonal changes that can lead to difficulties with memory, learning, emotional regulation, poor impulse control, or depression that can persist     Start:  11/06/23         Increase Amanda [name]'s confidence in coping with PTSD symptoms by assigning them to list at least two positive actions or small successes daily in a journal; process these success experiences     Start:  11/06/23         Encourage Amanda [name] to practice problem solving skills 2 times per week for the next 8 weeks     Start:  11/06/23         Amanda [name] Amanda [name] to identify triggers to feelings that are associated with the trauma by completing assigned exercises; process the material that is produced     Start:  11/06/23         Coping Skills      Start:  11/06/23       Will work with the pt using CBT/DBT techniques to help the pt verbalize an understanding of the cognitive, physiological, and behavioral components of trauma sxs and its  treatment. This will be done by using worksheets, interactive activities, CBT/ABC thought logs, modeling, homework, role playing and journaling. Will work with pt to learn and implement coping skills that result in a reduction of trauma sxs and improve daily functioning per pt self report 3 out of 5 documented sessions.          ProgressTowards Goals: Progressing   Interventions: Supportive, and her child work   Summary: Amanda [name] is a 29 y.o. female who presents with symptoms of trauma and anxiety. Patient identifies symptoms to include difficulty within interpersonal relationships, uncontrollable worry, hypervigilance, negative self affect, and irritability. Pt was oriented times 5. Pt was cooperative and engaged. Pt denies SI/HI/AVH.   The patient reports having recently come out of a depressive episode that lasted for a few days. This episode was characterized by bouts of tearfulness, a negative mood, negative self-perception, anhedonia, and uncontrollable worry. The patient expressed feelings of numbness and reflected that she felt she was at her peak of depression during her last session. We discussed alternative perspectives and boundaries she is establishing to maintain self-care.  The patient also shared feelings of unhappiness due to difficulties coping with upcoming changes, particularly an impending move that has prompted her to question herself. The clinician and the patient explored the root of these feelings, with the patient reflecting on a series of changes in her childhood that contributed to feelings of lack of control.   She reported feeling scared and cautious during her childhood, and expressed that she did not develop a strong attachment to her mother due to a lack of affection. This experience, compounded by her mother's substance use and the rejection of her emotional needs, led the clinician and the patient to engage in inner child work.  As a child, the patient  frequently experienced headaches, tearfulness, loneliness, feelings of being out of control, shaking, and hypervigilance. The patient became tearful while reflecting on ways to manage her own emotions and those of her inner child, as well as how to nurture and re-parent her inner child.  Suicidal/Homicidal: Nowithout intent/plan   Therapist Response: Clinician utilized active and supportive reflection to create a safe space for patient to process  recent life events. Clinician assessed for current symptoms, stressors, and safety since last session.  Engaged patient in psychoeducation around her and her child and how attachment is currently manifesting itself based on present stressors.  Explored ways in which patient can actively engage in inner child work and self-care during this transition in an effort to maintain symptoms of anxiety and depression.   Plan: Return again in 2 weeks.   Diagnosis: PTSD (post-traumatic stress disorder)   GAD (generalized anxiety disorder)   MDD (major depressive disorder), recurrent, in full remission     Collaboration of Care: AEB psychiatrist can access notes and cln. Will review psychiatrists' notes. Check in with the patient and will see LCSW per availability. Patient agreed with treatment recommendations.   Patient/Guardian was advised Release of Information must be obtained prior to any record release in order to collaborate their care with an outside provider. Patient/Guardian was advised if they have not already done so to contact the registration department to sign all necessary forms in order for us  to release information regarding their care.   Consent: Patient/Guardian gives verbal consent for treatment and assignment of benefits for services provided during this visit. Patient/Guardian expressed understanding and agreed to proceed.   [name] Husband, LCSW 01/08/2025  "

## 2025-01-15 ENCOUNTER — Ambulatory Visit

## 2025-01-15 ENCOUNTER — Encounter

## 2025-01-15 VITALS — BP 103/63 | HR 86 | Temp 97.3°F | Ht 63.0 in | Wt 182.7 lb

## 2025-01-15 DIAGNOSIS — E010 Iodine-deficiency related diffuse (endemic) goiter: Secondary | ICD-10-CM | POA: Diagnosis not present

## 2025-01-15 DIAGNOSIS — F411 Generalized anxiety disorder: Secondary | ICD-10-CM | POA: Diagnosis not present

## 2025-01-15 DIAGNOSIS — E669 Obesity, unspecified: Secondary | ICD-10-CM | POA: Diagnosis not present

## 2025-01-15 NOTE — Progress Notes (Signed)
 "   New patient visit   Patient: Amanda [name]   DOB: 01/03/1996   28 y.o. Female  MRN: [id number] Visit Date: 01/15/2025  Today's healthcare provider: Isaiah DELENA Pepper, MD   Chief Complaint  Patient presents with   Establish Care   Subjective    Amanda [name] is a 29 y.o. female who presents today as a new patient to establish care.   Discussed the use of AI scribe software for clinical note transcription with the patient, who gave verbal consent to proceed.  History of Present Illness Amanda [name] is a 29 year old female who presents for establish of care and monitoring of thyroid  function.  She has a history of thyroid  enlargement, with normal thyroid  labs, but wishes to continue monitoring due to a family history of thyroid  disorders, including Graves' disease and Hashimoto's thyroiditis on her mother's side. An ultrasound in 2023 showed a mildly enlarged thyroid  without nodules, resembling a goiter. She is concerned about potential growth and genetic implications, as her grandmother and cousin have thyroid  conditions.  She has experienced weight gain, which she attributes to potential thyroid  issues. She was previously informed about being on the verge of prediabetes due to insulin  resistance, although her A1c levels have remained normal. She was prescribed metformin , which caused significant gastrointestinal distress, including diarrhea and nausea, leading to its discontinuation. She also tried Topamax  for weight loss, which she had previously used for migraines, now managed by a chiropractor.  Her current medications include Lexapro  and buspirone  for anxiety, which she believes contribute to weight gain. She is concerned about her weight trajectory, especially given her family history of obesity-related health issues. She finds following a strict diet stressful and counterproductive, leading to further weight gain. She is seeking assistance with weight management and dietary  planning.  She lives a busy lifestyle with children and work responsibilities, impacting her ability to adhere to strict dietary regimens.      Past Medical History:  Diagnosis Date   Acne    Angio-edema    Anxiety    Back pain    BRCA negative 09/2020   MyRisk neg except AXIN2 VUS   Family history of adverse reaction to anesthesia    heart condition   Family history of breast cancer 09/2020   IBIS=15.7%/riskscore=14.7%   Family history of pancreatic cancer    Family history of thyroid  disease    GERD (gastroesophageal reflux disease)    Lactose intolerance    Migraine    Multiple food allergies    Urticaria    Past Surgical History:  Procedure Laterality Date   NO PAST SURGERIES     TONSILLECTOMY Bilateral 09/05/2022   Procedure: TONSILLECTOMY;  Surgeon: Blair Mt, MD;  Location: [facility];  Service: ENT;  Laterality: Bilateral;   TONSILLECTOMY     WISDOM TOOTH EXTRACTION     Family Status  Relation Name Status   Mother  Alive   Father  Alive   Brother  Alive   MGM  Alive   MGF  Deceased   PGM  Alive   PGF  Alive   Daughter  Alive   Daughter  Alive   Mat Aunt  Alive  No partnership data on file   Family History  Problem Relation Age of Onset   Diabetes Mother    Migraines Mother    Depression Mother    Anxiety disorder Mother    Allergic rhinitis Father    Skin cancer Father  Allergic rhinitis Brother    Allergies Brother    Asthma Maternal Grandmother    Hypertension Maternal Grandmother    Thyroid  disease Maternal Grandmother    Hypothyroidism Maternal Grandmother    Cancer Maternal Grandfather    Hypertension Maternal Grandfather    Pancreatic cancer Maternal Grandfather 55   Colon cancer Maternal Grandfather 60   Hypertension Paternal Grandmother    Diabetes Paternal Grandmother    Heart Problems Paternal Grandmother    Skin cancer Paternal Grandfather    Hypertension Paternal Grandfather    Diabetes Paternal Grandfather     Colon cancer Paternal Grandfather 54   Thyroid  disease Maternal Aunt    Breast cancer Maternal Aunt 35   Social History   Socioeconomic History   Marital status: Married    Spouse name: Maylon Belfast   Number of children: 2   Years of education: Not on file   Highest education level: Associate degree: academic program  Occupational History   Occupation: Massage Therapy  Tobacco Use   Smoking status: Never    Passive exposure: Never   Smokeless tobacco: Never  Vaping Use   Vaping status: Never Used  Substance and Sexual Activity   Alcohol use: Never   Drug use: No   Sexual activity: Yes    Partners: Male    Birth control/protection: None  Other Topics Concern   Not on file  Social History Narrative   ** Merged History Encounter **       One story home Right-handed Caffeine: occasional coffee   Social Drivers of Health   Tobacco Use: Low Risk (01/15/2025)   Patient History    Smoking Tobacco Use: Never    Smokeless Tobacco Use: Never    Passive Exposure: Never  Financial Resource Strain: Medium Risk (01/10/2024)   Overall Financial Resource Strain (CARDIA)    Difficulty of Paying Living Expenses: Somewhat hard  Food Insecurity: Food Insecurity Present (01/10/2024)   Hunger Vital Sign    Worried About Running Out of Food in the Last Year: Sometimes true    Ran Out of Food in the Last Year: Never true  Transportation Needs: No Transportation Needs (01/10/2024)   PRAPARE - Administrator, Civil Service (Medical): No    Lack of Transportation (Non-Medical): No  Physical Activity: Insufficiently Active (01/10/2024)   Exercise Vital Sign    Days of Exercise per Week: 4 days    Minutes of Exercise per Session: 30 min  Stress: Stress Concern Present (01/10/2024)   [employer] of Occupational Health - Occupational Stress Questionnaire    Feeling of Stress : To some extent  Social Connections: Unknown (01/10/2024)   Social Connection and Isolation Panel     Frequency of Communication with Friends and Family: Three times a week    Frequency of Social Gatherings with Friends and Family: Never    Attends Religious Services: Never    Database Administrator or Organizations: No    Attends Engineer, Structural: Not on file    Marital Status: Patient declined  Depression (PHQ2-9): Medium Risk (01/15/2025)   Depression (PHQ2-9)    PHQ-2 Score: 8  Alcohol Screen: Low Risk (01/10/2024)   Alcohol Screen    Last Alcohol Screening Score (AUDIT): 2  Housing: Low Risk (01/10/2024)   Housing Stability Vital Sign    Unable to Pay for Housing in the Last Year: No    Number of Times Moved in the Last Year: 0    Homeless in  the Last Year: No  Utilities: Not on file  Health Literacy: Not on file   Show/hide medication list[1] Allergies[2]  Reviews of Systems as noted in HPI.       Objective    BP 103/63   Pulse 86   Temp (!) 97.3 F (36.3 C) (Oral) Comment: drinking a cold drink  Ht 5' 3 (1.6 m)   Wt 182 lb 11.2 oz (82.9 kg)   LMP 12/18/2024   SpO2 100%   BMI 32.36 kg/m      Physical Exam Constitutional:      Appearance: Normal appearance.  HENT:     Head: Normocephalic and atraumatic.     Mouth/Throat:     Mouth: Mucous membranes are moist.  Eyes:     Pupils: Pupils are equal, round, and reactive to light.  Neck:     Thyroid : Thyromegaly present. No thyroid  mass or thyroid  tenderness.  Pulmonary:     Effort: Pulmonary effort is normal.  Skin:    General: Skin is warm.  Neurological:     General: No focal deficit present.     Mental Status: She is alert.     Depression Screen    01/15/2025   10:32 AM 08/26/2024    8:05 AM 07/07/2024    9:33 AM 05/05/2024   11:03 AM  PHQ 2/9 Scores  PHQ - 2 Score 2   1  PHQ- 9 Score 8   7      Information is confidential and restricted. Go to Review Flowsheets to unlock data.   Data saved with a previous flowsheet row definition   No results found for any visits on 01/15/25.   Assessment & Plan      Problem List Items Addressed This Visit       Endocrine   Thyromegaly - Primary   Relevant Orders   TSH + free T4   US  THYROID      Other   GAD (generalized anxiety disorder)   Chronic, controlled. Continue lexapro  and buspar . Follow up with psychiatry.      Other Visit Diagnoses       Obesity (BMI 30-39.9)       Relevant Orders   Referral to Nutrition and Diabetes Services      Assessment & Plan Thyromegaly Mildly enlarged thyroid  without nodules on previous ultrasound in 2023. Family history of thyroid  disorders including Graves' disease and Hashimoto's thyroiditis. Concerns about potential weight gain related to thyroid  function. - Ordered thyroid  ultrasound to assess for nodules and changes in size. - Rechecked thyroid  function tests today.  Obesity BMI 32. Previous attempts at weight loss with medications like metformin  and Topamax  were not well tolerated due to side effects. Current labs show normal A1c and cholesterol levels, indicating no prediabetes or hyperlipidemia. Current lifestyle includes regular exercise and mindful eating. - Referred to a dietitian for meal planning and nutritional guidance. - Encouraged continuation of regular exercise and mindful eating. - Discussed potential weight loss medications if desired, but will likely not be covered by insurance   No follow-ups on file.      Isaiah DELENA Pepper, MD  [facility] [phone] (phone) [phone] (fax)     [1]  Outpatient Medications Prior to Visit  Medication Sig   busPIRone  (BUSPAR ) 10 MG tablet Take 2 tablets (20 mg total) by mouth 2 (two) times daily.   escitalopram  (LEXAPRO ) 20 MG tablet TAKE 1 TABLET (20 MG TOTAL) BY MOUTH DAILY. DOSE INCREASE   hydrOXYzine  (  VISTARIL ) 25 MG capsule TAKE 1-2 CAPSULES (25-50 MG TOTAL) BY MOUTH AT BEDTIME AS NEEDED. FOR ANXIETY AND SLEEP   tretinoin  (RETIN-A ) 0.1 % cream Apply topically at bedtime.    valACYclovir  (VALTREX ) 1000 MG tablet Take 1 tablet (1,000 mg total) by mouth daily.   Vitamin D , Ergocalciferol , (DRISDOL ) 1.25 MG (50000 UNIT) CAPS capsule Take 1 capsule (50,000 Units total) by mouth every 7 (seven) days.   [DISCONTINUED] traZODone  (DESYREL ) 50 MG tablet TAKE 1/2 TABLET BY MOUTH AT BEDTIME AS NEEDED FOR SLEEP (Patient taking differently: Take 50 mg by mouth as needed.)   [DISCONTINUED] Clindamycin -Benzoyl Per, Refr, (DUAC) gel Apply to face qam, wash off qhs (Patient not taking: Reported on 01/15/2025)   [DISCONTINUED] levocetirizine (XYZAL ) 5 MG tablet Take 1 tablet (5 mg total) by mouth in the morning and at bedtime. (Patient not taking: Reported on 01/15/2025)   [DISCONTINUED] metFORMIN  (GLUCOPHAGE -XR) 500 MG 24 hr tablet Take 1 tablet (500 mg total) by mouth daily with lunch. (Patient not taking: Reported on 01/15/2025)   [DISCONTINUED] predniSONE  (STERAPRED UNI-PAK 21 TAB) 10 MG (21) TBPK tablet Take by mouth daily. Take 6 tabs by mouth daily  for 1 days, then 5 tabs for 1 days, then 4 tabs for 1 days, then 3 tabs for 1 days, 2 tabs for 1 days, then 1 tab by mouth daily for 1 days (Patient not taking: Reported on 01/15/2025)   [DISCONTINUED] tiZANidine  (ZANAFLEX ) 2 MG tablet Take 2 mg by mouth at bedtime. (Patient not taking: Reported on 01/15/2025)   [DISCONTINUED] topiramate  (TOPAMAX ) 25 MG tablet Take 1 tablet (25 mg total) by mouth at bedtime. (Patient not taking: Reported on 01/15/2025)   [DISCONTINUED] Zavegepant HCl (ZAVZPRET ) 10 MG/ACT SOLN Place 1 spray into the nose daily as needed. Maximum 1 spray in 24 hours. (Patient not taking: Reported on 01/15/2025)   No facility-administered medications prior to visit.  [2]  Allergies Allergen Reactions   Doxycycline  Other (See Comments)    Exacerbates Migraines   Imitrex  [Sumatriptan ] Swelling    Facial swelling and burning lips   Molds & Smuts    Pollen Extract-Tree Extract [Pollen Extract]    Shrimp Extract    Tylenol   [Acetaminophen ] Other (See Comments)    Causes rebound headaches    Penicillins Hives    Did it involve swelling of the face/tongue/throat, SOB, or low BP? No Did it involve sudden or severe rash/hives, skin peeling, or any reaction on the inside of your mouth or nose? Yes Did you need to seek medical attention at a hospital or doctor's office? Yes When did it last happen?      childhood allergy  If all above answers are NO, may proceed with cephalosporin use.   "

## 2025-01-15 NOTE — Assessment & Plan Note (Signed)
 Chronic, controlled. Continue lexapro  and buspar . Follow up with psychiatry.

## 2025-01-16 ENCOUNTER — Ambulatory Visit: Payer: Self-pay

## 2025-01-16 LAB — TSH+FREE T4
Free T4: 1.23 ng/dL (ref 0.82–1.77)
TSH: 1.05 u[IU]/mL (ref 0.450–4.500)

## 2025-01-22 ENCOUNTER — Ambulatory Visit: Admitting: Licensed Clinical Social Worker

## 2025-01-22 ENCOUNTER — Ambulatory Visit (INDEPENDENT_AMBULATORY_CARE_PROVIDER_SITE_OTHER): Admitting: Family Medicine

## 2025-01-22 ENCOUNTER — Ambulatory Visit: Admission: RE | Admit: 2025-01-22 | Discharge: 2025-01-22

## 2025-01-22 DIAGNOSIS — E010 Iodine-deficiency related diffuse (endemic) goiter: Secondary | ICD-10-CM

## 2025-01-23 ENCOUNTER — Other Ambulatory Visit: Payer: Self-pay

## 2025-02-05 ENCOUNTER — Ambulatory Visit: Admitting: Licensed Clinical Social Worker

## 2025-02-09 ENCOUNTER — Telehealth: Admitting: Psychiatry

## 2025-02-19 ENCOUNTER — Ambulatory Visit: Admitting: Licensed Clinical Social Worker
